# Patient Record
Sex: Male | Born: 1960 | Race: Asian | Hispanic: No | Marital: Single | Smoking: Former smoker
Health system: Southern US, Community
[De-identification: ages and names within clinical notes are randomized; demographics above are authoritative.]

## PROBLEM LIST (undated history)

## (undated) DIAGNOSIS — I77819 Aortic ectasia, unspecified site: Secondary | ICD-10-CM

## (undated) DIAGNOSIS — I428 Other cardiomyopathies: Secondary | ICD-10-CM

## (undated) DIAGNOSIS — M5126 Other intervertebral disc displacement, lumbar region: Secondary | ICD-10-CM

## (undated) DIAGNOSIS — D649 Anemia, unspecified: Secondary | ICD-10-CM

## (undated) DIAGNOSIS — M5416 Radiculopathy, lumbar region: Secondary | ICD-10-CM

## (undated) DIAGNOSIS — M545 Low back pain, unspecified: Secondary | ICD-10-CM

## (undated) DIAGNOSIS — Z9181 History of falling: Secondary | ICD-10-CM

## (undated) DIAGNOSIS — I5030 Unspecified diastolic (congestive) heart failure: Secondary | ICD-10-CM

## (undated) DIAGNOSIS — I10 Essential (primary) hypertension: Secondary | ICD-10-CM

## (undated) DIAGNOSIS — I4891 Unspecified atrial fibrillation: Secondary | ICD-10-CM

## (undated) DIAGNOSIS — R42 Dizziness and giddiness: Secondary | ICD-10-CM

## (undated) HISTORY — DX: Radiculopathy, lumbar region: M54.16

## (undated) HISTORY — DX: Other intervertebral disc displacement, lumbar region: M51.26

## (undated) HISTORY — DX: History of falling: Z91.81

## (undated) HISTORY — DX: Dizziness and giddiness: R42

## (undated) HISTORY — DX: Unspecified diastolic (congestive) heart failure: I50.30

## (undated) HISTORY — DX: Aortic ectasia, unspecified site: I77.819

## (undated) HISTORY — DX: Other cardiomyopathies: I42.8

---
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
## 2013-04-12 DIAGNOSIS — I872 Venous insufficiency (chronic) (peripheral): Secondary | ICD-10-CM | POA: Diagnosis present

## 2013-04-12 DIAGNOSIS — M545 Low back pain, unspecified: Secondary | ICD-10-CM | POA: Insufficient documentation

## 2013-04-12 DIAGNOSIS — M5442 Lumbago with sciatica, left side: Secondary | ICD-10-CM | POA: Insufficient documentation

## 2013-04-18 DIAGNOSIS — L209 Atopic dermatitis, unspecified: Secondary | ICD-10-CM | POA: Insufficient documentation

## 2013-07-12 DIAGNOSIS — Z9889 Other specified postprocedural states: Secondary | ICD-10-CM | POA: Insufficient documentation

## 2013-07-12 HISTORY — PX: COLONOSCOPY: SHX174

## 2014-02-11 DIAGNOSIS — M7710 Lateral epicondylitis, unspecified elbow: Secondary | ICD-10-CM | POA: Insufficient documentation

## 2015-06-27 DIAGNOSIS — D649 Anemia, unspecified: Secondary | ICD-10-CM | POA: Insufficient documentation

## 2017-11-23 DIAGNOSIS — Z8619 Personal history of other infectious and parasitic diseases: Secondary | ICD-10-CM | POA: Insufficient documentation

## 2018-03-20 DIAGNOSIS — N481 Balanitis: Secondary | ICD-10-CM | POA: Insufficient documentation

## 2018-03-31 DIAGNOSIS — G4726 Circadian rhythm sleep disorder, shift work type: Secondary | ICD-10-CM | POA: Insufficient documentation

## 2018-03-31 DIAGNOSIS — Z720 Tobacco use: Secondary | ICD-10-CM | POA: Insufficient documentation

## 2019-03-12 DIAGNOSIS — M62838 Other muscle spasm: Secondary | ICD-10-CM | POA: Insufficient documentation

## 2019-03-12 DIAGNOSIS — I4891 Unspecified atrial fibrillation: Secondary | ICD-10-CM | POA: Insufficient documentation

## 2019-03-12 DIAGNOSIS — R609 Edema, unspecified: Secondary | ICD-10-CM | POA: Insufficient documentation

## 2019-03-12 DIAGNOSIS — R600 Localized edema: Secondary | ICD-10-CM | POA: Insufficient documentation

## 2019-03-12 DIAGNOSIS — L853 Xerosis cutis: Secondary | ICD-10-CM | POA: Insufficient documentation

## 2019-03-12 DIAGNOSIS — Z7901 Long term (current) use of anticoagulants: Secondary | ICD-10-CM | POA: Insufficient documentation

## 2019-05-07 ENCOUNTER — Inpatient Hospital Stay (HOSPITAL_COMMUNITY): Payer: Self-pay

## 2019-05-07 ENCOUNTER — Other Ambulatory Visit: Payer: Self-pay

## 2019-05-07 ENCOUNTER — Inpatient Hospital Stay (HOSPITAL_COMMUNITY)
Admission: EM | Admit: 2019-05-07 | Discharge: 2019-05-10 | DRG: 308 | Disposition: A | Discharge status: Home / Self Care | Payer: Self-pay | Attending: Internal Medicine | Admitting: Internal Medicine

## 2019-05-07 ENCOUNTER — Encounter (HOSPITAL_COMMUNITY): Payer: Self-pay | Admitting: Emergency Medicine

## 2019-05-07 ENCOUNTER — Emergency Department (HOSPITAL_COMMUNITY): Payer: Self-pay

## 2019-05-07 DIAGNOSIS — R778 Other specified abnormalities of plasma proteins: Secondary | ICD-10-CM

## 2019-05-07 DIAGNOSIS — N179 Acute kidney failure, unspecified: Secondary | ICD-10-CM | POA: Diagnosis present

## 2019-05-07 DIAGNOSIS — Z7982 Long term (current) use of aspirin: Secondary | ICD-10-CM

## 2019-05-07 DIAGNOSIS — R7989 Other specified abnormal findings of blood chemistry: Secondary | ICD-10-CM

## 2019-05-07 DIAGNOSIS — E039 Hypothyroidism, unspecified: Secondary | ICD-10-CM | POA: Diagnosis present

## 2019-05-07 DIAGNOSIS — M542 Cervicalgia: Secondary | ICD-10-CM

## 2019-05-07 DIAGNOSIS — Z833 Family history of diabetes mellitus: Secondary | ICD-10-CM

## 2019-05-07 DIAGNOSIS — Z7901 Long term (current) use of anticoagulants: Secondary | ICD-10-CM

## 2019-05-07 DIAGNOSIS — G629 Polyneuropathy, unspecified: Secondary | ICD-10-CM | POA: Diagnosis present

## 2019-05-07 DIAGNOSIS — Z20822 Contact with and (suspected) exposure to covid-19: Secondary | ICD-10-CM | POA: Diagnosis present

## 2019-05-07 DIAGNOSIS — I872 Venous insufficiency (chronic) (peripheral): Secondary | ICD-10-CM | POA: Diagnosis present

## 2019-05-07 DIAGNOSIS — I248 Other forms of acute ischemic heart disease: Secondary | ICD-10-CM | POA: Diagnosis present

## 2019-05-07 DIAGNOSIS — I110 Hypertensive heart disease with heart failure: Secondary | ICD-10-CM | POA: Diagnosis present

## 2019-05-07 DIAGNOSIS — E785 Hyperlipidemia, unspecified: Secondary | ICD-10-CM | POA: Diagnosis present

## 2019-05-07 DIAGNOSIS — I10 Essential (primary) hypertension: Secondary | ICD-10-CM

## 2019-05-07 DIAGNOSIS — I4891 Unspecified atrial fibrillation: Secondary | ICD-10-CM | POA: Diagnosis present

## 2019-05-07 DIAGNOSIS — I5042 Chronic combined systolic (congestive) and diastolic (congestive) heart failure: Secondary | ICD-10-CM

## 2019-05-07 DIAGNOSIS — I480 Paroxysmal atrial fibrillation: Principal | ICD-10-CM | POA: Diagnosis present

## 2019-05-07 DIAGNOSIS — F17200 Nicotine dependence, unspecified, uncomplicated: Secondary | ICD-10-CM | POA: Diagnosis present

## 2019-05-07 DIAGNOSIS — J810 Acute pulmonary edema: Secondary | ICD-10-CM

## 2019-05-07 DIAGNOSIS — I340 Nonrheumatic mitral (valve) insufficiency: Secondary | ICD-10-CM

## 2019-05-07 DIAGNOSIS — Z9114 Patient's other noncompliance with medication regimen: Secondary | ICD-10-CM

## 2019-05-07 DIAGNOSIS — I4892 Unspecified atrial flutter: Secondary | ICD-10-CM | POA: Diagnosis present

## 2019-05-07 DIAGNOSIS — I361 Nonrheumatic tricuspid (valve) insufficiency: Secondary | ICD-10-CM

## 2019-05-07 DIAGNOSIS — I351 Nonrheumatic aortic (valve) insufficiency: Secondary | ICD-10-CM

## 2019-05-07 DIAGNOSIS — M25519 Pain in unspecified shoulder: Secondary | ICD-10-CM

## 2019-05-07 DIAGNOSIS — I5043 Acute on chronic combined systolic (congestive) and diastolic (congestive) heart failure: Secondary | ICD-10-CM | POA: Diagnosis present

## 2019-05-07 DIAGNOSIS — E876 Hypokalemia: Secondary | ICD-10-CM | POA: Diagnosis present

## 2019-05-07 HISTORY — DX: Essential (primary) hypertension: I10

## 2019-05-07 HISTORY — DX: Anemia, unspecified: D64.9

## 2019-05-07 HISTORY — DX: Unspecified atrial fibrillation: I48.91

## 2019-05-07 HISTORY — DX: Low back pain, unspecified: M54.50

## 2019-05-07 LAB — BASIC METABOLIC PANEL
Anion gap: 13 (ref 5–15)
BUN: 14 mg/dL (ref 6–20)
CO2: 18 mmol/L — ABNORMAL LOW (ref 22–32)
Calcium: 9.1 mg/dL (ref 8.9–10.3)
Chloride: 108 mmol/L (ref 98–111)
Creatinine, Ser: 1.1 mg/dL (ref 0.61–1.24)
GFR calc Af Amer: >60 mL/min (ref >60)
GFR calc non Af Amer: >60 mL/min (ref >60)
Glucose, Bld: 97 mg/dL (ref 70–99)
Potassium: 3.9 mmol/L (ref 3.5–5.1)
Sodium: 139 mmol/L (ref 135–145)

## 2019-05-07 LAB — TSH: TSH: 12.771 u[IU]/mL — ABNORMAL HIGH (ref 0.350–4.500)

## 2019-05-07 LAB — CBC
HCT: 41.1 % (ref 39.0–52.0)
Hemoglobin: 13.3 g/dL (ref 13.0–17.0)
MCH: 27.3 pg (ref 26.0–34.0)
MCHC: 32.4 g/dL (ref 30.0–36.0)
MCV: 84.2 fL (ref 80.0–100.0)
Platelets: 167 10*3/uL (ref 150–400)
RBC: 4.88 MIL/uL (ref 4.22–5.81)
RDW: 15.3 % (ref 11.5–15.5)
WBC: 9.2 10*3/uL (ref 4.0–10.5)
nRBC: 0 % (ref 0.0–0.2)

## 2019-05-07 LAB — BRAIN NATRIURETIC PEPTIDE: B Natriuretic Peptide: 733.8 pg/mL — ABNORMAL HIGH (ref 0.0–100.0)

## 2019-05-07 LAB — LIPID PANEL
Cholesterol: 163 mg/dL (ref 0–200)
HDL: 58 mg/dL (ref >40)
LDL Cholesterol: 88 mg/dL (ref 0–99)
Total CHOL/HDL Ratio: 2.8 RATIO
Triglycerides: 84 mg/dL (ref <150)
VLDL: 17 mg/dL (ref 0–40)

## 2019-05-07 LAB — HEPATIC FUNCTION PANEL
ALT: 36 U/L (ref 0–44)
AST: 38 U/L (ref 15–41)
Albumin: 4.1 g/dL (ref 3.5–5.0)
Alkaline Phosphatase: 90 U/L (ref 38–126)
Bilirubin, Direct: 0.3 mg/dL — ABNORMAL HIGH (ref 0.0–0.2)
Indirect Bilirubin: 0.9 mg/dL (ref 0.3–0.9)
Total Bilirubin: 1.2 mg/dL (ref 0.3–1.2)
Total Protein: 7.4 g/dL (ref 6.5–8.1)

## 2019-05-07 LAB — PROTIME-INR
INR: 1.1 (ref 0.8–1.2)
Prothrombin Time: 14.2 seconds (ref 11.4–15.2)

## 2019-05-07 LAB — TROPONIN I (HIGH SENSITIVITY)
Troponin I (High Sensitivity): 21 ng/L — ABNORMAL HIGH (ref <18)
Troponin I (High Sensitivity): 20 ng/L — ABNORMAL HIGH (ref <18)

## 2019-05-07 LAB — ECHOCARDIOGRAM COMPLETE
Height: 67 in
Weight: 2272 oz

## 2019-05-07 LAB — APTT: aPTT: 34 seconds (ref 24–36)

## 2019-05-07 LAB — RESPIRATORY PANEL BY RT PCR (FLU A&B, COVID)
Influenza A by PCR: NEGATIVE
Influenza B by PCR: NEGATIVE
SARS Coronavirus 2 by RT PCR: NEGATIVE

## 2019-05-07 LAB — HEPARIN LEVEL (UNFRACTIONATED): Heparin Unfractionated: >2.2 IU/mL — ABNORMAL HIGH (ref 0.30–0.70)

## 2019-05-07 LAB — T4, FREE: Free T4: 1.01 ng/dL (ref 0.61–1.12)

## 2019-05-07 LAB — LIPASE, BLOOD: Lipase: 50 U/L (ref 11–51)

## 2019-05-07 LAB — MAGNESIUM: Magnesium: 1.6 mg/dL — ABNORMAL LOW (ref 1.7–2.4)

## 2019-05-07 MED ORDER — ASPIRIN 81 MG PO CHEW
324.0000 mg | CHEWABLE_TABLET | Freq: Once | ORAL | Status: AC
  Administered 2019-05-07: 324 mg via ORAL
  Filled 2019-05-07: qty 4

## 2019-05-07 MED ORDER — DILTIAZEM HCL-DEXTROSE 125-5 MG/125ML-% IV SOLN (PREMIX)
5.0000 mg/h | INTRAVENOUS | Status: DC
  Administered 2019-05-07: 5 mg/h via INTRAVENOUS
  Filled 2019-05-07: qty 125

## 2019-05-07 MED ORDER — ASPIRIN EC 81 MG PO TBEC
81.0000 mg | DELAYED_RELEASE_TABLET | Freq: Every day | ORAL | Status: DC
  Administered 2019-05-07 – 2019-05-08 (×2): 81 mg via ORAL
  Filled 2019-05-07 (×2): qty 1

## 2019-05-07 MED ORDER — MAGNESIUM SULFATE 2 GM/50ML IV SOLN
2.0000 g | Freq: Once | INTRAVENOUS | Status: AC
  Administered 2019-05-07: 06:00:00 2 g via INTRAVENOUS
  Filled 2019-05-07: qty 50

## 2019-05-07 MED ORDER — FUROSEMIDE 20 MG PO TABS
20.0000 mg | ORAL_TABLET | Freq: Every day | ORAL | Status: DC
  Administered 2019-05-07: 20 mg via ORAL
  Filled 2019-05-07: qty 1

## 2019-05-07 MED ORDER — APIXABAN 5 MG PO TABS
5.0000 mg | ORAL_TABLET | Freq: Two times a day (BID) | ORAL | Status: DC
  Administered 2019-05-07 – 2019-05-10 (×7): 5 mg via ORAL
  Filled 2019-05-07 (×7): qty 1

## 2019-05-07 MED ORDER — SODIUM CHLORIDE 0.9% FLUSH: 3.0000 mL | Freq: Once | INTRAVENOUS | Status: DC

## 2019-05-07 MED ORDER — HEPARIN (PORCINE) 25000 UT/250ML-% IV SOLN
1000.0000 [IU]/h | INTRAVENOUS | Status: DC
  Administered 2019-05-07: 09:00:00 1000 [IU]/h via INTRAVENOUS
  Filled 2019-05-07: qty 250

## 2019-05-07 MED ORDER — FUROSEMIDE 10 MG/ML IJ SOLN
40.0000 mg | Freq: Once | INTRAMUSCULAR | Status: AC
  Administered 2019-05-07: 40 mg via INTRAVENOUS
  Filled 2019-05-07: qty 4

## 2019-05-07 MED ORDER — ACETAMINOPHEN 650 MG RE SUPP: 650.0000 mg | Freq: Four times a day (QID) | RECTAL | Status: DC | PRN

## 2019-05-07 MED ORDER — ACETAMINOPHEN 325 MG PO TABS
650.0000 mg | ORAL_TABLET | Freq: Four times a day (QID) | ORAL | Status: DC | PRN
  Administered 2019-05-07 – 2019-05-10 (×9): 650 mg via ORAL
  Filled 2019-05-07 (×9): qty 2

## 2019-05-07 MED ORDER — ATORVASTATIN CALCIUM 80 MG PO TABS
80.0000 mg | ORAL_TABLET | Freq: Every day | ORAL | Status: DC
  Administered 2019-05-07: 80 mg via ORAL
  Filled 2019-05-07: qty 1

## 2019-05-07 MED ORDER — KETOROLAC TROMETHAMINE 15 MG/ML IJ SOLN
15.0000 mg | Freq: Once | INTRAMUSCULAR | Status: AC
  Administered 2019-05-07: 15 mg via INTRAVENOUS
  Filled 2019-05-07: qty 1

## 2019-05-07 MED ORDER — DILTIAZEM LOAD VIA INFUSION
10.0000 mg | Freq: Once | INTRAVENOUS | Status: AC
  Administered 2019-05-07: 10 mg via INTRAVENOUS
  Filled 2019-05-07: qty 10

## 2019-05-07 MED ORDER — METOPROLOL SUCCINATE ER 100 MG PO TB24
100.0000 mg | ORAL_TABLET | Freq: Every day | ORAL | Status: DC
  Administered 2019-05-07: 100 mg via ORAL
  Filled 2019-05-07 (×2): qty 1

## 2019-05-07 MED ORDER — HYDROCHLOROTHIAZIDE 12.5 MG PO CAPS
12.5000 mg | ORAL_CAPSULE | Freq: Every day | ORAL | Status: DC
  Administered 2019-05-07: 12.5 mg via ORAL
  Filled 2019-05-07: qty 1

## 2019-05-07 NOTE — H&P (Addendum)
TRH H&P    Patient Demographics:    Seth Snyder, is a 59 y.o. male  MRN: [id number]  DOB - 01/24/1961  Admit Date - 05/07/2019  Referring MD/NP/PA: Harrie Jeans  Outpatient Primary MD for the patient is Patient, No Pcp Per  Patient coming from:  home  Chief complaint-  Chest pain   HPI:    Seth Snyder  is a 59 y.o. male,  w hypothyroidism, hypertension Pafib/Flutter , w hx of admission to [facility] 02/12/2019 for Afib/Flutter with RVR  Has been out of his bp medication since end of January and now presents with dyspnea x 1 week. Slight cough with white sputum. Slight palpitation and bloating, and also chest pain,  The chest pain is chronic and is burning and in the right upper chest/ shoulder.   Pt denies fever, chills, n/v, abd pain, diarrhea, brbpr, black stool, dysuria, hematuria.    In ED,  T 97.8, P 135, R 23, Bp 165/136  Pox 98% on RA Wt 64.4kg  CXR IMPRESSION: Mild cardiomegaly and pulmonary edema. Possible small pleural effusions.  TSH 12.771 Na 139, K 3.9,  Bun 14, Creatinine 1.10 Wbc 9.2, hgb 13.3, Plt 167 Trop  21 Magnesium 1.6  Ekg Afib at 130, nl axis, no st-t changes c/w ischemia  BNP 733.8  Reviewed cardiac echo 02/14/2019 EF 45-50%  LA moderate dilation 4.3cm Mild MR, mild AR, pulmonary artery pressure 40-84mmHg  Pt will be admitted for afib with RVR and also elevated troponin. Likely secondary to running out of his metoprolol.      Review of systems:    In addition to the HPI above,  No Fever-chills, No Headache, No changes with Vision or hearing, No problems swallowing food or Liquids,   No Abdominal pain, No Nausea or Vomiting, bowel movements are regular, No Blood in stool or Urine, No dysuria, No new skin rashes or bruises, No new joints pains-aches,  No new weakness, tingling, numbness in any extremity, No recent weight gain or loss, No  polyuria, polydypsia or polyphagia, No significant Mental Stressors.  All other systems reviewed and are negative.    Past History of the following :    Past Medical History:  Diagnosis Date  . Anemia   . Atrial fibrillation (HCC)   . Hypertension   . Low back pain       Past Surgical History:  Procedure Laterality Date  . COLONOSCOPY  07/12/2013      Social History:      Social History   Tobacco Use  . Smoking status: Current Every Day Smoker  . Smokeless tobacco: Never Used  Substance Use Topics  . Alcohol use: Not Currently       Family History :     Family History  Problem Relation Age of Onset  . Diabetes Mother        Home Medications:   Prior to Admission medications   Not on File     Allergies:    Not on File   Physical Exam:  Vitals  Blood pressure (!) 151/117, pulse 78, temperature 97.8 F (36.6 C), temperature source Oral, resp. rate (!) 21, height 5\' 7"  (1.702 m), weight 64.4 kg, SpO2 92 %.  1.  General: axoxo3  2. Psychiatric: euthymic  3. Neurologic: nonfocal  4. HEENMT:  Anicteric, pupils 1.27mm symmetric, direct, consensual, near intact Neck: borderline jvd  5. Respiratory : CTAB  6. Cardiovascular : Irr, irr, s1, s2, no m/g/r  7. Gastrointestinal:  Abd: soft, nt, nd, +bs  8. Skin:  Ext: no c/c/e, no rash  9.Musculoskeletal:  Good ROM     Data Review:    CBC Recent Labs  Lab 05/07/19 0430  WBC 9.2  HGB 13.3  HCT 41.1  PLT 167  MCV 84.2  MCH 27.3  MCHC 32.4  RDW 15.3   ------------------------------------------------------------------------------------------------------------------  Results for orders placed or performed during the hospital encounter of 05/07/19 (from the past 48 hour(s))  Basic metabolic panel     Status: Abnormal   Collection Time: 05/07/19  4:30 AM  Result Value Ref Range   Sodium 139 135 - 145 mmol/L   Potassium 3.9 3.5 - 5.1 mmol/L   Chloride 108 98 - 111 mmol/L    CO2 18 (L) 22 - 32 mmol/L   Glucose, Bld 97 70 - 99 mg/dL   BUN 14 6 - 20 mg/dL   Creatinine, Demetre 05/09/19 0.61 - 1.24 mg/dL   Calcium 9.1 8.9 - 9.23 mg/dL   GFR calc non Af Amer >60 >60 mL/min   GFR calc Af Amer >60 >60 mL/min   Anion gap 13 5 - 15    Comment: Performed at [facility], [address] [place], [place]  CBC     Status: None   Collection Time: 05/07/19  4:30 AM  Result Value Ref Range   WBC 9.2 4.0 - 10.5 K/uL   RBC 4.88 4.22 - 5.81 MIL/uL   Hemoglobin 13.3 13.0 - 17.0 g/dL   HCT 05/09/19 33.3 - 54.5 %   MCV 84.2 80.0 - 100.0 fL   MCH 27.3 26.0 - 34.0 pg   MCHC 32.4 30.0 - 36.0 g/dL   RDW 62.5 63.8 - 93.7 %   Platelets 167 150 - 400 K/uL   nRBC 0.0 0.0 - 0.2 %    Comment: Performed at [facility], [address], [place], [place]  Troponin I (High Sensitivity)     Status: Abnormal   Collection Time: 05/07/19  4:30 AM  Result Value Ref Range   Troponin I (High Sensitivity) 21 (H) <18 ng/L    Comment: (NOTE) Elevated high sensitivity troponin I (hsTnI) values and significant  changes across serial measurements may suggest ACS but many other  chronic and acute conditions are known to elevate hsTnI results.  Refer to the "Links" section for chest pain algorithms and additional  guidance. Performed at [facility], [address] [place], [place]   Brain natriuretic peptide     Status: Abnormal   Collection Time: 05/07/19  4:30 AM  Result Value Ref Range   B Natriuretic Peptide 733.8 (H) 0.0 - 100.0 pg/mL    Comment: Performed at [facility], [address] [place], [place]  TSH     Status: Abnormal   Collection Time: 05/07/19  4:30 AM  Result Value Ref Range   TSH 12.771 (H) 0.350 - 4.500 uIU/mL    Comment: Performed by a 3rd Generation assay  with a functional sensitivity of <=0.01 uIU/mL. Performed at [facility], [address], [place], [place] [address]   Magnesium     Status: Abnormal     Collection Time: 05/07/19  4:30 AM  Result Value Ref Range   Magnesium 1.6 (L) 1.7 - 2.4 mg/dL    Comment: Performed at [facility], [address], [place], [place] [address]    Chemistries  Recent Labs  Lab 05/07/19 0430  NA 139  K 3.9  CL 108  CO2 18*  GLUCOSE 97  BUN 14  CREATININE 1.10  CALCIUM 9.1  MG 1.6*   ------------------------------------------------------------------------------------------------------------------  ------------------------------------------------------------------------------------------------------------------ GFR: Estimated Creatinine Clearance: 67.5 mL/min (by C-G formula based on SCr of 1.1 mg/dL). Liver Function Tests: No results for input(s): AST, ALT, ALKPHOS, BILITOT, PROT, ALBUMIN in the last 168 hours. No results for input(s): LIPASE, AMYLASE in the last 168 hours. No results for input(s): AMMONIA in the last 168 hours. Coagulation Profile: No results for input(s): INR, PROTIME in the last 168 hours. Cardiac Enzymes: No results for input(s): CKTOTAL, CKMB, CKMBINDEX, TROPONINI in the last 168 hours. BNP (last 3 results) No results for input(s): PROBNP in the last 8760 hours. HbA1C: No results for input(s): HGBA1C in the last 72 hours. CBG: No results for input(s): GLUCAP in the last 168 hours. Lipid Profile: No results for input(s): CHOL, HDL, LDLCALC, TRIG, CHOLHDL, LDLDIRECT in the last 72 hours. Thyroid Function Tests: Recent Labs    05/07/19 0430  TSH 12.771*   Anemia Panel: No results for input(s): VITAMINB12, FOLATE, FERRITIN, TIBC, IRON, RETICCTPCT in the last 72 hours.  --------------------------------------------------------------------------------------------------------------- Urine analysis: No results found for: COLORURINE, APPEARANCEUR, LABSPEC, PHURINE, GLUCOSEU, HGBUR, BILIRUBINUR, KETONESUR, PROTEINUR, UROBILINOGEN, NITRITE, LEUKOCYTESUR    Imaging Results:    DG Chest Portable 1 View  Result  Date: 05/07/2019 CLINICAL DATA:  Chest pain. Right-sided pain radiating down right arm. Shortness of breath. EXAM: PORTABLE CHEST 1 VIEW COMPARISON:  None. FINDINGS: Mild cardiomegaly with normal mediastinal contours. Mild pulmonary edema with Kerley B-lines. Possible small pleural effusions. No confluent airspace disease. No pneumothorax. No acute osseous abnormalities are seen. IMPRESSION: Mild cardiomegaly and pulmonary edema. Possible small pleural effusions. Electronically Signed   By: Narda Rutherford M.D.   On: 05/07/2019 04:50       Assessment & Plan:    Principal Problem:   Atrial fibrillation with RVR (HCC) Active Problems:   Hypertension   Hypothyroidism   Hypomagnesemia   Elevated troponin  Afib/ Flutter w RVR Tele Trop I q2h x2 Check cardiac echo Cardizem GTT Heparin GTT Cardiology consult placed in computer to [facility] It sound like even though he was Chadsvasc2=1 previously pt was placed on Eliquis at [place] due to ? Anticipation of possible ablation in the future.   Old records of his admission given to his nurse to go up with patient.   Elevated trop I Cycle cardiac markers Check lipid Aspirin Start Lipitor 80mg  po qhs  ? Mild CHF (EF 45%) Lasix 40mg  iv x1 given in ED Monitor daily weight, strict I/O Start lasix 20mg  po qday Check cmp in am  Hypothyroidism Please start synthroid once hr improved  Hypomagnesemia repleted in ED Check magnesium in am    DVT Prophylaxis-   Heparin - SCDs   AM Labs Ordered, also please review Full Orders  Family Communication: Admission, patients condition and plan of care including tests being ordered have been discussed with the patient who indicate understanding and agree  with the plan and Code Status.  Code Status:  FULL CODE per patient  Admission status: inpatient: Based on patients clinical presentation and evaluation of above clinical data, I have made determination that patient meets Inpatient criteria at this  time.   Pt has afib/ flutter with RVR, w elevated troponin, and will require iv cardizem gtt and cardiology evaluation.  Pt will be placed on iv heparin as well for elveation troponin. Pt has high risk of clinical deterioration, pt will require >2 nites stay.   Time spent in minutes : 60 minutes critical care    Jani Gravel M.D on 05/07/2019 at 6:29 AM

## 2019-05-07 NOTE — Discharge Instructions (Signed)

## 2019-05-07 NOTE — ED Triage Notes (Signed)
Pt from home c/o right side chest pain radiation down right arm. Other S/S: SOB, pain worsens with movement and deep breathing.

## 2019-05-07 NOTE — Progress Notes (Signed)
ANTICOAGULATION CONSULT NOTE  Pharmacy Consult for Heparin >> Eliquis Indication: atrial fibrillation  No Known Allergies  Patient Measurements: Height: 5\' 7"  (170.2 cm) Weight: 142 lb (64.4 kg) IBW/kg (Calculated) : 66.1  Vital Signs: Temp: 97.9 F (36.6 C) (02/15 0958) Temp Source: Oral (02/15 0958) BP: 131/100 (02/15 0958) Pulse Rate: 78 (02/15 0958)  Labs: Recent Labs    05/07/19 0430 05/07/19 0822 05/07/19 0823  HGB 13.3  --   --   HCT 41.1  --   --   PLT 167  --   --   APTT  --   --  34  LABPROT  --   --  14.2  INR  --   --  1.1  HEPARINUNFRC  --   --  >2.20*  CREATININE 1.10  --   --   TROPONINIHS 21* 20*  --     Estimated Creatinine Clearance: 67.5 mL/min (by C-G formula based on SCr of 1.1 mg/dL).   Assessment: 59 y.o. male on Eliquis PTA for Afib and recent cardioversion (CHADsVASc = 1).  Patient was transitioned to IV heparin due to chest pain, and now to resume Eliquis with plan for cardioversion outpatient.  No bleeding reported.  Goal of Therapy:  Appropriate anticoagulation Monitor platelets by anticoagulation protocol: Yes   Plan:  Eliquis 5mg  PO BID - stop heparin when Eliquis is administered Pharmacy will sign off and follow peripherally.  Thank you for the consult!  Tayjon Halladay D. 60, PharmD, BCPS, BCCCP 05/07/2019, 2:40 PM

## 2019-05-07 NOTE — ED Provider Notes (Addendum)
TIME SEEN: 4:54 AM  CHIEF COMPLAINT: Right-sided chest pain and shortness of breath  HPI: Patient is a 59 year old male with history of hypertension and likely atrial fibrillation on metoprolol and Eliquis who presents to the emergency department with a month and a half of right-sided burning chest pain that radiates down the right arm and shortness of breath.  It appears patient was admitted to [facility] in [place] 02/12/19 to 02/17/19 for similar symptoms.  He has records that show electrocardioversion but he is unable to tell me for what.  He is a native him if he has had any history of arrhythmia.  He is unable to tell me if he had a cardiac catheterization or echocardiogram.  States he ran out of metoprolol about a month ago.  He states he has been compliant with his Eliquis.  He states symptoms worsened tonight.  He does not wear oxygen chronically.  Reports he has felt "feverish".  No cough.  No sick contacts or Covid exposures.  No calf swelling or calf tenderness.  Denies history of CHF, COPD, PE or DVT.  No aggravating or alleviating factors to his symptoms.  States he used to drink alcohol occasionally but stopped 3 months ago after he was admitted to the hospital.  [place] interpreter used.  ROS: See HPI, history limited secondary to needing interpreter Constitutional: no fever  Eyes: no drainage  ENT: no runny nose   Cardiovascular:   chest pain  Resp: SOB  GI: no vomiting GU: no dysuria Integumentary: no rash  Allergy: no hives  Musculoskeletal: no leg swelling  Neurological: no slurred speech ROS otherwise negative  PAST MEDICAL HISTORY/PAST SURGICAL HISTORY:  Past Medical History:  Diagnosis Date  . Hypertension     MEDICATIONS:  Prior to Admission medications   Not on File    ALLERGIES:  Not on File  SOCIAL HISTORY:  Social History   Tobacco Use  . Smoking status: Not on file  Substance Use Topics  . Alcohol use: Not on file    FAMILY  HISTORY: No family history on file.  EXAM: BP (!) 159/130   Pulse (!) 45   Temp 97.8 F (36.6 C) (Oral)   Resp 17   Ht 5\' 7"  (1.702 m)   Wt 64.4 kg   SpO2 98%   BMI 22.24 kg/m  CONSTITUTIONAL: Alert and oriented and responds appropriately to questions.  In no acute distress.  Afebrile here. HEAD: Normocephalic EYES: Conjunctivae clear, pupils appear equal, EOM appear intact, no conjunctival pallor ENT: normal nose; moist mucous membranes NECK: Supple, normal ROM CARD: Irregularly irregular and tachycardic; S1 and S2 appreciated; no murmurs, no clicks, no rubs, no gallops RESP: Normal chest excursion without splinting or tachypnea; breath sounds clear and equal bilaterally; no wheezes, no rhonchi, no rales, no hypoxia or respiratory distress, speaking full sentences ABD/GI: Normal bowel sounds; non-distended; soft, non-tender, no rebound, no guarding, no peritoneal signs, no hepatosplenomegaly BACK:  The back appears normal EXT: Normal ROM in all joints; no deformity noted, no edema; no cyanosis, no calf swelling or calf tenderness on exam SKIN: Normal color for age and race; warm; no rash on exposed skin NEURO: Moves all extremities equally PSYCH: The patient's mood and manner are appropriate.   MEDICAL DECISION MAKING: Patient here with A. fib with RVR.  Chest x-ray shows pulmonary edema likely the cause of his shortness of breath.  Will attempt to obtain records from [facility] in [place].  He does not have a  local PCP or cardiologist.  It appears he is on hydrochlorothiazide, metoprolol, Eliquis.  States he is out of metoprolol for the past month reports compliance with his Eliquis.  History however very limited secondary to having to use [place] interpreter.  He is hypertensive here as well.  I feel patient will need blood pressure control, rate control and diuresis.  I feel he will be better served in the hospital.  I do not feel he is a candidate for electrocardioversion  in the ED at this time given I do not feel comfortable confirming that he has not missed any Eliquis given the language barrier.  Patient and son comfortable with plan for admission.  Cardiac labs pending.  Will start diltiazem infusion.  ED PROGRESS: Troponin is 21.  BNP elevated at 733.  Potassium normal.  Magnesium low at 1.6.  Will give IV replacement.  Will give 40 mg of IV Lasix here.  Diltiazem bolus and infusion will be started.  Will discuss with medicine for admission.  Rapid Covid swab is pending in case patient needs urgent cardioversion today.  5:55 AM Discussed patient's case with hospitalist, Dr. Selena Batten.  I have recommended admission and patient (and family if present) agree with this plan. Admitting physician will place admission orders.   I reviewed all nursing notes, vitals, pertinent previous records and interpreted all EKGs, lab and urine results, imaging (as available).      EKG Interpretation  Date/Time:  Monday May 07 2019 04:29:27 EST Ventricular Rate:  127 PR Interval:    QRS Duration: 99 QT Interval:  314 QTC Calculation: 457 R Axis:   86 Text Interpretation: Atrial fibrillation RSR' in V1 or V2, right VCD or RVH Left ventricular hypertrophy No old tracing to compare Confirmed by Rovena Hearld, Baxter Hire [phone]) on 05/07/2019 4:47:28 AM        CRITICAL CARE Performed by: Baxter Hire [place]   Total critical care time: 55 minutes  Critical care time was exclusive of separately billable procedures and treating other patients.  Critical care was necessary to treat or prevent imminent or life-threatening deterioration.  Critical care was time spent personally by me on the following activities: development of treatment plan with patient and/or surrogate as well as nursing, discussions with consultants, evaluation of patient's response to treatment, examination of patient, obtaining history from patient or surrogate, ordering and performing treatments and interventions,  ordering and review of laboratory studies, ordering and review of radiographic studies, pulse oximetry and re-evaluation of patient's condition.   Aniel Marquis Down was evaluated in Emergency Department on 05/07/2019 for the symptoms described in the history of present illness. He was evaluated in the context of the global COVID-19 pandemic, which necessitated consideration that the patient might be at risk for infection with the SARS-CoV-2 virus that causes COVID-19. Institutional protocols and algorithms that pertain to the evaluation of patients at risk for COVID-19 are in a state of rapid change based on information released by regulatory bodies including the CDC and federal and state organizations. These policies and algorithms were followed during the patient's care in the ED.  Patient was seen wearing N95, face shield, gloves.    Gaylon Bentz, Layla Maw, DO 05/07/19 [phone]   Nurse reports patient complaining of right upper quadrant abdominal pain.  Exam benign on my evaluation.  Will add on LFTs, lipase.  No ascites on abdominal exam.  No distention.    Estelle Skibicki, Layla Maw, DO 05/07/19 [phone]

## 2019-05-07 NOTE — ED Notes (Signed)
Lab confirmed to result MG, TSH, and BNP from previously collected blood work

## 2019-05-07 NOTE — Consult Note (Signed)
Cardiology Consultation:   Patient ID: Kiegan Jaedyn Lard MRN: [id number]; DOB: [birth year]  Admit date: 05/07/2019 Date of Consult: 05/07/2019  Primary Care Provider: Patient, No Pcp Per Primary Cardiologist: Chilton Si, MD - New Primary Electrophysiologist:  None    Patient Profile:   Joshau Derrien Anschutz is a 59 y.o. male with a hx of paroxysmal atrial fibrillation/flutter diagnosed in December, hypothyroidism, lower extremity venous insufficiency, essential hypertension, anemia, tobacco abuse who is being seen today for the evaluation of atrial fibrillation at the request of Dr. Alvino Chapel.  History of Present Illness:   Mr. Danielski presented to the hospital with dyspnea over the last week.  He was reportedly out of his blood pressure medication since the end of January.  He was having complaints of chest pain that was chronic and burning in the right upper chest/shoulder. Review of notes from [facility] in care everywhere, it was noted that the patient was admitted to [facility] 02/12/2019-02/17/2019 for hypertensive emergency and found to have A. fib/flutter.  He was started on Eliquis 5 mg twice daily, aspirin 81 mg and metoprolol ER 100 mg.  He was seen in his PCP office for follow-up on 03/12/2019 at which time he was apparently stable and was noted to be planning to move to [facility].  I do not see any cardiology notes in care everywhere.  Today he tells me that he was seen by cardiology in [place] and under went TEE and DCCV. He is unaware of having any stress test or cath. He has run out of his metoprolol, HCTZ and aspirin as of late January. He tells me that he has been compliant with his Eliquis BID, not missing any doses but I am not sure that he actually understands. He present to the hospital at about 3 am due to worsening shortness of breath and also right chest/shoulder pain. He has also had difficulty breathing at night for about 1-2 weeks. He denies  edema and none is present today. He is most concerned about the pain which is burning in nature, in the right upper chest, right shoulder and into the back and goes down his right arm with burning and numbness of the right 4th finger. His pain is worse when I push on his shoulder joint and when I raise his right arm. It is also worse when he takes a deep breath. It has been constant for the last 2 months and makes it hard for him to get out of the bed. His breathing is better today but the right shoulder pain is still present.  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 He used to smoke but quit in October. He denies alcohol use. He is not aware on any thyroid issues.    In the ER EKG showed atrial fibrillation at 130 bpm with no ischemic changes.  BNP was 733.8.  Chest x-ray showed mild cardiomegaly and pulmonary edema, possible small pleural effusions.  TSH was elevated at 12.771.  High-sensitivity troponins are very mildly elevated and flat pattern: 21, 20  Per primary team note echocardiogram was done in 02/14/2019 with EF 45-50%, moderately dilated left atrium, mild MR, mild AR, pulmonary artery pressure 40-45 mmHg.  Heart Pathway Score:     Past Medical History:  Diagnosis Date  . Anemia   . Atrial fibrillation (HCC)   . Hypertension   . Low back pain     Past Surgical History:  Procedure Laterality Date  . COLONOSCOPY  07/12/2013  Home Medications:  Prior to Admission medications   Medication Sig Start Date End Date Taking? Authorizing Provider  acetaminophen (TYLENOL) 500 MG tablet Take 500 mg by mouth daily as needed for mild pain.   Yes [provider]  apixaban (ELIQUIS) 5 MG TABS tablet Take 5 mg by mouth 2 (two) times daily.   Yes [provider]  aspirin EC 81 MG tablet Take 81 mg by mouth daily.   Yes [provider]  hydrochlorothiazide (MICROZIDE) 12.5 MG capsule Take 12.5 mg by mouth daily.   Yes [provider]    methocarbamol (ROBAXIN) 500 MG tablet Take 500 mg by mouth at bedtime as needed for muscle spasms.   Yes [provider]  metoprolol succinate (TOPROL-XL) 100 MG 24 hr tablet Take 100 mg by mouth daily. Take with or immediately following a meal.   Yes [provider]    Inpatient Medications: Scheduled Meds: . aspirin EC  81 mg Oral Daily  . atorvastatin  80 mg Oral q1800  . sodium chloride flush  3 mL Intravenous Once   Continuous Infusions: . diltiazem (CARDIZEM) infusion 5 mg/hr (05/07/19 0545)  . heparin 1,000 Units/hr (05/07/19 0850)   PRN Meds: acetaminophen **OR** acetaminophen  Allergies:   No Known Allergies  Social History:   Social History   Socioeconomic History  . Marital status: Single    Spouse name: Not on file  . Number of children: Not on file  . Years of education: Not on file  . Highest education level: Not on file  Occupational History  . Not on file  Tobacco Use  . Smoking status: Current Every Day Smoker  . Smokeless tobacco: Never Used  Substance and Sexual Activity  . Alcohol use: Not Currently  . Drug use: Not on file  . Sexual activity: Not on file  Other Topics Concern  . Not on file  Social History Narrative  . Not on file   Social Determinants of Health   Financial Resource Strain:   . Difficulty of Paying Living Expenses: Not on file  Food Insecurity:   . Worried About Charity fundraiser in the Last Year: Not on file  . Ran Out of Food in the Last Year: Not on file  Transportation Needs:   . Lack of Transportation (Medical): Not on file  . Lack of Transportation (Non-Medical): Not on file  Physical Activity:   . Days of Exercise per Week: Not on file  . Minutes of Exercise per Session: Not on file  Stress:   . Feeling of Stress : Not on file  Social Connections:   . Frequency of Communication with Friends and Family: Not on file  . Frequency of Social Gatherings with Friends and Family: Not on file  .  Attends Religious Services: Not on file  . Active Member of Clubs or Organizations: Not on file  . Attends Archivist Meetings: Not on file  . Marital Status: Not on file  Intimate Partner Violence:   . Fear of Current or Ex-Partner: Not on file  . Emotionally Abused: Not on file  . Physically Abused: Not on file  . Sexually Abused: Not on file    Family History:    Family History  Problem Relation Age of Onset  . Diabetes Mother   . Diabetes Sister   . Mental illness Sister   . Mental illness Brother      ROS:  Please see the history of present illness.  All other ROS reviewed and negative.     Physical Exam/Data:   Vitals:   05/07/19 0615 05/07/19 0645 05/07/19 0700 05/07/19 0958  BP: (!) 151/117 (!) 148/117 (!) 154/109 (!) 131/100  Pulse: 78 82 60 78  Resp: (!) 21 18 20 20   Temp:    97.9 F (36.6 C)  TempSrc:    Oral  SpO2: 92% 93% 91% 98%  Weight:      Height:        Intake/Output Summary (Last 24 hours) at 05/07/2019 1324 Last data filed at 05/07/2019 0844 Gross per 24 hour  Intake 95 ml  Output 725 ml  Net -630 ml   Last 3 Weights 05/07/2019  Weight (lbs) 142 lb  Weight (kg) 64.411 kg     Body mass index is 22.24 kg/m.  General:  Well nourished, well developed, in no acute distress HEENT: normal Lymph: no adenopathy Neck: no JVD Vascular: No carotid bruits; FA pulses 2+ bilaterally  Cardiac:  normal S1, S2; Irregularly irregular rhythm; no murmur  Lungs:  clear to auscultation bilaterally, no wheezing, rhonchi or rales  Abd: soft, nontender, no hepatomegaly  Ext: no edema Musculoskeletal:  No deformities, BUE and BLE strength normal and equal, tenderness to right shoulder with decreased ROM due to pain Skin: warm and dry  Neuro:  CNs 2-12 intact, no focal abnormalities noted Psych:  Normal affect   EKG:  The EKG was personally reviewed and demonstrates: Atrial fibrillation/flutter, 127 bpm Telemetry:  Telemetry was personally reviewed  and demonstrates:  Atrial fibrillation in the 80's-90's  Relevant CV Studies:  Echo pending  Laboratory Data:  High Sensitivity Troponin:   Recent Labs  Lab 05/07/19 0430 05/07/19 0822  TROPONINIHS 21* 20*     Chemistry Recent Labs  Lab 05/07/19 0430  NA 139  K 3.9  CL 108  CO2 18*  GLUCOSE 97  BUN 14  CREATININE 1.10  CALCIUM 9.1  GFRNONAA >60  GFRAA >60  ANIONGAP 13    Recent Labs  Lab 05/07/19 0430  PROT 7.4  ALBUMIN 4.1  AST 38  ALT 36  ALKPHOS 90  BILITOT 1.2   Hematology Recent Labs  Lab 05/07/19 0430  WBC 9.2  RBC 4.88  HGB 13.3  HCT 41.1  MCV 84.2  MCH 27.3  MCHC 32.4  RDW 15.3  PLT 167   BNP Recent Labs  Lab 05/07/19 0430  BNP 733.8*    DDimer No results for input(s): DDIMER in the last 168 hours.   Radiology/Studies:  DG Chest Portable 1 View  Result Date: 05/07/2019 CLINICAL DATA:  Chest pain. Right-sided pain radiating down right arm. Shortness of breath. EXAM: PORTABLE CHEST 1 VIEW COMPARISON:  None. FINDINGS: Mild cardiomegaly with normal mediastinal contours. Mild pulmonary edema with Kerley B-lines. Possible small pleural effusions. No confluent airspace disease. No pneumothorax. No acute osseous abnormalities are seen. IMPRESSION: Mild cardiomegaly and pulmonary edema. Possible small pleural effusions. Electronically Signed   By: 05/09/2019 M.D.   On: 05/07/2019 04:50         Assessment and Plan:   Paroxysmal atrial fibrillation with RVR -Pt initially diagnoses with afib/flutter in November while living in [place]. Was put on apixaban and metoprolol and underwent TEE/DCCV. Has moved to [facility] and has been out of his meds. Now presenting with shortness of breath. -Patient has been started on diltiazem drip, now infusing at 5 mg/h. Maintaining SR in the 80's-90's. -Magnesium was low at 1.6, being replaced. -CHA2DS2/VAS Stroke Risk  Score is at least 1 (HTN). Pt was on ELiquis and states that he had not missed any doses.  Patient is currently on IV heparin for anticoagulation. -Would restart his home metoprolol for rate control. Switch him to Eliquis. Plan to follow up in our office and consider cardioversion if continues in afib.  -I called [facility] to try to obtain records and was told that there were no cardiology records and no occurrences for the stated time.   Acute on chronic combined systolic and diastolic heart failure -Pt with shortness of breath and orthopnea. No edema. He currently does not appear volume overloaded.  -Possibly related to tachyarrhythmia. -EF 45-50% by echo in 01/2019 in [place]. -BNP elevated at 733.8. -Chest x-ray with mild cardiomegaly and pulmonary edema.  Possible small pleural effusions. -Will check echocardiogram, already ordered by primary team.  -Patient has been given Lasix 40 mg IV early this morning and now is ordered Lasix 20 mg p.o. daily. Will switch him to his usual home HCTZ and consider starting losartan if EF down on today's echo.  -Breathing is better.  -Monitor strict intake and output and daily weights.  Chest pain -Pt's pain in his right upper chest/back, shoulder and down right arm described as burning and has been constant for the last 2 months. Pain is worse with movement, breathing and palpation. Likely musculoskeletal/nerve pain. No typical chest pain.    Hypertension -At home he was on metoprolol and HCTZ which has had run out of in late January.  -Currently only on IV diltiazem and Lasix. -Blood pressure is elevated 130s-150s/100s-110s. -As above, will resume his home HCTZ and metoprolol.   Hypothyroidism -TSH elevated at 12.771 with normal Free T4, 1.01.  -Management per primary team.  Plans to start Synthroid once heart rate improves.  Hyperlipidemia  -Has been started on atorvastatin 80 mg daily due to mildly elevated troponin.  -Lipid panel: TC 163, HDL 58, LDL 88, triglycerides 84.   -Unclear whether he needs statin  unless we obtain some records that indicate CAD.      For questions or updates, please contact [facility] Please consult [email or web address] for contact info under     Signed, Berton Bon, NP  05/07/2019 1:24 PM

## 2019-05-07 NOTE — Progress Notes (Signed)
ANTICOAGULATION CONSULT NOTE - Initial Consult  Pharmacy Consult for Heparin Indication: atrial fibrillation  No Known Allergies  Patient Measurements: Height: 5\' 7"  (170.2 cm) Weight: 142 lb (64.4 kg) IBW/kg (Calculated) : 66.1  Vital Signs: Temp: 97.8 F (36.6 C) (02/15 0432) Temp Source: Oral (02/15 0432) BP: 154/109 (02/15 0700) Pulse Rate: 60 (02/15 0700)  Labs: Recent Labs    05/07/19 0430  HGB 13.3  HCT 41.1  PLT 167  CREATININE 1.10  TROPONINIHS 21*    Estimated Creatinine Clearance: 67.5 mL/min (by C-G formula based on SCr of 1.1 mg/dL).   Medical History: Past Medical History:  Diagnosis Date  . Anemia   . Atrial fibrillation (HCC)   . Hypertension   . Low back pain     Medications:  No current facility-administered medications on file prior to encounter.   Current Outpatient Medications on File Prior to Encounter  Medication Sig Dispense Refill  . acetaminophen (TYLENOL) 500 MG tablet Take 500 mg by mouth daily as needed for mild pain.    05/09/19 apixaban (ELIQUIS) 5 MG TABS tablet Take 5 mg by mouth 2 (two) times daily.    Marland Kitchen aspirin EC 81 MG tablet Take 81 mg by mouth daily.    . hydrochlorothiazide (MICROZIDE) 12.5 MG capsule Take 12.5 mg by mouth daily.    . methocarbamol (ROBAXIN) 500 MG tablet Take 500 mg by mouth at bedtime as needed for muscle spasms.    . metoprolol succinate (TOPROL-XL) 100 MG 24 hr tablet Take 100 mg by mouth daily. Take with or immediately following a meal.       Assessment: 59 y.o. male admitted with chest pain, h/o AFib and Eliquis on hold, for heparin.  Last dose of Eliquis  7pm 2/15   Goal of Therapy:  APTT 66-102 sec Heparin level 0.3-0.7 units/ml Monitor platelets by anticoagulation protocol: Yes   Plan:  Start heparin 1000 units/hr APTT in 8 hours  Nikolaus Pienta, 3/15 05/07/2019,7:57 AM

## 2019-05-07 NOTE — Progress Notes (Signed)
  Echocardiogram 2D Echocardiogram has been performed.  Seth Snyder 05/07/2019, 2:38 PM

## 2019-05-07 NOTE — Progress Notes (Signed)
  PROGRESS NOTE  Patient admitted earlier this morning. See H&P. Seth Snyder is a 59 yo male with past medical history significant for paroxysmal atrial fibrillation, hypothyroidism, hypertension who was diagnosed with A. fib at [facility] in Nov 2020.  During that admission, patient underwent cardioversion and was started on baby aspirin, Eliquis, metoprolol. He ended up moving to [place] from [place] in late December and has not established with physicians in town yet. He recently ran out of his medications including metoprolol, hydrochlorothiazide and aspirin.  He reports that he has been taking Eliquis.   Patient seen and examined.  Remains in a regular rhythm with rate 90s.  On room air without respiratory distress.  A/P:   A. fib RVR -Ran out of his home medications including Toprol, aspirin.  Has been on Eliquis as an outpatient -Remains on Cardizem drip -IV heparin drip -Echocardiogram pending -Cardiology consulted  Hypomagnesemia -Replace, trend   Noralee Stain, DO [facility] 05/07/2019, 1:07 PM  Available via Epic secure chat 7am-7pm After these hours, please refer to coverage provider listed on [email or web address]

## 2019-05-08 LAB — CBC
HCT: 41.2 % (ref 39.0–52.0)
Hemoglobin: 14 g/dL (ref 13.0–17.0)
MCH: 27.4 pg (ref 26.0–34.0)
MCHC: 34 g/dL (ref 30.0–36.0)
MCV: 80.6 fL (ref 80.0–100.0)
Platelets: 195 10*3/uL (ref 150–400)
RBC: 5.11 MIL/uL (ref 4.22–5.81)
RDW: 14.9 % (ref 11.5–15.5)
WBC: 6.2 10*3/uL (ref 4.0–10.5)
nRBC: 0 % (ref 0.0–0.2)

## 2019-05-08 LAB — COMPREHENSIVE METABOLIC PANEL
ALT: 33 U/L (ref 0–44)
AST: 33 U/L (ref 15–41)
Albumin: 3.8 g/dL (ref 3.5–5.0)
Alkaline Phosphatase: 96 U/L (ref 38–126)
Anion gap: 11 (ref 5–15)
BUN: 19 mg/dL (ref 6–20)
CO2: 24 mmol/L (ref 22–32)
Calcium: 9.2 mg/dL (ref 8.9–10.3)
Chloride: 103 mmol/L (ref 98–111)
Creatinine, Ser: 1.43 mg/dL — ABNORMAL HIGH (ref 0.61–1.24)
GFR calc Af Amer: >60 mL/min (ref >60)
GFR calc non Af Amer: 54 mL/min — ABNORMAL LOW (ref >60)
Glucose, Bld: 104 mg/dL — ABNORMAL HIGH (ref 70–99)
Potassium: 3 mmol/L — ABNORMAL LOW (ref 3.5–5.1)
Sodium: 138 mmol/L (ref 135–145)
Total Bilirubin: 1.7 mg/dL — ABNORMAL HIGH (ref 0.3–1.2)
Total Protein: 7.2 g/dL (ref 6.5–8.1)

## 2019-05-08 LAB — MAGNESIUM: Magnesium: 1.9 mg/dL (ref 1.7–2.4)

## 2019-05-08 LAB — HIV ANTIBODY (ROUTINE TESTING W REFLEX): HIV Screen 4th Generation wRfx: NONREACTIVE

## 2019-05-08 MED ORDER — GABAPENTIN 100 MG PO CAPS
100.0000 mg | ORAL_CAPSULE | Freq: Two times a day (BID) | ORAL | Status: DC | PRN
  Administered 2019-05-08 – 2019-05-10 (×3): 100 mg via ORAL
  Filled 2019-05-08 (×3): qty 1

## 2019-05-08 MED ORDER — KETOROLAC TROMETHAMINE 30 MG/ML IJ SOLN
30.0000 mg | Freq: Once | INTRAMUSCULAR | Status: AC
  Administered 2019-05-08: 30 mg via INTRAVENOUS
  Filled 2019-05-08: qty 1

## 2019-05-08 MED ORDER — FUROSEMIDE 20 MG PO TABS
20.0000 mg | ORAL_TABLET | Freq: Every day | ORAL | Status: DC
  Administered 2019-05-08: 11:00:00 20 mg via ORAL
  Filled 2019-05-08: qty 1

## 2019-05-08 MED ORDER — METOPROLOL SUCCINATE ER 100 MG PO TB24
100.0000 mg | ORAL_TABLET | Freq: Every day | ORAL | Status: DC
  Administered 2019-05-08 – 2019-05-09 (×2): 100 mg via ORAL
  Filled 2019-05-08 (×2): qty 1

## 2019-05-08 MED ORDER — POTASSIUM CHLORIDE CRYS ER 20 MEQ PO TBCR
40.0000 meq | EXTENDED_RELEASE_TABLET | ORAL | Status: AC
  Administered 2019-05-08 (×2): 40 meq via ORAL
  Filled 2019-05-08 (×2): qty 2

## 2019-05-08 NOTE — Progress Notes (Addendum)
PROGRESS NOTE    Seth Snyder  WSF:[id number] DOB: 03-31-1960 DOA: 05/07/2019 PCP: Patient, No Pcp Per     Brief Narrative:  Seth Snyder is a 59 yo male with past medical history significant for paroxysmal atrial fibrillation, hypothyroidism, hypertension who was diagnosed with A. fib at [facility] in Nov 2020.  During that admission, patient underwent cardioversion and was started on baby aspirin, Eliquis, metoprolol. He ended up moving to [place] from [place] in late December and has not established with physicians in town yet. He recently ran out of his medications including metoprolol, hydrochlorothiazide and aspirin.  He reports that he has been taking Eliquis.  Cardiology consulted for A. fib RVR.  New events last 24 hours / Subjective: Continues to have right-sided shoulder pain, no chest pain.  Admits to some shortness of breath.  Assessment & Plan:   Principal Problem:   Atrial fibrillation with RVR (HCC) Active Problems:   Hypertension   Hypothyroidism   Hypomagnesemia   Elevated troponin   Acute pulmonary edema (HCC)   A. fib RVR -Ran out of his home medications including Toprol, aspirin as outpatient.  Has been on Eliquis as an outpatient -Free T4 normal -Echocardiogram revealed EF 45 to 50%, global hypokinesis left ventricle -Cardiology following -Continue Eliquis, metoprolol.  Stop aspirin -Plan for outpatient cardioversion in several weeks -Remains in A. fib, rate better controlled  Chronic systolic and diastolic heart failure -BNP 733.8 -Continue Lasix  AKI -Creatinine bumped up overnight 1.43 -Stop hydrochlorothiazide -Continue to monitor creatinine on Lasix  Right-sided shoulder pain with neuropathy -Cervical and right shoulder x-ray unremarkable -Trial gabapentin PRN  -PT for exercises   Hypokalemia -Replace, trend   DVT prophylaxis: Eliquis Code Status: Full code Family Communication: No family at  bedside Disposition Plan:  . Patient is from home prior to admission. . Currently in-hospital treatment needed due to A. fib, AKI.  Cardiology following . Suspect patient will discharge home in 1 to 2 days.   Consultants:   Cardiology  Procedures:   None  Antimicrobials:  Anti-infectives (From admission, onward)   None        Objective: Vitals:   05/07/19 2322 05/08/19 0450 05/08/19 0817 05/08/19 1021  BP: 99/84 107/70 103/83 113/68  Pulse: 75 80 72 70  Resp: 19 (!) 21 18   Temp: 97.7 F (36.5 C) 97.9 F (36.6 C) 98 F (36.7 C)   TempSrc: Oral Oral Oral   SpO2: 92% 93% 95%   Weight:      Height:        Intake/Output Summary (Last 24 hours) at 05/08/2019 1035 Last data filed at 05/08/2019 0174 Gross per 24 hour  Intake 350 ml  Output 500 ml  Net -150 ml   Filed Weights   05/07/19 0433  Weight: 64.4 kg    Examination:  General exam: Appears calm and comfortable  Respiratory system: Clear to auscultation. Respiratory effort normal. No respiratory distress. No conversational dyspnea.  Cardiovascular system: S1 & S2 heard, Irreg rhythm, rate 80s. No murmurs. No pedal edema. Gastrointestinal system: Abdomen is nondistended, soft and nontender. Normal bowel sounds heard. Central nervous system: Alert and oriented. No focal neurological deficits. Speech clear.  Extremities: Symmetric in appearance  Skin: No rashes, lesions or ulcers on exposed skin  Psychiatry: Judgement and insight appear normal. Mood & affect appropriate.   Data Reviewed: I have personally reviewed following labs and imaging studies  CBC: Recent Labs  Lab 05/07/19 0430 05/08/19 0557  WBC 9.2 6.2  HGB 13.3 14.0  HCT 41.1 41.2  MCV 84.2 80.6  PLT 167 195   Basic Metabolic Panel: Recent Labs  Lab 05/07/19 0430 05/08/19 0557  NA 139 138  K 3.9 3.0*  CL 108 103  CO2 18* 24  GLUCOSE 97 104*  BUN 14 19  CREATININE 1.10 1.43*  CALCIUM 9.1 9.2  MG 1.6* 1.9   GFR: Estimated  Creatinine Clearance: 51.9 mL/min (A) (by C-G formula based on SCr of 1.43 mg/dL (H)). Liver Function Tests: Recent Labs  Lab 05/07/19 0430 05/08/19 0557  AST 38 33  ALT 36 33  ALKPHOS 90 96  BILITOT 1.2 1.7*  PROT 7.4 7.2  ALBUMIN 4.1 3.8   Recent Labs  Lab 05/07/19 0430  LIPASE 50   No results for input(s): AMMONIA in the last 168 hours. Coagulation Profile: Recent Labs  Lab 05/07/19 0823  INR 1.1   Cardiac Enzymes: No results for input(s): CKTOTAL, CKMB, CKMBINDEX, TROPONINI in the last 168 hours. BNP (last 3 results) No results for input(s): PROBNP in the last 8760 hours. HbA1C: No results for input(s): HGBA1C in the last 72 hours. CBG: No results for input(s): GLUCAP in the last 168 hours. Lipid Profile: Recent Labs    05/07/19 0823  CHOL 163  HDL 58  LDLCALC 88  TRIG 84  CHOLHDL 2.8   Thyroid Function Tests: Recent Labs    05/07/19 0430 05/07/19 0825  TSH 12.771*  --   FREET4  --  1.01   Anemia Panel: No results for input(s): VITAMINB12, FOLATE, FERRITIN, TIBC, IRON, RETICCTPCT in the last 72 hours. Sepsis Labs: No results for input(s): PROCALCITON, LATICACIDVEN in the last 168 hours.  Recent Results (from the past 240 hour(s))  Respiratory Panel by RT PCR (Flu A&B, Covid) - Nasopharyngeal Swab     Status: None   Collection Time: 05/07/19  6:09 AM   Specimen: Nasopharyngeal Swab  Result Value Ref Range Status   SARS Coronavirus 2 by RT PCR NEGATIVE NEGATIVE Final    Comment: (NOTE) SARS-CoV-2 target nucleic acids are NOT DETECTED. The SARS-CoV-2 RNA is generally detectable in upper respiratoy specimens during the acute phase of infection. The lowest concentration of SARS-CoV-2 viral copies this assay can detect is 131 copies/mL. A negative result does not preclude SARS-Cov-2 infection and should not be used as the sole basis for treatment or other patient management decisions. A negative result may occur with  improper specimen  collection/handling, submission of specimen other than nasopharyngeal swab, presence of viral mutation(s) within the areas targeted by this assay, and inadequate number of viral copies (<131 copies/mL). A negative result must be combined with clinical observations, patient history, and epidemiological information. The expected result is Negative. Fact Sheet for Patients:  https://www.moore.com/ Fact Sheet for Healthcare Providers:  https://www.young.biz/ This test is not yet ap proved or cleared by the [place] FDA and  has been authorized for detection and/or diagnosis of SARS-CoV-2 by FDA under an Emergency Use Authorization (EUA). This EUA will remain  in effect (meaning this test can be used) for the duration of the COVID-19 declaration under Section 564(b)(1) of the Act, 21 U.S.C. section 360bbb-3(b)(1), unless the authorization is terminated or revoked sooner.    Influenza A by PCR NEGATIVE NEGATIVE Final   Influenza B by PCR NEGATIVE NEGATIVE Final    Comment: (NOTE) The Xpert Xpress SARS-CoV-2/FLU/RSV assay is intended as an aid in  the diagnosis of influenza from Nasopharyngeal swab specimens and  should not be used as a sole basis for treatment. Nasal washings and  aspirates are unacceptable for Xpert Xpress SARS-CoV-2/FLU/RSV  testing. Fact Sheet for Patients: https://www.moore.com/ Fact Sheet for Healthcare Providers: https://www.young.biz/ This test is not yet approved or cleared by the [place] FDA and  has been authorized for detection and/or diagnosis of SARS-CoV-2 by  FDA under an Emergency Use Authorization (EUA). This EUA will remain  in effect (meaning this test can be used) for the duration of the  Covid-19 declaration under Section 564(b)(1) of the Act, 21  U.S.C. section 360bbb-3(b)(1), unless the authorization is  terminated or revoked. Performed at [facility]  Lab, [address], [place], [place] [address]       Radiology Studies: DG Cervical Spine Complete  Result Date: 05/07/2019 CLINICAL DATA:  Neck pain for 1 day, no known injury, initial encounter EXAM: CERVICAL SPINE - COMPLETE 4+ VIEW COMPARISON:  None. FINDINGS: Seven cervical segments are well visualized. Mild osteophytic changes are noted most prominent at C4-5. Mild neural foraminal narrowing is seen bilaterally. The odontoid is within normal limits. No acute fracture or acute facet abnormality is seen. No soft tissue changes are noted. IMPRESSION: Mild degenerative change without acute abnormality. Electronically Signed   By: Alcide Clever M.D.   On: 05/07/2019 15:45   DG Shoulder Right  Result Date: 05/07/2019 CLINICAL DATA:  Right shoulder pain for 1 day, no known injury, initial encounter EXAM: RIGHT SHOULDER - 2+ VIEW COMPARISON:  None. FINDINGS: There is no evidence of fracture or dislocation. There is no evidence of arthropathy or other focal bone abnormality. Soft tissues are unremarkable. IMPRESSION: No acute abnormality noted. Electronically Signed   By: Alcide Clever M.D.   On: 05/07/2019 15:45   DG Chest Portable 1 View  Result Date: 05/07/2019 CLINICAL DATA:  Chest pain. Right-sided pain radiating down right arm. Shortness of breath. EXAM: PORTABLE CHEST 1 VIEW COMPARISON:  None. FINDINGS: Mild cardiomegaly with normal mediastinal contours. Mild pulmonary edema with Kerley B-lines. Possible small pleural effusions. No confluent airspace disease. No pneumothorax. No acute osseous abnormalities are seen. IMPRESSION: Mild cardiomegaly and pulmonary edema. Possible small pleural effusions. Electronically Signed   By: Narda Rutherford M.D.   On: 05/07/2019 04:50   ECHOCARDIOGRAM COMPLETE  Result Date: 05/07/2019    ECHOCARDIOGRAM REPORT   Patient Name:   Alwaleed KOICHI PLATTE Date of Exam: 05/07/2019 Medical Rec #:  [id number]         Height:       67.0 in Accession #:    [id number]         Weight:       142.0 lb Date of Birth:  1960-04-22         BSA:          1.75 m Patient Age:    59 years          BP:           154/109 mmHg Patient Gender: M                 HR:           74 bpm. Exam Location:  Inpatient Procedure: 2D Echo, Cardiac Doppler and Color Doppler Indications:    I48.91* Unspeicified atrial fibrillation  History:        Patient has no prior history of Echocardiogram examinations.                 Abnormal ECG, Arrythmias:Atrial Fibrillation;  Risk                 Factors:Hypertension. Elevated troponin.  Sonographer:    Sheralyn Boatman RDCS Referring Phys: [id number] JAMES KIM IMPRESSIONS  1. Left ventricular ejection fraction, by estimation, is 45 to 50%. The left ventricle has mildly decreased function. The left ventricle demonstrates global hypokinesis. Left ventricular diastolic function could not be evaluated.  2. Right ventricular systolic function is low normal. The right ventricular size is normal. There is mildly elevated pulmonary artery systolic pressure.  3. The mitral valve is grossly normal. Mild mitral valve regurgitation.  4. Tricuspid valve regurgitation is mild to moderate.  5. The aortic valve is tricuspid. Aortic valve regurgitation is mild. No aortic stenosis is present.  6. There is mild dilatation of the ascending aorta measuring 39 mm.  7. The inferior vena cava is dilated in size with >50% respiratory variability, suggesting right atrial pressure of 8 mmHg. Comparison(s): No prior Echocardiogram. FINDINGS  Left Ventricle: Left ventricular ejection fraction, by estimation, is 45 to 50%. The left ventricle has mildly decreased function. The left ventricle demonstrates global hypokinesis. The left ventricular internal cavity size was normal in size. There is  no left ventricular hypertrophy. The left ventricular diastology could not be evaluated due to atrial fibrillation. Left ventricular diastolic function could not be evaluated. Right Ventricle: The right ventricular size is  normal. No increase in right ventricular wall thickness. Right ventricular systolic function is low normal. There is mildly elevated pulmonary artery systolic pressure. The tricuspid regurgitant velocity is 2.48 m/s, and with an assumed right atrial pressure of 8 mmHg, the estimated right ventricular systolic pressure is 32.6 mmHg. Left Atrium: Left atrial size was normal in size. Right Atrium: Right atrial size was normal in size. Pericardium: Trivial pericardial effusion is present. Presence of pericardial fat pad. Mitral Valve: The mitral valve is grossly normal. Mild mitral valve regurgitation. Tricuspid Valve: The tricuspid valve is grossly normal. Tricuspid valve regurgitation is mild to moderate. Aortic Valve: The aortic valve is tricuspid. Aortic valve regurgitation is mild. Aortic regurgitation PHT measures 248 msec. No aortic stenosis is present. Pulmonic Valve: The pulmonic valve was grossly normal. Pulmonic valve regurgitation is not visualized. No evidence of pulmonic stenosis. Aorta: The aortic root is normal in size and structure. There is mild dilatation of the ascending aorta measuring 39 mm. Venous: The inferior vena cava is dilated in size with greater than 50% respiratory variability, suggesting right atrial pressure of 8 mmHg. IAS/Shunts: No atrial level shunt detected by color flow Doppler.  LEFT VENTRICLE PLAX 2D LVIDd:         4.90 cm LVIDs:         3.80 cm LV PW:         1.30 cm LV IVS:        1.00 cm LVOT diam:     2.50 cm LV SV:         81.98 ml LV SV Index:   29.12 LVOT Area:     4.91 cm  LV Volumes (MOD) LV vol d, MOD A2C: 116.0 ml LV vol d, MOD A4C: 114.0 ml LV vol s, MOD A2C: 72.5 ml LV vol s, MOD A4C: 58.5 ml LV SV MOD A2C:     43.5 ml LV SV MOD A4C:     114.0 ml LV SV MOD BP:      45.1 ml RIGHT VENTRICLE         IVC TAPSE (M-mode): 1.5 cm  IVC diam: 2.10 cm LEFT ATRIUM             Index       RIGHT ATRIUM           Index LA diam:        4.40 cm 2.52 cm/m  RA Area:     19.00 cm LA  Vol (A2C):   51.0 ml 29.17 ml/m RA Volume:   55.20 ml  31.57 ml/m LA Vol (A4C):   47.3 ml 27.05 ml/m LA Biplane Vol: 51.2 ml 29.29 ml/m  AORTIC VALVE LVOT Vmax:   79.80 cm/s LVOT Vmean:  55.800 cm/s LVOT VTI:    0.167 m AI PHT:      248 msec  AORTA Ao Root diam: 3.40 cm Ao Asc diam:  3.60 cm MITRAL VALVE                 TRICUSPID VALVE MV Area (PHT): 4.67 cm      TR Peak grad:   24.6 mmHg MV Decel Time: 162 msec      TR Vmax:        248.00 cm/s MR Peak grad:    81.7 mmHg MR Mean grad:    58.0 mmHg   SHUNTS MR Vmax:         452.00 cm/s Systemic VTI:  0.17 m MR Vmean:        359.5 cm/s  Systemic Diam: 2.50 cm MR PISA:         0.57 cm MR PISA Eff ROA: 4 mm MR PISA Radius:  0.30 cm MV E velocity: 103.00 cm/s Lennie Odor MD Electronically signed by Lennie Odor MD Signature Date/Time: 05/07/2019/5:34:05 PM    Final       Scheduled Meds: . apixaban  5 mg Oral BID  . atorvastatin  80 mg Oral q1800  . furosemide  20 mg Oral Daily  . ketorolac  30 mg Intravenous Once  . metoprolol succinate  100 mg Oral Daily  . potassium chloride  40 mEq Oral Q4H  . sodium chloride flush  3 mL Intravenous Once   Continuous Infusions:   LOS: 1 day      Time spent: 25 minutes   Noralee Stain, DO [facility] 05/08/2019, 10:35 AM   Available via Epic secure chat 7am-7pm After these hours, please refer to coverage provider listed on [email or web address]

## 2019-05-08 NOTE — Progress Notes (Addendum)
Progress Note  Patient Name: Seth Snyder Date of Encounter: 05/08/2019  Primary Cardiologist: Chilton Si, MD   Subjective   Shoulder feeling better but not completely resolved.  No palpitations.  Still feels SOB.   Inpatient Medications    Scheduled Meds: . apixaban  5 mg Oral BID  . atorvastatin  80 mg Oral q1800  . furosemide  20 mg Oral Daily  . ketorolac  30 mg Intravenous Once  . metoprolol succinate  100 mg Oral Daily  . potassium chloride  40 mEq Oral Q4H  . sodium chloride flush  3 mL Intravenous Once   Continuous Infusions:  PRN Meds: acetaminophen **OR** acetaminophen, gabapentin   Vital Signs    Vitals:   05/07/19 2322 05/08/19 0450 05/08/19 0817 05/08/19 1021  BP: 99/84 107/70 103/83 113/68  Pulse: 75 80 72 70  Resp: 19 (!) 21 18   Temp: 97.7 F (36.5 C) 97.9 F (36.6 C) 98 F (36.7 C)   TempSrc: Oral Oral Oral   SpO2: 92% 93% 95%   Weight:      Height:        Intake/Output Summary (Last 24 hours) at 05/08/2019 1032 Last data filed at 05/08/2019 5093 Gross per 24 hour  Intake 350 ml  Output 500 ml  Net -150 ml   Last 3 Weights 05/07/2019  Weight (lbs) 142 lb  Weight (kg) 64.411 kg      Telemetry    Atrial fibrillation.  Rates were <100, now starting to rise again.  - Personally Reviewed  ECG    n/a - Personally Reviewed  Physical Exam   VS:  BP 113/68 (BP Location: Right Arm)   Pulse 70   Temp 98 F (36.7 C) (Oral)   Resp 18   Ht 5\' 7"  (1.702 m)   Wt 64.4 kg   SpO2 95%   BMI 22.24 kg/m  , BMI Body mass index is 22.24 kg/m. GENERAL:  Well appearing HEENT: Pupils equal round and reactive, fundi not visualized, oral mucosa unremarkable NECK:  No jugular venous distention, waveform within normal limits, carotid upstroke brisk and symmetric, no bruits, no thyromegaly LYMPHATICS:  No cervical adenopathy LUNGS:  Clear to auscultation bilaterally HEART:  Irregularly irregular.  PMI not displaced or sustained,S1 and S2  within normal limits, no S3, no S4, no clicks, no rubs, no murmurs ABD:  Flat, positive bowel sounds normal in frequency in pitch, no bruits, no rebound, no guarding, no midline pulsatile mass, no hepatomegaly, no splenomegaly EXT:  2 plus pulses throughout, no edema, no cyanosis no clubbing SKIN:  No rashes no nodules NEURO:  Cranial nerves II through XII grossly intact, motor grossly intact throughout PSYCH:  Cognitively intact, oriented to person place and time   Labs    High Sensitivity Troponin:   Recent Labs  Lab 05/07/19 0430 05/07/19 0822  TROPONINIHS 21* 20*      Chemistry Recent Labs  Lab 05/07/19 0430 05/08/19 0557  NA 139 138  K 3.9 3.0*  CL 108 103  CO2 18* 24  GLUCOSE 97 104*  BUN 14 19  CREATININE 1.10 1.43*  CALCIUM 9.1 9.2  PROT 7.4 7.2  ALBUMIN 4.1 3.8  AST 38 33  ALT 36 33  ALKPHOS 90 96  BILITOT 1.2 1.7*  GFRNONAA >60 54*  GFRAA >60 >60  ANIONGAP 13 11     Hematology Recent Labs  Lab 05/07/19 0430 05/08/19 0557  WBC 9.2 6.2  RBC 4.88 5.11  HGB  13.3 14.0  HCT 41.1 41.2  MCV 84.2 80.6  MCH 27.3 27.4  MCHC 32.4 34.0  RDW 15.3 14.9  PLT 167 195    BNP Recent Labs  Lab 05/07/19 0430  BNP 733.8*     DDimer No results for input(s): DDIMER in the last 168 hours.   Radiology    DG Cervical Spine Complete  Result Date: 05/07/2019 CLINICAL DATA:  Neck pain for 1 day, no known injury, initial encounter EXAM: CERVICAL SPINE - COMPLETE 4+ VIEW COMPARISON:  None. FINDINGS: Seven cervical segments are well visualized. Mild osteophytic changes are noted most prominent at C4-5. Mild neural foraminal narrowing is seen bilaterally. The odontoid is within normal limits. No acute fracture or acute facet abnormality is seen. No soft tissue changes are noted. IMPRESSION: Mild degenerative change without acute abnormality. Electronically Signed   By: Inez Catalina M.D.   On: 05/07/2019 15:45   DG Shoulder Right  Result Date: 05/07/2019 CLINICAL  DATA:  Right shoulder pain for 1 day, no known injury, initial encounter EXAM: RIGHT SHOULDER - 2+ VIEW COMPARISON:  None. FINDINGS: There is no evidence of fracture or dislocation. There is no evidence of arthropathy or other focal bone abnormality. Soft tissues are unremarkable. IMPRESSION: No acute abnormality noted. Electronically Signed   By: Inez Catalina M.D.   On: 05/07/2019 15:45   DG Chest Portable 1 View  Result Date: 05/07/2019 CLINICAL DATA:  Chest pain. Right-sided pain radiating down right arm. Shortness of breath. EXAM: PORTABLE CHEST 1 VIEW COMPARISON:  None. FINDINGS: Mild cardiomegaly with normal mediastinal contours. Mild pulmonary edema with Kerley B-lines. Possible small pleural effusions. No confluent airspace disease. No pneumothorax. No acute osseous abnormalities are seen. IMPRESSION: Mild cardiomegaly and pulmonary edema. Possible small pleural effusions. Electronically Signed   By: Keith Rake M.D.   On: 05/07/2019 04:50   ECHOCARDIOGRAM COMPLETE  Result Date: 05/07/2019    ECHOCARDIOGRAM REPORT   Patient Name:   Seth Snyder Date of Exam: 05/07/2019 Medical Rec #:  [id number]         Height:       67.0 in Accession #:    [id number]        Weight:       142.0 lb Date of Birth:  October 21, 1960         BSA:          1.75 m Patient Age:    59 years          BP:           154/109 mmHg Patient Gender: M                 HR:           74 bpm. Exam Location:  Inpatient Procedure: 2D Echo, Cardiac Doppler and Color Doppler Indications:    I48.91* Unspeicified atrial fibrillation  History:        Patient has no prior history of Echocardiogram examinations.                 Abnormal ECG, Arrythmias:Atrial Fibrillation; Risk                 Factors:Hypertension. Elevated troponin.  Sonographer:    Roseanna Rainbow RDCS Referring Phys: [place]  1. Left ventricular ejection fraction, by estimation, is 45 to 50%. The left ventricle has mildly decreased function. The left ventricle  demonstrates global hypokinesis. Left ventricular diastolic function could not be evaluated.  2. Right ventricular systolic function is low normal. The right ventricular size is normal. There is mildly elevated pulmonary artery systolic pressure.  3. The mitral valve is grossly normal. Mild mitral valve regurgitation.  4. Tricuspid valve regurgitation is mild to moderate.  5. The aortic valve is tricuspid. Aortic valve regurgitation is mild. No aortic stenosis is present.  6. There is mild dilatation of the ascending aorta measuring 39 mm.  7. The inferior vena cava is dilated in size with >50% respiratory variability, suggesting right atrial pressure of 8 mmHg. Comparison(s): No prior Echocardiogram. FINDINGS  Left Ventricle: Left ventricular ejection fraction, by estimation, is 45 to 50%. The left ventricle has mildly decreased function. The left ventricle demonstrates global hypokinesis. The left ventricular internal cavity size was normal in size. There is  no left ventricular hypertrophy. The left ventricular diastology could not be evaluated due to atrial fibrillation. Left ventricular diastolic function could not be evaluated. Right Ventricle: The right ventricular size is normal. No increase in right ventricular wall thickness. Right ventricular systolic function is low normal. There is mildly elevated pulmonary artery systolic pressure. The tricuspid regurgitant velocity is 2.48 m/s, and with an assumed right atrial pressure of 8 mmHg, the estimated right ventricular systolic pressure is 32.6 mmHg. Left Atrium: Left atrial size was normal in size. Right Atrium: Right atrial size was normal in size. Pericardium: Trivial pericardial effusion is present. Presence of pericardial fat pad. Mitral Valve: The mitral valve is grossly normal. Mild mitral valve regurgitation. Tricuspid Valve: The tricuspid valve is grossly normal. Tricuspid valve regurgitation is mild to moderate. Aortic Valve: The aortic valve is  tricuspid. Aortic valve regurgitation is mild. Aortic regurgitation PHT measures 248 msec. No aortic stenosis is present. Pulmonic Valve: The pulmonic valve was grossly normal. Pulmonic valve regurgitation is not visualized. No evidence of pulmonic stenosis. Aorta: The aortic root is normal in size and structure. There is mild dilatation of the ascending aorta measuring 39 mm. Venous: The inferior vena cava is dilated in size with greater than 50% respiratory variability, suggesting right atrial pressure of 8 mmHg. IAS/Shunts: No atrial level shunt detected by color flow Doppler.  LEFT VENTRICLE PLAX 2D LVIDd:         4.90 cm LVIDs:         3.80 cm LV PW:         1.30 cm LV IVS:        1.00 cm LVOT diam:     2.50 cm LV SV:         81.98 ml LV SV Index:   29.12 LVOT Area:     4.91 cm  LV Volumes (MOD) LV vol d, MOD A2C: 116.0 ml LV vol d, MOD A4C: 114.0 ml LV vol s, MOD A2C: 72.5 ml LV vol s, MOD A4C: 58.5 ml LV SV MOD A2C:     43.5 ml LV SV MOD A4C:     114.0 ml LV SV MOD BP:      45.1 ml RIGHT VENTRICLE         IVC TAPSE (M-mode): 1.5 cm  IVC diam: 2.10 cm LEFT ATRIUM             Index       RIGHT ATRIUM           Index LA diam:        4.40 cm 2.52 cm/m  RA Area:     19.00 cm LA Vol (A2C):   51.0 ml  29.17 ml/m RA Volume:   55.20 ml  31.57 ml/m LA Vol (A4C):   47.3 ml 27.05 ml/m LA Biplane Vol: 51.2 ml 29.29 ml/m  AORTIC VALVE LVOT Vmax:   79.80 cm/s LVOT Vmean:  55.800 cm/s LVOT VTI:    0.167 m AI PHT:      248 msec  AORTA Ao Root diam: 3.40 cm Ao Asc diam:  3.60 cm MITRAL VALVE                 TRICUSPID VALVE MV Area (PHT): 4.67 cm      TR Peak grad:   24.6 mmHg MV Decel Time: 162 msec      TR Vmax:        248.00 cm/s MR Peak grad:    81.7 mmHg MR Mean grad:    58.0 mmHg   SHUNTS MR Vmax:         452.00 cm/s Systemic VTI:  0.17 m MR Vmean:        359.5 cm/s  Systemic Diam: 2.50 cm MR PISA:         0.57 cm MR PISA Eff ROA: 4 mm MR PISA Radius:  0.30 cm MV E velocity: 103.00 cm/s Lennie Odor MD  Electronically signed by Lennie Odor MD Signature Date/Time: 05/07/2019/5:34:05 PM    Final     Cardiac Studies   Echo 05/07/19 IMPRESSIONS   1. Left ventricular ejection fraction, by estimation, is 45 to 50%. The  left ventricle has mildly decreased function. The left ventricle  demonstrates global hypokinesis. Left ventricular diastolic function could  not be evaluated.  2. Right ventricular systolic function is low normal. The right  ventricular size is normal. There is mildly elevated pulmonary artery  systolic pressure.  3. The mitral valve is grossly normal. Mild mitral valve regurgitation.  4. Tricuspid valve regurgitation is mild to moderate.  5. The aortic valve is tricuspid. Aortic valve regurgitation is mild. No  aortic stenosis is present.  6. There is mild dilatation of the ascending aorta measuring 39 mm.  7. The inferior vena cava is dilated in size with >50% respiratory  variability, suggesting right atrial pressure of 8 mmHg.   Patient Profile     59 y.o. male with paroxysmal atrial fibrillation, chronic systolic and diastolic heart failure, tobacco abuse and hypertension here with shortness of breath and R neck/shoulder pain.    Assessment & Plan    # Atrial fibrillation with RVR: Rates were better controlled.  Now starting to rise.  AM metoprolol was held.  Recommend that this only be held for HR <60 or SBP <90.  Continue Eliquis.  He reports taking this as prescribed, though he was not taking all his other medications.  If he continues to have shortness of breath once his heart rate is adequately controlled he may need TEE/cardioversion now.  Otherwise, would plan for 3 weeks of consecutive anticoagulation followed by outpatient cardioversion.  Thyroid function was normal.  # Hypertension: Metoprolol as above.  # Chronic systolic and diastolic heart failure: LVEF 45 to 50% on echo.  Blood pressure is borderline on metoprolol.  We will stop his home  hydrochlorothiazide and start Lasix 20 mg p.o. daily.  He appears to be euvolemic but still has some mild shortness of breath.  Follow potassium and renal function closely.  # Demand ischemia: High-sensitivity troponin was mildly elevated and flat at 21.  No plan for ischemia evaluation at this time.  Stop aspirin as he is on  Eliquis.  We will stop atorvastatin as his LDL was 88 and he does not have any known ASCVD.  #Neck/shoulder pain: Mild arthritic changes noted in the neck.  Symptoms seem to have improved significantly with Toradol.  Will give 1 more dose.  For questions or updates, please contact [facility] Please consult [email or web address] for contact info under        Signed, Chilton Si, MD  05/08/2019, 10:32 AM

## 2019-05-09 LAB — BASIC METABOLIC PANEL
Anion gap: 15 (ref 5–15)
BUN: 25 mg/dL — ABNORMAL HIGH (ref 6–20)
CO2: 21 mmol/L — ABNORMAL LOW (ref 22–32)
Calcium: 9.2 mg/dL (ref 8.9–10.3)
Chloride: 99 mmol/L (ref 98–111)
Creatinine, Ser: 1.4 mg/dL — ABNORMAL HIGH (ref 0.61–1.24)
GFR calc Af Amer: >60 mL/min (ref >60)
GFR calc non Af Amer: 55 mL/min — ABNORMAL LOW (ref >60)
Glucose, Bld: 98 mg/dL (ref 70–99)
Potassium: 4 mmol/L (ref 3.5–5.1)
Sodium: 135 mmol/L (ref 135–145)

## 2019-05-09 LAB — MAGNESIUM: Magnesium: 1.8 mg/dL (ref 1.7–2.4)

## 2019-05-09 MED ORDER — METOPROLOL SUCCINATE ER 100 MG PO TB24
100.0000 mg | ORAL_TABLET | Freq: Once | ORAL | Status: AC
  Administered 2019-05-09: 10:00:00 100 mg via ORAL
  Filled 2019-05-09 (×2): qty 1

## 2019-05-09 MED ORDER — METOPROLOL SUCCINATE ER 100 MG PO TB24
200.0000 mg | ORAL_TABLET | Freq: Every day | ORAL | Status: DC
  Administered 2019-05-10: 200 mg via ORAL
  Filled 2019-05-09: qty 2

## 2019-05-09 MED ORDER — SODIUM CHLORIDE 0.9 % IV SOLN: INTRAVENOUS | Status: DC

## 2019-05-09 NOTE — Progress Notes (Signed)
Progress Note  Patient Name: Seth Snyder Date of Encounter: 05/09/2019  Primary Cardiologist: Chilton Si, MD   Subjective   Continues to have shoulder pain and intermittent shortness of breath.    Inpatient Medications    Scheduled Meds: . apixaban  5 mg Oral BID  . metoprolol succinate  100 mg Oral Once  . [START ON 05/10/2019] metoprolol succinate  200 mg Oral Daily  . sodium chloride flush  3 mL Intravenous Once   Continuous Infusions:  PRN Meds: acetaminophen **OR** acetaminophen, gabapentin   Vital Signs    Vitals:   05/08/19 1934 05/09/19 0654 05/09/19 0913 05/09/19 0918  BP: 108/84 111/81 (!) 148/115 (!) 133/107  Pulse: 88 89 94 81  Resp:    16  Temp: 98 F (36.7 C) 97.8 F (36.6 C)  97.9 F (36.6 C)  TempSrc: Oral Oral  Oral  SpO2: 96% 97% 98% 96%  Weight:  66.8 kg    Height:        Intake/Output Summary (Last 24 hours) at 05/09/2019 1000 Last data filed at 05/08/2019 1940 Gross per 24 hour  Intake 1059.56 ml  Output --  Net 1059.56 ml   Last 3 Weights 05/09/2019 05/07/2019  Weight (lbs) 147 lb 3.2 oz 142 lb  Weight (kg) 66.769 kg 64.411 kg      Telemetry    Atrial fibrillation.  Rates mostly <100 bpm.  Intermittently 100-120s.  - Personally Reviewed  ECG    n/a - Personally Reviewed  Physical Exam   VS:  BP (!) 133/107 (BP Location: Right Arm)   Pulse 81   Temp 97.9 F (36.6 C) (Oral)   Resp 16   Ht 5\' 7"  (1.702 m)   Wt 66.8 kg   SpO2 96%   BMI 23.05 kg/m  , BMI Body mass index is 23.05 kg/m. GENERAL:  Well appearing HEENT: Pupils equal round and reactive, fundi not visualized, oral mucosa unremarkable NECK:  No jugular venous distention, waveform within normal limits, carotid upstroke brisk and symmetric, no bruits, no thyromegaly LYMPHATICS:  No cervical adenopathy LUNGS:  Clear to auscultation bilaterally HEART:  Irregularly irregular.  PMI not displaced or sustained,S1 and S2 within normal limits, no S3, no S4, no  clicks, no rubs, no murmurs ABD:  Flat, positive bowel sounds normal in frequency in pitch, no bruits, no rebound, no guarding, no midline pulsatile mass, no hepatomegaly, no splenomegaly EXT:  2 plus pulses throughout, no edema, no cyanosis no clubbing SKIN:  No rashes no nodules NEURO:  Cranial nerves II through XII grossly intact, motor grossly intact throughout PSYCH:  Cognitively intact, oriented to person place and time   Labs    High Sensitivity Troponin:   Recent Labs  Lab 05/07/19 0430 05/07/19 0822  TROPONINIHS 21* 20*      Chemistry Recent Labs  Lab 05/07/19 0430 05/08/19 0557 05/09/19 0329  NA 139 138 135  K 3.9 3.0* 4.0  CL 108 103 99  CO2 18* 24 21*  GLUCOSE 97 104* 98  BUN 14 19 25*  CREATININE 1.10 1.43* 1.40*  CALCIUM 9.1 9.2 9.2  PROT 7.4 7.2  --   ALBUMIN 4.1 3.8  --   AST 38 33  --   ALT 36 33  --   ALKPHOS 90 96  --   BILITOT 1.2 1.7*  --   GFRNONAA >60 54* 55*  GFRAA >60 >60 >60  ANIONGAP 13 11 15      Hematology Recent Labs  Lab 05/07/19 0430 05/08/19 0557  WBC 9.2 6.2  RBC 4.88 5.11  HGB 13.3 14.0  HCT 41.1 41.2  MCV 84.2 80.6  MCH 27.3 27.4  MCHC 32.4 34.0  RDW 15.3 14.9  PLT 167 195    BNP Recent Labs  Lab 05/07/19 0430  BNP 733.8*     DDimer No results for input(s): DDIMER in the last 168 hours.   Radiology    DG Cervical Spine Complete  Result Date: 05/07/2019 CLINICAL DATA:  Neck pain for 1 day, no known injury, initial encounter EXAM: CERVICAL SPINE - COMPLETE 4+ VIEW COMPARISON:  None. FINDINGS: Seven cervical segments are well visualized. Mild osteophytic changes are noted most prominent at C4-5. Mild neural foraminal narrowing is seen bilaterally. The odontoid is within normal limits. No acute fracture or acute facet abnormality is seen. No soft tissue changes are noted. IMPRESSION: Mild degenerative change without acute abnormality. Electronically Signed   By: Alcide Clever M.D.   On: 05/07/2019 15:45   DG  Shoulder Right  Result Date: 05/07/2019 CLINICAL DATA:  Right shoulder pain for 1 day, no known injury, initial encounter EXAM: RIGHT SHOULDER - 2+ VIEW COMPARISON:  None. FINDINGS: There is no evidence of fracture or dislocation. There is no evidence of arthropathy or other focal bone abnormality. Soft tissues are unremarkable. IMPRESSION: No acute abnormality noted. Electronically Signed   By: Alcide Clever M.D.   On: 05/07/2019 15:45   ECHOCARDIOGRAM COMPLETE  Result Date: 05/07/2019    ECHOCARDIOGRAM REPORT   Patient Name:   Seth Snyder Date of Exam: 05/07/2019 Medical Rec #:  [id number]         Height:       67.0 in Accession #:    [id number]        Weight:       142.0 lb Date of Birth:  [birth year]         BSA:          1.75 m Patient Age:    59 years          BP:           154/109 mmHg Patient Gender: M                 HR:           74 bpm. Exam Location:  Inpatient Procedure: 2D Echo, Cardiac Doppler and Color Doppler Indications:    I48.91* Unspeicified atrial fibrillation  History:        Patient has no prior history of Echocardiogram examinations.                 Abnormal ECG, Arrythmias:Atrial Fibrillation; Risk                 Factors:Hypertension. Elevated troponin.  Sonographer:    Sheralyn Boatman RDCS Referring Phys: [id number] JAMES KIM IMPRESSIONS  1. Left ventricular ejection fraction, by estimation, is 45 to 50%. The left ventricle has mildly decreased function. The left ventricle demonstrates global hypokinesis. Left ventricular diastolic function could not be evaluated.  2. Right ventricular systolic function is low normal. The right ventricular size is normal. There is mildly elevated pulmonary artery systolic pressure.  3. The mitral valve is grossly normal. Mild mitral valve regurgitation.  4. Tricuspid valve regurgitation is mild to moderate.  5. The aortic valve is tricuspid. Aortic valve regurgitation is mild. No aortic stenosis is present.  6. There is mild dilatation of the ascending aorta  measuring  39 mm.  7. The inferior vena cava is dilated in size with >50% respiratory variability, suggesting right atrial pressure of 8 mmHg. Comparison(s): No prior Echocardiogram. FINDINGS  Left Ventricle: Left ventricular ejection fraction, by estimation, is 45 to 50%. The left ventricle has mildly decreased function. The left ventricle demonstrates global hypokinesis. The left ventricular internal cavity size was normal in size. There is  no left ventricular hypertrophy. The left ventricular diastology could not be evaluated due to atrial fibrillation. Left ventricular diastolic function could not be evaluated. Right Ventricle: The right ventricular size is normal. No increase in right ventricular wall thickness. Right ventricular systolic function is low normal. There is mildly elevated pulmonary artery systolic pressure. The tricuspid regurgitant velocity is 2.48 m/s, and with an assumed right atrial pressure of 8 mmHg, the estimated right ventricular systolic pressure is 32.6 mmHg. Left Atrium: Left atrial size was normal in size. Right Atrium: Right atrial size was normal in size. Pericardium: Trivial pericardial effusion is present. Presence of pericardial fat pad. Mitral Valve: The mitral valve is grossly normal. Mild mitral valve regurgitation. Tricuspid Valve: The tricuspid valve is grossly normal. Tricuspid valve regurgitation is mild to moderate. Aortic Valve: The aortic valve is tricuspid. Aortic valve regurgitation is mild. Aortic regurgitation PHT measures 248 msec. No aortic stenosis is present. Pulmonic Valve: The pulmonic valve was grossly normal. Pulmonic valve regurgitation is not visualized. No evidence of pulmonic stenosis. Aorta: The aortic root is normal in size and structure. There is mild dilatation of the ascending aorta measuring 39 mm. Venous: The inferior vena cava is dilated in size with greater than 50% respiratory variability, suggesting right atrial pressure of 8 mmHg. IAS/Shunts:  No atrial level shunt detected by color flow Doppler.  LEFT VENTRICLE PLAX 2D LVIDd:         4.90 cm LVIDs:         3.80 cm LV PW:         1.30 cm LV IVS:        1.00 cm LVOT diam:     2.50 cm LV SV:         81.98 ml LV SV Index:   29.12 LVOT Area:     4.91 cm  LV Volumes (MOD) LV vol d, MOD A2C: 116.0 ml LV vol d, MOD A4C: 114.0 ml LV vol s, MOD A2C: 72.5 ml LV vol s, MOD A4C: 58.5 ml LV SV MOD A2C:     43.5 ml LV SV MOD A4C:     114.0 ml LV SV MOD BP:      45.1 ml RIGHT VENTRICLE         IVC TAPSE (M-mode): 1.5 cm  IVC diam: 2.10 cm LEFT ATRIUM             Index       RIGHT ATRIUM           Index LA diam:        4.40 cm 2.52 cm/m  RA Area:     19.00 cm LA Vol (A2C):   51.0 ml 29.17 ml/m RA Volume:   55.20 ml  31.57 ml/m LA Vol (A4C):   47.3 ml 27.05 ml/m LA Biplane Vol: 51.2 ml 29.29 ml/m  AORTIC VALVE LVOT Vmax:   79.80 cm/s LVOT Vmean:  55.800 cm/s LVOT VTI:    0.167 m AI PHT:      248 msec  AORTA Ao Root diam: 3.40 cm Ao Asc diam:  3.60 cm MITRAL  VALVE                 TRICUSPID VALVE MV Area (PHT): 4.67 cm      TR Peak grad:   24.6 mmHg MV Decel Time: 162 msec      TR Vmax:        248.00 cm/s MR Peak grad:    81.7 mmHg MR Mean grad:    58.0 mmHg   SHUNTS MR Vmax:         452.00 cm/s Systemic VTI:  0.17 m MR Vmean:        359.5 cm/s  Systemic Diam: 2.50 cm MR PISA:         0.57 cm MR PISA Eff ROA: 4 mm MR PISA Radius:  0.30 cm MV E velocity: 103.00 cm/s Eleonore Chiquito MD Electronically signed by Eleonore Chiquito MD Signature Date/Time: 05/07/2019/5:34:05 PM    Final     Cardiac Studies   Echo 05/07/19 IMPRESSIONS   1. Left ventricular ejection fraction, by estimation, is 45 to 50%. The  left ventricle has mildly decreased function. The left ventricle  demonstrates global hypokinesis. Left ventricular diastolic function could  not be evaluated.  2. Right ventricular systolic function is low normal. The right  ventricular size is normal. There is mildly elevated pulmonary artery  systolic  pressure.  3. The mitral valve is grossly normal. Mild mitral valve regurgitation.  4. Tricuspid valve regurgitation is mild to moderate.  5. The aortic valve is tricuspid. Aortic valve regurgitation is mild. No  aortic stenosis is present.  6. There is mild dilatation of the ascending aorta measuring 39 mm.  7. The inferior vena cava is dilated in size with >50% respiratory  variability, suggesting right atrial pressure of 8 mmHg.   Patient Profile     59 y.o. male with paroxysmal atrial fibrillation, chronic systolic and diastolic heart failure, tobacco abuse and hypertension here with shortness of breath and R neck/shoulder pain.    Assessment & Plan    # Atrial fibrillation with RVR: Rates are mostly controlled.  However it continues to go into the 100s at rest at times.  He continues to have shortness of breath which is likely 2/2 afib.  We will increase metoprolol to 200 mg daily.  Continue Eliquis.  He reports taking this as prescribed, though he was not taking all his other medications.  We will plan for TEE/cardioversion.  Unfortunately there is no room on the schedule today, especially given that he is already eating.  We will try to make this happen tomorrow.  Given that this is his second episode in 2 months, he will likely need an antiarrhythmic.  He does not currently have insurance, which will make this challenging.  Ideally he would not be on amiodarone given his age.  He has subclinical hypothyroidism with elevated TSH and normal free T4.  We can consider flecainide, though he will need a stress test.  # Hypertension: Metoprolol as above.  # Chronic systolic and diastolic heart failure: LVEF 45 to 50% on echo.  Blood pressure is borderline on metoprolol.  Home hydrochlorothiazide has been discontinued.  Renal function is slightly worse.  Once it normalizes would use Lasix for volume control as needed.  Rate control and cardioversion as above.  # Demand  ischemia: High-sensitivity troponin was mildly elevated and flat at 21.  Stop aspirin as he is on Eliquis.  We will stop atorvastatin as his LDL was 88 and he does not have  any known ASCVD.  # Neck/shoulder pain: Mild arthritic changes noted in the neck.  Symptoms seem to have improved significantly with Toradol.  Will give 1 more dose.  For questions or updates, please contact [facility] Please consult [email or web address] for contact info under        Signed, Joedy Eickhoff Winslow, MD  05/09/2019, 10:00 AM    

## 2019-05-09 NOTE — Progress Notes (Signed)
PROGRESS NOTE    Seth Snyder  ONG:[id number] DOB: June 12, 1960 DOA: 05/07/2019 PCP: Patient, No Pcp Per    Brief Narrative:  Seth Snyder is a 59 yo male with past medical history significant for paroxysmal atrial fibrillation, hypothyroidism, hypertension who was diagnosed with A. fib at [facility] in Nov 2020.  During that admission, patient underwent cardioversion and was started on baby aspirin, Eliquis, metoprolol. He ended up moving to [place] from [place] in late December and has not established with physicians in town yet. He recently ran out of his medications including metoprolol, hydrochlorothiazide and aspirin.  He reports that he has been taking Eliquis.   -Presented to the ED with chest pain, noted to have rapid atrial flutter  -cardiology consulted for A. fib RVR.  New events last 24 hours / Subjective: -Heart rate fluctuating from 90s to 120s range, mild dyspnea -Continues to report right shoulder discomfort  Assessment & Plan:   A. fib RVR -Ran out of his home medications including Toprol, aspirin as outpatient.   -TSH up but free T4 is normal  -Echocardiogram revealed EF 45 to 50%, global hypokinesis left ventricle -Cardiology following -Continue Eliquis, metoprolol.  Stopped aspirin  -Heart rate remains suboptimally controlled plan for DC cardioversion later today or in a.m.  Chronic systolic and diastolic heart failure -BNP 733.8 -Clinically appears close to euvolemic, holding Lasix today  AKI -Creatinine bumped to 1.4 yesterday, now in the same range, HCTZ discontinued, he also got some Toradol for his right shoulder which could have contributed -Now Lasix on hold, monitor  Right-sided shoulder pain with suspected radiculopathy -Cervical and right shoulder x-ray unremarkable -Trial gabapentin PRN  -PT for exercises   Hypokalemia -Replace, trend   DVT prophylaxis: Eliquis Code Status: Full code Family Communication: No  family at bedside Disposition Plan: Home pending improvement in rapid A. fib  Consultants:   Cardiology  Procedures:   None  Antimicrobials:  Anti-infectives (From admission, onward)   None       Objective: Vitals:   05/09/19 0654 05/09/19 0913 05/09/19 0918 05/09/19 1154  BP: 111/81 (!) 148/115 (!) 133/107 (!) 120/93  Pulse: 89 94 81 83  Resp:   16 15  Temp: 97.8 F (36.6 C)  97.9 F (36.6 C) 98.6 F (37 C)  TempSrc: Oral  Oral Oral  SpO2: 97% 98% 96% 95%  Weight: 66.8 kg     Height:        Intake/Output Summary (Last 24 hours) at 05/09/2019 1209 Last data filed at 05/08/2019 1940 Gross per 24 hour  Intake 1049.56 ml  Output --  Net 1049.56 ml   Filed Weights   05/07/19 0433 05/09/19 0654  Weight: 64.4 kg 66.8 kg    Examination:  Gen: Awake, Alert, Oriented X 3, no distress HEENT: PERRLA, Neck supple, no JVD Lungs: Few rales at the right base, otherwise clear CVS: S1-S2 irregularly irregular rhythm Abd: soft, Non tender, non distended, BS present Extremities: No edema Skin: No rashes, lesions or ulcers on exposed skin  Psychiatry: Judgement and insight appear normal. Mood & affect appropriate.   Data Reviewed: I have personally reviewed following labs and imaging studies  CBC: Recent Labs  Lab 05/07/19 0430 05/08/19 0557  WBC 9.2 6.2  HGB 13.3 14.0  HCT 41.1 41.2  MCV 84.2 80.6  PLT 167 195   Basic Metabolic Panel: Recent Labs  Lab 05/07/19 0430 05/08/19 0557 05/09/19 0329  NA 139 138 135  K 3.9 3.0* 4.0  CL 108 103 99  CO2 18* 24 21*  GLUCOSE 97 104* 98  BUN 14 19 25*  CREATININE 1.10 1.43* 1.40*  CALCIUM 9.1 9.2 9.2  MG 1.6* 1.9 1.8   GFR: Estimated Creatinine Clearance: 54.4 mL/min (A) (by C-G formula based on SCr of 1.4 mg/dL (H)). Liver Function Tests: Recent Labs  Lab 05/07/19 0430 05/08/19 0557  AST 38 33  ALT 36 33  ALKPHOS 90 96  BILITOT 1.2 1.7*  PROT 7.4 7.2  ALBUMIN 4.1 3.8   Recent Labs  Lab 05/07/19 0430   LIPASE 50   No results for input(s): AMMONIA in the last 168 hours. Coagulation Profile: Recent Labs  Lab 05/07/19 0823  INR 1.1   Cardiac Enzymes: No results for input(s): CKTOTAL, CKMB, CKMBINDEX, TROPONINI in the last 168 hours. BNP (last 3 results) No results for input(s): PROBNP in the last 8760 hours. HbA1C: No results for input(s): HGBA1C in the last 72 hours. CBG: No results for input(s): GLUCAP in the last 168 hours. Lipid Profile: Recent Labs    05/07/19 0823  CHOL 163  HDL 58  LDLCALC 88  TRIG 84  CHOLHDL 2.8   Thyroid Function Tests: Recent Labs    05/07/19 0430 05/07/19 0825  TSH 12.771*  --   FREET4  --  1.01   Anemia Panel: No results for input(s): VITAMINB12, FOLATE, FERRITIN, TIBC, IRON, RETICCTPCT in the last 72 hours. Sepsis Labs: No results for input(s): PROCALCITON, LATICACIDVEN in the last 168 hours.  Recent Results (from the past 240 hour(s))  Respiratory Panel by RT PCR (Flu A&B, Covid) - Nasopharyngeal Swab     Status: None   Collection Time: 05/07/19  6:09 AM   Specimen: Nasopharyngeal Swab  Result Value Ref Range Status   SARS Coronavirus 2 by RT PCR NEGATIVE NEGATIVE Final    Comment: (NOTE) SARS-CoV-2 target nucleic acids are NOT DETECTED. The SARS-CoV-2 RNA is generally detectable in upper respiratoy specimens during the acute phase of infection. The lowest concentration of SARS-CoV-2 viral copies this assay can detect is 131 copies/mL. A negative result does not preclude SARS-Cov-2 infection and should not be used as the sole basis for treatment or other patient management decisions. A negative result may occur with  improper specimen collection/handling, submission of specimen other than nasopharyngeal swab, presence of viral mutation(s) within the areas targeted by this assay, and inadequate number of viral copies (<131 copies/mL). A negative result must be combined with clinical observations, patient history, and  epidemiological information. The expected result is Negative. Fact Sheet for Patients:  https://www.moore.com/ Fact Sheet for Healthcare Providers:  https://www.young.biz/ This test is not yet ap proved or cleared by the [place] FDA and  has been authorized for detection and/or diagnosis of SARS-CoV-2 by FDA under an Emergency Use Authorization (EUA). This EUA will remain  in effect (meaning this test can be used) for the duration of the COVID-19 declaration under Section 564(b)(1) of the Act, 21 U.S.C. section 360bbb-3(b)(1), unless the authorization is terminated or revoked sooner.    Influenza A by PCR NEGATIVE NEGATIVE Final   Influenza B by PCR NEGATIVE NEGATIVE Final    Comment: (NOTE) The Xpert Xpress SARS-CoV-2/FLU/RSV assay is intended as an aid in  the diagnosis of influenza from Nasopharyngeal swab specimens and  should not be used as a sole basis for treatment. Nasal washings and  aspirates are unacceptable for Xpert Xpress SARS-CoV-2/FLU/RSV  testing. Fact Sheet for Patients: https://www.moore.com/ Fact Sheet for Healthcare Providers: https://www.young.biz/  This test is not yet approved or cleared by the [place] and  has been authorized for detection and/or diagnosis of SARS-CoV-2 by  FDA under an Emergency Use Authorization (EUA). This EUA will remain  in effect (meaning this test can be used) for the duration of the  Covid-19 declaration under Section 564(b)(1) of the Act, 21  U.S.C. section 360bbb-3(b)(1), unless the authorization is  terminated or revoked. Performed at [facility], [address], [place], [place] [address]       Radiology Studies: DG Cervical Spine Complete  Result Date: 05/07/2019 CLINICAL DATA:  Neck pain for 1 day, no known injury, initial encounter EXAM: CERVICAL SPINE - COMPLETE 4+ VIEW COMPARISON:  None. FINDINGS: Seven cervical segments  are well visualized. Mild osteophytic changes are noted most prominent at C4-5. Mild neural foraminal narrowing is seen bilaterally. The odontoid is within normal limits. No acute fracture or acute facet abnormality is seen. No soft tissue changes are noted. IMPRESSION: Mild degenerative change without acute abnormality. Electronically Signed   By: Alcide Clever M.D.   On: 05/07/2019 15:45   DG Shoulder Right  Result Date: 05/07/2019 CLINICAL DATA:  Right shoulder pain for 1 day, no known injury, initial encounter EXAM: RIGHT SHOULDER - 2+ VIEW COMPARISON:  None. FINDINGS: There is no evidence of fracture or dislocation. There is no evidence of arthropathy or other focal bone abnormality. Soft tissues are unremarkable. IMPRESSION: No acute abnormality noted. Electronically Signed   By: Alcide Clever M.D.   On: 05/07/2019 15:45   ECHOCARDIOGRAM COMPLETE  Result Date: 05/07/2019    ECHOCARDIOGRAM REPORT   Patient Name:   Clayson YOSIAH HAIRR Date of Exam: 05/07/2019 Medical Rec #:  [id number]         Height:       67.0 in Accession #:    [id number]        Weight:       142.0 lb Date of Birth:  [birth year]         BSA:          1.75 m Patient Age:    59 years          BP:           154/109 mmHg Patient Gender: M                 HR:           74 bpm. Exam Location:  Inpatient Procedure: 2D Echo, Cardiac Doppler and Color Doppler Indications:    I48.91* Unspeicified atrial fibrillation  History:        Patient has no prior history of Echocardiogram examinations.                 Abnormal ECG, Arrythmias:Atrial Fibrillation; Risk                 Factors:Hypertension. Elevated troponin.  Sonographer:    Sheralyn Boatman RDCS Referring Phys: [id number] JAMES KIM IMPRESSIONS  1. Left ventricular ejection fraction, by estimation, is 45 to 50%. The left ventricle has mildly decreased function. The left ventricle demonstrates global hypokinesis. Left ventricular diastolic function could not be evaluated.  2. Right ventricular systolic  function is low normal. The right ventricular size is normal. There is mildly elevated pulmonary artery systolic pressure.  3. The mitral valve is grossly normal. Mild mitral valve regurgitation.  4. Tricuspid valve regurgitation is mild to moderate.  5. The aortic valve is tricuspid. Aortic valve  regurgitation is mild. No aortic stenosis is present.  6. There is mild dilatation of the ascending aorta measuring 39 mm.  7. The inferior vena cava is dilated in size with >50% respiratory variability, suggesting right atrial pressure of 8 mmHg. Comparison(s): No prior Echocardiogram. FINDINGS  Left Ventricle: Left ventricular ejection fraction, by estimation, is 45 to 50%. The left ventricle has mildly decreased function. The left ventricle demonstrates global hypokinesis. The left ventricular internal cavity size was normal in size. There is  no left ventricular hypertrophy. The left ventricular diastology could not be evaluated due to atrial fibrillation. Left ventricular diastolic function could not be evaluated. Right Ventricle: The right ventricular size is normal. No increase in right ventricular wall thickness. Right ventricular systolic function is low normal. There is mildly elevated pulmonary artery systolic pressure. The tricuspid regurgitant velocity is 2.48 m/s, and with an assumed right atrial pressure of 8 mmHg, the estimated right ventricular systolic pressure is 32.6 mmHg. Left Atrium: Left atrial size was normal in size. Right Atrium: Right atrial size was normal in size. Pericardium: Trivial pericardial effusion is present. Presence of pericardial fat pad. Mitral Valve: The mitral valve is grossly normal. Mild mitral valve regurgitation. Tricuspid Valve: The tricuspid valve is grossly normal. Tricuspid valve regurgitation is mild to moderate. Aortic Valve: The aortic valve is tricuspid. Aortic valve regurgitation is mild. Aortic regurgitation PHT measures 248 msec. No aortic stenosis is present.  Pulmonic Valve: The pulmonic valve was grossly normal. Pulmonic valve regurgitation is not visualized. No evidence of pulmonic stenosis. Aorta: The aortic root is normal in size and structure. There is mild dilatation of the ascending aorta measuring 39 mm. Venous: The inferior vena cava is dilated in size with greater than 50% respiratory variability, suggesting right atrial pressure of 8 mmHg. IAS/Shunts: No atrial level shunt detected by color flow Doppler.  LEFT VENTRICLE PLAX 2D LVIDd:         4.90 cm LVIDs:         3.80 cm LV PW:         1.30 cm LV IVS:        1.00 cm LVOT diam:     2.50 cm LV SV:         81.98 ml LV SV Index:   29.12 LVOT Area:     4.91 cm  LV Volumes (MOD) LV vol d, MOD A2C: 116.0 ml LV vol d, MOD A4C: 114.0 ml LV vol s, MOD A2C: 72.5 ml LV vol s, MOD A4C: 58.5 ml LV SV MOD A2C:     43.5 ml LV SV MOD A4C:     114.0 ml LV SV MOD BP:      45.1 ml RIGHT VENTRICLE         IVC TAPSE (M-mode): 1.5 cm  IVC diam: 2.10 cm LEFT ATRIUM             Index       RIGHT ATRIUM           Index LA diam:        4.40 cm 2.52 cm/m  RA Area:     19.00 cm LA Vol (A2C):   51.0 ml 29.17 ml/m RA Volume:   55.20 ml  31.57 ml/m LA Vol (A4C):   47.3 ml 27.05 ml/m LA Biplane Vol: 51.2 ml 29.29 ml/m  AORTIC VALVE LVOT Vmax:   79.80 cm/s LVOT Vmean:  55.800 cm/s LVOT VTI:    0.167 m AI PHT:  248 msec  AORTA Ao Root diam: 3.40 cm Ao Asc diam:  3.60 cm MITRAL VALVE                 TRICUSPID VALVE MV Area (PHT): 4.67 cm      TR Peak grad:   24.6 mmHg MV Decel Time: 162 msec      TR Vmax:        248.00 cm/s MR Peak grad:    81.7 mmHg MR Mean grad:    58.0 mmHg   SHUNTS MR Vmax:         452.00 cm/s Systemic VTI:  0.17 m MR Vmean:        359.5 cm/s  Systemic Diam: 2.50 cm MR PISA:         0.57 cm MR PISA Eff ROA: 4 mm MR PISA Radius:  0.30 cm MV E velocity: 103.00 cm/s Eleonore Chiquito MD Electronically signed by Eleonore Chiquito MD Signature Date/Time: 05/07/2019/5:34:05 PM    Final       Scheduled Meds: . apixaban   5 mg Oral BID  . [START ON 05/10/2019] metoprolol succinate  200 mg Oral Daily  . sodium chloride flush  3 mL Intravenous Once   Continuous Infusions:   LOS: 2 days    Time spent: 25 minutes   Domenic Polite, MD [facility] 05/09/2019, 12:09 PM

## 2019-05-09 NOTE — Progress Notes (Signed)
   05/09/19 1430  Mobility  Activity Ambulated in room;Transferred:  Bed to chair  Range of Motion Active;All extremities  Level of Assistance Independent  Assistive Device None  Mobility Response Tolerated well

## 2019-05-09 NOTE — Evaluation (Signed)
Physical Therapy Evaluation Patient Details Name: Seth Snyder MRN: [id number] DOB: Dec 12, 1960 Today's Date: 05/09/2019   History of Present Illness  Pt is 60 yo male with PMH significant for paroxysmal afib, hypothyroidism, and HTN.  Pt admitted with afib RVR.  Per hospitalist note pt with R shoulder pain with neuropathy and PT consulted.  Clinical Impression   Pt admitted with above diagnosis.  In regards to basic mobility, pt is supervision to min guard for all transfers and activity - would benefit from PT to advance activity tolerance and stair training.   In regards to R shoulder pain:  Pt presenting with normal R UE strength and ROM.  He does have limited neck ROM and increased tone/tension in R shoulder musculature.  Pt had centralization of pain with cervical retraction.  Pt would greatly benefit from outpt PT but has transportation limitations.  He was given HEP.  Will benefit from acute PT while hospitalized and outpt PT if possible - if not HHPT. Pt currently with functional limitations due to the deficits listed below (see PT Problem List). Pt will benefit from skilled PT to increase their independence and safety with mobility to allow discharge to the venue listed below.       Follow Up Recommendations Outpatient PT(needs outpt PT for subacute shoulder/neck pain but may need HHPT if can't get transportation)    Equipment Recommendations  None recommended by PT    Recommendations for Other Services       Precautions / Restrictions Precautions Precautions: None      Mobility  Bed Mobility Overal bed mobility: Independent                Transfers Overall transfer level: Needs assistance Equipment used: None Transfers: Sit to/from Stand;Stand Pivot Transfers Sit to Stand: Supervision Stand pivot transfers: Supervision          Ambulation/Gait Ambulation/Gait assistance: Min guard Gait Distance (Feet): 200 Feet Assistive device: None        General Gait Details: slow but steady and normal gait pattern; did report increased pain in neck/shoulder with walking  Stairs            Wheelchair Mobility    Modified Rankin (Stroke Patients Only)       Balance Overall balance assessment: No apparent balance deficits (not formally assessed)                                           Pertinent Vitals/Pain Pain Assessment: 0-10 Pain Score: 3  Pain Location: R upper trap to upper arm Pain Descriptors / Indicators: Burning Pain Intervention(s): Limited activity within patient's tolerance;Repositioned;Monitored during session;Relaxation;Utilized relaxation techniques;Other (comment)(HEP)    Home Living Family/patient expects to be discharged to:: Private residence Living Arrangements: Children(who work during day) Available Help at Discharge: Family;Available PRN/intermittently Type of Home: House Home Access: Stairs to enter Entrance Stairs-Rails: Doctor, general practice of Steps: 4 Home Layout: Two level Home Equipment: None      Prior Function Level of Independence: Needs assistance   Gait / Transfers Assistance Needed: Pt reports he could do short distance community ambulation but did not go out due to COVID.  ADL's / Homemaking Assistance Needed: Does not drive.  Reports independent with ADLs and family performed IADLS  Comments: In regards to shoulder pain:  Pt reporting started in ~ September of 2020.  He states that pain  is constantly there but varies From a 3/10 to 8/10 burning pain.  Does reports numbness/tingling at times.  At time pain is just in posterior R neck/upper trap but at times radiates to R upper arm and 4th digit, does not ever feel R lower arm.  Pt reports pain meds help some but states no other positions or ice/heat affect that he is aware of.  Cervical xray done this hospitalization: Seven cervical segments are well visualized. Mild osteophytic changes are noted  most prominent at C4-5. Mild neural foraminal narrowing is seen bilaterally. The odontoid is within normal limits. No acute fracture or acute facet abnormality is seen. No soft tissue changes are noted.     Hand Dominance        Extremity/Trunk Assessment   Upper Extremity Assessment Upper Extremity Assessment: RUE deficits/detail RUE Deficits / Details: ROM and MMT 5/5; did note pt tends to elevate R shoulder/tense upper trap; no pain with palpation LUE Deficits / Details: Normal         Cervical / Trunk Assessment Cervical / Trunk Assessment: Other exceptions Cervical / Trunk Exceptions: Pt with limited neck ROM and tends to have forward head.  Pain in R upper trap, posterior neck, and upper shoulder increased with neck ext, R rotation, and L lateral flexion.  Reports pain in neck increased with cervical retraction but pain in upper arm decreased.  Communication      Cognition Arousal/Alertness: Awake/alert Behavior During Therapy: WFL for tasks assessed/performed Overall Cognitive Status: Within Functional Limits for tasks assessed                                        General Comments General comments (skin integrity, edema, etc.): Pt was educated on PT assessment including limited neck ROM and increased tension in upper traps.  Educated on centralization vs peripherlization of pain.  Discussed recommend Outpt PT but pt reports transportation and insurance concerns.  Gave pt HEP and will cont to work in hospital.   HEP per below: Access Code: [facility]  URL: [email or web address]  Date: 05/09/2019  Prepared by: Royetta Asal  Exercises Seated Cervical Retraction - 10 reps - 1 sets - 3x daily - 7x weekly Seated Gentle Upper Trapezius Stretch - 3 reps - 1 sets - 30 seconds hold - 3x daily - 7x weekly Seated Scapular Retraction - 10 reps - 2 sets - 3x daily - 7x weekly   Exercises Other Exercises Other Exercises: Upper trap stretch for R  with 30 sec hold Other Exercises: Cervical retraction x 10 with cues Other Exercises: Scapular retraction x 10   Assessment/Plan    PT Assessment Patient needs continued PT services  PT Problem List Decreased mobility;Decreased range of motion;Pain;Decreased knowledge of use of DME;Decreased strength       PT Treatment Interventions Therapeutic activities;Modalities;Gait training;Therapeutic exercise;Patient/family education;Stair training;Manual techniques    PT Goals (Current goals can be found in the Care Plan section)  Acute Rehab PT Goals Patient Stated Goal: decrease pain PT Goal Formulation: With patient Time For Goal Achievement: 05/23/19 Potential to Achieve Goals: Good    Frequency Min 3X/week   Barriers to discharge   no transportation to outpt PT    Co-evaluation               AM-PAC PT "6 Clicks" Mobility  Outcome Measure Help needed turning from your back to your side while in  a flat bed without using bedrails?: None Help needed moving from lying on your back to sitting on the side of a flat bed without using bedrails?: None Help needed moving to and from a bed to a chair (including a wheelchair)?: None Help needed standing up from a chair using your arms (e.g., wheelchair or bedside chair)?: None Help needed to walk in hospital room?: None Help needed climbing 3-5 steps with a railing? : A Little 6 Click Score: 23    End of Session   Activity Tolerance: Patient tolerated treatment well Patient left: in chair;with call bell/phone within reach Nurse Communication: Mobility status PT Visit Diagnosis: Pain Pain - Right/Left: Right Pain - part of body: Shoulder;Arm(neck)    Time: 9485-4627 PT Time Calculation (min) (ACUTE ONLY): 33 min   Charges:   PT Evaluation $PT Eval Low Complexity: 1 Low PT Treatments $Therapeutic Exercise: 8-22 mins        Maggie Font, PT Acute Rehab Services Pager [phone] [facility] [phone] [facility] 05/09/2019, 3:39 PM

## 2019-05-09 NOTE — Progress Notes (Addendum)
TEE/DCCV able to be scheduled for tomorrow at 8am. Orders written per Dr. Leonides Sake request who confirmed this AM she discussed procedure consent, risks, benefits with patient today. Relayed plan to nurse and requested she utilize translation service when patient is signing consent so that he is aware that is what signature is for. He will be NPO after midnight. Darsha Zumstein PA-C

## 2019-05-09 NOTE — H&P (View-Only) (Signed)
Progress Note  Patient Name: Seth Snyder Date of Encounter: 05/09/2019  Primary Cardiologist: Chilton Si, MD   Subjective   Continues to have shoulder pain and intermittent shortness of breath.    Inpatient Medications    Scheduled Meds: . apixaban  5 mg Oral BID  . metoprolol succinate  100 mg Oral Once  . [START ON 05/10/2019] metoprolol succinate  200 mg Oral Daily  . sodium chloride flush  3 mL Intravenous Once   Continuous Infusions:  PRN Meds: acetaminophen **OR** acetaminophen, gabapentin   Vital Signs    Vitals:   05/08/19 1934 05/09/19 0654 05/09/19 0913 05/09/19 0918  BP: 108/84 111/81 (!) 148/115 (!) 133/107  Pulse: 88 89 94 81  Resp:    16  Temp: 98 F (36.7 C) 97.8 F (36.6 C)  97.9 F (36.6 C)  TempSrc: Oral Oral  Oral  SpO2: 96% 97% 98% 96%  Weight:  66.8 kg    Height:        Intake/Output Summary (Last 24 hours) at 05/09/2019 1000 Last data filed at 05/08/2019 1940 Gross per 24 hour  Intake 1059.56 ml  Output --  Net 1059.56 ml   Last 3 Weights 05/09/2019 05/07/2019  Weight (lbs) 147 lb 3.2 oz 142 lb  Weight (kg) 66.769 kg 64.411 kg      Telemetry    Atrial fibrillation.  Rates mostly <100 bpm.  Intermittently 100-120s.  - Personally Reviewed  ECG    n/a - Personally Reviewed  Physical Exam   VS:  BP (!) 133/107 (BP Location: Right Arm)   Pulse 81   Temp 97.9 F (36.6 C) (Oral)   Resp 16   Ht 5\' 7"  (1.702 m)   Wt 66.8 kg   SpO2 96%   BMI 23.05 kg/m  , BMI Body mass index is 23.05 kg/m. GENERAL:  Well appearing HEENT: Pupils equal round and reactive, fundi not visualized, oral mucosa unremarkable NECK:  No jugular venous distention, waveform within normal limits, carotid upstroke brisk and symmetric, no bruits, no thyromegaly LYMPHATICS:  No cervical adenopathy LUNGS:  Clear to auscultation bilaterally HEART:  Irregularly irregular.  PMI not displaced or sustained,S1 and S2 within normal limits, no S3, no S4, no  clicks, no rubs, no murmurs ABD:  Flat, positive bowel sounds normal in frequency in pitch, no bruits, no rebound, no guarding, no midline pulsatile mass, no hepatomegaly, no splenomegaly EXT:  2 plus pulses throughout, no edema, no cyanosis no clubbing SKIN:  No rashes no nodules NEURO:  Cranial nerves II through XII grossly intact, motor grossly intact throughout PSYCH:  Cognitively intact, oriented to person place and time   Labs    High Sensitivity Troponin:   Recent Labs  Lab 05/07/19 0430 05/07/19 0822  TROPONINIHS 21* 20*      Chemistry Recent Labs  Lab 05/07/19 0430 05/08/19 0557 05/09/19 0329  NA 139 138 135  K 3.9 3.0* 4.0  CL 108 103 99  CO2 18* 24 21*  GLUCOSE 97 104* 98  BUN 14 19 25*  CREATININE 1.10 1.43* 1.40*  CALCIUM 9.1 9.2 9.2  PROT 7.4 7.2  --   ALBUMIN 4.1 3.8  --   AST 38 33  --   ALT 36 33  --   ALKPHOS 90 96  --   BILITOT 1.2 1.7*  --   GFRNONAA >60 54* 55*  GFRAA >60 >60 >60  ANIONGAP 13 11 15      Hematology Recent Labs  Lab 05/07/19 0430 05/08/19 0557  WBC 9.2 6.2  RBC 4.88 5.11  HGB 13.3 14.0  HCT 41.1 41.2  MCV 84.2 80.6  MCH 27.3 27.4  MCHC 32.4 34.0  RDW 15.3 14.9  PLT 167 195    BNP Recent Labs  Lab 05/07/19 0430  BNP 733.8*     DDimer No results for input(s): DDIMER in the last 168 hours.   Radiology    DG Cervical Spine Complete  Result Date: 05/07/2019 CLINICAL DATA:  Neck pain for 1 day, no known injury, initial encounter EXAM: CERVICAL SPINE - COMPLETE 4+ VIEW COMPARISON:  None. FINDINGS: Seven cervical segments are well visualized. Mild osteophytic changes are noted most prominent at C4-5. Mild neural foraminal narrowing is seen bilaterally. The odontoid is within normal limits. No acute fracture or acute facet abnormality is seen. No soft tissue changes are noted. IMPRESSION: Mild degenerative change without acute abnormality. Electronically Signed   By: Alcide Clever M.D.   On: 05/07/2019 15:45   DG  Shoulder Right  Result Date: 05/07/2019 CLINICAL DATA:  Right shoulder pain for 1 day, no known injury, initial encounter EXAM: RIGHT SHOULDER - 2+ VIEW COMPARISON:  None. FINDINGS: There is no evidence of fracture or dislocation. There is no evidence of arthropathy or other focal bone abnormality. Soft tissues are unremarkable. IMPRESSION: No acute abnormality noted. Electronically Signed   By: Alcide Clever M.D.   On: 05/07/2019 15:45   ECHOCARDIOGRAM COMPLETE  Result Date: 05/07/2019    ECHOCARDIOGRAM REPORT   Patient Name:   Seth Snyder Date of Exam: 05/07/2019 Medical Rec #:  [id number]         Height:       67.0 in Accession #:    [id number]        Weight:       142.0 lb Date of Birth:  [birth year]         BSA:          1.75 m Patient Age:    59 years          BP:           154/109 mmHg Patient Gender: M                 HR:           74 bpm. Exam Location:  Inpatient Procedure: 2D Echo, Cardiac Doppler and Color Doppler Indications:    I48.91* Unspeicified atrial fibrillation  History:        Patient has no prior history of Echocardiogram examinations.                 Abnormal ECG, Arrythmias:Atrial Fibrillation; Risk                 Factors:Hypertension. Elevated troponin.  Sonographer:    Sheralyn Boatman RDCS Referring Phys: [id number] JAMES KIM IMPRESSIONS  1. Left ventricular ejection fraction, by estimation, is 45 to 50%. The left ventricle has mildly decreased function. The left ventricle demonstrates global hypokinesis. Left ventricular diastolic function could not be evaluated.  2. Right ventricular systolic function is low normal. The right ventricular size is normal. There is mildly elevated pulmonary artery systolic pressure.  3. The mitral valve is grossly normal. Mild mitral valve regurgitation.  4. Tricuspid valve regurgitation is mild to moderate.  5. The aortic valve is tricuspid. Aortic valve regurgitation is mild. No aortic stenosis is present.  6. There is mild dilatation of the ascending aorta  measuring  39 mm.  7. The inferior vena cava is dilated in size with >50% respiratory variability, suggesting right atrial pressure of 8 mmHg. Comparison(s): No prior Echocardiogram. FINDINGS  Left Ventricle: Left ventricular ejection fraction, by estimation, is 45 to 50%. The left ventricle has mildly decreased function. The left ventricle demonstrates global hypokinesis. The left ventricular internal cavity size was normal in size. There is  no left ventricular hypertrophy. The left ventricular diastology could not be evaluated due to atrial fibrillation. Left ventricular diastolic function could not be evaluated. Right Ventricle: The right ventricular size is normal. No increase in right ventricular wall thickness. Right ventricular systolic function is low normal. There is mildly elevated pulmonary artery systolic pressure. The tricuspid regurgitant velocity is 2.48 m/s, and with an assumed right atrial pressure of 8 mmHg, the estimated right ventricular systolic pressure is 32.6 mmHg. Left Atrium: Left atrial size was normal in size. Right Atrium: Right atrial size was normal in size. Pericardium: Trivial pericardial effusion is present. Presence of pericardial fat pad. Mitral Valve: The mitral valve is grossly normal. Mild mitral valve regurgitation. Tricuspid Valve: The tricuspid valve is grossly normal. Tricuspid valve regurgitation is mild to moderate. Aortic Valve: The aortic valve is tricuspid. Aortic valve regurgitation is mild. Aortic regurgitation PHT measures 248 msec. No aortic stenosis is present. Pulmonic Valve: The pulmonic valve was grossly normal. Pulmonic valve regurgitation is not visualized. No evidence of pulmonic stenosis. Aorta: The aortic root is normal in size and structure. There is mild dilatation of the ascending aorta measuring 39 mm. Venous: The inferior vena cava is dilated in size with greater than 50% respiratory variability, suggesting right atrial pressure of 8 mmHg. IAS/Shunts:  No atrial level shunt detected by color flow Doppler.  LEFT VENTRICLE PLAX 2D LVIDd:         4.90 cm LVIDs:         3.80 cm LV PW:         1.30 cm LV IVS:        1.00 cm LVOT diam:     2.50 cm LV SV:         81.98 ml LV SV Index:   29.12 LVOT Area:     4.91 cm  LV Volumes (MOD) LV vol d, MOD A2C: 116.0 ml LV vol d, MOD A4C: 114.0 ml LV vol s, MOD A2C: 72.5 ml LV vol s, MOD A4C: 58.5 ml LV SV MOD A2C:     43.5 ml LV SV MOD A4C:     114.0 ml LV SV MOD BP:      45.1 ml RIGHT VENTRICLE         IVC TAPSE (M-mode): 1.5 cm  IVC diam: 2.10 cm LEFT ATRIUM             Index       RIGHT ATRIUM           Index LA diam:        4.40 cm 2.52 cm/m  RA Area:     19.00 cm LA Vol (A2C):   51.0 ml 29.17 ml/m RA Volume:   55.20 ml  31.57 ml/m LA Vol (A4C):   47.3 ml 27.05 ml/m LA Biplane Vol: 51.2 ml 29.29 ml/m  AORTIC VALVE LVOT Vmax:   79.80 cm/s LVOT Vmean:  55.800 cm/s LVOT VTI:    0.167 m AI PHT:      248 msec  AORTA Ao Root diam: 3.40 cm Ao Asc diam:  3.60 cm MITRAL  VALVE                 TRICUSPID VALVE MV Area (PHT): 4.67 cm      TR Peak grad:   24.6 mmHg MV Decel Time: 162 msec      TR Vmax:        248.00 cm/s MR Peak grad:    81.7 mmHg MR Mean grad:    58.0 mmHg   SHUNTS MR Vmax:         452.00 cm/s Systemic VTI:  0.17 m MR Vmean:        359.5 cm/s  Systemic Diam: 2.50 cm MR PISA:         0.57 cm MR PISA Eff ROA: 4 mm MR PISA Radius:  0.30 cm MV E velocity: 103.00 cm/s Eleonore Chiquito MD Electronically signed by Eleonore Chiquito MD Signature Date/Time: 05/07/2019/5:34:05 PM    Final     Cardiac Studies   Echo 05/07/19 IMPRESSIONS   1. Left ventricular ejection fraction, by estimation, is 45 to 50%. The  left ventricle has mildly decreased function. The left ventricle  demonstrates global hypokinesis. Left ventricular diastolic function could  not be evaluated.  2. Right ventricular systolic function is low normal. The right  ventricular size is normal. There is mildly elevated pulmonary artery  systolic  pressure.  3. The mitral valve is grossly normal. Mild mitral valve regurgitation.  4. Tricuspid valve regurgitation is mild to moderate.  5. The aortic valve is tricuspid. Aortic valve regurgitation is mild. No  aortic stenosis is present.  6. There is mild dilatation of the ascending aorta measuring 39 mm.  7. The inferior vena cava is dilated in size with >50% respiratory  variability, suggesting right atrial pressure of 8 mmHg.   Patient Profile     59 y.o. male with paroxysmal atrial fibrillation, chronic systolic and diastolic heart failure, tobacco abuse and hypertension here with shortness of breath and R neck/shoulder pain.    Assessment & Plan    # Atrial fibrillation with RVR: Rates are mostly controlled.  However it continues to go into the 100s at rest at times.  He continues to have shortness of breath which is likely 2/2 afib.  We will increase metoprolol to 200 mg daily.  Continue Eliquis.  He reports taking this as prescribed, though he was not taking all his other medications.  We will plan for TEE/cardioversion.  Unfortunately there is no room on the schedule today, especially given that he is already eating.  We will try to make this happen tomorrow.  Given that this is his second episode in 2 months, he will likely need an antiarrhythmic.  He does not currently have insurance, which will make this challenging.  Ideally he would not be on amiodarone given his age.  He has subclinical hypothyroidism with elevated TSH and normal free T4.  We can consider flecainide, though he will need a stress test.  # Hypertension: Metoprolol as above.  # Chronic systolic and diastolic heart failure: LVEF 45 to 50% on echo.  Blood pressure is borderline on metoprolol.  Home hydrochlorothiazide has been discontinued.  Renal function is slightly worse.  Once it normalizes would use Lasix for volume control as needed.  Rate control and cardioversion as above.  # Demand  ischemia: High-sensitivity troponin was mildly elevated and flat at 21.  Stop aspirin as he is on Eliquis.  We will stop atorvastatin as his LDL was 88 and he does not have  any known ASCVD.  # Neck/shoulder pain: Mild arthritic changes noted in the neck.  Symptoms seem to have improved significantly with Toradol.  Will give 1 more dose.  For questions or updates, please contact [facility] Please consult [email or web address] for contact info under        Signed, Chilton Si, MD  05/09/2019, 10:00 AM

## 2019-05-10 ENCOUNTER — Inpatient Hospital Stay (HOSPITAL_COMMUNITY): Payer: Self-pay

## 2019-05-10 ENCOUNTER — Encounter (HOSPITAL_COMMUNITY): Payer: Self-pay | Admitting: Internal Medicine

## 2019-05-10 ENCOUNTER — Encounter (HOSPITAL_COMMUNITY)
Admission: EM | Disposition: A | Discharge status: Home / Self Care | Payer: Self-pay | Source: Home / Self Care | Attending: Internal Medicine

## 2019-05-10 ENCOUNTER — Inpatient Hospital Stay (HOSPITAL_COMMUNITY): Payer: Self-pay | Admitting: Certified Registered Nurse Anesthetist

## 2019-05-10 ENCOUNTER — Telehealth: Payer: Self-pay | Admitting: General Practice

## 2019-05-10 DIAGNOSIS — I5042 Chronic combined systolic (congestive) and diastolic (congestive) heart failure: Secondary | ICD-10-CM

## 2019-05-10 DIAGNOSIS — I361 Nonrheumatic tricuspid (valve) insufficiency: Secondary | ICD-10-CM

## 2019-05-10 DIAGNOSIS — I351 Nonrheumatic aortic (valve) insufficiency: Secondary | ICD-10-CM

## 2019-05-10 DIAGNOSIS — M25519 Pain in unspecified shoulder: Secondary | ICD-10-CM

## 2019-05-10 DIAGNOSIS — I4891 Unspecified atrial fibrillation: Secondary | ICD-10-CM

## 2019-05-10 DIAGNOSIS — M25511 Pain in right shoulder: Secondary | ICD-10-CM

## 2019-05-10 DIAGNOSIS — I340 Nonrheumatic mitral (valve) insufficiency: Secondary | ICD-10-CM

## 2019-05-10 HISTORY — PX: TEE WITHOUT CARDIOVERSION: SHX5443

## 2019-05-10 HISTORY — PX: CARDIOVERSION: SHX1299

## 2019-05-10 LAB — BASIC METABOLIC PANEL
Anion gap: 10 (ref 5–15)
BUN: 17 mg/dL (ref 6–20)
CO2: 20 mmol/L — ABNORMAL LOW (ref 22–32)
Calcium: 9.2 mg/dL (ref 8.9–10.3)
Chloride: 106 mmol/L (ref 98–111)
Creatinine, Ser: 1.17 mg/dL (ref 0.61–1.24)
GFR calc Af Amer: >60 mL/min (ref >60)
GFR calc non Af Amer: >60 mL/min (ref >60)
Glucose, Bld: 106 mg/dL — ABNORMAL HIGH (ref 70–99)
Potassium: 4.4 mmol/L (ref 3.5–5.1)
Sodium: 136 mmol/L (ref 135–145)

## 2019-05-10 LAB — CBC
HCT: 43.1 % (ref 39.0–52.0)
Hemoglobin: 14.1 g/dL (ref 13.0–17.0)
MCH: 27.2 pg (ref 26.0–34.0)
MCHC: 32.7 g/dL (ref 30.0–36.0)
MCV: 83.2 fL (ref 80.0–100.0)
Platelets: 234 10*3/uL (ref 150–400)
RBC: 5.18 MIL/uL (ref 4.22–5.81)
RDW: 15.4 % (ref 11.5–15.5)
WBC: 7 10*3/uL (ref 4.0–10.5)
nRBC: 0 % (ref 0.0–0.2)

## 2019-05-10 SURGERY — ECHOCARDIOGRAM, TRANSESOPHAGEAL
Anesthesia: General

## 2019-05-10 MED ORDER — PROPOFOL 10 MG/ML IV BOLUS
INTRAVENOUS | Status: DC | PRN
  Administered 2019-05-10 (×2): 10 mg via INTRAVENOUS

## 2019-05-10 MED ORDER — SODIUM CHLORIDE 0.9 % IV SOLN: INTRAVENOUS | Status: DC | PRN

## 2019-05-10 MED ORDER — CYCLOBENZAPRINE HCL 10 MG PO TABS: 5.0000 mg | ORAL_TABLET | Freq: Two times a day (BID) | ORAL | Status: DC | PRN

## 2019-05-10 MED ORDER — ATORVASTATIN CALCIUM 40 MG PO TABS
40.0000 mg | ORAL_TABLET | Freq: Every day | ORAL | Status: DC
  Administered 2019-05-10: 17:00:00 40 mg via ORAL
  Filled 2019-05-10: qty 1

## 2019-05-10 MED ORDER — PROPOFOL 500 MG/50ML IV EMUL
INTRAVENOUS | Status: DC | PRN
  Administered 2019-05-10: 100 ug/kg/min via INTRAVENOUS

## 2019-05-10 MED ORDER — METOPROLOL SUCCINATE ER 100 MG PO TB24: 200.0000 mg | ORAL_TABLET | Freq: Every day | ORAL | 0 refills | Status: DC

## 2019-05-10 MED ORDER — CYCLOBENZAPRINE HCL 5 MG PO TABS: 5.0000 mg | ORAL_TABLET | Freq: Two times a day (BID) | ORAL | 0 refills | Status: DC | PRN

## 2019-05-10 MED ORDER — ATORVASTATIN CALCIUM 40 MG PO TABS: 40.0000 mg | ORAL_TABLET | Freq: Every day | ORAL | 0 refills | Status: DC

## 2019-05-10 MED FILL — CYCLOBENZAPRINE HCL 5 MG TA: 5 | 10 days supply | Qty: 20 | Fill #0

## 2019-05-10 MED FILL — METOPROLOL SUCCINATE ER 100: 100 | 30 days supply | Qty: 60 | Fill #0

## 2019-05-10 MED FILL — ATORVASTATIN CALCIUM 40 MG: 40 | 30 days supply | Qty: 30 | Fill #0

## 2019-05-10 NOTE — Transfer of Care (Signed)
Immediate Anesthesia Transfer of Care Note  Patient: Seth Snyder  Procedure(s) Performed: TRANSESOPHAGEAL ECHOCARDIOGRAM (TEE) (N/A ) CARDIOVERSION (N/A )  Patient Location: Endoscopy Unit  Anesthesia Type:General  Level of Consciousness: awake and alert   Airway & Oxygen Therapy: Patient Spontanous Breathing  Post-op Assessment: Report given to RN, Post -op Vital signs reviewed and stable and Patient moving all extremities X 4  Post vital signs: Reviewed and stable  Last Vitals:  Vitals Value Taken Time  BP 127/96 05/10/19 0837  Temp    Pulse 61 05/10/19 0837  Resp 13 05/10/19 0837  SpO2 94 % 05/10/19 0837  Vitals shown include unvalidated device data.  Last Pain:  Vitals:   05/10/19 0731  TempSrc: Temporal  PainSc: 0-No pain      Patients Stated Pain Goal: 0 (05/09/19 1328)  Complications: No apparent anesthesia complications

## 2019-05-10 NOTE — Anesthesia Procedure Notes (Signed)
Procedure Name: MAC Date/Time: 05/10/2019 8:00 AM Performed by: Harden Mo, CRNA Pre-anesthesia Checklist: Patient identified, Emergency Drugs available, Suction available and Patient being monitored Patient Re-evaluated:Patient Re-evaluated prior to induction Oxygen Delivery Method: Nasal cannula Preoxygenation: Pre-oxygenation with 100% oxygen Induction Type: IV induction Placement Confirmation: positive ETCO2 and breath sounds checked- equal and bilateral Dental Injury: Teeth and Oropharynx as per pre-operative assessment

## 2019-05-10 NOTE — TOC Transition Note (Signed)
Transition of Care [facility]) - CM/SW Discharge Note Donn Pierini RN, BSN Transitions of Care Unit 4E- RN Case Manager [phone]   Patient Details  Name: Seth Snyder MRN: [id number] Date of Birth: [birth year]  Transition of Care [facility]) CM/SW Contact:  Darrold Span, RN Phone Number: 05/10/2019, 3:32 PM   Clinical Narrative:    Pt admitted with afib, has been on Eliquis PTA and has some stored in [facility] that will need to be given back to pt on discharge. Pt reports he moved he from [place] To be near daughter. Pt states he has contacted DSS here to start process with Medicaid but seems overwhelmed with the online or phone process due to COVID and not being able to do in person. Will have CSW provide pt with resources and applications that he can mail in. Pt states he had "state" insurance when he lived in [place]. Prior to moving here but of coarse he lost that when he moved to [facility]. Pt is also asking for assistance with paying for his rent- which hospital is unable to provide nor is there any assistance programs for that- he was provided with info to give daughter if she is having difficulty with rent. Pt also was provided with Eliquis pt assistance application as he will need to apply for assistance before he runs out of his current supply that he received when he was in [place] Pt also confirms that he is getting food stamps. F/u appointment set [facility]- info provided to pt and placed on AVS. Pt will also be assisted with medications through [facility]- will need to have scripts sent to [facility]- so that meds can be delivered to bedside prior to discharge [facility] pharmacy closes at 5pm- so scripts need to be sent in time for them to process and deliver for discharge)-  Pt reports that his daughter can provide transport home.    Final next level of care: Home/Self Care Barriers to Discharge: No Barriers Identified   Patient Goals and CMS Choice Patient states their  goals for this hospitalization and ongoing recovery are:: return home with daughter   Choice offered to / list presented to : NA  Discharge Placement               Home        Discharge Plan and Services In-house Referral: Clinical Social Work Discharge Planning Services: CM Consult Post Acute Care Choice: NA          DME Arranged: N/A DME Agency: NA       HH Arranged: NA HH Agency: NA        Social Determinants of Health (SDOH) Interventions     Readmission Risk Interventions Readmission Risk Prevention Plan 05/10/2019  Post Dischage Appt Complete  Medication Screening Complete  Transportation Screening Complete

## 2019-05-10 NOTE — Progress Notes (Signed)
  Echocardiogram 2D Echocardiogram has been performed.  Celene Skeen 05/10/2019, 8:39 AM

## 2019-05-10 NOTE — Progress Notes (Signed)
PROGRESS NOTE    Seth Snyder  NOB:[id number] DOB: 1961/03/17 DOA: 05/07/2019 PCP: Patient, No Pcp Per    Brief Narrative:  Seth Snyder is a 59 yo male with past medical history significant for paroxysmal atrial fibrillation, hypothyroidism, hypertension who was diagnosed with A. fib at [facility] in Nov 2020.  During that admission, patient underwent cardioversion and was started on baby aspirin, Eliquis, metoprolol. He ended up moving to [place] from [place] in late December and has not established with physicians in town yet. He recently ran out of his medications including metoprolol, hydrochlorothiazide and aspirin.  He reports that he has been taking Eliquis.   -Presented to the ED with chest pain, noted to have rapid atrial flutter  -cardiology consulted for A. fib RVR.  New events last 24 hours / Subjective: -Heart rate fluctuating from 90s to 120s range, mild dyspnea -Continues to report right shoulder discomfort  Assessment & Plan:   A. fib RVR -Ran out of his home medications including Toprol, aspirin as outpatient.   -TSH up but free T4 is normal  -Echocardiogram revealed EF 45 to 50%, global hypokinesis left ventricle -Cardiology following -Continue Eliquis, metoprolol.  Stopped aspirin  -just underwent cardioversion, now in NSR -CM consult for med assistance, financial aid  Chronic systolic and diastolic heart failure -BNP 733.8 -Clinically appears close to euvolemic -hold diuretics  AKI -Creatinine bumped to 1.4 yesterday, now improved, HCTZ discontinued, he also got some Toradol for his right shoulder which could have contributed -hold off on diuretics at DC  Right-sided shoulder pain with suspected radiculopathy -Cervical and right shoulder x-ray unremarkable -Patient has tried gabapentin for this in the past without much success, will try low-dose Flexeril, some symptoms concerning for radiculopathy however he is not interested  in Lyrica trial today -PT for exercises   Hypokalemia -Replace, trend   DVT prophylaxis: Eliquis Code Status: Full code Family Communication: No family at bedside Disposition Plan: Home later today or in am  Consultants:   Cardiology  Procedures:   None  Antimicrobials:  Anti-infectives (From admission, onward)   None       Objective: Vitals:   05/10/19 0900 05/10/19 0913 05/10/19 0926 05/10/19 1246  BP: (!) 120/94  (!) 119/93 101/82  Pulse: 61  70 68  Resp: 13 13 14 15   Temp:    98 F (36.7 C)  TempSrc:    Oral  SpO2: 99% 98% 98% 94%  Weight:      Height:        Intake/Output Summary (Last 24 hours) at 05/10/2019 1448 Last data filed at 05/10/2019 0823 Gross per 24 hour  Intake 440 ml  Output --  Net 440 ml   Filed Weights   05/09/19 0654 05/10/19 0259 05/10/19 0731  Weight: 66.8 kg 66.2 kg 66.2 kg    Examination:  Gen: Awake, Alert, Oriented X 3, no distress HEENT: PERRLA, Neck supple, no JVD Lungs: Good air movement bilaterally, CTAB CVS: S1S2/RRR Abd: soft, Non tender, non distended, BS present Extremities: No edema Psychiatry: Judgement and insight appear normal.   Data Reviewed: I have personally reviewed following labs and imaging studies  CBC: Recent Labs  Lab 05/07/19 0430 05/08/19 0557 05/10/19 0313  WBC 9.2 6.2 7.0  HGB 13.3 14.0 14.1  HCT 41.1 41.2 43.1  MCV 84.2 80.6 83.2  PLT 167 195 234   Basic Metabolic Panel: Recent Labs  Lab 05/07/19 0430 05/08/19 0557 05/09/19 0329 05/10/19 0313  NA 139 138  135 136  K 3.9 3.0* 4.0 4.4  CL 108 103 99 106  CO2 18* 24 21* 20*  GLUCOSE 97 104* 98 106*  BUN 14 19 25* 17  CREATININE 1.10 1.43* 1.40* 1.17  CALCIUM 9.1 9.2 9.2 9.2  MG 1.6* 1.9 1.8  --    GFR: Estimated Creatinine Clearance: 65.1 mL/min (by C-G formula based on SCr of 1.17 mg/dL). Liver Function Tests: Recent Labs  Lab 05/07/19 0430 05/08/19 0557  AST 38 33  ALT 36 33  ALKPHOS 90 96  BILITOT 1.2 1.7*  PROT  7.4 7.2  ALBUMIN 4.1 3.8   Recent Labs  Lab 05/07/19 0430  LIPASE 50   No results for input(s): AMMONIA in the last 168 hours. Coagulation Profile: Recent Labs  Lab 05/07/19 0823  INR 1.1   Cardiac Enzymes: No results for input(s): CKTOTAL, CKMB, CKMBINDEX, TROPONINI in the last 168 hours. BNP (last 3 results) No results for input(s): PROBNP in the last 8760 hours. HbA1C: No results for input(s): HGBA1C in the last 72 hours. CBG: No results for input(s): GLUCAP in the last 168 hours. Lipid Profile: No results for input(s): CHOL, HDL, LDLCALC, TRIG, CHOLHDL, LDLDIRECT in the last 72 hours. Thyroid Function Tests: No results for input(s): TSH, T4TOTAL, FREET4, T3FREE, THYROIDAB in the last 72 hours. Anemia Panel: No results for input(s): VITAMINB12, FOLATE, FERRITIN, TIBC, IRON, RETICCTPCT in the last 72 hours. Sepsis Labs: No results for input(s): PROCALCITON, LATICACIDVEN in the last 168 hours.  Recent Results (from the past 240 hour(s))  Respiratory Panel by RT PCR (Flu A&B, Covid) - Nasopharyngeal Swab     Status: None   Collection Time: 05/07/19  6:09 AM   Specimen: Nasopharyngeal Swab  Result Value Ref Range Status   SARS Coronavirus 2 by RT PCR NEGATIVE NEGATIVE Final    Comment: (NOTE) SARS-CoV-2 target nucleic acids are NOT DETECTED. The SARS-CoV-2 RNA is generally detectable in upper respiratoy specimens during the acute phase of infection. The lowest concentration of SARS-CoV-2 viral copies this assay can detect is 131 copies/mL. A negative result does not preclude SARS-Cov-2 infection and should not be used as the sole basis for treatment or other patient management decisions. A negative result may occur with  improper specimen collection/handling, submission of specimen other than nasopharyngeal swab, presence of viral mutation(s) within the areas targeted by this assay, and inadequate number of viral copies (<131 copies/mL). A negative result must be  combined with clinical observations, patient history, and epidemiological information. The expected result is Negative. Fact Sheet for Patients:  PinkCheek.be Fact Sheet for Healthcare Providers:  GravelBags.it This test is not yet ap proved or cleared by the [place] FDA and  has been authorized for detection and/or diagnosis of SARS-CoV-2 by FDA under an Emergency Use Authorization (EUA). This EUA will remain  in effect (meaning this test can be used) for the duration of the COVID-19 declaration under Section 564(b)(1) of the Act, 21 U.S.C. section 360bbb-3(b)(1), unless the authorization is terminated or revoked sooner.    Influenza A by PCR NEGATIVE NEGATIVE Final   Influenza B by PCR NEGATIVE NEGATIVE Final    Comment: (NOTE) The Xpert Xpress SARS-CoV-2/FLU/RSV assay is intended as an aid in  the diagnosis of influenza from Nasopharyngeal swab specimens and  should not be used as a sole basis for treatment. Nasal washings and  aspirates are unacceptable for Xpert Xpress SARS-CoV-2/FLU/RSV  testing. Fact Sheet for Patients: PinkCheek.be Fact Sheet for Healthcare Providers: GravelBags.it This test  is not yet approved or cleared by the [place] and  has been authorized for detection and/or diagnosis of SARS-CoV-2 by  FDA under an Emergency Use Authorization (EUA). This EUA will remain  in effect (meaning this test can be used) for the duration of the  Covid-19 declaration under Section 564(b)(1) of the Act, 21  U.S.C. section 360bbb-3(b)(1), unless the authorization is  terminated or revoked. Performed at [facility], [address], [place], [place] [address]       Radiology Studies: ECHO TEE  Result Date: 05/10/2019    TRANSESOPHOGEAL ECHO REPORT   Patient Name:   LUDWIN FLAHIVE Date of Exam: 05/10/2019 Medical Rec #:  [id number]          Height:       67.0 in Accession #:    [id number]        Weight:       146.0 lb Date of Birth:  [birth year]         BSA:          1.77 m Patient Age:    59 years          BP:           143/103 mmHg Patient Gender: M                 HR:           125 bpm. Exam Location:  Inpatient Procedure: Transesophageal Echo, Cardiac Doppler and Color Doppler                                 MODIFIED REPORT:   This report was modified by Olga Millers MD on 05/10/2019 due to Additional                                   information.  Indications:     atrial fibrillation 427.31  History:         Patient has prior history of Echocardiogram examinations, most                  recent 05/07/2019. Arrythmias:Atrial Fibrillation; Risk                  Factors:Hypertension and Former Smoker.  Sonographer:     Celene Skeen RDCS (AE) Referring Phys:  [id number] Tacey Ruiz DUNN Diagnosing Phys: Olga Millers MD PROCEDURE: The transesophogeal probe was passed without difficulty through the esophogus of the patient. Imaged were obtained with the patient in a left lateral decubitus position. Sedation performed by different physician. Patients was under conscious sedation during this procedure. Image quality was good. The patient developed no complications during the procedure. Pt sedated by anesthesia with diprovan 178 mg IV total. IMPRESSIONS  1. Moderate to severe global reduction in LV systolic function; mild AI and MR; mild LAE; spontaneous contrast in LAA but no thrombus; mild RAE and RVE; moderate TR.  2. Left ventricular ejection fraction, by estimation, is 30 to 35%. The left ventricle has moderately decreased function. The left ventricle demonstrates global hypokinesis. Left ventricular diastolic function could not be evaluated.  3. Right ventricular systolic function is normal. The right ventricular size is mildly enlarged.  4. Left atrial size was mildly dilated. No left atrial/left atrial appendage thrombus was detected.  5. Right atrial size  was mildly  dilated.  6. The mitral valve is normal in structure and function. Mild mitral valve regurgitation.  7. Tricuspid valve regurgitation is moderate.  8. The aortic valve is tricuspid. Aortic valve regurgitation is mild.  9. There is mild (Grade II) plaque involving the descending aorta. Conclusion(s)/Recomendation(s): No LA/LAA thrombus identified. Successful cardioversion performed with restoration of normal sinus rhythm. FINDINGS  Left Ventricle: Left ventricular ejection fraction, by estimation, is 30 to 35%. The left ventricle has moderately decreased function. The left ventricle demonstrates global hypokinesis. The left ventricular internal cavity size was normal in size. There is no left ventricular hypertrophy. Left ventricular diastolic function could not be evaluated. Right Ventricle: The right ventricular size is mildly enlarged. Right ventricular systolic function is normal. Left Atrium: Left atrial size was mildly dilated. Spontaneous echo contrast was present in the left atrial appendage. No left atrial/left atrial appendage thrombus was detected. Right Atrium: Right atrial size was mildly dilated. Pericardium: There is no evidence of pericardial effusion. Mitral Valve: The mitral valve is normal in structure and function. Mild mitral valve regurgitation. Tricuspid Valve: The tricuspid valve is normal in structure. Tricuspid valve regurgitation is moderate. Aortic Valve: The aortic valve is tricuspid. Aortic valve regurgitation is mild. Pulmonic Valve: The pulmonic valve was normal in structure. Pulmonic valve regurgitation is trivial. Aorta: The aortic root is normal in size and structure. There is mild (Grade II) plaque involving the descending aorta. IAS/Shunts: No atrial level shunt detected by color flow Doppler. Additional Comments: Moderate to severe global reduction in LV systolic function; mild AI and MR; mild LAE; spontaneous contrast in LAA but no thrombus; mild RAE and RVE; moderate  TR. Olga Millers MD Electronically signed by Olga Millers MD Signature Date/Time: 05/10/2019/9:17:29 AM    Final (Updated)       Scheduled Meds: . apixaban  5 mg Oral BID  . atorvastatin  40 mg Oral q1800  . metoprolol succinate  200 mg Oral Daily  . sodium chloride flush  3 mL Intravenous Once   Continuous Infusions:   LOS: 3 days    Time spent: 25 minutes   Zannie Cove, MD [facility] 05/10/2019, 2:48 PM

## 2019-05-10 NOTE — Progress Notes (Signed)
    Transesophageal Echocardiogram Note  Seth Snyder [id number] March 17, 1961  Procedure: Transesophageal Echocardiogram Indications: Atrial fibrillation   Procedure Details Consent: Obtained Time Out: Verified patient identification, verified procedure, site/side was marked, verified correct patient position, special equipment/implants available, Radiology Safety Procedures followed,  medications/allergies/relevent history reviewed, required imaging and test results available.  Performed  Medications:  Pt sedated by anesthesia with diprovan 178 mg IV total.  Moderate to severe global reduction in LV systolic function; biatrial enlargement; LAA with spontaneous contrast but no thrombus; mild AI, MR; moderate TR.  Pt subsequently had DCCV with 120 J to sinus rhythm; atrial fibrillation recurred and repeat DCCV with 150J resulted in sinus rhythm; no immediate complications; continue apixaban.   Complications: No apparent complications Patient did tolerate procedure well.  Olga Millers, MD

## 2019-05-10 NOTE — Discharge Summary (Signed)
Physician Discharge Summary  Seth Snyder Tabone ZOX:[id number] DOB: [birth year] DOA: 05/07/2019  PCP: Patient, No Pcp Per  Admit date: 05/07/2019 Discharge date: 05/10/2019  Time spent: 35 minutes  Recommendations for Outpatient Follow-up:  1. Outpatient Cardiology FU, monitor volume status, need for resumption of diuretics 2. PCP at [facility] on 2/24, consider orthopedic referral for right shoulder pain   Discharge Diagnoses:  Principal Problem:   Atrial fibrillation with RVR (HCC) Active Problems:   Hypertension   Hypothyroidism   Hypomagnesemia   Elevated troponin   Acute pulmonary edema (HCC)   Shoulder pain   Chronic combined systolic and diastolic heart failure [facility])   Discharge Condition: Improved  Diet recommendation: Low-sodium, heart healthy  Filed Weights   05/09/19 0654 05/10/19 0259 05/10/19 0731  Weight: 66.8 kg 66.2 kg 66.2 kg    History of present illness: Seth Snyder a 59 yo male with past medicalhistory significant for paroxysmal atrial fibrillation, hypothyroidism, hypertension who was diagnosed with A. fib at [facility] Nov 2020.During that admission, patient underwent cardioversion and was started on baby aspirin, Eliquis, metoprolol. He ended up moving to [place] from [place] in late December and has not established with physicians in town yet. He recently ran out of his medications including metoprolol, hydrochlorothiazide and aspirin. Hereports that he has been taking Eliquis.   -Presented to the ED with chest pain, noted to have   Hospital Course:   Atrial fibrillation with rapid ventricular -Ran out of his home medications including Toprol, aspirin as outpatient.  -TSH up but free T4 is normal  -Echocardiogram revealed EF 45 to 50%, global hypokinesis left ventricle -Cardiology consulted -Continued Eliquis, metoprolol.  Stopped aspirin  -just underwent cardioversion earlier today, now in  [facility] -Case management consulted, social work assisted him in applying for Medicaid and case management was able to set up his prescriptions through our [facility] pharmacy as well as arrange follow-up on 2/  Chronic systolic and diastolic heart failure -Clinically appears close to euvolemic -He did get a dose of IV Lasix this admission, subsequently had mild bump in creatinine, since then he has remained euvolemic and we did not think he needed diuretics at discharge, reassess volume status at follow-up  AKI -Baseline creatinine around 1.0, creatinine bumped to 1.4 yesterday, now improved, HCTZ discontinued, he also got some Toradol for his right shoulder which could have contributed -hold off on diuretics at discharge  Right-sided shoulder pain with suspected radiculopathy -Ongoing problem for 5 to 6 months, cervical and right shoulder x-ray unremarkable -Patient has tried gabapentin for this in the past without much success, trial of low-dose Flexeril, some symptoms concerning for radiculopathy however he is not interested in Lyrica trial today -Recommended orthopedic follow-up for this  Hypokalemia -Replaced   Procedures: TEE, cardioversion on 2/8  Discharge Exam: Vitals:   05/10/19 0926 05/10/19 1246  BP: (!) 119/93 101/82  Pulse: 70 68  Resp: 14 15  Temp:  98 F (36.7 C)  SpO2: 98% 94%    General: AAOx3 Cardiovascular:S1S2/RRR Respiratory: CTAB  Discharge Instructions   Discharge Instructions    Diet - low sodium heart healthy   Complete by: As directed    Increase activity slowly   Complete by: As directed      Allergies as of 05/10/2019   No Known Allergies     Medication List    STOP taking these medications   aspirin EC 81 MG tablet   hydrochlorothiazide 12.5 MG capsule Commonly  known as: MICROZIDE   methocarbamol 500 MG tablet Commonly known as: ROBAXIN     TAKE these medications   acetaminophen 500 MG tablet Commonly known as: TYLENOL Take  500 mg by mouth daily as needed for mild pain.   atorvastatin 40 MG tablet Commonly known as: LIPITOR Take 1 tablet (40 mg total) by mouth daily at 6 PM.   cyclobenzaprine 5 MG tablet Commonly known as: FLEXERIL Take 1 tablet (5 mg total) by mouth 2 (two) times daily as needed for muscle spasms (pain).   Eliquis 5 MG Tabs tablet Generic drug: apixaban Take 5 mg by mouth 2 (two) times daily.   metoprolol succinate 100 MG 24 hr tablet Commonly known as: TOPROL-XL Take 2 tablets (200 mg total) by mouth daily. Take with or immediately following a meal. What changed: how much to take      No Known Allergies Follow-up Information    [facility]. Go on 05/16/2019.   Specialty: Internal Medicine Why: hospital f/u appointment and to establish as new patient- appointment time- 10:20am with Thad Ranger- please bring ID and medication list- (if you need to change appointment please call clinic to re-schedule) Contact information: [address] [phone] [place] [address] [phone]       Ronney Asters, NP Follow up on 05/16/2019.   Specialty: Cardiology Why: Please arrive 15 minutes early for your 2:30pm post-hospital cardiology follow-up appointment Contact information: [address] [place] [address] [phone]            The results of significant diagnostics from this hospitalization (including imaging, microbiology, ancillary and laboratory) are listed below for reference.    Significant Diagnostic Studies: DG Cervical Spine Complete  Result Date: 05/07/2019 CLINICAL DATA:  Neck pain for 1 day, no known injury, initial encounter EXAM: CERVICAL SPINE - COMPLETE 4+ VIEW COMPARISON:  None. FINDINGS: Seven cervical segments are well visualized. Mild osteophytic changes are noted most prominent at C4-5. Mild neural foraminal narrowing is seen bilaterally. The odontoid is within normal limits. No acute fracture  or acute facet abnormality is seen. No soft tissue changes are noted. IMPRESSION: Mild degenerative change without acute abnormality. Electronically Signed   By: Alcide Clever M.D.   On: 05/07/2019 15:45   DG Shoulder Right  Result Date: 05/07/2019 CLINICAL DATA:  Right shoulder pain for 1 day, no known injury, initial encounter EXAM: RIGHT SHOULDER - 2+ VIEW COMPARISON:  None. FINDINGS: There is no evidence of fracture or dislocation. There is no evidence of arthropathy or other focal bone abnormality. Soft tissues are unremarkable. IMPRESSION: No acute abnormality noted. Electronically Signed   By: Alcide Clever M.D.   On: 05/07/2019 15:45   DG Chest Portable 1 View  Result Date: 05/07/2019 CLINICAL DATA:  Chest pain. Right-sided pain radiating down right arm. Shortness of breath. EXAM: PORTABLE CHEST 1 VIEW COMPARISON:  None. FINDINGS: Mild cardiomegaly with normal mediastinal contours. Mild pulmonary edema with Kerley B-lines. Possible small pleural effusions. No confluent airspace disease. No pneumothorax. No acute osseous abnormalities are seen. IMPRESSION: Mild cardiomegaly and pulmonary edema. Possible small pleural effusions. Electronically Signed   By: Narda Rutherford M.D.   On: 05/07/2019 04:50   ECHOCARDIOGRAM COMPLETE  Result Date: 05/07/2019    ECHOCARDIOGRAM REPORT   Patient Name:   Seth Snyder Date of Exam: 05/07/2019 Medical Rec #:  [id number]         Height:  67.0 in Accession #:    [id number]        Weight:       142.0 lb Date of Birth:  1960-04-28         BSA:          1.75 m Patient Age:    59 years          BP:           154/109 mmHg Patient Gender: M                 HR:           74 bpm. Exam Location:  Inpatient Procedure: 2D Echo, Cardiac Doppler and Color Doppler Indications:    I48.91* Unspeicified atrial fibrillation  History:        Patient has no prior history of Echocardiogram examinations.                 Abnormal ECG, Arrythmias:Atrial Fibrillation; Risk                  Factors:Hypertension. Elevated troponin.  Sonographer:    Sheralyn Boatman RDCS Referring Phys: [id number] JAMES KIM IMPRESSIONS  1. Left ventricular ejection fraction, by estimation, is 45 to 50%. The left ventricle has mildly decreased function. The left ventricle demonstrates global hypokinesis. Left ventricular diastolic function could not be evaluated.  2. Right ventricular systolic function is low normal. The right ventricular size is normal. There is mildly elevated pulmonary artery systolic pressure.  3. The mitral valve is grossly normal. Mild mitral valve regurgitation.  4. Tricuspid valve regurgitation is mild to moderate.  5. The aortic valve is tricuspid. Aortic valve regurgitation is mild. No aortic stenosis is present.  6. There is mild dilatation of the ascending aorta measuring 39 mm.  7. The inferior vena cava is dilated in size with >50% respiratory variability, suggesting right atrial pressure of 8 mmHg. Comparison(s): No prior Echocardiogram. FINDINGS  Left Ventricle: Left ventricular ejection fraction, by estimation, is 45 to 50%. The left ventricle has mildly decreased function. The left ventricle demonstrates global hypokinesis. The left ventricular internal cavity size was normal in size. There is  no left ventricular hypertrophy. The left ventricular diastology could not be evaluated due to atrial fibrillation. Left ventricular diastolic function could not be evaluated. Right Ventricle: The right ventricular size is normal. No increase in right ventricular wall thickness. Right ventricular systolic function is low normal. There is mildly elevated pulmonary artery systolic pressure. The tricuspid regurgitant velocity is 2.48 m/s, and with an assumed right atrial pressure of 8 mmHg, the estimated right ventricular systolic pressure is 32.6 mmHg. Left Atrium: Left atrial size was normal in size. Right Atrium: Right atrial size was normal in size. Pericardium: Trivial pericardial effusion is present.  Presence of pericardial fat pad. Mitral Valve: The mitral valve is grossly normal. Mild mitral valve regurgitation. Tricuspid Valve: The tricuspid valve is grossly normal. Tricuspid valve regurgitation is mild to moderate. Aortic Valve: The aortic valve is tricuspid. Aortic valve regurgitation is mild. Aortic regurgitation PHT measures 248 msec. No aortic stenosis is present. Pulmonic Valve: The pulmonic valve was grossly normal. Pulmonic valve regurgitation is not visualized. No evidence of pulmonic stenosis. Aorta: The aortic root is normal in size and structure. There is mild dilatation of the ascending aorta measuring 39 mm. Venous: The inferior vena cava is dilated in size with greater than 50% respiratory variability, suggesting right atrial pressure of 8 mmHg. IAS/Shunts: No atrial level  shunt detected by color flow Doppler.  LEFT VENTRICLE PLAX 2D LVIDd:         4.90 cm LVIDs:         3.80 cm LV PW:         1.30 cm LV IVS:        1.00 cm LVOT diam:     2.50 cm LV SV:         81.98 ml LV SV Index:   29.12 LVOT Area:     4.91 cm  LV Volumes (MOD) LV vol d, MOD A2C: 116.0 ml LV vol d, MOD A4C: 114.0 ml LV vol s, MOD A2C: 72.5 ml LV vol s, MOD A4C: 58.5 ml LV SV MOD A2C:     43.5 ml LV SV MOD A4C:     114.0 ml LV SV MOD BP:      45.1 ml RIGHT VENTRICLE         IVC TAPSE (M-mode): 1.5 cm  IVC diam: 2.10 cm LEFT ATRIUM             Index       RIGHT ATRIUM           Index LA diam:        4.40 cm 2.52 cm/m  RA Area:     19.00 cm LA Vol (A2C):   51.0 ml 29.17 ml/m RA Volume:   55.20 ml  31.57 ml/m LA Vol (A4C):   47.3 ml 27.05 ml/m LA Biplane Vol: 51.2 ml 29.29 ml/m  AORTIC VALVE LVOT Vmax:   79.80 cm/s LVOT Vmean:  55.800 cm/s LVOT VTI:    0.167 m AI PHT:      248 msec  AORTA Ao Root diam: 3.40 cm Ao Asc diam:  3.60 cm MITRAL VALVE                 TRICUSPID VALVE MV Area (PHT): 4.67 cm      TR Peak grad:   24.6 mmHg MV Decel Time: 162 msec      TR Vmax:        248.00 cm/s MR Peak grad:    81.7 mmHg MR Mean  grad:    58.0 mmHg   SHUNTS MR Vmax:         452.00 cm/s Systemic VTI:  0.17 m MR Vmean:        359.5 cm/s  Systemic Diam: 2.50 cm MR PISA:         0.57 cm MR PISA Eff ROA: 4 mm MR PISA Radius:  0.30 cm MV E velocity: 103.00 cm/s Lennie Odor MD Electronically signed by Lennie Odor MD Signature Date/Time: 05/07/2019/5:34:05 PM    Final    ECHO TEE  Result Date: 05/10/2019    TRANSESOPHOGEAL ECHO REPORT   Patient Name:   MATVEY LLANAS [facility] Date of Exam: 05/10/2019 Medical Rec #:  [id number]         Height:       67.0 in Accession #:    [id number]        Weight:       146.0 lb Date of Birth:  1961-01-21         BSA:          1.77 m Patient Age:    59 years          BP:           143/103 mmHg Patient Gender: M  HR:           125 bpm. Exam Location:  Inpatient Procedure: Transesophageal Echo, Cardiac Doppler and Color Doppler                                 MODIFIED REPORT:   This report was modified by Olga Millers MD on 05/10/2019 due to Additional                                   information.  Indications:     atrial fibrillation 427.31  History:         Patient has prior history of Echocardiogram examinations, most                  recent 05/07/2019. Arrythmias:Atrial Fibrillation; Risk                  Factors:Hypertension and Former Smoker.  Sonographer:     Celene Skeen RDCS (AE) Referring Phys:  [id number] Tacey Ruiz DUNN Diagnosing Phys: Olga Millers MD PROCEDURE: The transesophogeal probe was passed without difficulty through the esophogus of the patient. Imaged were obtained with the patient in a left lateral decubitus position. Sedation performed by different physician. Patients was under conscious sedation during this procedure. Image quality was good. The patient developed no complications during the procedure. Pt sedated by anesthesia with diprovan 178 mg IV total. IMPRESSIONS  1. Moderate to severe global reduction in LV systolic function; mild AI and MR; mild LAE; spontaneous contrast in  LAA but no thrombus; mild RAE and RVE; moderate TR.  2. Left ventricular ejection fraction, by estimation, is 30 to 35%. The left ventricle has moderately decreased function. The left ventricle demonstrates global hypokinesis. Left ventricular diastolic function could not be evaluated.  3. Right ventricular systolic function is normal. The right ventricular size is mildly enlarged.  4. Left atrial size was mildly dilated. No left atrial/left atrial appendage thrombus was detected.  5. Right atrial size was mildly dilated.  6. The mitral valve is normal in structure and function. Mild mitral valve regurgitation.  7. Tricuspid valve regurgitation is moderate.  8. The aortic valve is tricuspid. Aortic valve regurgitation is mild.  9. There is mild (Grade II) plaque involving the descending aorta. Conclusion(s)/Recomendation(s): No LA/LAA thrombus identified. Successful cardioversion performed with restoration of normal sinus rhythm. FINDINGS  Left Ventricle: Left ventricular ejection fraction, by estimation, is 30 to 35%. The left ventricle has moderately decreased function. The left ventricle demonstrates global hypokinesis. The left ventricular internal cavity size was normal in size. There is no left ventricular hypertrophy. Left ventricular diastolic function could not be evaluated. Right Ventricle: The right ventricular size is mildly enlarged. Right ventricular systolic function is normal. Left Atrium: Left atrial size was mildly dilated. Spontaneous echo contrast was present in the left atrial appendage. No left atrial/left atrial appendage thrombus was detected. Right Atrium: Right atrial size was mildly dilated. Pericardium: There is no evidence of pericardial effusion. Mitral Valve: The mitral valve is normal in structure and function. Mild mitral valve regurgitation. Tricuspid Valve: The tricuspid valve is normal in structure. Tricuspid valve regurgitation is moderate. Aortic Valve: The aortic valve is  tricuspid. Aortic valve regurgitation is mild. Pulmonic Valve: The pulmonic valve was normal in structure. Pulmonic valve regurgitation is trivial. Aorta: The aortic root is normal in size and structure.  There is mild (Grade II) plaque involving the descending aorta. IAS/Shunts: No atrial level shunt detected by color flow Doppler. Additional Comments: Moderate to severe global reduction in LV systolic function; mild AI and MR; mild LAE; spontaneous contrast in LAA but no thrombus; mild RAE and RVE; moderate TR. Kirk Ruths MD Electronically signed by Kirk Ruths MD Signature Date/Time: 05/10/2019/9:17:29 AM    Final (Updated)     Microbiology: Recent Results (from the past 240 hour(s))  Respiratory Panel by RT PCR (Flu A&B, Covid) - Nasopharyngeal Swab     Status: None   Collection Time: 05/07/19  6:09 AM   Specimen: Nasopharyngeal Swab  Result Value Ref Range Status   SARS Coronavirus 2 by RT PCR NEGATIVE NEGATIVE Final    Comment: (NOTE) SARS-CoV-2 target nucleic acids are NOT DETECTED. The SARS-CoV-2 RNA is generally detectable in upper respiratoy specimens during the acute phase of infection. The lowest concentration of SARS-CoV-2 viral copies this assay can detect is 131 copies/mL. A negative result does not preclude SARS-Cov-2 infection and should not be used as the sole basis for treatment or other patient management decisions. A negative result may occur with  improper specimen collection/handling, submission of specimen other than nasopharyngeal swab, presence of viral mutation(s) within the areas targeted by this assay, and inadequate number of viral copies (<131 copies/mL). A negative result must be combined with clinical observations, patient history, and epidemiological information. The expected result is Negative. Fact Sheet for Patients:  PinkCheek.be Fact Sheet for Healthcare Providers:  GravelBags.it This test  is not yet ap proved or cleared by the [place] FDA and  has been authorized for detection and/or diagnosis of SARS-CoV-2 by FDA under an Emergency Use Authorization (EUA). This EUA will remain  in effect (meaning this test can be used) for the duration of the COVID-19 declaration under Section 564(b)(1) of the Act, 21 U.S.C. section 360bbb-3(b)(1), unless the authorization is terminated or revoked sooner.    Influenza A by PCR NEGATIVE NEGATIVE Final   Influenza B by PCR NEGATIVE NEGATIVE Final    Comment: (NOTE) The Xpert Xpress SARS-CoV-2/FLU/RSV assay is intended as an aid in  the diagnosis of influenza from Nasopharyngeal swab specimens and  should not be used as a sole basis for treatment. Nasal washings and  aspirates are unacceptable for Xpert Xpress SARS-CoV-2/FLU/RSV  testing. Fact Sheet for Patients: PinkCheek.be Fact Sheet for Healthcare Providers: GravelBags.it This test is not yet approved or cleared by the [place] FDA and  has been authorized for detection and/or diagnosis of SARS-CoV-2 by  FDA under an Emergency Use Authorization (EUA). This EUA will remain  in effect (meaning this test can be used) for the duration of the  Covid-19 declaration under Section 564(b)(1) of the Act, 21  U.S.C. section 360bbb-3(b)(1), unless the authorization is  terminated or revoked. Performed at [facility], [place] [address] [place], [place] [address]      Labs: Basic Metabolic Panel: Recent Labs  Lab 05/07/19 0430 05/08/19 0557 05/09/19 0329 05/10/19 0313  NA 139 138 135 136  K 3.9 3.0* 4.0 4.4  CL 108 103 99 106  CO2 18* 24 21* 20*  GLUCOSE 97 104* 98 106*  BUN 14 19 25* 17  CREATININE 1.10 1.43* 1.40* 1.17  CALCIUM 9.1 9.2 9.2 9.2  MG 1.6* 1.9 1.8  --    Liver Function Tests: Recent Labs  Lab 05/07/19 0430 05/08/19 0557  AST 38 33  ALT 36 33  ALKPHOS  90 96  BILITOT 1.2 1.7*  PROT 7.4  7.2  ALBUMIN 4.1 3.8   Recent Labs  Lab 05/07/19 0430  LIPASE 50   No results for input(s): AMMONIA in the last 168 hours. CBC: Recent Labs  Lab 05/07/19 0430 05/08/19 0557 05/10/19 0313  WBC 9.2 6.2 7.0  HGB 13.3 14.0 14.1  HCT 41.1 41.2 43.1  MCV 84.2 80.6 83.2  PLT 167 195 234   Cardiac Enzymes: No results for input(s): CKTOTAL, CKMB, CKMBINDEX, TROPONINI in the last 168 hours. BNP: BNP (last 3 results) Recent Labs    05/07/19 0430  BNP 733.8*    ProBNP (last 3 results) No results for input(s): PROBNP in the last 8760 hours.  CBG: No results for input(s): GLUCAP in the last 168 hours.     Signed:  Zannie Cove MD.  [facility] 05/10/2019, 3:51 PM

## 2019-05-10 NOTE — Anesthesia Postprocedure Evaluation (Signed)
Anesthesia Post Note  Patient: Seth Snyder Conservation officer, historic buildings  Procedure(s) Performed: TRANSESOPHAGEAL ECHOCARDIOGRAM (TEE) (N/A ) CARDIOVERSION (N/A )     Patient location during evaluation: Endoscopy Anesthesia Type: General Level of consciousness: awake and patient cooperative Pain management: pain level controlled Vital Signs Assessment: post-procedure vital signs reviewed and stable Respiratory status: spontaneous breathing, nonlabored ventilation, respiratory function stable and patient connected to nasal cannula oxygen Cardiovascular status: stable Postop Assessment: no apparent nausea or vomiting Anesthetic complications: no    Last Vitals:  Vitals:   05/10/19 0900 05/10/19 0913  BP: (!) 120/94   Pulse: 61   Resp: 13 13  Temp:    SpO2: 99% 98%    Last Pain:  Vitals:   05/10/19 0913  TempSrc:   PainSc: 0-No pain                 Seth Snyder

## 2019-05-10 NOTE — Anesthesia Preprocedure Evaluation (Addendum)
Anesthesia Evaluation  Patient identified by MRN, date of birth, ID band Patient awake    Reviewed: Allergy & Precautions, NPO status , Patient's Chart, lab work & pertinent test results, reviewed documented beta blocker date and time   History of Anesthesia Complications Negative for: history of anesthetic complications  Airway Mallampati: II  TM Distance: >3 FB Neck ROM: Full    Dental  (+) Dental Advisory Given, Poor Dentition, Partial Upper, Loose   Pulmonary neg recent URI, Current Smoker, former smoker,    breath sounds clear to auscultation       Cardiovascular hypertension, Pt. on medications and Pt. on home beta blockers (-) angina(-) Past MI + dysrhythmias Atrial Fibrillation  Rhythm:Irregular  05/07/19:  . Left ventricular ejection fraction, by estimation, is 45 to 50%. The  left ventricle has mildly decreased function. The left ventricle  demonstrates global hypokinesis. Left ventricular diastolic function could  not be evaluated.  2. Right ventricular systolic function is low normal. The right  ventricular size is normal. There is mildly elevated pulmonary artery  systolic pressure.  3. The mitral valve is grossly normal. Mild mitral valve regurgitation.  4. Tricuspid valve regurgitation is mild to moderate.  5. The aortic valve is tricuspid. Aortic valve regurgitation is mild. No  aortic stenosis is present.  6. There is mild dilatation of the ascending aorta measuring 39 mm.  7. The inferior vena cava is dilated in size with >50% respiratory  variability, suggesting right atrial pressure of 8 mmHg.    Neuro/Psych negative neurological ROS  negative psych ROS   GI/Hepatic negative GI ROS, Neg liver ROS,   Endo/Other  Hypothyroidism   Renal/GU negative Renal ROS     Musculoskeletal negative musculoskeletal ROS (+)   Abdominal   Peds  Hematology   Anesthesia Other Findings    Reproductive/Obstetrics                           Anesthesia Physical Anesthesia Plan  ASA: II  Anesthesia Plan: MAC and General   Post-op Pain Management:    Induction: Intravenous  PONV Risk Score and Plan: 1 and Treatment may vary due to age or medical condition and Propofol infusion  Airway Management Planned: Nasal Cannula  Additional Equipment: None  Intra-op Plan:   Post-operative Plan:   Informed Consent: I have reviewed the patients History and Physical, chart, labs and discussed the procedure including the risks, benefits and alternatives for the proposed anesthesia with the patient or authorized representative who has indicated his/her understanding and acceptance.     Dental advisory given  Plan Discussed with: CRNA and Surgeon  Anesthesia Plan Comments:         Anesthesia Quick Evaluation

## 2019-05-10 NOTE — Interval H&P Note (Signed)
History and Physical Interval Note:  05/10/2019 7:40 AM  Seth Snyder  has presented today for surgery, with the diagnosis of atrial fibrillation.  The various methods of treatment have been discussed with the patient and family. After consideration of risks, benefits and other options for treatment, the patient has consented to  Procedure(s): TRANSESOPHAGEAL ECHOCARDIOGRAM (TEE) (N/A) CARDIOVERSION (N/A) as a surgical intervention.  The patient's history has been reviewed, patient examined, no change in status, stable for surgery.  I have reviewed the patient's chart and labs.  Questions were answered to the patient's satisfaction.     Olga Millers

## 2019-05-10 NOTE — TOC Progression Note (Signed)
Transition of Care [facility]) - Progression Note    Patient Details  Name: Seth Snyder MRN: [id number] Date of Birth: Sep 12, 1960  [facility] of [facility]) CM/SW Contact  Eduard Roux, [place] Phone Number: 05/10/2019, 12:37 PM  Clinical Narrative:        CSW  provided the patient with medicaid application as requested.  Antony Blackbird, MSW, LCSWA Clinical Social Worker     Expected Discharge Plan and Services                                                 Social Determinants of Health (SDOH) Interventions    Readmission Risk Interventions No flowsheet data found.

## 2019-05-10 NOTE — Progress Notes (Signed)
Discharge AVS meds take and those due reviewed with pt. Follow up appointments and when to call MD reviewed. All questions and concerns addressed using ipad interpreter.  No further questions at this time. D/c IV and TELE, CCMD notified. D/C home per orders. Pt brought down via wheelchair with all belongings.  Theophilus Kinds, RN

## 2019-05-10 NOTE — Progress Notes (Signed)
Progress Note  Patient Name: Seth Snyder Date of Encounter: 05/10/2019  Primary Cardiologist: Chilton Si, MD   Subjective   Continues to have shoulder pain.  No chest pain or shortness of breath.  Inpatient Medications    Scheduled Meds: . apixaban  5 mg Oral BID  . metoprolol succinate  200 mg Oral Daily  . sodium chloride flush  3 mL Intravenous Once   Continuous Infusions:  PRN Meds: acetaminophen **OR** acetaminophen, gabapentin   Vital Signs    Vitals:   05/10/19 0845 05/10/19 0900 05/10/19 0913 05/10/19 0926  BP: (!) 137/106 (!) 120/94  (!) 119/93  Pulse: 63 61  70  Resp: 13 13 13 14   Temp:      TempSrc:      SpO2: 97% 99% 98% 98%  Weight:      Height:        Intake/Output Summary (Last 24 hours) at 05/10/2019 1050 Last data filed at 05/10/2019 0823 Gross per 24 hour  Intake 440 ml  Output --  Net 440 ml   Last 3 Weights 05/10/2019 05/10/2019 05/09/2019  Weight (lbs) 146 lb 146 lb 147 lb 3.2 oz  Weight (kg) 66.225 kg 66.225 kg 66.769 kg      Telemetry    Atrial fibrillation rate controlled now sinus rhythm.  - Personally Reviewed  ECG    n/a - Personally Reviewed  Physical Exam   VS:  BP (!) 119/93 (BP Location: Right Arm)   Pulse 70   Temp 97.8 F (36.6 C) (Temporal)   Resp 14   Ht 5\' 7"  (1.702 m)   Wt 66.2 kg   SpO2 98%   BMI 22.87 kg/m  , BMI Body mass index is 22.87 kg/m. GENERAL:  Well appearing HEENT: Pupils equal round and reactive, fundi not visualized, oral mucosa unremarkable NECK:  No jugular venous distention, waveform within normal limits, carotid upstroke brisk and symmetric, no bruits LUNGS:  Clear to auscultation bilaterally HEART:  RRR.  PMI not displaced or sustained,S1 and S2 within normal limits, no S3, no S4, no clicks, no rubs, no murmurs ABD:  Flat, positive bowel sounds normal in frequency in pitch, no bruits, no rebound, no guarding, no midline pulsatile mass, no hepatomegaly, no splenomegaly EXT:  2  plus pulses throughout, no edema, no cyanosis no clubbing SKIN:  No rashes no nodules NEURO:  Cranial nerves II through XII grossly intact, motor grossly intact throughout [facility]:  Cognitively intact, oriented to person place and time   Labs    High Sensitivity Troponin:   Recent Labs  Lab 05/07/19 0430 05/07/19 0822  TROPONINIHS 21* 20*      Chemistry Recent Labs  Lab 05/07/19 0430 05/07/19 0430 05/08/19 0557 05/09/19 0329 05/10/19 0313  NA 139   < > 138 135 136  K 3.9   < > 3.0* 4.0 4.4  CL 108   < > 103 99 106  CO2 18*   < > 24 21* 20*  GLUCOSE 97   < > 104* 98 106*  BUN 14   < > 19 25* 17  CREATININE 1.10   < > 1.43* 1.40* 1.17  CALCIUM 9.1   < > 9.2 9.2 9.2  PROT 7.4  --  7.2  --   --   ALBUMIN 4.1  --  3.8  --   --   AST 38  --  33  --   --   ALT 36  --  33  --   --  ALKPHOS 90  --  96  --   --   BILITOT 1.2  --  1.7*  --   --   GFRNONAA >60   < > 54* 55* >60  GFRAA >60   < > >60 >60 >60  ANIONGAP 13   < > 11 15 10    < > = values in this interval not displayed.     Hematology Recent Labs  Lab 05/07/19 0430 05/08/19 0557 05/10/19 0313  WBC 9.2 6.2 7.0  RBC 4.88 5.11 5.18  HGB 13.3 14.0 14.1  HCT 41.1 41.2 43.1  MCV 84.2 80.6 83.2  MCH 27.3 27.4 27.2  MCHC 32.4 34.0 32.7  RDW 15.3 14.9 15.4  PLT 167 195 234    BNP Recent Labs  Lab 05/07/19 0430  BNP 733.8*     DDimer No results for input(s): DDIMER in the last 168 hours.   Radiology    ECHO TEE  Result Date: 05/10/2019    TRANSESOPHOGEAL ECHO REPORT   Patient Name:   Seth Snyder Date of Exam: 05/10/2019 Medical Rec #:  05/12/2019         Height:       67.0 in Accession #:    [id number]        Weight:       146.0 lb Date of Birth:  [birth year]         BSA:          1.77 m Patient Age:    59 years          BP:           143/103 mmHg Patient Gender: M                 HR:           125 bpm. Exam Location:  Inpatient Procedure: Transesophageal Echo, Cardiac Doppler and Color Doppler                                  MODIFIED REPORT:   This report was modified by 05/07/1960 MD on 05/10/2019 due to Additional                                   information.  Indications:     atrial fibrillation 427.31  History:         Patient has prior history of Echocardiogram examinations, most                  recent 05/07/2019. Arrythmias:Atrial Fibrillation; Risk                  Factors:Hypertension and Former Smoker.  Sonographer:     05/09/2019 RDCS (AE) Referring Phys:  Celene Skeen [id number] DUNN Diagnosing Phys: Tacey Ruiz MD PROCEDURE: The transesophogeal probe was passed without difficulty through the esophogus of the patient. Imaged were obtained with the patient in a left lateral decubitus position. Sedation performed by different physician. Patients was under conscious sedation during this procedure. Image quality was good. The patient developed no complications during the procedure. Pt sedated by anesthesia with diprovan 178 mg IV total. IMPRESSIONS  1. Moderate to severe global reduction in LV systolic function; mild AI and MR; mild LAE; spontaneous contrast in LAA but no thrombus; mild RAE and RVE; moderate TR.  2. Left ventricular  ejection fraction, by estimation, is 30 to 35%. The left ventricle has moderately decreased function. The left ventricle demonstrates global hypokinesis. Left ventricular diastolic function could not be evaluated.  3. Right ventricular systolic function is normal. The right ventricular size is mildly enlarged.  4. Left atrial size was mildly dilated. No left atrial/left atrial appendage thrombus was detected.  5. Right atrial size was mildly dilated.  6. The mitral valve is normal in structure and function. Mild mitral valve regurgitation.  7. Tricuspid valve regurgitation is moderate.  8. The aortic valve is tricuspid. Aortic valve regurgitation is mild.  9. There is mild (Grade II) plaque involving the descending aorta. Conclusion(s)/Recomendation(s): No LA/LAA thrombus  identified. Successful cardioversion performed with restoration of normal sinus rhythm. FINDINGS  Left Ventricle: Left ventricular ejection fraction, by estimation, is 30 to 35%. The left ventricle has moderately decreased function. The left ventricle demonstrates global hypokinesis. The left ventricular internal cavity size was normal in size. There is no left ventricular hypertrophy. Left ventricular diastolic function could not be evaluated. Right Ventricle: The right ventricular size is mildly enlarged. Right ventricular systolic function is normal. Left Atrium: Left atrial size was mildly dilated. Spontaneous echo contrast was present in the left atrial appendage. No left atrial/left atrial appendage thrombus was detected. Right Atrium: Right atrial size was mildly dilated. Pericardium: There is no evidence of pericardial effusion. Mitral Valve: The mitral valve is normal in structure and function. Mild mitral valve regurgitation. Tricuspid Valve: The tricuspid valve is normal in structure. Tricuspid valve regurgitation is moderate. Aortic Valve: The aortic valve is tricuspid. Aortic valve regurgitation is mild. Pulmonic Valve: The pulmonic valve was normal in structure. Pulmonic valve regurgitation is trivial. Aorta: The aortic root is normal in size and structure. There is mild (Grade II) plaque involving the descending aorta. IAS/Shunts: No atrial level shunt detected by color flow Doppler. Additional Comments: Moderate to severe global reduction in LV systolic function; mild AI and MR; mild LAE; spontaneous contrast in LAA but no thrombus; mild RAE and RVE; moderate TR. Kirk Ruths MD Electronically signed by Kirk Ruths MD Signature Date/Time: 05/10/2019/9:17:29 AM    Final (Updated)     Cardiac Studies   Echo 05/07/19 IMPRESSIONS   1. Left ventricular ejection fraction, by estimation, is 45 to 50%. The  left ventricle has mildly decreased function. The left ventricle  demonstrates global  hypokinesis. Left ventricular diastolic function could  not be evaluated.  2. Right ventricular systolic function is low normal. The right  ventricular size is normal. There is mildly elevated pulmonary artery  systolic pressure.  3. The mitral valve is grossly normal. Mild mitral valve regurgitation.  4. Tricuspid valve regurgitation is mild to moderate.  5. The aortic valve is tricuspid. Aortic valve regurgitation is mild. No  aortic stenosis is present.  6. There is mild dilatation of the ascending aorta measuring 39 mm.  7. The inferior vena cava is dilated in size with >50% respiratory  variability, suggesting right atrial pressure of 8 mmHg.   TEE 05/10/19:  IMPRESSIONS   1. Moderate to severe global reduction in LV systolic function; mild AI  and MR; mild LAE; spontaneous contrast in LAA but no thrombus; mild RAE  and RVE; moderate TR.  2. Left ventricular ejection fraction, by estimation, is 30 to 35%. The  left ventricle has moderately decreased function. The left ventricle  demonstrates global hypokinesis. Left ventricular diastolic function could  not be evaluated.  3. Right ventricular systolic function is  normal. The right ventricular  size is mildly enlarged.  4. Left atrial size was mildly dilated. No left atrial/left atrial  appendage thrombus was detected.  5. Right atrial size was mildly dilated.  6. The mitral valve is normal in structure and function. Mild mitral  valve regurgitation.  7. Tricuspid valve regurgitation is moderate.  8. The aortic valve is tricuspid. Aortic valve regurgitation is mild.  9. There is mild (Grade II) plaque involving the descending aorta.   Patient Profile     59 y.o. male with paroxysmal atrial fibrillation, chronic systolic and diastolic heart failure, tobacco abuse and hypertension here with shortness of breath and R neck/shoulder pain.    Assessment & Plan    # Atrial fibrillation with RVR: Heart rate was  controlled after increasing metoprolol to 150mg .  He underwent successful TEE/DCCV on 2/18.  There is concern that he won't hold sinus rhythm.  However, given his reduced LVEF, young age, and lack of insurance, his options for antiarrhythmics are limited.  Also thyroid function was mildly abnormal making amiodarone less desirable.  Continue with rate control for now.  # Hypertension: Metoprolol as above.  # Chronic systolic and diastolic heart failure: LVEF 45 to 50% on echo.  EF was 30-35% on TEE.  Likely rate related.  Blood pressure is stable on metoprolol.  Home hydrochlorothiazide has been discontinued.  Plan for outpatient ischemia evaluation.  # Demand ischemia: High-sensitivity troponin was mildly elevated and flat at 21.  Aspirin was stopped as he is on Eliquis.    # Atherosclerosos of the aorta: Resume atorvastatin.  LDL goal <70.  Continue Eliquis as above.   # Neck/shoulder pain: Mild arthritic changes noted in the neck.  Symptoms seem to have improved significantly with Toradol.  He seems to be describing radicular pain.  Agree with adding gabapentin.  [facility] will sign off.   Medication Recommendations:  Adding atorvastatin Other recommendations (labs, testing, etc):  Lipids/cmp in 3 months.  Echo in 3 months.  Add ARB as able.  Consider antiarrhythmics and stress as outpatient.  Follow up as an outpatient:  arranged   For questions or updates, please contact [facility] Please consult [email or web address] for contact info under        Signed, 3/18, MD  05/10/2019, 10:50 AM

## 2019-05-10 NOTE — Telephone Encounter (Signed)
New Message   Patient has a TOC appt on 2/24 with Jesse Cleaver  

## 2019-05-15 NOTE — Progress Notes (Signed)
Cardiology Clinic Note   Patient Name: Seth Snyder Date of Encounter: 05/16/2019  Primary Care Provider:  Patient, No Pcp Per Primary Cardiologist:  Chilton Si, MD  Patient Profile    Seth Snyder 59 year old male presents today for follow-up of his atrial fibrillation, HTN, and combined systolic and diastolic heart failure.  Past Medical History    Past Medical History:  Diagnosis Date  . Anemia   . Atrial fibrillation (HCC)   . Hypertension   . Low back pain    Past Surgical History:  Procedure Laterality Date  . CARDIOVERSION N/A 05/10/2019   Procedure: CARDIOVERSION;  Surgeon: Lewayne Bunting, MD;  Location: [facility] ENDOSCOPY;  Service: Cardiovascular;  Laterality: N/A;  . COLONOSCOPY  07/12/2013  . TEE WITHOUT CARDIOVERSION N/A 05/10/2019   Procedure: TRANSESOPHAGEAL ECHOCARDIOGRAM (TEE);  Surgeon: Lewayne Bunting, MD;  Location: [facility] ENDOSCOPY;  Service: Cardiovascular;  Laterality: N/A;    Allergies  No Known Allergies  History of Present Illness  Seth Snyder has a PMH of paroxysmal atrial fibrillation/flutter that was diagnosed in November/December 2020 in [place].  He presented to [facility] and was admitted 02/12/2019-02/17/2019 for hypertensive urgency and A. fib/flutter.  He was started on Eliquis 5 mg twice daily, aspirin 81 mg and metoprolol ER 100 mg.  He followed up with his PCP 03/12/2019 and was stable.  At that time he indicated that he was moving from [place] to [facility].  His PMH also includes hypothyroidism, lower extremity venous insufficiency, HTN, anemia, and tobacco abuse.  He was admitted to the hospital 05/07/2019-05/10/2019 with A. fib RVR.  His echocardiogram 05/07/2019 showed an LVEF of 45-50%, diastolic dysfunction could not be evaluated, mildly elevated pulmonary arterial pressures, mild mitral regurgitation, tricuspid regurgitation mild-moderate, and mild aortic regurgitation.  He underwent TEE and DCCV 05/10/2019 which was  successful.  (120 J to normal sinus rhythm, atrial fibrillation recurred and repeat DCCV with 150 J resulted in sinus rhythm ). He was started on metoprolol 150 mg and due to his LV function, age, and lack of insurance options for antiarrhythmics were limited.  His thyroid function was also noted to be mildly abnormal making amiodarone an undesirable option.  Apixaban 5mg  twice daily was continued.  He presents to the clinic today for follow-up and states he is having right shoulder pain.  This has been ongoing for several months.  He does not recall any fall, work-related injury or trauma.  His pain is reproducible in the office today with light and deep palpation.  When asked about his breathing and his heart rate he states he feels well.  He has not noticed any irregular heartbeats or increased heart rate since his cardioversion.  He has been taking his Eliquis.  Today he denies  shortness of breath, lower extremity edema, fatigue, palpitations, melena, hematuria, hemoptysis, diaphoresis, presyncope, syncope, orthopnea, and PND.   Home Medications    Prior to Admission medications   Medication Sig Start Date End Date Taking? Authorizing Provider  acetaminophen (TYLENOL) 500 MG tablet Take 500 mg by mouth daily as needed for mild pain.    [provider]  apixaban (ELIQUIS) 5 MG TABS tablet Take 5 mg by mouth 2 (two) times daily.    [provider]  atorvastatin (LIPITOR) 40 MG tablet Take 1 tablet (40 mg total) by mouth daily at 6 PM. 05/10/19   05/12/19, MD  cyclobenzaprine (FLEXERIL) 5 MG tablet Take 1 tablet (5 mg total) by mouth 2 (two) times  daily as needed for muscle spasms (pain). 05/10/19   Zannie Cove, MD  metoprolol succinate (TOPROL-XL) 100 MG 24 hr tablet Take 2 tablets (200 mg total) by mouth daily. Take with or immediately following a meal. 05/10/19   Zannie Cove, MD    Family History    Family History  Problem Relation Age of Onset  . Diabetes  Mother   . Diabetes Sister   . Mental illness Sister   . Mental illness Brother    He indicated that his mother is deceased. He indicated that his father is alive. He indicated that all of his four sisters are alive. He indicated that his brother is alive.  Social History    Social History   Socioeconomic History  . Marital status: Single    Spouse name: Not on file  . Number of children: Not on file  . Years of education: Not on file  . Highest education level: Not on file  Occupational History  . Not on file  Tobacco Use  . Smoking status: Former Smoker    Quit date: 12/21/2018    Years since quitting: 0.4  . Smokeless tobacco: Never Used  Substance and Sexual Activity  . Alcohol use: Not Currently  . Drug use: Not on file  . Sexual activity: Not on file  Other Topics Concern  . Not on file  Social History Narrative  . Not on file   Social Determinants of Health   Financial Resource Strain:   . Difficulty of Paying Living Expenses: Not on file  Food Insecurity:   . Worried About Programme researcher, broadcasting/film/video in the Last Year: Not on file  . Ran Out of Food in the Last Year: Not on file  Transportation Needs:   . Lack of Transportation (Medical): Not on file  . Lack of Transportation (Non-Medical): Not on file  Physical Activity:   . Days of Exercise per Week: Not on file  . Minutes of Exercise per Session: Not on file  Stress:   . Feeling of Stress : Not on file  Social Connections:   . Frequency of Communication with Friends and Family: Not on file  . Frequency of Social Gatherings with Friends and Family: Not on file  . Attends Religious Services: Not on file  . Active Member of Clubs or Organizations: Not on file  . Attends Banker Meetings: Not on file  . Marital Status: Not on file  Intimate Partner Violence:   . Fear of Current or Ex-Partner: Not on file  . Emotionally Abused: Not on file  . Physically Abused: Not on file  . Sexually Abused: Not on  file     Review of Systems    General:  No chills, fever, night sweats or weight changes.  Cardiovascular:  No chest pain, dyspnea on exertion, edema, orthopnea, palpitations, paroxysmal nocturnal dyspnea. Dermatological: No rash, lesions/masses Respiratory: No cough, dyspnea Urologic: No hematuria, dysuria Abdominal:   No nausea, vomiting, diarrhea, bright red blood per rectum, melena, or hematemesis Neurologic:  No visual changes, wkns, changes in mental status. All other systems reviewed and are otherwise negative except as noted above.  Physical Exam    VS:  BP 104/82   Pulse 92   Temp 98.5 F (36.9 C)   Ht 5\' 9"  (1.753 m)   Wt 154 lb (69.9 kg)   SpO2 97%   BMI 22.74 kg/m  , BMI Body mass index is 22.74 kg/m. GEN: Well nourished, well developed,  in no acute distress. HEENT: normal. Neck: Supple, no JVD, carotid bruits, or masses. Cardiac: RRR, no murmurs, rubs, or gallops. No clubbing, cyanosis, edema.  Radials/DP/PT 2+ and equal bilaterally.  Respiratory:  Respirations regular and unlabored, clear to auscultation bilaterally. GI: Soft, nontender, nondistended, BS + x 4. MS: no deformity or atrophy.  Right shoulder pain when limited flexion extension. Skin: warm and dry, no rash. Neuro:  Strength and sensation are intact. Psych: Normal affect.  Accessory Clinical Findings    ECG personally reviewed by me today-atrial fibrillation moderate voltage criteria for LVH 90 bpm  EKG 05/10/2019 Normal sinus rhythm 60 bpm  Echocardiogram 05/07/2019 IMPRESSIONS    1. Left ventricular ejection fraction, by estimation, is 45 to 50%. The  left ventricle has mildly decreased function. The left ventricle  demonstrates global hypokinesis. Left ventricular diastolic function could  not be evaluated.  2. Right ventricular systolic function is low normal. The right  ventricular size is normal. There is mildly elevated pulmonary artery  systolic pressure.  3. The mitral valve  is grossly normal. Mild mitral valve regurgitation.  4. Tricuspid valve regurgitation is mild to moderate.  5. The aortic valve is tricuspid. Aortic valve regurgitation is mild. No  aortic stenosis is present.  6. There is mild dilatation of the ascending aorta measuring 39 mm.  7. The inferior vena cava is dilated in size with >50% respiratory  variability, suggesting right atrial pressure of 8 mmHg.   TEE 05/10/2019 IMPRESSIONS    1. Moderate to severe global reduction in LV systolic function; mild AI  and MR; mild LAE; spontaneous contrast in LAA but no thrombus; mild RAE  and RVE; moderate TR.  2. Left ventricular ejection fraction, by estimation, is 30 to 35%. The  left ventricle has moderately decreased function. The left ventricle  demonstrates global hypokinesis. Left ventricular diastolic function could  not be evaluated.  3. Right ventricular systolic function is normal. The right ventricular  size is mildly enlarged.  4. Left atrial size was mildly dilated. No left atrial/left atrial  appendage thrombus was detected.  5. Right atrial size was mildly dilated.  6. The mitral valve is normal in structure and function. Mild mitral  valve regurgitation.  7. Tricuspid valve regurgitation is moderate.  8. The aortic valve is tricuspid. Aortic valve regurgitation is mild.  9. There is mild (Grade II) plaque involving the descending aorta.   Conclusion(s)/Recomendation(s): No LA/LAA thrombus identified. Successful  cardioversion performed with restoration of normal sinus rhythm.    Assessment & Plan   1.  Atrial fibrillation with RVR-EKG today shows atrial fibrillation 90 bpm.  TEE with DCCV x2 on 05/10/2019.  I previously been seen in [place] and underwent TEE and cardioversion on 01/2019.  Then relocated to [facility] and ran out of his medications.  He is cardiac unaware and feels his breathing is back to his baseline. Continue metoprolol succinate 200 mg  daily Continue apixaban 5 mg tablet twice daily Avoid triggers caffeine, chocolate, EtOH etc. Order nuclear stress test to evaluate for ischemia.  Essential hypertension-BP today 104/82 Continue metoprolol succinate 200 mg daily Heart healthy low-sodium diet Increase physical activity as tolerated  Chronic systolic and diastolic CHF-appears euvolemic today.  His LVEF was 45-50% on echocardiogram.  His EF was 30-35% on his TEE.  This was felt to be related to his HR.  Anticipated outpatient ischemic evaluation. Continue metoprolol 200 mg daily Continue heart healthy low-sodium diet Daily weights  Aortic atherosclerosis-goal LDL less  than 70. 05/07/2019: Cholesterol 163; HDL 58; LDL Cholesterol 88; Triglycerides 84; VLDL 17 Continue atorvastatin 40 mg daily Heart healthy low-sodium high-fiber diet Increase physical activity as tolerated  Right shoulder pain-painful flexion, extension, internal and external rotation.  No trauma, falls, work-related injuries.  Pain has been present for 3 more months.  Primary referred to orthopedics.   Disposition: Follow-up with Dr. Duke Salvia after stress test.   [name]. Lovinia Snare NP-C     [facility] [address] Office [phone] Fax [phone]

## 2019-05-15 NOTE — Telephone Encounter (Signed)
Patient contacted regarding discharge from [facility] on 05/10/19.  Patient understands to follow up with providerCleaver  on 05/16/19 at 2:30 pm at [facility]. Patient understands discharge instructions? yes  Patient understands medications and regiment? yes  Patient understands to bring all medications to this visit? yes

## 2019-05-16 ENCOUNTER — Other Ambulatory Visit: Payer: Self-pay

## 2019-05-16 ENCOUNTER — Encounter: Payer: Self-pay | Admitting: General Practice

## 2019-05-16 ENCOUNTER — Ambulatory Visit (INDEPENDENT_AMBULATORY_CARE_PROVIDER_SITE_OTHER): Payer: Self-pay | Admitting: General Practice

## 2019-05-16 ENCOUNTER — Ambulatory Visit (INDEPENDENT_AMBULATORY_CARE_PROVIDER_SITE_OTHER): Payer: Self-pay | Admitting: Nurse Practitioner

## 2019-05-16 ENCOUNTER — Telehealth: Payer: Self-pay | Admitting: Nurse Practitioner

## 2019-05-16 ENCOUNTER — Encounter: Payer: Self-pay | Admitting: Nurse Practitioner

## 2019-05-16 VITALS — BP 104/82 | HR 92 | Temp 98.5°F | Ht 69.0 in | Wt 154.0 lb

## 2019-05-16 VITALS — BP 127/96 | HR 88 | Temp 98.8°F | Resp 14 | Ht 68.0 in | Wt 153.0 lb

## 2019-05-16 DIAGNOSIS — M25511 Pain in right shoulder: Secondary | ICD-10-CM

## 2019-05-16 DIAGNOSIS — R4589 Other symptoms and signs involving emotional state: Secondary | ICD-10-CM

## 2019-05-16 DIAGNOSIS — I10 Essential (primary) hypertension: Secondary | ICD-10-CM

## 2019-05-16 DIAGNOSIS — I358 Other nonrheumatic aortic valve disorders: Secondary | ICD-10-CM

## 2019-05-16 DIAGNOSIS — I5042 Chronic combined systolic (congestive) and diastolic (congestive) heart failure: Secondary | ICD-10-CM

## 2019-05-16 DIAGNOSIS — I4891 Unspecified atrial fibrillation: Secondary | ICD-10-CM

## 2019-05-16 MED ORDER — CYCLOBENZAPRINE HCL 5 MG PO TABS: 5.0000 mg | ORAL_TABLET | Freq: Three times a day (TID) | ORAL | 0 refills | Status: DC | PRN

## 2019-05-16 NOTE — Progress Notes (Signed)
New Patient Office Visit  Subjective:  Patient ID: Seth Snyder, male    DOB: 1960/10/25  Age: 59 y.o. MRN: [id number]  CC:  Chief Complaint  Patient presents with  . Establish Care    hospital follow up   . Shoulder Pain    right shoulder pain and right arm pain   . Depression    due to pain per interpreter     HPI Seth Snyder presents to establish care. He  has a past medical history of Anemia, Atrial fibrillation (HCC), Hypertension, and Low back pain.  He was recently admitted into the hospital for atrial fibrillation.  He did undergo a transesophageal echocardiogram and cardioversion.  He is a previous resident of [place].  He moved here last year but has not established primary care provider.  He continues to have chest pain.  His main concern is his right shoulder and arm pain.  He admits that on Saturday he suffered a fall at home.  He said the room was spinning he developed sweats and then he fell.  He did lie in the floor until he started feeling better.  He does live with his daughter but no one heard him.  He admits that he does have a history of several falls in the past.  Approximately late last year is when the right shoulder pain began.  He describes it as sharp and achy.  The pain radiates down his arm into his hand affecting the fourth finger.  He does have some numbness and weakness.  He rates it a 9 out of 10.  He admits that the pain is so severe that it makes him want to commit harm to his arm.  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  He has been taking the Tylenol which is not effective.  He has also been using the cyclobenzaprine. 05/07/2019 cervical spine x-ray mildly abnormal, right shoulder x-ray negative. Denies headache, dizziness, visual changes, shortness of breath, dyspnea on exertion, nausea, vomiting or any edema.   When asked the patient about the comment of self-harm.   He feels like once he gets the pain under control this would not be an issue.    Past Medical History:  Diagnosis Date  . Anemia   . Atrial fibrillation (HCC)   . Hypertension   . Low back pain     Past Surgical History:  Procedure Laterality Date  . CARDIOVERSION N/A 05/10/2019   Procedure: CARDIOVERSION;  Surgeon: Lewayne Bunting, MD;  Location: [facility] ENDOSCOPY;  Service: Cardiovascular;  Laterality: N/A;  . COLONOSCOPY  07/12/2013  . TEE WITHOUT CARDIOVERSION N/A 05/10/2019   Procedure: TRANSESOPHAGEAL ECHOCARDIOGRAM (TEE);  Surgeon: Lewayne Bunting, MD;  Location: [facility] ENDOSCOPY;  Service: Cardiovascular;  Laterality: N/A;    Family History  Problem Relation Age of Onset  . Diabetes Mother   . Diabetes Sister   . Mental illness Sister   . Mental illness Brother     Social History   Socioeconomic History  . Marital status: Single    Spouse name: Not on file  . Number of children: Not on file  . Years of education: Not on file  . Highest education level: Not on file  Occupational History  . Not on file  Tobacco Use  . Smoking status: Former Smoker    Quit date: 12/21/2018    Years since quitting: 0.4  . Smokeless tobacco: Never  Used  Substance and Sexual Activity  . Alcohol use: Not Currently  . Drug use: Not on file  . Sexual activity: Not on file  Other Topics Concern  . Not on file  Social History Narrative  . Not on file   Social Determinants of Health   Financial Resource Strain:   . Difficulty of Paying Living Expenses: Not on file  Food Insecurity:   . Worried About Programme researcher, broadcasting/film/video in the Last Year: Not on file  . Ran Out of Food in the Last Year: Not on file  Transportation Needs:   . Lack of Transportation (Medical): Not on file  . Lack of Transportation (Non-Medical): Not on file  Physical Activity:   . Days of Exercise per Week: Not on file  . Minutes of Exercise per Session: Not on file  Stress:   . Feeling of Stress : Not on file    Social Connections:   . Frequency of Communication with Friends and Family: Not on file  . Frequency of Social Gatherings with Friends and Family: Not on file  . Attends Religious Services: Not on file  . Active Member of Clubs or Organizations: Not on file  . Attends Banker Meetings: Not on file  . Marital Status: Not on file  Intimate Partner Violence:   . Fear of Current or Ex-Partner: Not on file  . Emotionally Abused: Not on file  . Physically Abused: Not on file  . Sexually Abused: Not on file    ROS Review of Systems  Constitutional: Negative.   HENT: Negative.   Eyes: Negative.   Respiratory: Negative.   Cardiovascular: Positive for chest pain.  Gastrointestinal: Negative.   Endocrine: Negative.   Genitourinary: Negative.   Musculoskeletal: Positive for arthralgias and joint swelling.  Allergic/Immunologic: Negative.   Neurological: Positive for dizziness, light-headedness and numbness.  Hematological: Negative.   Psychiatric/Behavioral: Positive for suicidal ideas.       Due the pain    Objective:   Today's Vitals: BP (!) 127/96 (BP Location: Left Arm, Patient Position: Sitting, Cuff Size: Normal)   Pulse 88   Temp 98.8 F (37.1 C) (Oral)   Resp 14   Ht 5\' 8"  (1.727 m)   Wt 153 lb (69.4 kg)   SpO2 100%   BMI 23.26 kg/m   Physical Exam Constitutional:      Appearance: Normal appearance.     Comments: In Mild distress related to shoulder pain  HENT:     Head: Normocephalic.     Nose: Nose normal.     Mouth/Throat:     Mouth: Mucous membranes are moist.     Pharynx: Oropharynx is clear.  Cardiovascular:     Rate and Rhythm: Normal rate and regular rhythm.     Pulses: Normal pulses.  Pulmonary:     Effort: Pulmonary effort is normal.     Comments: Diminished breath sounds in bilateral bases Musculoskeletal:     Right shoulder: Tenderness present.       Arms:     Cervical back: Normal range of motion.  Neurological:     Mental  Status: He is alert and oriented to person, place, and time.     Assessment & Plan:   Problem List Items Addressed This Visit      Unprioritized   Shoulder pain - Primary   Relevant Orders   AMB referral to orthopedics    Other Visit Diagnoses    Depressed mood  Patient declined any treatment Would like to get the shoulder pain under control      Outpatient Encounter Medications as of 05/16/2019  Medication Sig  . acetaminophen (TYLENOL) 500 MG tablet Take 500 mg by mouth daily as needed for mild pain.  Marland Kitchen apixaban (ELIQUIS) 5 MG TABS tablet Take 5 mg by mouth 2 (two) times daily.  Marland Kitchen atorvastatin (LIPITOR) 40 MG tablet Take 1 tablet (40 mg total) by mouth daily at 6 PM.  . metoprolol succinate (TOPROL-XL) 100 MG 24 hr tablet Take 2 tablets (200 mg total) by mouth daily. Take with or immediately following a meal.  . [DISCONTINUED] cyclobenzaprine (FLEXERIL) 5 MG tablet Take 1 tablet (5 mg total) by mouth 2 (two) times daily as needed for muscle spasms (pain).  . [DISCONTINUED] cyclobenzaprine (FLEXERIL) 5 MG tablet Take 1 tablet (5 mg total) by mouth 3 (three) times daily as needed for muscle spasms (pain).   No facility-administered encounter medications on file as of 05/16/2019.    Follow-up: Return in about 4 weeks (around 06/13/2019).   Vevelyn Francois, NP

## 2019-05-16 NOTE — Telephone Encounter (Signed)
Pharmacy has been updated in chart. Thanks!

## 2019-05-16 NOTE — Patient Instructions (Signed)
Shoulder Pain Many things can cause shoulder pain, including:  An injury.  Moving the shoulder in the same way again and again (overuse).  Joint pain (arthritis). Pain can come from:  Swelling and irritation (inflammation) of any part of the shoulder.  An injury to the shoulder joint.  An injury to: ? Tissues that connect muscle to bone (tendons). ? Tissues that connect bones to each other (ligaments). ? Bones. Follow these instructions at home: Watch for changes in your symptoms. Let your doctor know about them. Follow these instructions to help with your pain. If you have a sling:  Wear the sling as told by your doctor. Remove it only as told by your doctor.  Loosen the sling if your fingers: ? Tingle. ? Become numb. ? Turn cold and blue.  Keep the sling clean.  If the sling is not waterproof: ? Do not let it get wet. ? Take the sling off when you shower or bathe. Managing pain, stiffness, and swelling   If told, put ice on the painful area: ? Put ice in a plastic bag. ? Place a towel between your skin and the bag. ? Leave the ice on for 20 minutes, 2-3 times a day. Stop putting ice on if it does not help with the pain.  Squeeze a soft ball or a foam pad as much as possible. This prevents swelling in the shoulder. It also helps to strengthen the arm. General instructions  Take over-the-counter and prescription medicines only as told by your doctor.  Keep all follow-up visits as told by your doctor. This is important. Contact a doctor if:  Your pain gets worse.  Medicine does not help your pain.  You have new pain in your arm, hand, or fingers. Get help right away if:  Your arm, hand, or fingers: ? Tingle. ? Are numb. ? Are swollen. ? Are painful. ? Turn white or blue. Summary  Shoulder pain can be caused by many things. These include injury, moving the shoulder in the same away again and again, and joint pain.  Watch for changes in your symptoms.  Let your doctor know about them.  This condition may be treated with a sling, ice, and pain medicine.  Contact your doctor if the pain gets worse or you have new pain. Get help right away if your arm, hand, or fingers tingle or get numb, swollen, or painful.  Keep all follow-up visits as told by your doctor. This is important. This information is not intended to replace advice given to you by your health care provider. Make sure you discuss any questions you have with your health care provider. Document Revised: 09/20/2017 Document Reviewed: 09/20/2017 Elsevier Patient Education  2020 Elsevier Inc.  

## 2019-05-16 NOTE — Patient Instructions (Signed)
Medication Instructions:  The current medical regimen is effective;  continue present plan and medications as directed. Please refer to the Current Medication list given to you today. If you need a refill on your cardiac medications before your next appointment, please call your pharmacy.  Testing: Your physician has requested that you have a lexiscan myoview. A cardiac stress test is a cardiological test that measures the heart's ability to respond to external stress in a controlled clinical environment. The stress response is induced by intravenous pharmacological stimulation.  Special Instructions: Please read attached AFIB   Follow-Up: 3 months In Person Chilton Si, MD.    At [facility], you and your health needs are our priority.  As part of our continuing mission to provide you with exceptional heart care, we have created designated Provider Care Teams.  These Care Teams include your primary Cardiologist (physician) and Advanced Practice Providers (APPs -  Physician Assistants and Nurse Practitioners) who all work together to provide you with the care you need, when you need it.  Reduce your risk of getting COVID-19 With your heart disease it is especially important for people at increased risk of severe illness from COVID-19, and those who live with them, to protect themselves from getting COVID-19. The best way to protect yourself and to help reduce the spread of the virus that causes COVID-19 is to: Marland Kitchen Limit your interactions with other people as much as possible. . Take COVID-19 when you do interact with others. If you start feeling sick and think you may have COVID-19, get in touch with your healthcare provider within 24 hours.  Thank you for choosing [facility] at [facility]!!    Atrial Fibrillation  Atrial fibrillation is a type of heartbeat that is irregular or fast. If you have this condition, your heart beats without any order. This makes it hard for your heart  to pump blood in a normal way. Atrial fibrillation may come and go, or it may become a long-lasting problem. If this condition is not treated, it can put you at higher risk for stroke, heart failure, and other heart problems. What are the causes? This condition may be caused by diseases that damage the heart. They include:  High blood pressure.  Heart failure.  Heart valve disease.  Heart surgery. Other causes include:  Diabetes.  Thyroid disease.  Being overweight.  Kidney disease. Sometimes the cause is not known. What increases the risk? You are more likely to develop this condition if:  You are older.  You smoke.  You exercise often and very hard.  You have a family history of this condition.  You are a man.  You use drugs.  You drink a lot of alcohol.  You have lung conditions, such as emphysema, pneumonia, or COPD.  You have sleep apnea. What are the signs or symptoms? Common symptoms of this condition include:  A feeling that your heart is beating very fast.  Chest pain or discomfort.  Feeling short of breath.  Suddenly feeling light-headed or weak.  Getting tired easily during activity.  Fainting.  Sweating. In some cases, there are no symptoms. How is this treated? Treatment for this condition depends on underlying conditions and how you feel when you have atrial fibrillation. They include:  Medicines to: ? Prevent blood clots. ? Treat heart rate or heart rhythm problems.  Using devices, such as a pacemaker, to correct heart rhythm problems.  Doing surgery to remove the part of the heart that sends bad  signals.  Closing an area where clots can form in the heart (left atrial appendage). In some cases, your doctor will treat other underlying conditions. Follow these instructions at home: Medicines  Take over-the-counter and prescription medicines only as told by your doctor.  Do not take any new medicines without first talking to  your doctor.  If you are taking blood thinners: ? Talk with your doctor before you take any medicines that have aspirin or NSAIDs, such as ibuprofen, in them. ? Take your medicine exactly as told by your doctor. Take it at the same time each day. ? Avoid activities that could hurt or bruise you. Follow instructions about how to prevent falls. ? Wear a bracelet that says you are taking blood thinners. Or, carry a card that lists what medicines you take. Lifestyle      Do not use any products that have nicotine or tobacco in them. These include cigarettes, e-cigarettes, and chewing tobacco. If you need help quitting, ask your doctor.  Eat heart-healthy foods. Talk with your doctor about the right eating plan for you.  Exercise regularly as told by your doctor.  Do not drink alcohol.  Lose weight if you are overweight.  Do not use drugs, including cannabis. General instructions  If you have a condition that causes breathing to stop for a short period of time (apnea), treat it as told by your doctor.  Keep a healthy weight. Do not use diet pills unless your doctor says they are safe for you. Diet pills may make heart problems worse.  Keep all follow-up visits as told by your doctor. This is important. Contact a doctor if:  You notice a change in the speed, rhythm, or strength of your heartbeat.  You are taking a blood-thinning medicine and you get more bruising.  You get tired more easily when you move or exercise.  You have a sudden change in weight. Get help right away if:   You have pain in your chest or your belly (abdomen).  You have trouble breathing.  You have side effects of blood thinners, such as blood in your vomit, poop (stool), or pee (urine), or bleeding that cannot stop.  You have any signs of a stroke. "BE FAST" is an easy way to remember the main warning signs: ? B - Balance. Signs are dizziness, sudden trouble walking, or loss of balance. ? E - Eyes.  Signs are trouble seeing or a change in how you see. ? F - Face. Signs are sudden weakness or loss of feeling in the face, or the face or eyelid drooping on one side. ? A - Arms. Signs are weakness or loss of feeling in an arm. This happens suddenly and usually on one side of the body. ? S - Speech. Signs are sudden trouble speaking, slurred speech, or trouble understanding what people say. ? T - Time. Time to call emergency services. Write down what time symptoms started.  You have other signs of a stroke, such as: ? A sudden, very bad headache with no known cause. ? Feeling like you may vomit (nausea). ? Vomiting. ? A seizure. These symptoms may be an emergency. Do not wait to see if the symptoms will go away. Get medical help right away. Call your local emergency services (911 in the [place] Do not drive yourself to the hospital. Summary  Atrial fibrillation is a type of heartbeat that is irregular or fast.  You are at higher risk of this condition if  you smoke, are older, have diabetes, or are overweight.  Follow your doctor's instructions about medicines, diet, exercise, and follow-up visits.  Get help right away if you have signs or symptoms of a stroke.  Get help right away if you cannot catch your breath, or you have chest pain or discomfort. This information is not intended to replace advice given to you by your health care provider. Make sure you discuss any questions you have with your health care provider.

## 2019-05-18 ENCOUNTER — Telehealth (HOSPITAL_COMMUNITY): Payer: Self-pay

## 2019-05-18 NOTE — Telephone Encounter (Signed)
Encounter complete. 

## 2019-05-21 ENCOUNTER — Other Ambulatory Visit: Payer: Self-pay

## 2019-05-21 ENCOUNTER — Ambulatory Visit (INDEPENDENT_AMBULATORY_CARE_PROVIDER_SITE_OTHER): Payer: Medicaid Other | Admitting: Orthopaedic Surgery

## 2019-05-21 ENCOUNTER — Encounter: Payer: Self-pay | Admitting: Orthopaedic Surgery

## 2019-05-21 DIAGNOSIS — M4807 Spinal stenosis, lumbosacral region: Secondary | ICD-10-CM

## 2019-05-21 DIAGNOSIS — M542 Cervicalgia: Secondary | ICD-10-CM | POA: Diagnosis not present

## 2019-05-21 DIAGNOSIS — M792 Neuralgia and neuritis, unspecified: Secondary | ICD-10-CM | POA: Diagnosis not present

## 2019-05-21 MED ORDER — GABAPENTIN 300 MG PO CAPS: 300.0000 mg | ORAL_CAPSULE | Freq: Every day | ORAL | 1 refills | Status: DC

## 2019-05-21 MED ORDER — METHYLPREDNISOLONE 4 MG PO TABS: ORAL_TABLET | ORAL | 0 refills | Status: DC

## 2019-05-21 NOTE — Progress Notes (Signed)
Office Visit Note   Patient: Seth Snyder           Date of Birth: 1960-07-07           MRN: [id number] Visit Date: 05/21/2019              Requested by: Barbette Merino, NP [address] [place],  [place] [address] PCP: Patient, No Pcp Per   Assessment & Plan: Visit Diagnoses:  1. Cervicalgia   2. Radicular pain in right arm     Plan: Given his significant radicular symptoms of the right upper extremity, a MRI is warranted to rule out a herniated disc or nerve compression.  We will try a 6-day steroid taper.  I recommended he continue his Flexeril.  Also will start Neurontin 300 mg just at bedtime.  Hopefully we will get him back in 2 weeks and he will have had the MRI of his right shoulder.  He does speak broken [place] but states that he understands these instructions.  Follow-Up Instructions: Return in about 2 weeks (around 06/04/2019).   Orders:  No orders of the defined types were placed in this encounter.  Meds ordered this encounter  Medications  . methylPREDNISolone (MEDROL) 4 MG tablet    Sig: Medrol dose pack. Take as instructed    Dispense:  21 tablet    Refill:  0  . gabapentin (NEURONTIN) 300 MG capsule    Sig: Take 1 capsule (300 mg total) by mouth at bedtime.    Dispense:  30 capsule    Refill:  1      Procedures: No procedures performed   Clinical Data: No additional findings.   Subjective: Chief Complaint  Patient presents with  . Right Shoulder - Pain  The patient is a 59 year old who was referred from cardiology to evaluate right shoulder and neck pain.  He actually just had an intervention recently which from the notes sounds like was a cardiac ablation to treat atrial fibrillation.  I see from the medications that he is on that he does take Eliquis 2 as well as metoprolol.  They have him on Flexeril as well.  He reports pain from the cervical spine that radiates into his right shoulder but all the way down to his right hand with numbness  and tingling in the hand.  This has been going on for about 2 months now.  He denies any deep right shoulder pain.  He reports his pain to be in the trapezius area and then around the axillary area of the shoulder but again radiating down the arm.  He is not a diabetic.  He does have a history of heart failure as well.  He was recently hospitalized with A. fib with RVR.  Again he had his procedure just 2 weeks ago.  HPI  Review of Systems Today he denies any chest pain or shortness of breath.  He denies any fever, chills, nausea, vomiting  Objective: Vital Signs: There were no vitals taken for this visit.  Physical Exam He is alert and orient x3 and in no acute distress Ortho Exam Examination of his right shoulder shows that it moves fluidly and smoothly both with active and passive motion.  His pain seems to be in the parascapular area in the trapezius area.  He also has weak triceps function on this right side which is his dominant side.  He has a weak grip and pinch strength as well.  He has numbness  all the way into his ring finger. Specialty Comments:  No specialty comments available.  Imaging: No results found. I independently reviewed x-rays of his cervical spine and his right shoulder.  The right shoulder films are normal.  The cervical spine films do show degenerative changes at multiple levels.  PMFS History: Patient Active Problem List   Diagnosis Date Noted  . Shoulder pain   . Chronic combined systolic and diastolic heart failure ([place])   . Atrial fibrillation with RVR ([place]) 05/07/2019  . Hypertension 05/07/2019  . Hypothyroidism 05/07/2019  . Hypomagnesemia 05/07/2019  . Elevated troponin 05/07/2019  . Acute pulmonary edema (H[facility])    Past Medical History:  Diagnosis Date  . Anemia   . Atrial fibrillation ([place])   . Hypertension   . Low back pain     Family History  Problem Relation Age of Onset  . Diabetes Mother   . Diabetes Sister   . Mental illness Sister    . Mental illness Brother     Past Surgical History:  Procedure Laterality Date  . CARDIOVERSION N/A 05/10/2019   Procedure: CARDIOVERSION;  Surgeon: Lelon Perla, MD;  Location: [facility] ENDOSCOPY;  Service: Cardiovascular;  Laterality: N/A;  . COLONOSCOPY  07/12/2013  . TEE WITHOUT CARDIOVERSION N/A 05/10/2019   Procedure: TRANSESOPHAGEAL ECHOCARDIOGRAM (TEE);  Surgeon: Lelon Perla, MD;  Location: [facility] ENDOSCOPY;  Service: Cardiovascular;  Laterality: N/A;   Social History   Occupational History  . Not on file  Tobacco Use  . Smoking status: Former Smoker    Quit date: 12/21/2018    Years since quitting: 0.4  . Smokeless tobacco: Never Used  Substance and Sexual Activity  . Alcohol use: Not Currently  . Drug use: Not on file  . Sexual activity: Not on file

## 2019-05-22 ENCOUNTER — Telehealth (HOSPITAL_COMMUNITY): Payer: Self-pay

## 2019-05-22 NOTE — Telephone Encounter (Signed)
Unable to use interpreter with patient. Pt has limited English. Pt was able to verbalize appointment and even tell me the address of office without me telling him. Pt was able to tell me his appointment time. Pt was given instructions and was able to RBV instructions.  Encounter complete.

## 2019-05-23 ENCOUNTER — Other Ambulatory Visit: Payer: Self-pay

## 2019-05-23 ENCOUNTER — Ambulatory Visit (HOSPITAL_COMMUNITY)
Admission: RE | Admit: 2019-05-23 | Discharge: 2019-05-23 | Disposition: A | Discharge status: Home / Self Care | Payer: Medicaid Other | Source: Ambulatory Visit | Attending: Internal Medicine | Admitting: Internal Medicine

## 2019-05-23 DIAGNOSIS — I4891 Unspecified atrial fibrillation: Secondary | ICD-10-CM | POA: Diagnosis present

## 2019-05-23 DIAGNOSIS — I5042 Chronic combined systolic (congestive) and diastolic (congestive) heart failure: Secondary | ICD-10-CM | POA: Diagnosis present

## 2019-05-23 LAB — MYOCARDIAL PERFUSION IMAGING
Peak HR: 118 {beats}/min
Rest HR: 85 {beats}/min
SDS: 2
SRS: 0
SSS: 2
TID: 1.21

## 2019-05-23 MED ORDER — TECHNETIUM TC 99M TETROFOSMIN IV KIT
10.9000 | PACK | Freq: Once | INTRAVENOUS | Status: AC | PRN
  Administered 2019-05-23: 10.9 via INTRAVENOUS
  Filled 2019-05-23: qty 11

## 2019-05-23 MED ORDER — REGADENOSON 0.4 MG/5ML IV SOLN
0.4000 mg | Freq: Once | INTRAVENOUS | Status: AC
  Administered 2019-05-23: 0.4 mg via INTRAVENOUS

## 2019-05-23 MED ORDER — TECHNETIUM TC 99M TETROFOSMIN IV KIT
28.4000 | PACK | Freq: Once | INTRAVENOUS | Status: AC | PRN
  Administered 2019-05-23: 28.4 via INTRAVENOUS
  Filled 2019-05-23: qty 29

## 2019-05-25 ENCOUNTER — Telehealth: Payer: Self-pay

## 2019-05-25 ENCOUNTER — Telehealth: Payer: Self-pay | Admitting: General Practice

## 2019-05-25 NOTE — Telephone Encounter (Signed)
Patient called to review his stress test results.  None was available to take the call.  Left voicemail to call back.

## 2019-05-25 NOTE — Telephone Encounter (Signed)
Spoke with pt he states that he understands but needs interpreter. I tried to call back with interpreter and he did not answer. I will have to call back on Monday to have CATH procedure Friday 3-12 with either Dr Forest Gleason or Dr END. And have COVID Monday or Tuesday.

## 2019-05-28 NOTE — Telephone Encounter (Signed)
Tried to call pt and discuss CATH procedure-left detailed message(via interpreter) to call back to discuss further procedure.

## 2019-05-28 NOTE — Telephone Encounter (Signed)
Follow Up:   Please call, concerning his Stress Test resultsc

## 2019-05-29 NOTE — Telephone Encounter (Signed)
Left detailed message via [employer] to CB to schedule CATH procedure

## 2019-05-31 ENCOUNTER — Other Ambulatory Visit: Payer: Self-pay | Admitting: Orthopaedic Surgery

## 2019-05-31 DIAGNOSIS — Z77018 Contact with and (suspected) exposure to other hazardous metals: Secondary | ICD-10-CM

## 2019-05-31 NOTE — Telephone Encounter (Signed)
-----   Message from Alyson Ingles, LPN sent at 0/26/3785 4:35 PM EST -----  Just wanted to let you know that we have tried multiple time to call this pt (4times)via interpreter and he has not called back to schedule. He has no DPR on fil to call a family member.

## 2019-05-31 NOTE — Telephone Encounter (Signed)
OK thank you 

## 2019-06-04 ENCOUNTER — Ambulatory Visit: Payer: Self-pay | Admitting: Orthopaedic Surgery

## 2019-06-04 ENCOUNTER — Other Ambulatory Visit: Payer: Self-pay

## 2019-06-08 ENCOUNTER — Other Ambulatory Visit: Payer: Self-pay | Admitting: General Practice

## 2019-06-08 NOTE — Telephone Encounter (Signed)
*  STAT* If patient is at the pharmacy, call can be transferred to refill team.   1. Which medications need to be refilled? (please list name of each medication and dose if known)  Metoprolol and Atorvastatin  2. Which pharmacy/location (including street and city if local pharmacy) is medication to be sent to? [facility]  [address], [place]  3. Do they need a 30 day or 90 day supply? 90 days and refills

## 2019-06-10 MED ORDER — ATORVASTATIN CALCIUM 40 MG PO TABS: 40.0000 mg | ORAL_TABLET | Freq: Every day | ORAL | 3 refills | Status: DC

## 2019-06-10 MED ORDER — METOPROLOL SUCCINATE ER 100 MG PO TB24: 200.0000 mg | ORAL_TABLET | Freq: Every day | ORAL | 7 refills | Status: DC

## 2019-06-11 ENCOUNTER — Other Ambulatory Visit: Payer: Self-pay | Admitting: Nurse Practitioner

## 2019-06-13 ENCOUNTER — Ambulatory Visit (INDEPENDENT_AMBULATORY_CARE_PROVIDER_SITE_OTHER): Payer: Medicaid Other | Admitting: Nurse Practitioner

## 2019-06-13 ENCOUNTER — Other Ambulatory Visit: Payer: Self-pay

## 2019-06-13 ENCOUNTER — Encounter: Payer: Self-pay | Admitting: Nurse Practitioner

## 2019-06-13 VITALS — BP 117/76 | HR 78 | Temp 98.1°F | Resp 14 | Ht 69.0 in | Wt 157.0 lb

## 2019-06-13 DIAGNOSIS — J029 Acute pharyngitis, unspecified: Secondary | ICD-10-CM | POA: Diagnosis not present

## 2019-06-13 DIAGNOSIS — R499 Unspecified voice and resonance disorder: Secondary | ICD-10-CM

## 2019-06-13 DIAGNOSIS — I4891 Unspecified atrial fibrillation: Secondary | ICD-10-CM | POA: Diagnosis not present

## 2019-06-13 DIAGNOSIS — M25511 Pain in right shoulder: Secondary | ICD-10-CM | POA: Diagnosis not present

## 2019-06-13 DIAGNOSIS — R682 Dry mouth, unspecified: Secondary | ICD-10-CM | POA: Diagnosis not present

## 2019-06-13 MED ORDER — MAGIC MOUTHWASH W/LIDOCAINE: 5.0000 mL | Freq: Four times a day (QID) | ORAL | Status: DC

## 2019-06-13 MED ORDER — APIXABAN 5 MG PO TABS: 5.0000 mg | ORAL_TABLET | Freq: Two times a day (BID) | ORAL | 2 refills | Status: DC

## 2019-06-13 MED ORDER — GABAPENTIN 300 MG PO CAPS: 300.0000 mg | ORAL_CAPSULE | Freq: Three times a day (TID) | ORAL | 2 refills | Status: DC

## 2019-06-13 MED FILL — GABAPENTIN 300 MG CAPSULE: 300 | 30 days supply | Qty: 90 | Fill #0

## 2019-06-13 MED FILL — !ELIQUIS 5MG TABLET: 5 | 30 days supply | Qty: 60 | Fill #0

## 2019-06-13 NOTE — Progress Notes (Signed)
Established Patient Office Visit  Subjective:  Patient ID: Seth Snyder, male    DOB: September 04, 1960  Age: 59 y.o. MRN: [id number]  CC:  Chief Complaint  Patient presents with  . Shoulder Pain    right   . Arm Pain    right arm to hand   . Medication Refill    eliquis     HPI Seth Snyder presents for follow up. He  has a past medical history of Anemia, Atrial fibrillation (HCC), Hypertension, and Low back pain.   Shoulder Pain Patient complains of right shoulder pain. The symptoms began several months ago. Aggravating factors: Post cardioversion. Pain is located between the neck and shoulder. Discomfort is described as sharp/stabbing. Symptoms are exacerbated by lying on the shoulder.  The pain goes down his arm.  He denies numbness or tingling.  He does have some weakness.  Evaluation to date: plain films: normal cervical x-ray mild degenerative changes no acute abnormality. Therapy to date includes: rest, ice, avoidance of offending activity, OTC analgesics which are not very effective and home exercises which are not very effective. He was unable to get the MRI secondary to no insurance.  He does have some pending paperwork.. He admits that the gabapentin 300 mg is not effective. He is only taking a qhs.  He admits that he has more pain at night.  He also has upper quadrant chest discomfort that sometimes goes into the neck and shoulder.  He admits that this pain started after his cardioversion.  He does have occasional shortness of breath. He denies headache, dizziness, visual changes, dyspnea on exertion, nausea, vomiting or any edema.  He is concerned about paralysis of his right arm.  He admits that he does have bilateral knee pain.  Hoarseness Patient presents with hoarseness.  The patient describes moderate episodes of hoarseness which are gradually worsening over time.  The episodes began at as an adult.  Associated symptoms were positive for sore throat and dry mouth.  Symptoms of gastroesophageal reflux is not noted.   Past Medical History:  Diagnosis Date  . Anemia   . Atrial fibrillation (HCC)   . Hypertension   . Low back pain     Past Surgical History:  Procedure Laterality Date  . CARDIOVERSION N/A 05/10/2019   Procedure: CARDIOVERSION;  Surgeon: Lewayne Bunting, MD;  Location: [facility] ENDOSCOPY;  Service: Cardiovascular;  Laterality: N/A;  . COLONOSCOPY  07/12/2013  . TEE WITHOUT CARDIOVERSION N/A 05/10/2019   Procedure: TRANSESOPHAGEAL ECHOCARDIOGRAM (TEE);  Surgeon: Lewayne Bunting, MD;  Location: [facility] ENDOSCOPY;  Service: Cardiovascular;  Laterality: N/A;    Family History  Problem Relation Age of Onset  . Diabetes Mother   . Diabetes Sister   . Mental illness Sister   . Mental illness Brother     Social History   Socioeconomic History  . Marital status: Single    Spouse name: Not on file  . Number of children: Not on file  . Years of education: Not on file  . Highest education level: Not on file  Occupational History  . Not on file  Tobacco Use  . Smoking status: Former Smoker    Quit date: 12/21/2018    Years since quitting: 0.4  . Smokeless tobacco: Never Used  Substance and Sexual Activity  . Alcohol use: Not Currently  . Drug use: Not on file  . Sexual activity: Not on file  Other Topics Concern  . Not on file  Social History  Narrative  . Not on file   Social Determinants of Health   Financial Resource Strain:   . Difficulty of Paying Living Expenses:   Food Insecurity:   . Worried About Programme researcher, broadcasting/film/video in the Last Year:   . Barista in the Last Year:   Transportation Needs:   . Freight forwarder (Medical):   Marland Kitchen Lack of Transportation (Non-Medical):   Physical Activity:   . Days of Exercise per Week:   . Minutes of Exercise per Session:   Stress:   . Feeling of Stress :   Social Connections:   . Frequency of Communication with Friends and Family:   . Frequency of Social Gatherings with  Friends and Family:   . Attends Religious Services:   . Active Member of Clubs or Organizations:   . Attends Banker Meetings:   Marland Kitchen Marital Status:   Intimate Partner Violence:   . Fear of Current or Ex-Partner:   . Emotionally Abused:   Marland Kitchen Physically Abused:   . Sexually Abused:     Outpatient Medications Prior to Visit  Medication Sig Dispense Refill  . atorvastatin (LIPITOR) 40 MG tablet Take 1 tablet (40 mg total) by mouth daily at 6 PM. 90 tablet 3  . cyclobenzaprine (FLEXERIL) 5 MG tablet TAKE 1 TABLET (5 MG TOTAL) BY MOUTH 3 (THREE) TIMES DAILY AS NEEDED FOR MUSCLE SPASMS (PAIN). 60 tablet 0  . metoprolol succinate (TOPROL-XL) 100 MG 24 hr tablet Take 2 tablets (200 mg total) by mouth daily. Take with or immediately following a meal. 90 tablet 7  . apixaban (ELIQUIS) 5 MG TABS tablet Take 5 mg by mouth 2 (two) times daily.    Marland Kitchen gabapentin (NEURONTIN) 300 MG capsule Take 1 capsule (300 mg total) by mouth at bedtime. 30 capsule 1  . methylPREDNISolone (MEDROL) 4 MG tablet Medrol dose pack. Take as instructed 21 tablet 0  . acetaminophen (TYLENOL) 500 MG tablet Take 500 mg by mouth daily as needed for mild pain.     No facility-administered medications prior to visit.    No Known Allergies  ROS Review of Systems  HENT: Positive for sore throat.   Cardiovascular:       Chest discomfort upper right   Hematological: Negative.       Objective:    Physical Exam  Constitutional: He is oriented to person, place, and time. He appears well-developed.  HENT:  Head: Normocephalic and atraumatic.  Nose: Nose normal.  Mouth/Throat: Oropharynx is clear and moist.  Neck: No tracheal deviation present. No thyromegaly present.  Cardiovascular: Normal rate, regular rhythm, normal heart sounds and intact distal pulses.  Pulmonary/Chest: Effort normal and breath sounds normal.  Abdominal: Soft. Bowel sounds are normal.  Musculoskeletal:     Right shoulder: Tenderness and  pain present.     Cervical back: Normal range of motion and neck supple.     Thoracic back: Deformity present.       Back:  Lymphadenopathy:    He has no cervical adenopathy.  Neurological: He is alert and oriented to person, place, and time.  Skin: Skin is warm and dry.  Psychiatric: He has a normal mood and affect. His behavior is normal. Judgment and thought content normal.    BP 117/76 (BP Location: Left Arm, Patient Position: Sitting, Cuff Size: Normal)   Pulse 78   Temp 98.1 F (36.7 C) (Oral)   Resp 14   Ht 5\' 9"  (1.753 m)  Wt 157 lb (71.2 kg)   SpO2 99%   BMI 23.18 kg/m  Wt Readings from Last 3 Encounters:  06/13/19 157 lb (71.2 kg)  05/23/19 154 lb (69.9 kg)  05/16/19 154 lb (69.9 kg)     Health Maintenance Due  Topic Date Due  . Hepatitis C Screening  Never done  . COLONOSCOPY  Never done    There are no preventive care reminders to display for this patient.  Lab Results  Component Value Date   TSH 12.771 (H) 05/07/2019   Lab Results  Component Value Date   WBC 7.0 05/10/2019   HGB 14.1 05/10/2019   HCT 43.1 05/10/2019   MCV 83.2 05/10/2019   PLT 234 05/10/2019   Lab Results  Component Value Date   NA 136 05/10/2019   K 4.4 05/10/2019   CO2 20 (L) 05/10/2019   GLUCOSE 106 (H) 05/10/2019   BUN 17 05/10/2019   CREATININE 1.17 05/10/2019   BILITOT 1.7 (H) 05/08/2019   ALKPHOS 96 05/08/2019   AST 33 05/08/2019   ALT 33 05/08/2019   PROT 7.2 05/08/2019   ALBUMIN 3.8 05/08/2019   CALCIUM 9.2 05/10/2019   ANIONGAP 10 05/10/2019   Lab Results  Component Value Date   CHOL 163 05/07/2019   Lab Results  Component Value Date   HDL 58 05/07/2019   Lab Results  Component Value Date   LDLCALC 88 05/07/2019   Lab Results  Component Value Date   TRIG 84 05/07/2019   Lab Results  Component Value Date   CHOLHDL 2.8 05/07/2019   No results found for: HGBA1C    Assessment & Plan:   Problem List Items Addressed This Visit       Unprioritized   Atrial fibrillation with RVR (HCC)   Relevant Medications   apixaban (ELIQUIS) 5 MG TABS tablet   Shoulder pain - Primary    Other Visit Diagnoses    Dry mouth, unspecified       Relevant Medications   magic mouthwash w/lidocaine   Sore throat       Hoarseness or changing voice          Meds ordered this encounter  Medications  . apixaban (ELIQUIS) 5 MG TABS tablet    Sig: Take 1 tablet (5 mg total) by mouth 2 (two) times daily.    Dispense:  60 tablet    Refill:  2    Order Specific Question:   Supervising Provider    Answer:   Tresa Garter [id number]  . gabapentin (NEURONTIN) 300 MG capsule    Sig: Take 1 capsule (300 mg total) by mouth 3 (three) times daily.    Dispense:  90 capsule    Refill:  2    Order Specific Question:   Supervising Provider    Answer:   Tresa Garter [id number]  . magic mouthwash w/lidocaine    Follow-up: Return in about 3 months (around 09/13/2019) for medical record release.    Vevelyn Francois, NP

## 2019-06-13 NOTE — Patient Instructions (Signed)
Cervical Radiculopathy  Cervical radiculopathy means that a nerve in the neck (a cervical nerve) is pinched or bruised. This can happen because of an injury to the cervical spine (vertebrae) in the neck, or as a normal part of getting older. This can cause pain or loss of feeling (numbness) that runs from your neck all the way down to your arm and fingers. Often, this condition gets better with rest. Treatment may be needed if the condition does not get better. What are the causes?  A neck injury.  A bulging disk in your spine.  Muscle movements that you cannot control (muscle spasms).  Tight muscles in your neck due to overuse.  Arthritis.  Breakdown in the bones and joints of the spine (spondylosis) due to getting older.  Bone spurs that form near the nerves in the neck. What are the signs or symptoms?  Pain. The pain may: ? Run from the neck to the arm and hand. ? Be very bad or irritating. ? Be worse when you move your neck.  Loss of feeling or tingling in your arm or hand.  Weakness in your arm or hand, in very bad cases. How is this treated? In many cases, treatment is not needed for this condition. With rest, the condition often gets better over time. If treatment is needed, options may include:  Wearing a soft neck collar (cervical collar) for short periods of time, as told by your doctor.  Doing exercises (physical therapy) to strengthen your neck muscles.  Taking medicines.  Having shots (injections) in your spine, in very bad cases.  Having surgery. This may be needed if other treatments do not help. The type of surgery that is used depends on the cause of your condition. Follow these instructions at home: If you have a soft neck collar:  Wear it as told by your doctor. Remove it only as told by your doctor.  Ask your doctor if you can remove the collar for cleaning and bathing. If you are allowed to remove the collar for cleaning or bathing: ? Follow  instructions from your doctor about how to remove the collar safely. ? Clean the collar by wiping it with mild soap and water and drying it completely. ? Take out any removable pads in the collar every 1-2 days. Wash them by hand with soap and water. Let them air-dry completely before you put them back in the collar. ? Check your skin under the collar for redness or sores. If you see any, tell your doctor. Managing pain      Take over-the-counter and prescription medicines only as told by your doctor.  If told, put ice on the painful area. ? If you have a soft neck collar, remove it as told by your doctor. ? Put ice in a plastic bag. ? Place a towel between your skin and the bag. ? Leave the ice on for 20 minutes, 2-3 times a day.  If using ice does not help, you can try using heat. Use the heat source that your doctor recommends, such as a moist heat pack or a heating pad. ? Place a towel between your skin and the heat source. ? Leave the heat on for 20-30 minutes. ? Remove the heat if your skin turns bright red. This is very important if you are unable to feel pain, heat, or cold. You may have a greater risk of getting burned.  You may try a gentle neck and shoulder rub (massage). Activity  Rest as needed.  Return to your normal activities as told by your doctor. Ask your doctor what activities are safe for you.  Do exercises as told by your doctor or physical therapist.  Do not lift anything that is heavier than 10 lb (4.5 kg) until your doctor tells you that it is safe. General instructions  Use a flat pillow when you sleep.  Do not drive while wearing a soft neck collar. If you do not have a soft neck collar, ask your doctor if it is safe to drive while your neck heals.  Ask your doctor if the medicine prescribed to you requires you to avoid driving or using heavy machinery.  Do not use any products that contain nicotine or tobacco, such as cigarettes, e-cigarettes, and  chewing tobacco. These can delay healing. If you need help quitting, ask your doctor.  Keep all follow-up visits as told by your doctor. This is important. Contact a doctor if:  Your condition does not get better with treatment. Get help right away if:  Your pain gets worse and is not helped with medicine.  You lose feeling or feel weak in your hand, arm, face, or leg.  You have a high fever.  You have a stiff neck.  You cannot control when you poop or pee (have incontinence).  You have trouble with walking, balance, or talking. Summary  Cervical radiculopathy means that a nerve in the neck is pinched or bruised.  A nerve can get pinched from a bulging disk, arthritis, an injury to the neck, or other causes.  Symptoms include pain, tingling, or loss of feeling that goes from the neck into the arm or hand.  Weakness in your arm or hand can happen in very bad cases.  Treatment may include resting, wearing a soft neck collar, and doing exercises. You might need to take medicines for pain. In very bad cases, shots or surgery may be needed. This information is not intended to replace advice given to you by your health care provider. Make sure you discuss any questions you have with your health care provider. Document Revised: 01/27/2018 Document Reviewed: 01/27/2018 Elsevier Patient Education  2020 [employer] Hoarseness  Hoarseness, also called dysphonia, is any abnormal change in your voice that can make it difficult to speak. Your voice may sound raspy, breathy, or strained. Hoarseness is caused by a problem with your vocal cords (vocal folds). These are two bands of tissue inside your voice box (larynx). When you speak, your vocal cords move back and forth to create sound. The surfaces of your vocal cords need to be smooth for your voice to sound clear. Swelling or lumps on your vocal cords can cause hoarseness. Common causes of vocal cord problems include:  Infection in the  nose, throat, and upper air passages (upper respiratory infection).  A long-term cough.  Straining or overusing your voice.  Smoking, or exposure to secondhand smoke.  Allergies.  Medication side effects.  Vocal cord growths.  Vocal cord injuries.  Stomach acids that move up in your throat and irritate your vocal cords (gastroesophageal reflux).  Diseases that affect the nervous system, such as a stroke or Parkinson's disease. Follow these instructions at home: Watch your condition for any changes. To ease discomfort and protect your vocal cords:  Rest your voice.  Do not whisper. Whispering can cause muscle strain.  Do not speak in a loud or harsh voice.  Avoid coughing or clearing your throat.  Do not use  any products that contain nicotine or tobacco, such as cigarettes and e-cigarettes. If you need help quitting, ask your health care provider.  Avoid secondhand smoke.  Do not eat foods that give you heartburn, such as spicy or acidic foods like hot peppers and orange juice. Heartburn can make gastroesophageal reflux worse.  Do not drink beverages that contain caffeine (coffee, tea, or soft drinks) or alcohol (beer, wine, or liquor).  Drink enough fluid to keep your urine pale yellow.  Use a humidifier if the air in your home is dry. If recommended by your health care provider, schedule an appointment with a speech-language specialist. This specialist may give you methods to try that can help you avoid misusing your voice. Contact a health care provider if:  You have hoarseness that lasts longer than 3 weeks.  You almost lose or completely lose your voice for more than 3 days.  You have pain when you swallow or try to talk.  You feel a lump in your neck. Get help right away if:  You have trouble swallowing.  You feel like you are choking when you swallow.  You cough up blood or vomit blood.  You have trouble breathing.  You choke, cannot swallow, or  cannot breathe if you lie flat.  You notice swelling or a rash on your body, face, or tongue. Summary  Hoarseness, also called dysphonia, is any abnormal change in your voice that can make it difficult to speak. Your voice may sound raspy, breathy, or strained.  Hoarseness is caused by a problem with your vocal cords (vocal folds).  Do not speak in a loud or harsh voice, use nicotine or tobacco products, or eat foods that give you heartburn.  If recommended by your health care provider, meet with a speech-language specialist. This information is not intended to replace advice given to you by your health care provider. Make sure you discuss any questions you have with your health care provider. Document Revised: 02/18/2017 Document Reviewed: 12/03/2016 Elsevier Patient Education  [place].

## 2019-06-18 ENCOUNTER — Ambulatory Visit: Payer: Self-pay | Attending: Internal Medicine

## 2019-06-18 DIAGNOSIS — Z23 Encounter for immunization: Secondary | ICD-10-CM

## 2019-06-18 NOTE — Progress Notes (Signed)
   Covid-19 Vaccination Clinic  Name:  Kasper Mudrick    MRN: [id number] DOB: March 15, 1961  06/18/2019  Mr. Ettinger was observed post Covid-19 immunization for 15 minutes without incident. He was provided with Vaccine Information Sheet and instruction to access the V-Safe system.   Mr. Kuechle was instructed to call 911 with any severe reactions post vaccine: Marland Kitchen Difficulty breathing  . Swelling of face and throat  . A fast heartbeat  . A bad rash all over body  . Dizziness and weakness   Immunizations Administered    Name Date Dose VIS Date Route   Pfizer COVID-19 Vaccine 06/18/2019  9:21 AM 0.3 mL 03/02/2019 Intramuscular   Manufacturer: [employer], [employer]   Lot: [id number]   NDC: [address]-2

## 2019-06-29 ENCOUNTER — Other Ambulatory Visit: Payer: Self-pay

## 2019-07-04 ENCOUNTER — Ambulatory Visit: Payer: Self-pay

## 2019-07-10 ENCOUNTER — Ambulatory Visit: Payer: Self-pay | Attending: Internal Medicine

## 2019-07-10 ENCOUNTER — Ambulatory Visit: Payer: Self-pay

## 2019-07-10 DIAGNOSIS — Z23 Encounter for immunization: Secondary | ICD-10-CM

## 2019-07-10 NOTE — Progress Notes (Signed)
   Covid-19 Vaccination Clinic  Name:  Seth Snyder    MRN: [id number] DOB: 1960/09/04  07/10/2019  Mr. Seth Snyder was observed post Covid-19 immunization for 15 minutes without incident. He was provided with Vaccine Information Sheet and instruction to access the V-Safe system.   Mr. Seth Snyder was instructed to call 911 with any severe reactions post vaccine: Marland Kitchen Difficulty breathing  . Swelling of face and throat  . A fast heartbeat  . A bad rash all over body  . Dizziness and weakness   Immunizations Administered    Name Date Dose VIS Date Route   Pfizer COVID-19 Vaccine 07/10/2019  1:40 PM 0.3 mL 05/16/2018 Intramuscular   Manufacturer: [employer], [employer]   Lot: [id number]   NDC: [address]-5

## 2019-07-11 ENCOUNTER — Ambulatory Visit: Payer: Self-pay

## 2019-07-18 ENCOUNTER — Other Ambulatory Visit: Payer: Self-pay

## 2019-07-18 ENCOUNTER — Ambulatory Visit: Payer: Self-pay | Attending: Family Medicine

## 2019-07-19 ENCOUNTER — Ambulatory Visit
Admission: RE | Admit: 2019-07-19 | Discharge: 2019-07-19 | Disposition: A | Discharge status: Home / Self Care | Payer: Self-pay | Source: Ambulatory Visit | Attending: Orthopaedic Surgery | Admitting: Orthopaedic Surgery

## 2019-07-19 DIAGNOSIS — Z77018 Contact with and (suspected) exposure to other hazardous metals: Secondary | ICD-10-CM

## 2019-07-19 DIAGNOSIS — M4807 Spinal stenosis, lumbosacral region: Secondary | ICD-10-CM

## 2019-07-30 ENCOUNTER — Other Ambulatory Visit: Payer: Self-pay | Admitting: Orthopaedic Surgery

## 2019-07-30 NOTE — Telephone Encounter (Signed)
Please advise 

## 2019-07-31 ENCOUNTER — Ambulatory Visit (INDEPENDENT_AMBULATORY_CARE_PROVIDER_SITE_OTHER): Payer: Medicaid Other | Admitting: Orthopaedic Surgery

## 2019-07-31 ENCOUNTER — Other Ambulatory Visit: Payer: Self-pay

## 2019-07-31 ENCOUNTER — Encounter: Payer: Self-pay | Admitting: Orthopaedic Surgery

## 2019-07-31 DIAGNOSIS — M542 Cervicalgia: Secondary | ICD-10-CM | POA: Diagnosis not present

## 2019-07-31 DIAGNOSIS — M792 Neuralgia and neuritis, unspecified: Secondary | ICD-10-CM | POA: Diagnosis not present

## 2019-07-31 MED ORDER — CYCLOBENZAPRINE HCL 10 MG PO TABS: 10.0000 mg | ORAL_TABLET | Freq: Two times a day (BID) | ORAL | 0 refills | Status: DC | PRN

## 2019-07-31 NOTE — Progress Notes (Signed)
The patient is seen today in follow-up with an interpreter.  He is non-English-speaking.  This is to review a MRI of the cervical spine.  When he was sent to me he has significant radicular symptoms all around the parascapular area of the right side and radiating down into his right hand with numbness and tingling in the fourth and fifth digits.  He also has continued week triceps function.  This has not changed.  On exam there is still parascapular pain with subjective numbness in the C6 and C7 distribution.  This is on the right side.  He has weak triceps function as well.  He is right-hand dominant.  I did go over the MRI with him and showed him the images through the interpreter.  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  This does seem to correlate with his clinical exam findings as well.  At this point I would like to send him to Dr. Ophelia Charter or Dr. Otelia Sergeant to further evaluate the cervical protrusion to determine whether or not the patient is a surgical candidate.  We will work on setting up that appointment.  I will refill his hydrocodone.  All questions and concerns were answered and addressed.

## 2019-08-08 ENCOUNTER — Encounter: Payer: Self-pay | Admitting: Orthopaedic Surgery

## 2019-08-08 ENCOUNTER — Ambulatory Visit (INDEPENDENT_AMBULATORY_CARE_PROVIDER_SITE_OTHER): Payer: Medicaid Other | Admitting: Orthopaedic Surgery

## 2019-08-08 ENCOUNTER — Other Ambulatory Visit: Payer: Self-pay

## 2019-08-08 DIAGNOSIS — M4802 Spinal stenosis, cervical region: Secondary | ICD-10-CM | POA: Diagnosis not present

## 2019-08-08 DIAGNOSIS — M5020 Other cervical disc displacement, unspecified cervical region: Secondary | ICD-10-CM

## 2019-08-08 MED ORDER — CYCLOBENZAPRINE HCL 10 MG PO TABS: 10.0000 mg | ORAL_TABLET | Freq: Two times a day (BID) | ORAL | 0 refills | Status: DC | PRN

## 2019-08-08 NOTE — Progress Notes (Signed)
Office Visit Note   Patient: Seth Snyder           Date of Birth: [birth year]           MRN: [id number] Visit Date: 08/08/2019              Requested by: Barbette Merino, NP [address] [place],  [place] [address] PCP: Barbette Merino, NP   Assessment & Plan: Visit Diagnoses:  1. Spinal stenosis of cervical region   2. Protrusion of cervical intervertebral disc     Plan: Patient has compression at C6-7 broad-based on the right side consistent with a radiculopathy.  She does have moderate narrowing at C5-6 but also has trace retrolisthesis at C4-5.Marland Kitchen  She will need cardiac clearance with her past history.  Patient has been on Eliquis and a decision will have to be made about bridging versus no bridge if she elects for proceeding with surgical intervention.  Cervical plan would be two-level cervical fusion at C5-6, C6-7 with allograft and plate.  Overnight stay in the hospital.  Soft collar postoperatively.  With her history of atrial fibrillation on placed on the monitor floor overnight.  MRI scan was reviewed with patient as well as radiology report.  Questions were elicited and answered.  We discussed post operative collar possible pseudoarthrosis possible reoperation.  Dural tear.  Potential for recurrent cardiac problems with surgery.  Questions were elicited and answered.  She will need cardiac clearance.  Follow-Up Instructions: No follow-ups on file.   Orders:  No orders of the defined types were placed in this encounter.  Meds ordered this encounter  Medications  . cyclobenzaprine (FLEXERIL) 10 MG tablet    Sig: Take 1 tablet (10 mg total) by mouth 2 (two) times daily as needed for muscle spasms.    Dispense:  30 tablet    Refill:  0      Procedures: No procedures performed   Clinical Data: No additional findings.   Subjective: Chief Complaint  Patient presents with  . Neck - Pain    HPI 59 year old male seen with request of Dr. Magnus Ivan for cervical  spondylosis with right arm pain and right triceps weakness.  Patient been taking Flexeril and gabapentin for relief.  MRI scan done on 07/20/2019 showed C6-7 disc bulge superimposed broad-based right central and right foraminal disc protrusion encroaching on the right C7 nerve root.  C5-6 had moderate multifactorial stenosis with broad-based disc bulge contacting and flattening the ventral cord with bilateral neuroforaminal narrowing moderate right and mild left.  Patient has been taking Neurontin doing stretching exercises also used gabapentin with slight relief of  current neck and arm pain.  Past history of pulmonary edema atrial fib with rapid ventricular rate.  Positive for hypertension hypothyroidism.  Patient taken Medrol Dosepak with slight improvement.  Review of Systems negative for chills or fever.  No current chest pain or dyspnea.  Positive for atrial fib rapid ventricular rate, heart failure.  Previous cardioversion 05/10/2019 Dr. Jens Som.   Objective: Vital Signs: BP (!) 134/93   Pulse (!) 57   Ht 5\' 9"  (1.753 m)   Wt 150 lb (68 kg)   BMI 22.15 kg/m   Physical Exam Constitutional:      Appearance: He is well-developed.  HENT:     Head: Normocephalic and atraumatic.  Eyes:     Pupils: Pupils are equal, round, and reactive to light.  Neck:     Thyroid: No thyromegaly.  Trachea: No tracheal deviation.  Cardiovascular:     Rate and Rhythm: Normal rate.  Pulmonary:     Effort: Pulmonary effort is normal.     Breath sounds: No wheezing.  Abdominal:     General: Bowel sounds are normal.     Palpations: Abdomen is soft.  Skin:    General: Skin is warm and dry.     Capillary Refill: Capillary refill takes less than 2 seconds.  Neurological:     Mental Status: He is alert and oriented to person, place, and time.  Psychiatric:        Behavior: Behavior normal.        Thought Content: Thought content normal.        Judgment: Judgment normal.     Ortho Exam patient has  brachial plexus tenderness on the right negative on the left.  Positive Spurling on the right.  Triceps is weak one half grade with resistive testing trace wrist flexion weakness no finger extension weakness.  Ulnar nerve at the cubital tunnel is normal.  Biceps wrist extension is strong.  Flexors and interossei are strong right and left.  There is trace triceps reflex on the right 2+ on the left.  Biceps and brachioradialis are 2+ and symmetrical.  No lower extremity clonus.  No lower extremity hyperreflexia.  Specialty Comments:  No specialty comments available.  Imaging: No results found.   PMFS History: Patient Active Problem List   Diagnosis Date Noted  . Spinal stenosis of cervical region 08/13/2019  . Protrusion of cervical intervertebral disc 08/13/2019  . Shoulder pain   . Chronic combined systolic and diastolic heart failure ([place])   . Atrial fibrillation with RVR ([place]) 05/07/2019  . Hypertension 05/07/2019  . Hypothyroidism 05/07/2019  . Hypomagnesemia 05/07/2019  . Elevated troponin 05/07/2019  . Acute pulmonary edema ([facility])    Past Medical History:  Diagnosis Date  . Anemia   . Atrial fibrillation ([place])   . Hypertension   . Low back pain     Family History  Problem Relation Age of Onset  . Diabetes Mother   . Diabetes Sister   . Mental illness Sister   . Mental illness Brother     Past Surgical History:  Procedure Laterality Date  . CARDIOVERSION N/A 05/10/2019   Procedure: CARDIOVERSION;  Surgeon: Lelon Perla, MD;  Location: [facility] ENDOSCOPY;  Service: Cardiovascular;  Laterality: N/A;  . COLONOSCOPY  07/12/2013  . TEE WITHOUT CARDIOVERSION N/A 05/10/2019   Procedure: TRANSESOPHAGEAL ECHOCARDIOGRAM (TEE);  Surgeon: Lelon Perla, MD;  Location: [facility] ENDOSCOPY;  Service: Cardiovascular;  Laterality: N/A;   Social History   Occupational History  . Not on file  Tobacco Use  . Smoking status: Former Smoker    Quit date: 12/21/2018    Years since quitting:  0.6  . Smokeless tobacco: Never Used  Substance and Sexual Activity  . Alcohol use: Not Currently  . Drug use: Not on file  . Sexual activity: Not on file

## 2019-08-09 MED FILL — CYCLOBENZAPRINE 10 MG TAB: 10 | 15 days supply | Qty: 30 | Fill #0

## 2019-08-13 ENCOUNTER — Telehealth: Payer: Self-pay

## 2019-08-13 DIAGNOSIS — M4802 Spinal stenosis, cervical region: Secondary | ICD-10-CM | POA: Insufficient documentation

## 2019-08-13 DIAGNOSIS — M5020 Other cervical disc displacement, unspecified cervical region: Secondary | ICD-10-CM | POA: Insufficient documentation

## 2019-08-13 NOTE — Telephone Encounter (Signed)
   [facility] Pre-operative Risk Assessment    HEARTCARE STAFF: - Please ensure there is not already an duplicate clearance open for this procedure. - Under Visit Info/Reason for Call, type in Other and utilize the format Clearance MM/DD/YY or Clearance TBD. Do not use dashes or single digits. - If request is for dental extraction, please clarify the # of teeth to be extracted.  Request for surgical clearance:  1. What type of surgery is being performed? CERVICAL FUSION    2. When is this surgery scheduled? TBD   3. What type of clearance is required (medical clearance vs. Pharmacy clearance to hold med vs. Both)? BOTH  4. Are there any medications that need to be held prior to surgery and how long? NOT LISTED PT TAKES ELIQUIS   5. Practice name and name of physician performing surgery? [facility] MARK C YATES,MD    6. What is the office phone number? [phone]   7.   What is the office fax number? [phone]  8.   Anesthesia type (None, local, MAC, general) ? GENERAL

## 2019-08-14 NOTE — Telephone Encounter (Signed)
Left detailed VM for Debbie Dr Ophelia [facility] surgery scheduler that pt needs cardiac cath before we can give clearance.  Left detailed message via [place] intrepter to call back ASAP to schedule cardiac procedure.

## 2019-08-14 NOTE — Telephone Encounter (Signed)
Can you please comment on how long Eliquis can be held for cervical fusion?  Thank you!

## 2019-08-14 NOTE — Telephone Encounter (Signed)
Patient with diagnosis of Afib with RVR on Eliquis 5 mg twice daily (appropriate dose) for anticoagulation.    Procedure: cervical fusion Date of procedure: TBD  CHADS2-VASc score of  3 (CHF, HTN, CAD)  CrCl 70 mL/min (IBW) Platelet count 234 (04/2019)  Per office protocol, patient can hold Eliquis for 3 days prior to procedure.

## 2019-08-15 ENCOUNTER — Ambulatory Visit (INDEPENDENT_AMBULATORY_CARE_PROVIDER_SITE_OTHER): Payer: Medicaid Other | Admitting: Nurse Practitioner

## 2019-08-15 ENCOUNTER — Other Ambulatory Visit: Payer: Self-pay

## 2019-08-15 VITALS — BP 124/88 | HR 83 | Temp 97.8°F | Resp 20 | Wt 161.2 lb

## 2019-08-15 DIAGNOSIS — E038 Other specified hypothyroidism: Secondary | ICD-10-CM | POA: Diagnosis not present

## 2019-08-15 DIAGNOSIS — M4802 Spinal stenosis, cervical region: Secondary | ICD-10-CM

## 2019-08-15 DIAGNOSIS — I4891 Unspecified atrial fibrillation: Secondary | ICD-10-CM

## 2019-08-15 DIAGNOSIS — Z008 Encounter for other general examination: Secondary | ICD-10-CM | POA: Diagnosis not present

## 2019-08-15 DIAGNOSIS — I10 Essential (primary) hypertension: Secondary | ICD-10-CM

## 2019-08-15 NOTE — Telephone Encounter (Signed)
Left detailed message to call back ASAP to schedule Cath via Theodoro Doing at [facility] interpreters.

## 2019-08-15 NOTE — Progress Notes (Signed)
[facility] [place], [place]  [address] Phone:  [phone]   Fax:  [phone]  Established Patient Office Visit  Subjective:  Patient ID: Seth Snyder, male    DOB: [birth year]  Age: 59 y.o. MRN: [id number]  CC:  Chief Complaint  Patient presents with  . Follow-up    Patient is wanting medical clearance for shoulder surgery    HPI Seth Snyder [facility] presents for surgical clearance. He  has a past medical history of Anemia, Atrial fibrillation ([place]), Hypertension, and Low back pain.   He is in today for surgical clearance.  He has recently been diagnosed with cervical spondylosis, C5-6 moderate spinal stenosis C6-7 disc bulge on MRI.  He has a history of atrial fibrillation and is status post cardioversion. He has a history of sharp severe right shoulder and arm pain with numbness and weakness.  The pain brought on depression. He has failed shoulder treatments both interventional and analgesia.  Past Medical History:  Diagnosis Date  . Anemia   . Atrial fibrillation ([place])   . Hypertension   . Low back pain     Past Surgical History:  Procedure Laterality Date  . CARDIOVERSION N/A 05/10/2019   Procedure: CARDIOVERSION;  Surgeon: Lelon Perla, MD;  Location: [facility] ENDOSCOPY;  Service: Cardiovascular;  Laterality: N/A;  . COLONOSCOPY  07/12/2013  . TEE WITHOUT CARDIOVERSION N/A 05/10/2019   Procedure: TRANSESOPHAGEAL ECHOCARDIOGRAM (TEE);  Surgeon: Lelon Perla, MD;  Location: [facility] ENDOSCOPY;  Service: Cardiovascular;  Laterality: N/A;    Family History  Problem Relation Age of Onset  . Diabetes Mother   . Diabetes Sister   . Mental illness Sister   . Mental illness Brother     Social History   Socioeconomic History  . Marital status: Single    Spouse name: Not on file  . Number of children: Not on file  . Years of education: Not on file  . Highest education level: Not on file  Occupational History  . Not on file    Tobacco Use  . Smoking status: Former Smoker    Quit date: 12/21/2018    Years since quitting: 0.6  . Smokeless tobacco: Never Used  Substance and Sexual Activity  . Alcohol use: Not Currently  . Drug use: Not on file  . Sexual activity: Not on file  Other Topics Concern  . Not on file  Social History Narrative  . Not on file   Social Determinants of Health   Financial Resource Strain:   . Difficulty of Paying Living Expenses:   Food Insecurity:   . Worried About Charity fundraiser in the Last Year:   . Arboriculturist in the Last Year:   Transportation Needs:   . Film/video editor (Medical):   Marland Kitchen Lack of Transportation (Non-Medical):   Physical Activity:   . Days of Exercise per Week:   . Minutes of Exercise per Session:   Stress:   . Feeling of Stress :   Social Connections:   . Frequency of Communication with Friends and Family:   . Frequency of Social Gatherings with Friends and Family:   . Attends Religious Services:   . Active Member of Clubs or Organizations:   . Attends Archivist Meetings:   Marland Kitchen Marital Status:   Intimate Partner Violence:   . Fear of Current or Ex-Partner:   . Emotionally Abused:   Marland Kitchen Physically Abused:   .  Sexually Abused:     Outpatient Medications Prior to Visit  Medication Sig Dispense Refill  . acetaminophen (TYLENOL) 500 MG tablet Take 500 mg by mouth daily as needed for mild pain.    Marland Kitchen apixaban (ELIQUIS) 5 MG TABS tablet Take 1 tablet (5 mg total) by mouth 2 (two) times daily. 60 tablet 2  . atorvastatin (LIPITOR) 40 MG tablet Take 1 tablet (40 mg total) by mouth daily at 6 PM. 90 tablet 3  . cyclobenzaprine (FLEXERIL) 10 MG tablet Take 1 tablet (10 mg total) by mouth 2 (two) times daily as needed for muscle spasms. 30 tablet 0  . gabapentin (NEURONTIN) 300 MG capsule TAKE 1 CAPSULE BY MOUTH EVERYDAY AT BEDTIME 30 capsule 1  . metoprolol succinate (TOPROL-XL) 100 MG 24 hr tablet Take 2 tablets (200 mg total) by mouth  daily. Take with or immediately following a meal. 90 tablet 7   Facility-Administered Medications Prior to Visit  Medication Dose Route Frequency Provider Last Rate Last Admin  . magic mouthwash w/lidocaine  5 mL Oral QID Barbette Merino, NP        No Known Allergies  ROS Review of Systems    Objective:    Physical Exam  Constitutional: He is oriented to person, place, and time. He appears well-developed. He appears distressed.  HENT:  Head: Normocephalic.  Cardiovascular: Normal rate.  Irregular  Pulmonary/Chest: Effort normal and breath sounds normal.  Abdominal: Soft.  Hypoactive  Neurological: He is alert and oriented to person, place, and time.  Skin: Skin is warm.  Psychiatric: He has a normal mood and affect. His behavior is normal. Judgment and thought content normal.    BP 124/88 (BP Location: Left Arm, Patient Position: Sitting)   Pulse 83   Temp 97.8 F (36.6 C) (Oral)   Resp 20   Wt 161 lb 3.2 oz (73.1 kg)   SpO2 98%   BMI 23.81 kg/m  Wt Readings from Last 3 Encounters:  08/15/19 161 lb 3.2 oz (73.1 kg)  08/08/19 150 lb (68 kg)  06/13/19 157 lb (71.2 kg)     Health Maintenance Due  Topic Date Due  . Hepatitis C Screening  Never done  . COLONOSCOPY  Never done    There are no preventive care reminders to display for this patient.  Lab Results  Component Value Date   TSH 12.771 (H) 05/07/2019   Lab Results  Component Value Date   WBC 7.0 05/10/2019   HGB 14.1 05/10/2019   HCT 43.1 05/10/2019   MCV 83.2 05/10/2019   PLT 234 05/10/2019   Lab Results  Component Value Date   NA 136 05/10/2019   K 4.4 05/10/2019   CO2 20 (L) 05/10/2019   GLUCOSE 106 (H) 05/10/2019   BUN 17 05/10/2019   CREATININE 1.17 05/10/2019   BILITOT 1.7 (H) 05/08/2019   ALKPHOS 96 05/08/2019   AST 33 05/08/2019   ALT 33 05/08/2019   PROT 7.2 05/08/2019   ALBUMIN 3.8 05/08/2019   CALCIUM 9.2 05/10/2019   ANIONGAP 10 05/10/2019   Lab Results  Component Value  Date   CHOL 163 05/07/2019   Lab Results  Component Value Date   HDL 58 05/07/2019   Lab Results  Component Value Date   LDLCALC 88 05/07/2019   Lab Results  Component Value Date   TRIG 84 05/07/2019   Lab Results  Component Value Date   CHOLHDL 2.8 05/07/2019   No results found for: HGBA1C  Assessment & Plan:   Problem List Items Addressed This Visit      Unprioritized   Atrial fibrillation with RVR ([facility])   Hypertension   Hypothyroidism   Relevant Orders   TSH   T4, free   Spinal stenosis of cervical region    Other Visit Diagnoses    Encounter for medical clearance for patient hold    -  Primary   Relevant Orders   CBC with Differential/Platelet   Comp. Metabolic Panel (12)   Lipid panel      No orders of the defined types were placed in this encounter.   Follow-up: Return in about 3 months (around 11/15/2019) for may cancel June apt.    Barbette Merino, NP

## 2019-08-16 LAB — CBC WITH DIFFERENTIAL/PLATELET
Basophils Absolute: 0 10*3/uL (ref 0.0–0.2)
Basos: 1 %
EOS (ABSOLUTE): 0.3 10*3/uL (ref 0.0–0.4)
Eos: 4 %
Hematocrit: 39.8 % (ref 37.5–51.0)
Hemoglobin: 13.2 g/dL (ref 13.0–17.7)
Immature Grans (Abs): 0 10*3/uL (ref 0.0–0.1)
Immature Granulocytes: 0 %
Lymphocytes Absolute: 2.4 10*3/uL (ref 0.7–3.1)
Lymphs: 35 %
MCH: 28.4 pg (ref 26.6–33.0)
MCHC: 33.2 g/dL (ref 31.5–35.7)
MCV: 86 fL (ref 79–97)
Monocytes Absolute: 0.8 10*3/uL (ref 0.1–0.9)
Monocytes: 11 %
Neutrophils Absolute: 3.5 10*3/uL (ref 1.4–7.0)
Neutrophils: 49 %
Platelets: 204 10*3/uL (ref 150–450)
RBC: 4.65 x10E6/uL (ref 4.14–5.80)
RDW: 13.1 % (ref 11.6–15.4)
WBC: 7 10*3/uL (ref 3.4–10.8)

## 2019-08-16 LAB — LIPID PANEL
Chol/HDL Ratio: 2.3 ratio (ref 0.0–5.0)
Cholesterol, Total: 94 mg/dL — ABNORMAL LOW (ref 100–199)
HDL: 41 mg/dL (ref >39)
LDL Chol Calc (NIH): 35 mg/dL (ref 0–99)
Triglycerides: 93 mg/dL (ref 0–149)
VLDL Cholesterol Cal: 18 mg/dL (ref 5–40)

## 2019-08-16 LAB — COMP. METABOLIC PANEL (12)
AST: 28 IU/L (ref 0–40)
Albumin/Globulin Ratio: 1.8 (ref 1.2–2.2)
Albumin: 4.4 g/dL (ref 3.8–4.9)
Alkaline Phosphatase: 96 IU/L (ref 48–121)
BUN/Creatinine Ratio: 16 (ref 9–20)
BUN: 16 mg/dL (ref 6–24)
Bilirubin Total: 0.8 mg/dL (ref 0.0–1.2)
Calcium: 9.3 mg/dL (ref 8.7–10.2)
Chloride: 107 mmol/L — ABNORMAL HIGH (ref 96–106)
Creatinine, Ser: 1.03 mg/dL (ref 0.76–1.27)
GFR calc Af Amer: 93 mL/min/{1.73_m2} (ref >59)
GFR calc non Af Amer: 80 mL/min/{1.73_m2} (ref >59)
Globulin, Total: 2.5 g/dL (ref 1.5–4.5)
Glucose: 109 mg/dL — ABNORMAL HIGH (ref 65–99)
Potassium: 4.1 mmol/L (ref 3.5–5.2)
Sodium: 139 mmol/L (ref 134–144)
Total Protein: 6.9 g/dL (ref 6.0–8.5)

## 2019-08-16 NOTE — Telephone Encounter (Signed)
Left detailed message.   

## 2019-08-16 NOTE — Progress Notes (Signed)
Labs stable, surgical clearance form completed and ready to be faxed.

## 2019-08-17 LAB — TSH: TSH: 3.96 u[IU]/mL (ref 0.450–4.500)

## 2019-08-17 LAB — SPECIMEN STATUS REPORT

## 2019-08-22 NOTE — Telephone Encounter (Signed)
Left message, stating that pt needs cath before we can give clearance.

## 2019-08-24 ENCOUNTER — Ambulatory Visit: Payer: Self-pay | Admitting: Cardiovascular Disease

## 2019-08-26 NOTE — H&P (View-Only) (Signed)
Cardiology Clinic Note   Patient Name: Seth Snyder Date of Encounter: 08/27/2019  Primary Care Provider:  Barbette Merino, NP Primary Cardiologist:  Chilton Si, MD  Patient Profile    Seth Snyder 59 year old male presents today for follow-up of his atrial fibrillation, HTN, CAD, and combined systolic and diastolic heart failure.  Past Medical History    Past Medical History:  Diagnosis Date  . Anemia   . Atrial fibrillation (HCC)   . Hypertension   . Low back pain    Past Surgical History:  Procedure Laterality Date  . CARDIOVERSION N/A 05/10/2019   Procedure: CARDIOVERSION;  Surgeon: Lewayne Bunting, MD;  Location: [facility] ENDOSCOPY;  Service: Cardiovascular;  Laterality: N/A;  . COLONOSCOPY  07/12/2013  . TEE WITHOUT CARDIOVERSION N/A 05/10/2019   Procedure: TRANSESOPHAGEAL ECHOCARDIOGRAM (TEE);  Surgeon: Lewayne Bunting, MD;  Location: [facility] ENDOSCOPY;  Service: Cardiovascular;  Laterality: N/A;    Allergies  No Known Allergies  History of Present Illness    Seth Snyder has a PMH of paroxysmal atrial fibrillation/flutter that was diagnosed in November/December 2020 in [place].  He presented to [facility] and was admitted 02/12/2019-02/17/2019 for hypertensive urgency and A. fib/flutter.  He was started on Eliquis 5 mg twice daily, aspirin 81 mg and metoprolol ER 100 mg.  He followed up with his PCP 03/12/2019 and was stable.  At that time he indicated that he was moving from [place] to [facility]  His PMH also includes hypothyroidism, lower extremity venous insufficiency, HTN, anemia, and tobacco abuse.  He was admitted to the hospital 05/07/2019-05/10/2019 with A. fib RVR.  His echocardiogram 05/07/2019 showed an LVEF of 45-50%, diastolic dysfunction could not be evaluated, mildly elevated pulmonary arterial pressures, mild mitral regurgitation, tricuspid regurgitation mild-moderate, and mild aortic regurgitation.  He underwent TEE and DCCV 05/10/2019 which  was successful.  (120 J to normal sinus rhythm, atrial fibrillation recurred and repeat DCCV with 150 J resulted in sinus rhythm ). He was started on metoprolol 150 mg and due to his LV function, age, and lack of insurance options for antiarrhythmics were limited.  His thyroid function was also noted to be mildly abnormal making amiodarone an undesirable option.  Apixaban 5mg  twice daily was continued.  He presented to the clinic 05/16/19 for follow-up and stated he was having right shoulder pain.  This had been ongoing for several months.  He did not recall any fall, work-related injury or trauma.  His pain was reproducible in the office  with light and deep palpation.  When asked about his breathing and his heart rate he stated he felt well.  He had not noticed any irregular heartbeats or increased heart rate since his cardioversion.  He had been compliant with Eliquis.  He underwent a nuclear stress test on 05/23/2019.  There was no ST deviation during his stress test.  2 defects were noted.  There was a fixed defect at the apex that suggested infarct and a reversible inferior defect that suggested ischemia.  Cardiac catheterization was recommended.  Several attempts were made to contact the patient and detailed messages were left about scheduling a cardiac catheterization.  A translated letter was also sent.  He presents the clinic today for follow-up evaluation and states he does not have any chest pain.  He is no longer working due to his right shoulder and neck pain.  His stress test results were reviewed and he is agreeable to cardiac catheterization.  I explained to him that  in order to give him preoperative cardiac clearance the procedure needs to be done.  He states he has had occasional periods of palpitations but overall feels well today except for his neck and shoulder pain.  Today he denies  shortness of breath, lower extremity edema, fatigue, palpitations, melena, hematuria, hemoptysis,  diaphoresis, presyncope, syncope, orthopnea, and PND.  Home Medications    Prior to Admission medications   Medication Sig Start Date End Date Taking? Authorizing Provider  acetaminophen (TYLENOL) 500 MG tablet Take 500 mg by mouth daily as needed for mild pain.    [provider]  apixaban (ELIQUIS) 5 MG TABS tablet Take 1 tablet (5 mg total) by mouth 2 (two) times daily. 06/13/19   Vevelyn Francois, NP  atorvastatin (LIPITOR) 40 MG tablet Take 1 tablet (40 mg total) by mouth daily at 6 PM. 06/10/19   Cecil Bixby, Jossie Ng, NP  cyclobenzaprine (FLEXERIL) 10 MG tablet Take 1 tablet (10 mg total) by mouth 2 (two) times daily as needed for muscle spasms. 08/08/19   Marybelle Killings, MD  gabapentin (NEURONTIN) 300 MG capsule TAKE 1 CAPSULE BY MOUTH EVERYDAY AT BEDTIME 07/30/19   Mcarthur Rossetti, MD  metoprolol succinate (TOPROL-XL) 100 MG 24 hr tablet Take 2 tablets (200 mg total) by mouth daily. Take with or immediately following a meal. 06/10/19   Dewane Timson, Jossie Ng, NP    Family History    Family History  Problem Relation Age of Onset  . Diabetes Mother   . Diabetes Sister   . Mental illness Sister   . Mental illness Brother    He indicated that his mother is deceased. He indicated that his father is alive. He indicated that all of his four sisters are alive. He indicated that his brother is alive.  Social History    Social History   Socioeconomic History  . Marital status: Single    Spouse name: Not on file  . Number of children: Not on file  . Years of education: Not on file  . Highest education level: Not on file  Occupational History  . Not on file  Tobacco Use  . Smoking status: Former Smoker    Quit date: 12/21/2018    Years since quitting: 0.6  . Smokeless tobacco: Never Used  Substance and Sexual Activity  . Alcohol use: Not Currently  . Drug use: Not on file  . Sexual activity: Not on file  Other Topics Concern  . Not on file  Social History Narrative  . Not  on file   Social Determinants of Health   Financial Resource Strain:   . Difficulty of Paying Living Expenses:   Food Insecurity:   . Worried About Charity fundraiser in the Last Year:   . Arboriculturist in the Last Year:   Transportation Needs:   . Film/video editor (Medical):   Marland Kitchen Lack of Transportation (Non-Medical):   Physical Activity:   . Days of Exercise per Week:   . Minutes of Exercise per Session:   Stress:   . Feeling of Stress :   Social Connections:   . Frequency of Communication with Friends and Family:   . Frequency of Social Gatherings with Friends and Family:   . Attends Religious Services:   . Active Member of Clubs or Organizations:   . Attends Archivist Meetings:   Marland Kitchen Marital Status:   Intimate Partner Violence:   . Fear of Current or Ex-Partner:   .  Emotionally Abused:   Marland Kitchen Physically Abused:   . Sexually Abused:      Review of Systems    General:  No chills, fever, night sweats or weight changes.  Cardiovascular:  No chest pain, dyspnea on exertion, edema, orthopnea, palpitations, paroxysmal nocturnal dyspnea. Dermatological: No rash, lesions/masses Respiratory: No cough, dyspnea Urologic: No hematuria, dysuria Abdominal:   No nausea, vomiting, diarrhea, bright red blood per rectum, melena, or hematemesis Neurologic:  No visual changes, wkns, changes in mental status. All other systems reviewed and are otherwise negative except as noted above.  Physical Exam    VS:  BP 110/80 (BP Location: Left Arm, Patient Position: Sitting, Cuff Size: Normal)   Pulse (!) 117   Ht 5\' 7"  (1.702 m)   Wt 163 lb 3.2 oz (74 kg)   BMI 25.56 kg/m  , BMI Body mass index is 25.56 kg/m. GEN: Well nourished, well developed, in no acute distress. HEENT: normal. Neck: Supple, no JVD, carotid bruits, or masses. Cardiac: RRR, no murmurs, rubs, or gallops. No clubbing, cyanosis, edema.  Radials/DP/PT 2+ and equal bilaterally.  Respiratory:  Respirations  regular and unlabored, clear to auscultation bilaterally. GI: Soft, nontender, nondistended, BS + x 4. MS: no deformity or atrophy. Skin: warm and dry, no rash. Neuro:  Strength and sensation are intact. Psych: Normal affect.  Accessory Clinical Findings    ECG personally reviewed by me today-atrial fibrillation with RVR 117 bpm  EKG 05/16/2019 atrial fibrillation moderate voltage criteria for LVH 90 bpm  EKG 05/10/2019 Normal sinus rhythm 60 bpm  Echocardiogram 05/07/2019 IMPRESSIONS    1. Left ventricular ejection fraction, by estimation, is 45 to 50%. The  left ventricle has mildly decreased function. The left ventricle  demonstrates global hypokinesis. Left ventricular diastolic function could  not be evaluated.  2. Right ventricular systolic function is low normal. The right  ventricular size is normal. There is mildly elevated pulmonary artery  systolic pressure.  3. The mitral valve is grossly normal. Mild mitral valve regurgitation.  4. Tricuspid valve regurgitation is mild to moderate.  5. The aortic valve is tricuspid. Aortic valve regurgitation is mild. No  aortic stenosis is present.  6. There is mild dilatation of the ascending aorta measuring 39 mm.  7. The inferior vena cava is dilated in size with >50% respiratory  variability, suggesting right atrial pressure of 8 mmHg.   TEE 05/10/2019 IMPRESSIONS    1. Moderate to severe global reduction in LV systolic function; mild AI  and MR; mild LAE; spontaneous contrast in LAA but no thrombus; mild RAE  and RVE; moderate TR.  2. Left ventricular ejection fraction, by estimation, is 30 to 35%. The  left ventricle has moderately decreased function. The left ventricle  demonstrates global hypokinesis. Left ventricular diastolic function could  not be evaluated.  3. Right ventricular systolic function is normal. The right ventricular  size is mildly enlarged.  4. Left atrial size was mildly dilated. No  left atrial/left atrial  appendage thrombus was detected.  5. Right atrial size was mildly dilated.  6. The mitral valve is normal in structure and function. Mild mitral  valve regurgitation.  7. Tricuspid valve regurgitation is moderate.  8. The aortic valve is tricuspid. Aortic valve regurgitation is mild.  9. There is mild (Grade II) plaque involving the descending aorta.   Conclusion(s)/Recomendation(s): No LA/LAA thrombus identified. Successful  cardioversion performed with restoration of normal sinus rhythm.  Nuclear stress test 05/23/2019  There was no ST segment  deviation noted during stress.  Study was not gated due to atrial fibrillation  Defect 1: There is a small fixed defect of mild severity present in the apex location.  Defect 2: There is a small reversible defect of mild severity present in the basal inferior, mid inferior and apical inferior location.  Findings consistent with ischemia and prior myocardial infarction.  This is a low risk study given small area of ischemia.   Fixed defect at apex, suggests infarct.  Reversible inferior defect, suggests ischemia.  Assessment & Plan   1.  Coronary artery disease-no chest pain today.  Previous nuclear stress test 05/23/2019 showed 2 defects.  One being reversible and inferior in nature.  Clinic attempted to contact patient several times to schedule cardiac catheterization.  Patient would also like preoperative clearance for upcoming surgery.  To better assess his cardiac risk cardiac catheterization is recommended.  Discuss holding Eliquis and instructions provided. Order cardiac catheterization  The patient understands that risks include but are not limited to stroke (1 in 1000), death (1 in 1000), kidney failure [usually temporary] (1 in 500), bleeding (1 in 200), allergic reaction [possibly serious] (1 in 200), and agrees to proceed.    Atrial fibrillation with RVR-heart rate today 117 bpm.  EKG 3/21 showed atrial  fibrillation 90 bpm.  TEE with DCCV x2 on 05/10/2019.  I previously been seen in [place] and underwent TEE and cardioversion on 01/2019.  Then relocated to [facility] and ran out of his medications.  He  continues to be cardiac unaware and feels his breathing is at his baseline. Continue metoprolol succinate 200 mg daily Continue apixaban 5 mg tablet twice daily Avoid triggers caffeine, chocolate, EtOH etc. Order nuclear stress test to evaluate for ischemia.  Essential hypertension-BP today 110/80. Continue metoprolol succinate 200 mg daily Heart healthy low-sodium diet Increase physical activity as tolerated  Chronic systolic and diastolic CHF-appears euvolemic today.  His LVEF was 45-50% on echocardiogram.  His EF was 30-35% on his TEE.  This was felt to be related to his HR.  Anticipated outpatient ischemic evaluation. Continue metoprolol 200 mg daily Continue heart healthy low-sodium diet Daily weights  Aortic atherosclerosis-goal LDL less than 70. 05/07/2019: Cholesterol 163; HDL 58; LDL Cholesterol 88; Triglycerides 84; VLDL 17 Continue atorvastatin 40 mg daily Heart healthy low-sodium high-fiber diet Increase physical activity as tolerated  Right shoulder pain/neck pain-painful flexion, extension, internal and external rotation.  No trauma, falls, work-related injuries.  Pain has been present for 3 more months.  Primary referred to orthopedics.  Preoperative cardiac evaluation-unable to clear from a cardiac standpoint at this time.  Due to previous stress testing. Order cardiac catheterization  Disposition: Follow-up with Dr. Duke Salvia after cardiac catheterization.  [name]. Kaegan Stigler NP-C    08/27/2019, 9:16 AM [facility] [address] Office [phone] Fax [phone]

## 2019-08-26 NOTE — Progress Notes (Signed)
 Cardiology Clinic Note   Patient Name: Seth Snyder Date of Encounter: 08/27/2019  Primary Care Provider:  King, [name] M, NP Primary Cardiologist:  Tiffany [place], MD  Patient Profile    Seth Snyder 59-year-old male presents today for follow-up of his atrial fibrillation, HTN, CAD, and combined systolic and diastolic heart failure.  Past Medical History    Past Medical History:  Diagnosis Date  . Anemia   . Atrial fibrillation (HCC)   . Hypertension   . Low back pain    Past Surgical History:  Procedure Laterality Date  . CARDIOVERSION N/A 05/10/2019   Procedure: CARDIOVERSION;  Surgeon: Crenshaw, Brian S, MD;  Location: [facility];  Service: Cardiovascular;  Laterality: N/A;  . COLONOSCOPY  07/12/2013  . TEE WITHOUT CARDIOVERSION N/A 05/10/2019   Procedure: TRANSESOPHAGEAL ECHOCARDIOGRAM (TEE);  Surgeon: Crenshaw, Brian S, MD;  Location: [facility];  Service: Cardiovascular;  Laterality: N/A;    Allergies  No Known Allergies  History of Present Illness    Seth Snyder has a PMH of paroxysmal atrial fibrillation/flutter that was diagnosed in November/December 2020 in [place].  He presented to [facility] and was admitted 02/12/2019-02/17/2019 for hypertensive urgency and A. fib/flutter.  He was started on Eliquis 5 mg twice daily, aspirin 81 mg and metoprolol ER 100 mg.  He followed up with his PCP 03/12/2019 and was stable.  At that time he indicated that he was moving from [place] to [place].  His PMH also includes hypothyroidism, lower extremity venous insufficiency, HTN, anemia, and tobacco abuse.  He was admitted to the hospital 05/07/2019-05/10/2019 with A. fib RVR.  His echocardiogram 05/07/2019 showed an LVEF of 45-50%, diastolic dysfunction could not be evaluated, mildly elevated pulmonary arterial pressures, mild mitral regurgitation, tricuspid regurgitation mild-moderate, and mild aortic regurgitation.  He underwent TEE and DCCV 05/10/2019 which  was successful.  (120 J to normal sinus rhythm, atrial fibrillation recurred and repeat DCCV with 150 J resulted in sinus rhythm ). He was started on metoprolol 150 mg and due to his LV function, age, and lack of insurance options for antiarrhythmics were limited.  His thyroid function was also noted to be mildly abnormal making amiodarone an undesirable option.  Apixaban 5mg twice daily was continued.  He presented to the clinic 05/16/19 for follow-up and stated he was having right shoulder pain.  This had been ongoing for several months.  He did not recall any fall, work-related injury or trauma.  His pain was reproducible in the office  with light and deep palpation.  When asked about his breathing and his heart rate he stated he felt well.  He had not noticed any irregular heartbeats or increased heart rate since his cardioversion.  He had been compliant with Eliquis.  He underwent a nuclear stress test on 05/23/2019.  There was no ST deviation during his stress test.  2 defects were noted.  There was a fixed defect at the apex that suggested infarct and a reversible inferior defect that suggested ischemia.  Cardiac catheterization was recommended.  Several attempts were made to contact the patient and detailed messages were left about scheduling a cardiac catheterization.  A translated letter was also sent.  He presents the clinic today for follow-up evaluation and states he does not have any chest pain.  He is no longer working due to his right shoulder and neck pain.  His stress test results were reviewed and he is agreeable to cardiac catheterization.  I explained to him that   in order to give him preoperative cardiac clearance the procedure needs to be done.  He states he has had occasional periods of palpitations but overall feels well today except for his neck and shoulder pain.  Today he denies  shortness of breath, lower extremity edema, fatigue, palpitations, melena, hematuria, hemoptysis,  diaphoresis, presyncope, syncope, orthopnea, and PND.  Home Medications    Prior to Admission medications   Medication Sig Start Date End Date Taking? Authorizing Provider  acetaminophen (TYLENOL) 500 MG tablet Take 500 mg by mouth daily as needed for mild pain.    [provider]  apixaban (ELIQUIS) 5 MG TABS tablet Take 1 tablet (5 mg total) by mouth 2 (two) times daily. 06/13/19   Vevelyn Francois, NP  atorvastatin (LIPITOR) 40 MG tablet Take 1 tablet (40 mg total) by mouth daily at 6 PM. 06/10/19   Albaro Deviney, Jossie Ng, NP  cyclobenzaprine (FLEXERIL) 10 MG tablet Take 1 tablet (10 mg total) by mouth 2 (two) times daily as needed for muscle spasms. 08/08/19   Marybelle Killings, MD  gabapentin (NEURONTIN) 300 MG capsule TAKE 1 CAPSULE BY MOUTH EVERYDAY AT BEDTIME 07/30/19   Mcarthur Rossetti, MD  metoprolol succinate (TOPROL-XL) 100 MG 24 hr tablet Take 2 tablets (200 mg total) by mouth daily. Take with or immediately following a meal. 06/10/19   Zarria Towell, Jossie Ng, NP    Family History    Family History  Problem Relation Age of Onset  . Diabetes Mother   . Diabetes Sister   . Mental illness Sister   . Mental illness Brother    He indicated that his mother is deceased. He indicated that his father is alive. He indicated that all of his four sisters are alive. He indicated that his brother is alive.  Social History    Social History   Socioeconomic History  . Marital status: Single    Spouse name: Not on file  . Number of children: Not on file  . Years of education: Not on file  . Highest education level: Not on file  Occupational History  . Not on file  Tobacco Use  . Smoking status: Former Smoker    Quit date: 12/21/2018    Years since quitting: 0.6  . Smokeless tobacco: Never Used  Substance and Sexual Activity  . Alcohol use: Not Currently  . Drug use: Not on file  . Sexual activity: Not on file  Other Topics Concern  . Not on file  Social History Narrative  . Not  on file   Social Determinants of Health   Financial Resource Strain:   . Difficulty of Paying Living Expenses:   Food Insecurity:   . Worried About Charity fundraiser in the Last Year:   . Arboriculturist in the Last Year:   Transportation Needs:   . Film/video editor (Medical):   Marland Kitchen Lack of Transportation (Non-Medical):   Physical Activity:   . Days of Exercise per Week:   . Minutes of Exercise per Session:   Stress:   . Feeling of Stress :   Social Connections:   . Frequency of Communication with Friends and Family:   . Frequency of Social Gatherings with Friends and Family:   . Attends Religious Services:   . Active Member of Clubs or Organizations:   . Attends Archivist Meetings:   Marland Kitchen Marital Status:   Intimate Partner Violence:   . Fear of Current or Ex-Partner:   .  Emotionally Abused:   Marland Kitchen Physically Abused:   . Sexually Abused:      Review of Systems    General:  No chills, fever, night sweats or weight changes.  Cardiovascular:  No chest pain, dyspnea on exertion, edema, orthopnea, palpitations, paroxysmal nocturnal dyspnea. Dermatological: No rash, lesions/masses Respiratory: No cough, dyspnea Urologic: No hematuria, dysuria Abdominal:   No nausea, vomiting, diarrhea, bright red blood per rectum, melena, or hematemesis Neurologic:  No visual changes, wkns, changes in mental status. All other systems reviewed and are otherwise negative except as noted above.  Physical Exam    VS:  BP 110/80 (BP Location: Left Arm, Patient Position: Sitting, Cuff Size: Normal)   Pulse (!) 117   Ht 5\' 7"  (1.702 m)   Wt 163 lb 3.2 oz (74 kg)   BMI 25.56 kg/m  , BMI Body mass index is 25.56 kg/m. GEN: Well nourished, well developed, in no acute distress. HEENT: normal. Neck: Supple, no JVD, carotid bruits, or masses. Cardiac: RRR, no murmurs, rubs, or gallops. No clubbing, cyanosis, edema.  Radials/DP/PT 2+ and equal bilaterally.  Respiratory:  Respirations  regular and unlabored, clear to auscultation bilaterally. GI: Soft, nontender, nondistended, BS + x 4. MS: no deformity or atrophy. Skin: warm and dry, no rash. Neuro:  Strength and sensation are intact. Psych: Normal affect.  Accessory Clinical Findings    ECG personally reviewed by me today-atrial fibrillation with RVR 117 bpm  EKG 05/16/2019 atrial fibrillation moderate voltage criteria for LVH 90 bpm  EKG 05/10/2019 Normal sinus rhythm 60 bpm  Echocardiogram 05/07/2019 IMPRESSIONS    1. Left ventricular ejection fraction, by estimation, is 45 to 50%. The  left ventricle has mildly decreased function. The left ventricle  demonstrates global hypokinesis. Left ventricular diastolic function could  not be evaluated.  2. Right ventricular systolic function is low normal. The right  ventricular size is normal. There is mildly elevated pulmonary artery  systolic pressure.  3. The mitral valve is grossly normal. Mild mitral valve regurgitation.  4. Tricuspid valve regurgitation is mild to moderate.  5. The aortic valve is tricuspid. Aortic valve regurgitation is mild. No  aortic stenosis is present.  6. There is mild dilatation of the ascending aorta measuring 39 mm.  7. The inferior vena cava is dilated in size with >50% respiratory  variability, suggesting right atrial pressure of 8 mmHg.   TEE 05/10/2019 IMPRESSIONS    1. Moderate to severe global reduction in LV systolic function; mild AI  and MR; mild LAE; spontaneous contrast in LAA but no thrombus; mild RAE  and RVE; moderate TR.  2. Left ventricular ejection fraction, by estimation, is 30 to 35%. The  left ventricle has moderately decreased function. The left ventricle  demonstrates global hypokinesis. Left ventricular diastolic function could  not be evaluated.  3. Right ventricular systolic function is normal. The right ventricular  size is mildly enlarged.  4. Left atrial size was mildly dilated. No  left atrial/left atrial  appendage thrombus was detected.  5. Right atrial size was mildly dilated.  6. The mitral valve is normal in structure and function. Mild mitral  valve regurgitation.  7. Tricuspid valve regurgitation is moderate.  8. The aortic valve is tricuspid. Aortic valve regurgitation is mild.  9. There is mild (Grade II) plaque involving the descending aorta.   Conclusion(s)/Recomendation(s): No LA/LAA thrombus identified. Successful  cardioversion performed with restoration of normal sinus rhythm.  Nuclear stress test 05/23/2019  There was no ST segment  deviation noted during stress.  Study was not gated due to atrial fibrillation  Defect 1: There is a small fixed defect of mild severity present in the apex location.  Defect 2: There is a small reversible defect of mild severity present in the basal inferior, mid inferior and apical inferior location.  Findings consistent with ischemia and prior myocardial infarction.  This is a low risk study given small area of ischemia.   Fixed defect at apex, suggests infarct.  Reversible inferior defect, suggests ischemia.  Assessment & Plan   1.  Coronary artery disease-no chest pain today.  Previous nuclear stress test 05/23/2019 showed 2 defects.  One being reversible and inferior in nature.  Clinic attempted to contact patient several times to schedule cardiac catheterization.  Patient would also like preoperative clearance for upcoming surgery.  To better assess his cardiac risk cardiac catheterization is recommended.  Discuss holding Eliquis and instructions provided. Order cardiac catheterization  The patient understands that risks include but are not limited to stroke (1 in 1000), death (1 in 1000), kidney failure [usually temporary] (1 in 500), bleeding (1 in 200), allergic reaction [possibly serious] (1 in 200), and agrees to proceed.    Atrial fibrillation with RVR-heart rate today 117 bpm.  EKG 3/21 showed atrial  fibrillation 90 bpm.  TEE with DCCV x2 on 05/10/2019.  I previously been seen in [place] and underwent TEE and cardioversion on 01/2019.  Then relocated to [facility] and ran out of his medications.  He  continues to be cardiac unaware and feels his breathing is at his baseline. Continue metoprolol succinate 200 mg daily Continue apixaban 5 mg tablet twice daily Avoid triggers caffeine, chocolate, EtOH etc. Order nuclear stress test to evaluate for ischemia.  Essential hypertension-BP today 110/80. Continue metoprolol succinate 200 mg daily Heart healthy low-sodium diet Increase physical activity as tolerated  Chronic systolic and diastolic CHF-appears euvolemic today.  His LVEF was 45-50% on echocardiogram.  His EF was 30-35% on his TEE.  This was felt to be related to his HR.  Anticipated outpatient ischemic evaluation. Continue metoprolol 200 mg daily Continue heart healthy low-sodium diet Daily weights  Aortic atherosclerosis-goal LDL less than 70. 05/07/2019: Cholesterol 163; HDL 58; LDL Cholesterol 88; Triglycerides 84; VLDL 17 Continue atorvastatin 40 mg daily Heart healthy low-sodium high-fiber diet Increase physical activity as tolerated  Right shoulder pain/neck pain-painful flexion, extension, internal and external rotation.  No trauma, falls, work-related injuries.  Pain has been present for 3 more months.  Primary referred to orthopedics.  Preoperative cardiac evaluation-unable to clear from a cardiac standpoint at this time.  Due to previous stress testing. Order cardiac catheterization  Disposition: Follow-up with Dr. Duke Salvia after cardiac catheterization.  [name]. Halley Kincer NP-C    08/27/2019, 9:16 AM [facility] [address] Office [phone] Fax [phone]

## 2019-08-27 ENCOUNTER — Other Ambulatory Visit: Payer: Self-pay

## 2019-08-27 ENCOUNTER — Encounter: Payer: Self-pay | Admitting: General Practice

## 2019-08-27 ENCOUNTER — Ambulatory Visit (INDEPENDENT_AMBULATORY_CARE_PROVIDER_SITE_OTHER): Payer: Medicaid Other | Admitting: General Practice

## 2019-08-27 VITALS — BP 110/80 | HR 117 | Ht 67.0 in | Wt 163.2 lb

## 2019-08-27 DIAGNOSIS — I5042 Chronic combined systolic (congestive) and diastolic (congestive) heart failure: Secondary | ICD-10-CM

## 2019-08-27 DIAGNOSIS — I358 Other nonrheumatic aortic valve disorders: Secondary | ICD-10-CM | POA: Diagnosis not present

## 2019-08-27 DIAGNOSIS — I10 Essential (primary) hypertension: Secondary | ICD-10-CM

## 2019-08-27 DIAGNOSIS — I4891 Unspecified atrial fibrillation: Secondary | ICD-10-CM

## 2019-08-27 DIAGNOSIS — M25511 Pain in right shoulder: Secondary | ICD-10-CM

## 2019-08-27 DIAGNOSIS — M542 Cervicalgia: Secondary | ICD-10-CM

## 2019-08-27 LAB — CBC
Hematocrit: 36.7 % — ABNORMAL LOW (ref 37.5–51.0)
Hemoglobin: 12.1 g/dL — ABNORMAL LOW (ref 13.0–17.7)
MCH: 27.6 pg (ref 26.6–33.0)
MCHC: 33 g/dL (ref 31.5–35.7)
MCV: 84 fL (ref 79–97)
Platelets: 227 10*3/uL (ref 150–450)
RBC: 4.38 x10E6/uL (ref 4.14–5.80)
RDW: 13 % (ref 11.6–15.4)
WBC: 7.3 10*3/uL (ref 3.4–10.8)

## 2019-08-27 MED ORDER — SODIUM CHLORIDE 0.9% FLUSH: 3.0000 mL | Freq: Two times a day (BID) | INTRAVENOUS | Status: DC

## 2019-08-27 MED ORDER — ATORVASTATIN CALCIUM 40 MG PO TABS: 40.0000 mg | ORAL_TABLET | Freq: Every day | ORAL | 3 refills | Status: DC

## 2019-08-27 NOTE — Patient Instructions (Addendum)
Medication Instructions:  Your physician recommends that you continue on your current medications as directed. Please refer to the Current Medication list given to you today.  *If you need a refill on your cardiac medications before your next appointment, please call your pharmacy*   Lab Work: TODAY:  BMET & CBC  If you have labs (blood work) drawn today and your tests are completely normal, you will receive your results only by: Marland Kitchen MyChart Message (if you have MyChart) OR . A paper copy in the mail If you have any lab test that is abnormal or we need to change your treatment, we will call you to review the results.   Testing/Procedures: Your physician has requested that you have a cardiac catheterization. Cardiac catheterization is used to diagnose and/or treat various heart conditions. Doctors may recommend this procedure for a number of different reasons. The most common reason is to evaluate chest pain. Chest pain can be a symptom of coronary artery disease (CAD), and cardiac catheterization can show whether plaque is narrowing or blocking your heart's arteries. This procedure is also used to evaluate the valves, as well as measure the blood flow and oxygen levels in different parts of your heart. For further information please visit [email or web address] Please follow instruction sheet BELOW:     [facility] CARDIOVASCULAR DIVISION [facility] [address] [place] [address] [place] [address] Dept: [phone] Loc: [phone]  Abbie Seanpaul Preece  08/27/2019  You are scheduled for a Cardiac Catheterization on Thursday, June 10 with Dr. Lance Muss.  1. Please arrive at the [facility] (Main Entrance A) at [facility]: [address] [place], [place] [address] at 5:30 AM (This time is two hours before your procedure to ensure your preparation). Free valet parking service is available.   Special note: Every effort is made to have  your procedure done on time. Please understand that emergencies sometimes delay scheduled procedures.  2. Diet: Do not eat solid foods after midnight.  The patient may have clear liquids until 5am upon the day of the procedure.  3. Labs: WILL BE DONE TODAY AT THE OFFICE.  4. Medication instructions in preparation for your procedure:   Contrast Allergy: No  YOU WILL NEED TO GO TO [facility], [address], [place], [place] [address] TO GET COVID TESTING 08/28/2019 AT 1:45.  ONCE YOU HAVE BEEN TESTED, THEY WILL REQUIRE YOU TO GO HOME AND QUARANTINE UNTIL YOUR PROCEDURE IS COMPLETE ON Thursday 6/10.  Stop taking Eliquis (Apixiban) on Tuesday, June 7.   On the morning of your procedure, take your Aspirin and any morning medicines NOT listed above.  You may use sips of water.  5. Plan for one night stay--bring personal belongings. 6. Bring a current list of your medications and current insurance cards. 7. You MUST have a responsible person to drive you home. 8. Someone MUST be with you the first 24 hours after you arrive home or your discharge will be delayed. 9. Please wear clothes that are easy to get on and off and wear slip-on shoes.  Thank you for allowing [place] to care for you!   -- [facility]      Follow-Up: At [facility], you and your health needs are our priority.  As part of our continuing mission to provide you with exceptional heart care, we have created designated Provider Care Teams.  These Care Teams include your primary Cardiologist (physician) and Advanced  Practice Providers (APPs -  Physician Assistants and Nurse Practitioners) who all work together to provide you with the care you need, when you need it.  We recommend signing up for the patient portal called "MyChart".  Sign up information is provided on this After Visit Summary.  MyChart is used to connect with patients for Virtual Visits (Telemedicine).  Patients are  able to view lab/test results, encounter notes, upcoming appointments, etc.  Non-urgent messages can be sent to your provider as well.   To learn more about what you can do with MyChart, go to [email or web address].    Your next appointment:   2 week(s)   09/07/19 ARRIVE AT 11:00   The format for your next appointment:   In Person  Provider:   Chilton Si, MD or Edd Fabian, FNP   Other Instructions   Coronary Angiogram With Stent Coronary angiogram with stent placement is a procedure to widen or open a narrow blood vessel of the heart (coronary artery). Arteries may become blocked by cholesterol buildup (plaques) in the lining of the artery wall. When a coronary artery becomes partially blocked, blood flow to that area decreases. This may lead to chest pain or a heart attack (myocardial infarction). A stent is a small piece of metal that looks like mesh or spring. Stent placement may be done as treatment after a heart attack, or to prevent a heart attack if a blocked artery is found by a coronary angiogram. Let your health care provider know about:  Any allergies you have, including allergies to medicines or contrast dye.  All medicines you are taking, including vitamins, herbs, eye drops, creams, and over-the-counter medicines.  Any problems you or family members have had with anesthetic medicines.  Any blood disorders you have.  Any surgeries you have had.  Any medical conditions you have, including kidney problems or kidney failure.  Whether you are pregnant or may be pregnant.  Whether you are breastfeeding. What are the risks? Generally, this is a safe procedure. However, serious problems may occur, including:  Damage to nearby structures or organs, such as the heart, blood vessels, or kidneys.  A return of blockage.  Bleeding, infection, or bruising at the insertion site.  A collection of blood under the skin (hematoma) at the insertion site.  A blood  clot in another part of the body.  Allergic reaction to medicines or dyes.  Bleeding into the abdomen (retroperitoneal bleeding).  Stroke (rare).  Heart attack (rare). What happens before the procedure? Staying hydrated Follow instructions from your health care provider about hydration, which may include:  Up to 2 hours before the procedure - you may continue to drink clear liquids, such as water, clear fruit juice, black coffee, and plain tea.  Eating and drinking restrictions Follow instructions from your health care provider about eating and drinking, which may include:  8 hours before the procedure - stop eating heavy meals or foods, such as meat, fried foods, or fatty foods.  6 hours before the procedure - stop eating light meals or foods, such as toast or cereal.  2 hours before the procedure - stop drinking clear liquids. Medicines Ask your health care provider about:  Changing or stopping your regular medicines. This is especially important if you are taking diabetes medicines or blood thinners.  Taking medicines such as aspirin and ibuprofen. These medicines can thin your blood. Do not take these medicines unless your health care provider tells you to take them. ? Generally, aspirin is  recommended before a thin tube, called a catheter, is passed through a blood vessel and inserted into the heart (cardiac catheterization).  Taking over-the-counter medicines, vitamins, herbs, and supplements. General instructions  Do not use any products that contain nicotine or tobacco for at least 4 weeks before the procedure. These products include cigarettes, e-cigarettes, and chewing tobacco. If you need help quitting, ask your health care provider.  Plan to have someone take you home from the hospital or clinic.  If you will be going home right after the procedure, plan to have someone with you for 24 hours.  You may have tests and imaging procedures.  Ask your health care  provider: ? How your insertion site will be marked. Ask which artery will be used for the procedure. ? What steps will be taken to help prevent infection. These may include:  Removing hair at the insertion site.  Washing skin with a germ-killing soap.  Taking antibiotic medicine. What happens during the procedure?   An IV will be inserted into one of your veins.  Electrodes may be placed on your chest to monitor your heart rate during the procedure.  You will be given one or more of the following: ? A medicine to help you relax (sedative). ? A medicine to numb the area (local anesthetic) for catheter insertion.  A small incision will be made for catheter insertion.  The catheter will be inserted into an artery using a guide wire. The location may be in your groin, your wrist, or the fold of your arm (near your elbow).  An X-ray procedure (fluoroscopy) will be used to help guide the catheter to the opening of the heart arteries.  A dye will be injected into the catheter. X-rays will be taken. The dye helps to show where any narrowing or blockages are located in the arteries.  Tell your health care provider if you have chest pain or trouble breathing.  A tiny wire will be guided to the blocked spot, and a balloon will be inflated to make the artery wider.  The stent will be expanded to crush the plaques into the wall of the vessel. The stent will hold the area open and improve the blood flow. Most stents have a drug coating to reduce the risk of the stent narrowing over time.  The artery may be made wider using a drill, laser, or other tools that remove plaques.  The catheter will be removed when the blood flow improves. The stent will stay where it was placed, and the lining of the artery will grow over it.  A bandage (dressing) will be placed on the insertion site. Pressure will be applied to stop bleeding.  The IV will be removed. This procedure may vary among health care  providers and hospitals. What happens after the procedure?  Your blood pressure, heart rate, breathing rate, and blood oxygen level will be monitored until you leave the hospital or clinic.  If the procedure is done through the leg, you will lie flat in bed for a few hours or for as long as told by your health care provider. You will be instructed not to bend or cross your legs.  The insertion site and the pulse in your foot or wrist will be checked often.  You may have more blood tests, X-rays, and a test that records the electrical activity of your heart (electrocardiogram, or ECG).  Do not drive for 24 hours if you were given a sedative during your procedure.  Summary  Coronary angiogram with stent placement is a procedure to widen or open a narrowed coronary artery. This is done to treat heart problems.  Before the procedure, let your health care provider know about all the medical conditions and surgeries you have or have had.  This is a safe procedure. However, some problems may occur, including damage to nearby structures or organs, bleeding, blood clots, or allergies.  Follow your health care provider's instructions about eating, drinking, medicines, and other lifestyle changes, such as quitting tobacco use before the procedure. This information is not intended to replace advice given to you by your health care provider. Make sure you discuss any questions you have with your health care provider. Document Revised: 09/27/2018 Document Reviewed: 09/27/2018 Elsevier Patient Education  2020 [employer].

## 2019-08-28 ENCOUNTER — Other Ambulatory Visit (HOSPITAL_COMMUNITY)
Admission: RE | Admit: 2019-08-28 | Discharge: 2019-08-28 | Disposition: A | Discharge status: Home / Self Care | Payer: Medicaid Other | Source: Ambulatory Visit | Attending: Interventional Cardiology | Admitting: Interventional Cardiology

## 2019-08-28 DIAGNOSIS — Z20822 Contact with and (suspected) exposure to covid-19: Secondary | ICD-10-CM | POA: Insufficient documentation

## 2019-08-28 DIAGNOSIS — Z01812 Encounter for preprocedural laboratory examination: Secondary | ICD-10-CM | POA: Insufficient documentation

## 2019-08-28 LAB — SARS CORONAVIRUS 2 (TAT 6-24 HRS): SARS Coronavirus 2: NEGATIVE

## 2019-08-28 NOTE — Telephone Encounter (Signed)
Patient was seen by Edd Fabian, NP yesterday and cardiac catheretization has been scheduled for 08/30/19 with Dr. Eldridge Dace. The patient is scheduled for post cath f/u with Azalee Course, PA and clearance will be determined at that time. Will remove from pre-op pool.

## 2019-08-29 ENCOUNTER — Telehealth: Payer: Self-pay | Admitting: *Deleted

## 2019-08-29 NOTE — Addendum Note (Signed)
Addended by: Alyson Ingles on: 08/29/2019 10:16 AM   Modules accepted: Orders

## 2019-08-29 NOTE — Telephone Encounter (Signed)
BMP not done 08/27/19, will order STAT BMP on arrival Short Stay AM of procedure.

## 2019-08-29 NOTE — Telephone Encounter (Signed)
Pt contacted pre-catheterization scheduled at [facility] for: Thursday August 30, 2019 7:30 AM Verified arrival time and place: [facility] Main Entrance A [facility]) at: 5:30 AM   No solid food after midnight prior to cath, clear liquids until 5 AM day of procedure.  Hold: Eliquis-stop 08/27/19  until post procedure-per instructions  Except hold medications AM meds can be  taken pre-cath with sip of water including: ASA 81 mg   Confirmed patient has responsible adult to drive home post procedure and observe 24 hours after arriving home:   You are allowed ONE visitor in the waiting room during your procedure. Both you and your visitor must wear masks.      COVID-19 Pre-Screening Questions:   In the past 7 to 10 days have you had a cough,  shortness of breath, headache, congestion, fever (100 or greater) body aches, chills, sore throat, or sudden loss of taste or sense of smell?  Have you been around anyone with known Covid 19 in the past 7 to 10 days?  Have you been around anyone who is awaiting Covid 19 test results in the past 7 to 10 days?  Have you been around anyone who has mentioned symptoms of Covid 19 within the past 7 to 10 days?    LMTCB for patient to review procedure instructions through [facility] [address], [place] [id number].

## 2019-08-30 ENCOUNTER — Encounter (HOSPITAL_COMMUNITY): Payer: Self-pay | Admitting: Interventional Cardiology

## 2019-08-30 ENCOUNTER — Encounter (HOSPITAL_COMMUNITY)
Admission: RE | Disposition: A | Discharge status: Home / Self Care | Payer: Self-pay | Source: Home / Self Care | Attending: Interventional Cardiology

## 2019-08-30 ENCOUNTER — Ambulatory Visit (HOSPITAL_COMMUNITY)
Admission: RE | Admit: 2019-08-30 | Discharge: 2019-08-30 | Disposition: A | Discharge status: Home / Self Care | Payer: Medicaid Other | Attending: Interventional Cardiology | Admitting: Interventional Cardiology

## 2019-08-30 ENCOUNTER — Other Ambulatory Visit: Payer: Self-pay

## 2019-08-30 DIAGNOSIS — I110 Hypertensive heart disease with heart failure: Secondary | ICD-10-CM | POA: Insufficient documentation

## 2019-08-30 DIAGNOSIS — I428 Other cardiomyopathies: Secondary | ICD-10-CM | POA: Diagnosis not present

## 2019-08-30 DIAGNOSIS — I2510 Atherosclerotic heart disease of native coronary artery without angina pectoris: Secondary | ICD-10-CM | POA: Insufficient documentation

## 2019-08-30 DIAGNOSIS — Z87891 Personal history of nicotine dependence: Secondary | ICD-10-CM | POA: Diagnosis not present

## 2019-08-30 DIAGNOSIS — I5042 Chronic combined systolic (congestive) and diastolic (congestive) heart failure: Secondary | ICD-10-CM | POA: Diagnosis not present

## 2019-08-30 DIAGNOSIS — M25511 Pain in right shoulder: Secondary | ICD-10-CM | POA: Insufficient documentation

## 2019-08-30 DIAGNOSIS — I4891 Unspecified atrial fibrillation: Secondary | ICD-10-CM | POA: Diagnosis not present

## 2019-08-30 DIAGNOSIS — Z7901 Long term (current) use of anticoagulants: Secondary | ICD-10-CM | POA: Diagnosis not present

## 2019-08-30 DIAGNOSIS — Z79899 Other long term (current) drug therapy: Secondary | ICD-10-CM | POA: Diagnosis not present

## 2019-08-30 DIAGNOSIS — I700 Atherosclerosis of aorta: Secondary | ICD-10-CM | POA: Diagnosis not present

## 2019-08-30 DIAGNOSIS — R9439 Abnormal result of other cardiovascular function study: Secondary | ICD-10-CM

## 2019-08-30 HISTORY — PX: LEFT HEART CATH AND CORONARY ANGIOGRAPHY: CATH118249

## 2019-08-30 LAB — BASIC METABOLIC PANEL
Anion gap: 10 (ref 5–15)
BUN: 8 mg/dL (ref 6–20)
CO2: 24 mmol/L (ref 22–32)
Calcium: 9.2 mg/dL (ref 8.9–10.3)
Chloride: 106 mmol/L (ref 98–111)
Creatinine, Ser: 1.08 mg/dL (ref 0.61–1.24)
GFR calc Af Amer: >60 mL/min (ref >60)
GFR calc non Af Amer: >60 mL/min (ref >60)
Glucose, Bld: 104 mg/dL — ABNORMAL HIGH (ref 70–99)
Potassium: 4 mmol/L (ref 3.5–5.1)
Sodium: 140 mmol/L (ref 135–145)

## 2019-08-30 SURGERY — LEFT HEART CATH AND CORONARY ANGIOGRAPHY
Anesthesia: LOCAL

## 2019-08-30 MED ORDER — SODIUM CHLORIDE 0.9% FLUSH: 3.0000 mL | INTRAVENOUS | Status: DC | PRN

## 2019-08-30 MED ORDER — ONDANSETRON HCL 4 MG/2ML IJ SOLN: 4.0000 mg | Freq: Four times a day (QID) | INTRAMUSCULAR | Status: DC | PRN

## 2019-08-30 MED ORDER — HEPARIN SODIUM (PORCINE) 1000 UNIT/ML IJ SOLN
INTRAMUSCULAR | Status: AC
  Filled 2019-08-30: qty 1

## 2019-08-30 MED ORDER — ASPIRIN 81 MG PO CHEW: 81.0000 mg | CHEWABLE_TABLET | ORAL | Status: DC

## 2019-08-30 MED ORDER — HEPARIN (PORCINE) IN NACL 1000-0.9 UT/500ML-% IV SOLN
INTRAVENOUS | Status: AC
  Filled 2019-08-30: qty 1000

## 2019-08-30 MED ORDER — LABETALOL HCL 5 MG/ML IV SOLN: 10.0000 mg | INTRAVENOUS | Status: DC | PRN

## 2019-08-30 MED ORDER — VERAPAMIL HCL 2.5 MG/ML IV SOLN
INTRAVENOUS | Status: AC
  Filled 2019-08-30: qty 2

## 2019-08-30 MED ORDER — MIDAZOLAM HCL 2 MG/2ML IJ SOLN
INTRAMUSCULAR | Status: AC
  Filled 2019-08-30: qty 2

## 2019-08-30 MED ORDER — SODIUM CHLORIDE 0.9 % IV SOLN: 250.0000 mL | INTRAVENOUS | Status: DC | PRN

## 2019-08-30 MED ORDER — SODIUM CHLORIDE 0.9% FLUSH: 3.0000 mL | Freq: Two times a day (BID) | INTRAVENOUS | Status: DC

## 2019-08-30 MED ORDER — LIDOCAINE HCL (PF) 1 % IJ SOLN
INTRAMUSCULAR | Status: AC
  Filled 2019-08-30: qty 30

## 2019-08-30 MED ORDER — FENTANYL CITRATE (PF) 100 MCG/2ML IJ SOLN
INTRAMUSCULAR | Status: DC | PRN
  Administered 2019-08-30 (×2): 25 ug via INTRAVENOUS

## 2019-08-30 MED ORDER — SODIUM CHLORIDE 0.9 % WEIGHT BASED INFUSION
3.0000 mL/kg/h | INTRAVENOUS | Status: AC
  Administered 2019-08-30: 3 mL/kg/h via INTRAVENOUS

## 2019-08-30 MED ORDER — FENTANYL CITRATE (PF) 100 MCG/2ML IJ SOLN
INTRAMUSCULAR | Status: AC
  Filled 2019-08-30: qty 2

## 2019-08-30 MED ORDER — ACETAMINOPHEN 325 MG PO TABS: 650.0000 mg | ORAL_TABLET | ORAL | Status: DC | PRN

## 2019-08-30 MED ORDER — HYDRALAZINE HCL 20 MG/ML IJ SOLN: 10.0000 mg | INTRAMUSCULAR | Status: DC | PRN

## 2019-08-30 MED ORDER — IOHEXOL 350 MG/ML SOLN
INTRAVENOUS | Status: DC | PRN
  Administered 2019-08-30: 60 mL

## 2019-08-30 MED ORDER — APIXABAN 5 MG PO TABS: 5.0000 mg | ORAL_TABLET | Freq: Two times a day (BID) | ORAL | 2 refills | Status: DC

## 2019-08-30 MED ORDER — VERAPAMIL HCL 2.5 MG/ML IV SOLN
INTRAVENOUS | Status: DC | PRN
  Administered 2019-08-30: 10 mL via INTRA_ARTERIAL

## 2019-08-30 MED ORDER — HEPARIN (PORCINE) IN NACL 1000-0.9 UT/500ML-% IV SOLN
INTRAVENOUS | Status: DC | PRN
  Administered 2019-08-30 (×2): 500 mL

## 2019-08-30 MED ORDER — MIDAZOLAM HCL 2 MG/2ML IJ SOLN
INTRAMUSCULAR | Status: DC | PRN
  Administered 2019-08-30 (×2): 1 mg via INTRAVENOUS

## 2019-08-30 MED ORDER — SODIUM CHLORIDE 0.9 % WEIGHT BASED INFUSION: 1.0000 mL/kg/h | INTRAVENOUS | Status: DC

## 2019-08-30 MED ORDER — HEPARIN SODIUM (PORCINE) 1000 UNIT/ML IJ SOLN
INTRAMUSCULAR | Status: DC | PRN
  Administered 2019-08-30: 3500 [IU] via INTRAVENOUS

## 2019-08-30 MED ORDER — LIDOCAINE HCL (PF) 1 % IJ SOLN
INTRAMUSCULAR | Status: DC | PRN
  Administered 2019-08-30: 2 mL

## 2019-08-30 SURGICAL SUPPLY — 12 items
CATH 5FR JL3.5 JR4 ANG PIG MP (CATHETERS) ×2 IMPLANT
CATH INFINITI 4FR JL3.5 (CATHETERS) ×2 IMPLANT
DEVICE RAD COMP TR BAND LRG (VASCULAR PRODUCTS) ×2 IMPLANT
GLIDESHEATH SLEND SS 6F .021 (SHEATH) ×2 IMPLANT
GUIDEWIRE INQWIRE 1.5J.035X260 (WIRE) ×1 IMPLANT
INQWIRE 1.5J .035X260CM (WIRE) ×2
KIT HEART LEFT (KITS) ×2 IMPLANT
PACK CARDIAC CATHETERIZATION (CUSTOM PROCEDURE TRAY) ×2 IMPLANT
SHEATH PROBE COVER 6X72 (BAG) ×2 IMPLANT
TRANSDUCER W/STOPCOCK (MISCELLANEOUS) ×2 IMPLANT
TUBING CIL FLEX 10 FLL-RA (TUBING) ×2 IMPLANT
WIRE HI TORQ VERSACORE-J 145CM (WIRE) ×2 IMPLANT

## 2019-08-30 NOTE — Discharge Instructions (Signed)
Radial Site Care  This sheet gives you information about how to care for yourself after your procedure. Your health care provider may also give you more specific instructions. If you have problems or questions, contact your health care provider. What can I expect after the procedure? After the procedure, it is common to have:  Bruising and tenderness at the catheter insertion area. Follow these instructions at home: Medicines  Take over-the-counter and prescription medicines only as told by your health care provider. Insertion site care  Follow instructions from your health care provider about how to take care of your insertion site. Make sure you: ? Wash your hands with soap and water before you change your bandage (dressing). If soap and water are not available, use hand sanitizer. ? Change your dressing as told by your health care provider. ? Leave stitches (sutures), skin glue, or adhesive strips in place. These skin closures may need to stay in place for 2 weeks or longer. If adhesive strip edges start to loosen and curl up, you may trim the loose edges. Do not remove adhesive strips completely unless your health care provider tells you to do that.  Check your insertion site every day for signs of infection. Check for: ? Redness, swelling, or pain. ? Fluid or blood. ? Pus or a bad smell. ? Warmth.  Do not take baths, swim, or use a hot tub until your health care provider approves.  You may shower 24-48 hours after the procedure, or as directed by your health care provider. ? Remove the dressing and gently wash the site with plain soap and water. ? Pat the area dry with a clean towel. ? Do not rub the site. That could cause bleeding.  Do not apply powder or lotion to the site. Activity   For 24 hours after the procedure, or as directed by your health care provider: ? Do not flex or bend the affected arm. ? Do not push or pull heavy objects with the affected arm. ? Do not  drive yourself home from the hospital or clinic. You may drive 24 hours after the procedure unless your health care provider tells you not to. ? Do not operate machinery or power tools.  Do not lift anything that is heavier than 10 lb (4.5 kg), or the limit that you are told, until your health care provider says that it is safe.  Ask your health care provider when it is okay to: ? Return to work or school. ? Resume usual physical activities or sports. ? Resume sexual activity. General instructions  If the catheter site starts to bleed, raise your arm and put firm pressure on the site. If the bleeding does not stop, get help right away. This is a medical emergency.  If you went home on the same day as your procedure, a responsible adult should be with you for the first 24 hours after you arrive home.  Keep all follow-up visits as told by your health care provider. This is important. Contact a health care provider if:  You have a fever.  You have redness, swelling, or yellow drainage around your insertion site. Get help right away if:  You have unusual pain at the radial site.  The catheter insertion area swells very fast.  The insertion area is bleeding, and the bleeding does not stop when you hold steady pressure on the area.  Your arm or hand becomes pale, cool, tingly, or numb. These symptoms may represent a serious problem   that is an emergency. Do not wait to see if the symptoms will go away. Get medical help right away. Call your local emergency services (911 in the [place] Do not drive yourself to the hospital. Summary  After the procedure, it is common to have bruising and tenderness at the site.  Follow instructions from your health care provider about how to take care of your radial site wound. Check the wound every day for signs of infection.  Do not lift anything that is heavier than 10 lb (4.5 kg), or the limit that you are told, until your health care provider says  that it is safe. This information is not intended to replace advice given to you by your health care provider. Make sure you discuss any questions you have with your health care provider. Document Revised: 04/13/2017 Document Reviewed: 04/13/2017 Elsevier Patient Education  2020 [employer] Inc.  

## 2019-08-30 NOTE — Interval H&P Note (Signed)
Cath Lab Visit (complete for each Cath Lab visit)  Clinical Evaluation Leading to the Procedure:   ACS: No.  Non-ACS:    Anginal Classification: CCS II  Anti-ischemic medical therapy: Minimal Therapy (1 class of medications)  Non-Invasive Test Results: High-risk stress test findings: cardiac mortality >3%/year  Prior CABG: No previous CABG   Low EF by echo   History and Physical Interval Note:  08/30/2019 8:30 AM  Seth Snyder  has presented today for surgery, with the diagnosis of abnormal stress test.  The various methods of treatment have been discussed with the patient and family. After consideration of risks, benefits and other options for treatment, the patient has consented to  Procedure(s): LEFT HEART CATH AND CORONARY ANGIOGRAPHY (N/A) as a surgical intervention.  The patient's history has been reviewed, patient examined, no change in status, stable for surgery.  I have reviewed the patient's chart and labs.  Questions were answered to the patient's satisfaction.     Lance Muss

## 2019-08-30 NOTE — Telephone Encounter (Signed)
Pt at hospital for procedure. °

## 2019-09-07 ENCOUNTER — Other Ambulatory Visit: Payer: Self-pay

## 2019-09-07 ENCOUNTER — Ambulatory Visit (INDEPENDENT_AMBULATORY_CARE_PROVIDER_SITE_OTHER): Payer: Medicaid Other | Admitting: Physician Assistant

## 2019-09-07 ENCOUNTER — Encounter: Payer: Self-pay | Admitting: Physician Assistant

## 2019-09-07 VITALS — BP 116/90 | HR 67 | Ht 67.5 in | Wt 157.8 lb

## 2019-09-07 DIAGNOSIS — I4819 Other persistent atrial fibrillation: Secondary | ICD-10-CM

## 2019-09-07 DIAGNOSIS — I428 Other cardiomyopathies: Secondary | ICD-10-CM | POA: Diagnosis not present

## 2019-09-07 DIAGNOSIS — I5042 Chronic combined systolic (congestive) and diastolic (congestive) heart failure: Secondary | ICD-10-CM | POA: Diagnosis not present

## 2019-09-07 DIAGNOSIS — I10 Essential (primary) hypertension: Secondary | ICD-10-CM

## 2019-09-07 NOTE — Progress Notes (Signed)
Cardiology Office Note:    Date:  09/09/2019   ID:  Seth Snyder, Seth Snyder [birth year], MRN [id number]  PCP:  Barbette Merino, NP  [facility] Cardiologist:  Chilton Si, MD  [facility] Electrophysiologist:  None   Referring MD: Barbette Merino, NP   Chief Complaint  Patient presents with  . Hospitalization Follow-up    seen for Dr. Duke Salvia    History of Present Illness:    Seth Snyder is a 59 y.o. male with a hx of atrial fibrillation, hypertension, and combined systolic and diastolic heart failure.  He was first diagnosed with atrial fibrillation/atrial flutter in [facility] in November 2020.  He was admitted in November 2020 to [facility] for hypertensive urgency and A. Fib.  He was started on Eliquis, aspirin and metoprolol ER 100 mg daily.  After he moved to [place], he was admitted to the hospital in February 2021 with A. fib with RVR.  Echocardiogram at the time showed EF 45 to 50%, indeterminate diastolic function, mild MR, mild AI, mild to moderate TR.  He underwent TEE-DCCVx2 on 05/10/2019.  EF was 30 to 35% on TEE.  Metoprolol was increased to 150 mg daily. Thyroid function was mildly abnormal, therefore amiodarone was avoided.  Myoview obtained on 05/23/2019 showed fixed defect at the apex however reversible inferior defect suggest ischemia.  Cardiac catheterization was recommended.  By the time patient was seen by Edd Fabian on 08/27/2019, EKG shows he has went back into atrial fibrillation. Cardiac catheterization performed on 08/30/2019 showed EF less than 25% by visual estimate, LVEDP 12 mmHg, no significant CAD or aortic valve stenosis.  Procedure was complicated by severe right radial artery vasospasm, it was recommended not to approach via the right radial artery in the future if cardiac catheterization is considered.  Patient presents today for cardiology follow-up after cardiac catheterization.  He has several complaints including  pain shooting down from the right shoulder down to the right arm.  On exam, his right radial cath site was stable.  The shooting pain is likely related to his neck issue.  He says he was seen by orthopedic doctor who recommended surgery, I asked him to let his doctor know to send [place] a formal cardiac clearance request.  He also complained of open ulcer on his body, they look like bursted tiny abscess.  This can be addressed by primary care provider.  Previous HIV screening test in February was negative.  He complained of right hip pain, recent cardiac catheterization was done via radial artery only, right hip pain worse was movement of the hip and the leg, this is likely musculoskeletal in nature.  I recommended him to discuss this with his orthopedic doctor.  His final complaint was dyspnea, I suspect his dyspnea is related to recent LV dysfunction and also possibly A. fib as well.  Given his young age and the significant drop in the ejection fraction, I think we need to to consider additional cardioversion in the future to get him back to normal rhythm.  However since he had a recurrence of A. fib shortly after the last cardioversion, I recommended refer him to the A. fib clinic to consider antiarrhythmic therapy.  Given his dilated cardiomyopathy, he would not be a good candidate for class Ic drug.  His TSH was 12.77 in February, however this improved to 3.9 in May.  Therefore, amiodarone was avoided.  Only antiarrhythmic option may be Tikosyn in this case.  I think  once he is able to restore sinus rhythm, he has a better chance of improving ejection fraction.  I did not add any ACE inhibitor or ARB today as his blood pressure is borderline on the 200 mg daily of Toprol-XL.  Primary goal in this case is to restore sinus rhythm which likely has the biggest impact on improving his ejection fraction.  Past Medical History:  Diagnosis Date  . Anemia   . Atrial fibrillation (HCC)   . Hypertension   . Low back  pain     Past Surgical History:  Procedure Laterality Date  . CARDIOVERSION N/A 05/10/2019   Procedure: CARDIOVERSION;  Surgeon: Lewayne Bunting, MD;  Location: [facility] ENDOSCOPY;  Service: Cardiovascular;  Laterality: N/A;  . COLONOSCOPY  07/12/2013  . LEFT HEART CATH AND CORONARY ANGIOGRAPHY N/A 08/30/2019   Procedure: LEFT HEART CATH AND CORONARY ANGIOGRAPHY;  Surgeon: Corky Crafts, MD;  Location: [facility] INVASIVE CV LAB;  Service: Cardiovascular;  Laterality: N/A;  . TEE WITHOUT CARDIOVERSION N/A 05/10/2019   Procedure: TRANSESOPHAGEAL ECHOCARDIOGRAM (TEE);  Surgeon: Lewayne Bunting, MD;  Location: [facility] ENDOSCOPY;  Service: Cardiovascular;  Laterality: N/A;    Current Medications: Current Meds  Medication Sig  . acetaminophen (TYLENOL) 500 MG tablet Take 500 mg by mouth daily as needed for mild pain.  Marland Kitchen apixaban (ELIQUIS) 5 MG TABS tablet Take 1 tablet (5 mg total) by mouth 2 (two) times daily.  Marland Kitchen atorvastatin (LIPITOR) 40 MG tablet Take 1 tablet (40 mg total) by mouth daily at 6 PM. (Patient taking differently: Take 40 mg by mouth in the morning and at bedtime. )  . gabapentin (NEURONTIN) 300 MG capsule TAKE 1 CAPSULE BY MOUTH EVERYDAY AT BEDTIME (Patient taking differently: Take 300 mg by mouth 2 (two) times daily. )  . metoprolol succinate (TOPROL-XL) 100 MG 24 hr tablet Take 2 tablets (200 mg total) by mouth daily. Take with or immediately following a meal.     Allergies:   Patient has no known allergies.   Social History   Socioeconomic History  . Marital status: Single    Spouse name: Not on file  . Number of children: Not on file  . Years of education: Not on file  . Highest education level: Not on file  Occupational History  . Not on file  Tobacco Use  . Smoking status: Former Smoker    Quit date: 12/21/2018    Years since quitting: 0.7  . Smokeless tobacco: Never Used  Substance and Sexual Activity  . Alcohol use: Not Currently  . Drug use: Not on file  . Sexual  activity: Not on file  Other Topics Concern  . Not on file  Social History Narrative  . Not on file   Social Determinants of Health   Financial Resource Strain:   . Difficulty of Paying Living Expenses:   Food Insecurity:   . Worried About Programme researcher, broadcasting/film/video in the Last Year:   . Barista in the Last Year:   Transportation Needs:   . Freight forwarder (Medical):   Marland Kitchen Lack of Transportation (Non-Medical):   Physical Activity:   . Days of Exercise per Week:   . Minutes of Exercise per Session:   Stress:   . Feeling of Stress :   Social Connections:   . Frequency of Communication with Friends and Family:   . Frequency of Social Gatherings with Friends and Family:   . Attends Religious Services:   . Active  Member of Clubs or Organizations:   . Attends Archivist Meetings:   Marland Kitchen Marital Status:      Family History: The patient's family history includes Diabetes in his mother and sister; Mental illness in his brother and sister.  ROS:   Please see the history of present illness.     All other systems reviewed and are negative.  EKGs/Labs/Other Studies Reviewed:    The following studies were reviewed today:  TEE 05/10/2019 1. Moderate to severe global reduction in LV systolic function; mild AI  and MR; mild LAE; spontaneous contrast in LAA but no thrombus; mild RAE  and RVE; moderate TR.  2. Left ventricular ejection fraction, by estimation, is 30 to 35%. The  left ventricle has moderately decreased function. The left ventricle  demonstrates global hypokinesis. Left ventricular diastolic function could  not be evaluated.  3. Right ventricular systolic function is normal. The right ventricular  size is mildly enlarged.  4. Left atrial size was mildly dilated. No left atrial/left atrial  appendage thrombus was detected.  5. Right atrial size was mildly dilated.  6. The mitral valve is normal in structure and function. Mild mitral  valve  regurgitation.  7. Tricuspid valve regurgitation is moderate.  8. The aortic valve is tricuspid. Aortic valve regurgitation is mild.  9. There is mild (Grade II) plaque involving the descending aorta.   Cath 08/30/2019  There is severe left ventricular systolic dysfunction.  The left ventricular ejection fraction is less than 25% by visual estimate.  LV end diastolic pressure is normal. LVEDP 12 mm Hg.  There is no aortic valve stenosis.  No significant CAD.  Severe vasospasm of right radial artery, even with 4 Fr catheter. Would not use the right radial artery if cath was needed in the future.   Medical therapy for nonischemic cardiomyopathy.   Resume Eliquis tomorrow morning.    EKG:  EKG is not ordered today.    Recent Labs: 05/07/2019: B Natriuretic Peptide 733.8 05/08/2019: ALT 33 05/09/2019: Magnesium 1.8 08/15/2019: TSH 3.960 08/27/2019: Hemoglobin 12.1; Platelets 227 08/30/2019: BUN 8; Creatinine, [place] 1.08; Potassium 4.0; Sodium 140  Recent Lipid Panel    Component Value Date/Time   CHOL 94 (L) 08/15/2019 0925   TRIG 93 08/15/2019 0925   HDL 41 08/15/2019 0925   CHOLHDL 2.3 08/15/2019 0925   CHOLHDL 2.8 05/07/2019 0823   VLDL 17 05/07/2019 0823   LDLCALC 35 08/15/2019 0925    Physical Exam:    VS:  BP 116/90   Pulse 67   Ht 5' 7.5" (1.715 m)   Wt 157 lb 12.8 oz (71.6 kg)   SpO2 95%   BMI 24.35 kg/m     Wt Readings from Last 3 Encounters:  09/07/19 157 lb 12.8 oz (71.6 kg)  08/30/19 161 lb (73 kg)  08/27/19 163 lb 3.2 oz (74 kg)     GEN:  Well nourished, well developed in no acute distress HEENT: Normal NECK: No JVD; No carotid bruits LYMPHATICS: No lymphadenopathy CARDIAC: Irregularly irregular, no murmurs, rubs, gallops RESPIRATORY:  Clear to auscultation without rales, wheezing or rhonchi  ABDOMEN: Soft, non-tender, non-distended MUSCULOSKELETAL:  No edema; No deformity  SKIN: Warm and dry NEUROLOGIC:  Alert and oriented x 3 PSYCHIATRIC:   Normal affect   ASSESSMENT:    1. Chronic combined systolic and diastolic heart failure ([place])   2. NICM (nonischemic cardiomyopathy) ([place])   3. Persistent atrial fibrillation ([place])   4. Essential hypertension  PLAN:    In order of problems listed above:  1. Chronic combined systolic and diastolic heart failure: Appears to be euvolemic on current therapy, he is not on any Lasix.  2. Nonischemic cardiomyopathy: Previous TEE in February showed ejection fraction 30 to 35%.  Recent cardiac catheterization showed no significant CAD, therefore I suspect his low ejection fraction likely is related to recurrent atrial fibrillation.  Primary goal in this case is to keep him in sinus rhythm.  He one of his major complaint today is shortness of breath which I suspect is his main symptom associated with A. Fib  3. Recurrent atrial fibrillation: He was noted to be back in atrial fibrillation during recent visit by Edd Fabian NP.  On today's exam, his heart rate is still very irregular.  I recommended referral to A. fib clinic to consider the best way to restore sinus rhythm.  He is currently on a 200 mg daily of metoprolol succinate.  Further increase metoprolol likely will not see much benefit.  Given his dilated cardiomyopathy, class Ic drug is not ideal either.  He previously had elevated TSH, therefore amiodarone was avoided.  Only option would be either sotalol versus Tikosyn.  Continue Eliquis.  4. Hypertension: Blood pressure stable   Medication Adjustments/Labs and Tests Ordered: Current medicines are reviewed at length with the patient today.  Concerns regarding medicines are outlined above.  No orders of the defined types were placed in this encounter.  No orders of the defined types were placed in this encounter.   Patient Instructions  Medication Instructions:  No Changes *If you need a refill on your cardiac medications before your next appointment, please call your  pharmacy*   Lab Work: None If you have labs (blood work) drawn today and your tests are completely normal, you will receive your results only by: Marland Kitchen MyChart Message (if you have MyChart) OR . A paper copy in the mail If you have any lab test that is abnormal or we need to change your treatment, we will call you to review the results.   Testing/Procedures: None   Follow-Up: At [facility], you and your health needs are our priority.  As part of our continuing mission to provide you with exceptional heart care, we have created designated Provider Care Teams.  These Care Teams include your primary Cardiologist (physician) and Advanced Practice Providers (APPs -  Physician Assistants and Nurse Practitioners) who all work together to provide you with the care you need, when you need it.  We recommend signing up for the patient portal called "MyChart".  Sign up information is provided on this After Visit Summary.  MyChart is used to connect with patients for Virtual Visits (Telemedicine).  Patients are able to view lab/test results, encounter notes, upcoming appointments, etc.  Non-urgent messages can be sent to your provider as well.   To learn more about what you can do with MyChart, go to [email or web address].    Your next appointment:   3 month(s)  The format for your next appointment:   In Person  Provider:   Chilton Si, MD   Other Instructions Referral to A-Fib clinic in 1-2 weeks.  Refer to PCP for wound. Orthopedist to fax Cardiac clearance to [facility] [phone]      Signed, Azalee Course, [place]  09/09/2019 11:38 PM    [facility]

## 2019-09-07 NOTE — Patient Instructions (Signed)
Medication Instructions:  No Changes *If you need a refill on your cardiac medications before your next appointment, please call your pharmacy*   Lab Work: None If you have labs (blood work) drawn today and your tests are completely normal, you will receive your results only by: Marland Kitchen MyChart Message (if you have MyChart) OR . A paper copy in the mail If you have any lab test that is abnormal or we need to change your treatment, we will call you to review the results.   Testing/Procedures: None   Follow-Up: At [facility], you and your health needs are our priority.  As part of our continuing mission to provide you with exceptional heart care, we have created designated Provider Care Teams.  These Care Teams include your primary Cardiologist (physician) and Advanced Practice Providers (APPs -  Physician Assistants and Nurse Practitioners) who all work together to provide you with the care you need, when you need it.  We recommend signing up for the patient portal called "MyChart".  Sign up information is provided on this After Visit Summary.  MyChart is used to connect with patients for Virtual Visits (Telemedicine).  Patients are able to view lab/test results, encounter notes, upcoming appointments, etc.  Non-urgent messages can be sent to your provider as well.   To learn more about what you can do with MyChart, go to [email or web address].    Your next appointment:   3 month(s)  The format for your next appointment:   In Person  Provider:   Chilton Si, MD   Other Instructions Referral to A-Fib clinic in 1-2 weeks.  Refer to PCP for wound. Orthopedist to fax Cardiac clearance to [facility] [phone]

## 2019-09-09 ENCOUNTER — Encounter: Payer: Self-pay | Admitting: Physician Assistant

## 2019-09-10 ENCOUNTER — Ambulatory Visit (INDEPENDENT_AMBULATORY_CARE_PROVIDER_SITE_OTHER): Payer: Medicaid Other | Admitting: Nurse Practitioner

## 2019-09-10 ENCOUNTER — Other Ambulatory Visit: Payer: Self-pay

## 2019-09-10 ENCOUNTER — Encounter: Payer: Self-pay | Admitting: Nurse Practitioner

## 2019-09-10 VITALS — BP 146/94 | HR 93 | Temp 98.1°F | Ht 67.5 in | Wt 155.6 lb

## 2019-09-10 DIAGNOSIS — L97208 Non-pressure chronic ulcer of unspecified calf with other specified severity: Secondary | ICD-10-CM

## 2019-09-10 DIAGNOSIS — L299 Pruritus, unspecified: Secondary | ICD-10-CM

## 2019-09-10 DIAGNOSIS — I4891 Unspecified atrial fibrillation: Secondary | ICD-10-CM

## 2019-09-10 DIAGNOSIS — I5042 Chronic combined systolic (congestive) and diastolic (congestive) heart failure: Secondary | ICD-10-CM

## 2019-09-10 DIAGNOSIS — Z789 Other specified health status: Secondary | ICD-10-CM

## 2019-09-10 DIAGNOSIS — M4802 Spinal stenosis, cervical region: Secondary | ICD-10-CM | POA: Diagnosis not present

## 2019-09-10 DIAGNOSIS — I83002 Varicose veins of unspecified lower extremity with ulcer of calf: Secondary | ICD-10-CM

## 2019-09-10 DIAGNOSIS — I10 Essential (primary) hypertension: Secondary | ICD-10-CM | POA: Diagnosis not present

## 2019-09-10 MED ORDER — HYDROCORTISONE 1 % EX LOTN
1.0000 | TOPICAL_LOTION | Freq: Two times a day (BID) | CUTANEOUS | 0 refills | Status: DC
Start: 2019-09-10 — End: 2019-10-22

## 2019-09-10 MED FILL — ELIQUIS 5 MG TABLET: 5 | 30 days supply | Qty: 60 | Fill #2

## 2019-09-10 NOTE — Patient Instructions (Addendum)
Atrial Fibrillation  Atrial fibrillation is a type of heartbeat that is irregular or fast. If you have this condition, your heart beats without any order. This makes it hard for your heart to pump blood in a normal way. Atrial fibrillation may come and go, or it may become a long-lasting problem. If this condition is not treated, it can put you at higher risk for stroke, heart failure, and other heart problems. What are the causes? This condition may be caused by diseases that damage the heart. They include:  High blood pressure.  Heart failure.  Heart valve disease.  Heart surgery. Other causes include:  Diabetes.  Thyroid disease.  Being overweight.  Kidney disease. Sometimes the cause is not known. What increases the risk? You are more likely to develop this condition if:  You are older.  You smoke.  You exercise often and very hard.  You have a family history of this condition.  You are a man.  You use drugs.  You drink a lot of alcohol.  You have lung conditions, such as emphysema, pneumonia, or COPD.  You have sleep apnea. What are the signs or symptoms? Common symptoms of this condition include:  A feeling that your heart is beating very fast.  Chest pain or discomfort.  Feeling short of breath.  Suddenly feeling light-headed or weak.  Getting tired easily during activity.  Fainting.  Sweating. In some cases, there are no symptoms. How is this treated? Treatment for this condition depends on underlying conditions and how you feel when you have atrial fibrillation. They include:  Medicines to: ? Prevent blood clots. ? Treat heart rate or heart rhythm problems.  Using devices, such as a pacemaker, to correct heart rhythm problems.  Doing surgery to remove the part of the heart that sends bad signals.  Closing an area where clots can form in the heart (left atrial appendage). In some cases, your doctor will treat other underlying  conditions. Follow these instructions at home: Medicines  Take over-the-counter and prescription medicines only as told by your doctor.  Do not take any new medicines without first talking to your doctor.  If you are taking blood thinners: ? Talk with your doctor before you take any medicines that have aspirin or NSAIDs, such as ibuprofen, in them. ? Take your medicine exactly as told by your doctor. Take it at the same time each day. ? Avoid activities that could hurt or bruise you. Follow instructions about how to prevent falls. ? Wear a bracelet that says you are taking blood thinners. Or, carry a card that lists what medicines you take. Lifestyle      Do not use any products that have nicotine or tobacco in them. These include cigarettes, e-cigarettes, and chewing tobacco. If you need help quitting, ask your doctor.  Eat heart-healthy foods. Talk with your doctor about the right eating plan for you.  Exercise regularly as told by your doctor.  Do not drink alcohol.  Lose weight if you are overweight.  Do not use drugs, including cannabis. General instructions  If you have a condition that causes breathing to stop for a short period of time (apnea), treat it as told by your doctor.  Keep a healthy weight. Do not use diet pills unless your doctor says they are safe for you. Diet pills may make heart problems worse.  Keep all follow-up visits as told by your doctor. This is important. Contact a doctor if:  You notice a change   in the speed, rhythm, or strength of your heartbeat.  You are taking a blood-thinning medicine and you get more bruising.  You get tired more easily when you move or exercise.  You have a sudden change in weight. Get help right away if:   You have pain in your chest or your belly (abdomen).  You have trouble breathing.  You have side effects of blood thinners, such as blood in your vomit, poop (stool), or pee (urine), or bleeding that cannot  stop.  You have any signs of a stroke. "BE FAST" is an easy way to remember the main warning signs: ? B - Balance. Signs are dizziness, sudden trouble walking, or loss of balance. ? E - Eyes. Signs are trouble seeing or a change in how you see. ? F - Face. Signs are sudden weakness or loss of feeling in the face, or the face or eyelid drooping on one side. ? A - Arms. Signs are weakness or loss of feeling in an arm. This happens suddenly and usually on one side of the body. ? S - Speech. Signs are sudden trouble speaking, slurred speech, or trouble understanding what people say. ? T - Time. Time to call emergency services. Write down what time symptoms started.  You have other signs of a stroke, such as: ? A sudden, very bad headache with no known cause. ? Feeling like you may vomit (nausea). ? Vomiting. ? A seizure. These symptoms may be an emergency. Do not wait to see if the symptoms will go away. Get medical help right away. Call your local emergency services (911 in the [place] Do not drive yourself to the hospital. Summary  Atrial fibrillation is a type of heartbeat that is irregular or fast.  You are at higher risk of this condition if you smoke, are older, have diabetes, or are overweight.  Follow your doctor's instructions about medicines, diet, exercise, and follow-up visits.  Get help right away if you have signs or symptoms of a stroke.  Get help right away if you cannot catch your breath, or you have chest pain or discomfort. This information is not intended to replace advice given to you by your health care provider. Make sure you discuss any questions you have with your health care provider. Document Revised: 08/30/2018 Document Reviewed: 08/30/2018 Elsevier Patient Education  2020 Elsevier Inc.   Heart Failure, Diagnosis  Heart failure means that your heart is not able to pump blood in the right way. This makes it hard for your body to work well. Heart failure is  usually a long-term (chronic) condition. You must take good care of yourself and follow your treatment plan from your doctor. What are the causes? This condition may be caused by:  High blood pressure.  Build up of cholesterol and fat in the arteries.  Heart attack. This injures the heart muscle.  Heart valves that do not open and close properly.  Damage of the heart muscle. This is also called cardiomyopathy.  Lung disease.  Abnormal heart rhythms. What increases the risk? The risk of heart failure goes up as a person ages. This condition is also more likely to develop in people who:  Are overweight.  Are male.  Smoke or chew tobacco.  Abuse alcohol or illegal drugs.  Have taken medicines that can damage the heart.  Have diabetes.  Have abnormal heart rhythms.  Have thyroid problems.  Have low blood counts (anemia). What are the signs or symptoms? Symptoms of   this condition include:  Shortness of breath.  Coughing.  Swelling of the feet, ankles, legs, or belly.  Losing weight for no reason.  Trouble breathing.  Waking from sleep because of the need to sit up and get more air.  Rapid heartbeat.  Being very tired.  Feeling dizzy, or feeling like you may pass out (faint).  Having no desire to eat.  Feeling like you may vomit (nauseous).  Peeing (urinating) more at night.  Feeling confused. How is this treated?     This condition may be treated with:  Medicines. These can be given to treat blood pressure and to make the heart muscles stronger.  Changes in your daily life. These may include eating a healthy diet, staying at a healthy body weight, quitting tobacco and illegal drug use, or doing exercises.  Surgery. Surgery can be done to open blocked valves, or to put devices in the heart, such as pacemakers.  A donor heart (heart transplant). You will receive a healthy heart from a donor. Follow these instructions at home:  Treat other  conditions as told by your doctor. These may include high blood pressure, diabetes, thyroid disease, or abnormal heart rhythms.  Learn as much as you can about heart failure.  Get support as you need it.  Keep all follow-up visits as told by your doctor. This is important. Summary  Heart failure means that your heart is not able to pump blood in the right way.  This condition is caused by high blood pressure, heart attack, or damage of the heart muscle.  Symptoms of this condition include shortness of breath and swelling of the feet, ankles, legs, or belly. You may also feel very tired or feel like you may vomit.  You may be treated with medicines, surgery, or changes in your daily life.  Treat other health conditions as told by your doctor. This information is not intended to replace advice given to you by your health care provider. Make sure you discuss any questions you have with your health care provider. Document Revised: 05/26/2018 Document Reviewed: 05/26/2018 Elsevier Patient Education  2020 Elsevier Inc.  

## 2019-09-10 NOTE — Progress Notes (Addendum)
[facility] Patient [facility] [address] [place], [place]  [address] Phone:  [phone]   Fax:  [phone]   Established Patient Office Visit  Subjective:  Patient ID: Seth [facility], male    DOB: 1961-02-02  Age: 59 y.o. MRN: [id number]  CC:  Chief Complaint  Patient presents with  . Follow-up     3 month follow up , back pain  , pain in goin going to leg, ,itching pain     HPI Seth Snyder presents for follow up. He  has a past medical history of Anemia, Atrial fibrillation (HCC), Hypertension, and Low back pain.   Hypertension Patient is here for follow-up of elevated blood pressure. He is not exercising and is adherent to a low-salt diet. Blood pressure is not monitor at home. Cardiac symptoms: chest pain, dyspnea, irregular heart beat and lower extremity edema. Patient denies fatigue and syncope. Cardiovascular risk factors: male gender and sedentary lifestyle. Use of agents associated with hypertension: none. History of target organ damage: heart failure and left ventricular hypertrophy. He has recently followed up with cardiology and is SP cardiac catheter. His est LV EF is 25-35%.  Skin Ulcer Patient complains of a skin ulcer. The ulcer is located on the chest. Ulcer has been present for 3 weeks. Pain is rated as 0/10. Denies fever, chills, active drainage, redness or swelling. Interventions to date: none. Patient denies tobacco use. Patient denies having a history of diabetes.  He describes it as started out as an ulcer.        Past Medical History:  Diagnosis Date  . Anemia   . Atrial fibrillation (HCC)   . Hypertension   . Low back pain     Past Surgical History:  Procedure Laterality Date  . CARDIOVERSION N/A 05/10/2019   Procedure: CARDIOVERSION;  Surgeon: Lewayne Bunting, MD;  Location: [facility] ENDOSCOPY;  Service: Cardiovascular;  Laterality: N/A;  . COLONOSCOPY  07/12/2013  . LEFT HEART CATH AND CORONARY ANGIOGRAPHY N/A 08/30/2019   Procedure:  LEFT HEART CATH AND CORONARY ANGIOGRAPHY;  Surgeon: Corky Crafts, MD;  Location: [facility] INVASIVE CV LAB;  Service: Cardiovascular;  Laterality: N/A;  . TEE WITHOUT CARDIOVERSION N/A 05/10/2019   Procedure: TRANSESOPHAGEAL ECHOCARDIOGRAM (TEE);  Surgeon: Lewayne Bunting, MD;  Location: [facility] ENDOSCOPY;  Service: Cardiovascular;  Laterality: N/A;    Family History  Problem Relation Age of Onset  . Diabetes Mother   . Diabetes Sister   . Mental illness Sister   . Mental illness Brother     Social History   Socioeconomic History  . Marital status: Single    Spouse name: Not on file  . Number of children: Not on file  . Years of education: Not on file  . Highest education level: Not on file  Occupational History  . Not on file  Tobacco Use  . Smoking status: Former Smoker    Quit date: 12/21/2018    Years since quitting: 0.7  . Smokeless tobacco: Never Used  Substance and Sexual Activity  . Alcohol use: Not Currently  . Drug use: Not on file  . Sexual activity: Not on file  Other Topics Concern  . Not on file  Social History Narrative  . Not on file   Social Determinants of Health   Financial Resource Strain:   . Difficulty of Paying Living Expenses:   Food Insecurity:   . Worried About Programme researcher, broadcasting/film/video in the Last Year:   .  Ran Out of Food in the Last Year:   Transportation Needs:   . Film/video editor (Medical):   Marland Kitchen Lack of Transportation (Non-Medical):   Physical Activity:   . Days of Exercise per Week:   . Minutes of Exercise per Session:   Stress:   . Feeling of Stress :   Social Connections:   . Frequency of Communication with Friends and Family:   . Frequency of Social Gatherings with Friends and Family:   . Attends Religious Services:   . Active Member of Clubs or Organizations:   . Attends Archivist Meetings:   Marland Kitchen Marital Status:   Intimate Partner Violence:   . Fear of Current or Ex-Partner:   . Emotionally Abused:   Marland Kitchen Physically  Abused:   . Sexually Abused:     Outpatient Medications Prior to Visit  Medication Sig Dispense Refill  . acetaminophen (TYLENOL) 500 MG tablet Take 500 mg by mouth daily as needed for mild pain.    Marland Kitchen apixaban (ELIQUIS) 5 MG TABS tablet Take 1 tablet (5 mg total) by mouth 2 (two) times daily. 60 tablet 2  . atorvastatin (LIPITOR) 40 MG tablet Take 1 tablet (40 mg total) by mouth daily at 6 PM. (Patient taking differently: Take 40 mg by mouth in the morning and at bedtime. ) 90 tablet 3  . gabapentin (NEURONTIN) 300 MG capsule TAKE 1 CAPSULE BY MOUTH EVERYDAY AT BEDTIME (Patient taking differently: Take 300 mg by mouth 2 (two) times daily. ) 30 capsule 1  . metoprolol succinate (TOPROL-XL) 100 MG 24 hr tablet Take 2 tablets (200 mg total) by mouth daily. Take with or immediately following a meal. 90 tablet 7   No facility-administered medications prior to visit.    No Known Allergies  ROS Review of Systems  Musculoskeletal: Positive for back pain.       History of back pain with interventional therapy      Objective:    Physical Exam Constitutional:      General: He is not in acute distress.    Appearance: He is normal weight. He is not ill-appearing or toxic-appearing.  HENT:     Head: Normocephalic.     Mouth/Throat:     Mouth: Mucous membranes are moist.  Cardiovascular:     Rate and Rhythm: Normal rate. Rhythm irregular.     Pulses: Normal pulses.     Heart sounds: Normal heart sounds.  Pulmonary:     Effort: Pulmonary effort is normal.     Breath sounds: Normal breath sounds.  Abdominal:     Palpations: Abdomen is soft.  Musculoskeletal:        General: Tenderness present.     Cervical back: Normal range of motion.     Comments: Right neck Stiffness  Skin:    General: Skin is warm and dry.     Capillary Refill: Capillary refill takes 2 to 3 seconds.          Comments: Varicose veins greater on left lower extremity Slight discoloration to bilateral lower  extremities resembles mottling  Neurological:     General: No focal deficit present.     Mental Status: He is alert and oriented to person, place, and time.  Psychiatric:        Mood and Affect: Mood normal.        Behavior: Behavior normal.        Thought Content: Thought content normal.        Judgment:  Judgment normal.     BP (!) 146/94 (BP Location: Right Arm, Patient Position: Sitting)   Pulse 93   Temp 98.1 F (36.7 C) (Temporal)   Ht 5' 7.5" (1.715 m)   Wt 155 lb 9.6 oz (70.6 kg)   SpO2 98%   BMI 24.01 kg/m  Wt Readings from Last 3 Encounters:  09/10/19 155 lb 9.6 oz (70.6 kg)  09/07/19 157 lb 12.8 oz (71.6 kg)  08/30/19 161 lb (73 kg)     Health Maintenance Due  Topic Date Due  . Hepatitis C Screening  Never done  . COLONOSCOPY  Never done    There are no preventive care reminders to display for this patient.  Lab Results  Component Value Date   TSH 3.960 08/15/2019   Lab Results  Component Value Date   WBC 7.3 08/27/2019   HGB 12.1 (L) 08/27/2019   HCT 36.7 (L) 08/27/2019   MCV 84 08/27/2019   PLT 227 08/27/2019   Lab Results  Component Value Date   NA 140 08/30/2019   K 4.0 08/30/2019   CO2 24 08/30/2019   GLUCOSE 104 (H) 08/30/2019   BUN 8 08/30/2019   CREATININE 1.08 08/30/2019   BILITOT 0.8 08/15/2019   ALKPHOS 96 08/15/2019   AST 28 08/15/2019   ALT 33 05/08/2019   PROT 6.9 08/15/2019   ALBUMIN 4.4 08/15/2019   CALCIUM 9.2 08/30/2019   ANIONGAP 10 08/30/2019   Lab Results  Component Value Date   CHOL 94 (L) 08/15/2019   Lab Results  Component Value Date   HDL 41 08/15/2019   Lab Results  Component Value Date   LDLCALC 35 08/15/2019   Lab Results  Component Value Date   TRIG 93 08/15/2019   Lab Results  Component Value Date   CHOLHDL 2.3 08/15/2019   No results found for: HGBA1C    Assessment & Plan:   Problem List Items Addressed This Visit      Cardiovascular and Mediastinum   Chronic combined systolic and  diastolic heart failure (HCC) Follow-up with cardiology   Atrial fibrillation with RVR (HCC) Follow-up with A. 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 hydrocortisone 1 % lotion    Sig: Apply 1 application topically 2 (two) times daily.    Dispense:  118 mL    Refill:  0    Order Specific Question:   Supervising Provider    Answer:   Quentin Angst [[id number]]    Follow-up: Return in about 3 months (around 12/11/2019) for please change the 8/21 apt.    Barbette Merino, NP

## 2019-09-13 ENCOUNTER — Ambulatory Visit: Payer: Self-pay | Admitting: General Practice

## 2019-09-13 ENCOUNTER — Ambulatory Visit: Payer: Self-pay | Admitting: Nurse Practitioner

## 2019-09-13 ENCOUNTER — Telehealth: Payer: Self-pay | Admitting: Orthopaedic Surgery

## 2019-09-13 NOTE — Telephone Encounter (Signed)
[facility] called in regards to the patient's clearance.  They are requesting that the patient's surgery be postponed until his AFIB is under control.  Thank you.

## 2019-09-13 NOTE — Telephone Encounter (Signed)
Seth [facility],  I would wait until his atrial fibrillation is under better control.  Otherwise there is a good chance he will show for surgery and anesthesia will cancel him.

## 2019-09-13 NOTE — Telephone Encounter (Signed)
Spoke with [facility] and advised them if pt surgery can be put off until his A-fib is under control, nurse stated she will send to doctor and surgical team.

## 2019-09-13 NOTE — Telephone Encounter (Signed)
Dr. Duke Salvia, patient was seen by me and was stable during recent visit. Cardiac cath did not show significant disease. I suspected his drop in EF was related to recurrent afib and referred him to the afib clinic. Would you recommend wait until afib clinic evaluation prior to final clearance? EF was 45% on echo in Feb, however dropped to < 25% by LV gram on cath 08/30/2019.

## 2019-09-13 NOTE — Telephone Encounter (Signed)
Please inform the requesting provider to delay the procedure until his atrial fibrillation can be better controlled. Recent drop in the ejection fraction likely related to recurrent afib

## 2019-09-20 ENCOUNTER — Encounter (HOSPITAL_COMMUNITY): Payer: Self-pay | Admitting: Physician Assistant

## 2019-09-20 ENCOUNTER — Other Ambulatory Visit: Payer: Self-pay

## 2019-09-20 ENCOUNTER — Telehealth: Payer: Self-pay | Admitting: Clinical

## 2019-09-20 ENCOUNTER — Ambulatory Visit (HOSPITAL_COMMUNITY)
Admission: RE | Admit: 2019-09-20 | Discharge: 2019-09-20 | Disposition: A | Discharge status: Home / Self Care | Payer: Medicaid Other | Source: Ambulatory Visit | Attending: Physician Assistant | Admitting: Physician Assistant

## 2019-09-20 VITALS — BP 110/82 | HR 132 | Ht 67.5 in | Wt 151.6 lb

## 2019-09-20 DIAGNOSIS — I110 Hypertensive heart disease with heart failure: Secondary | ICD-10-CM | POA: Diagnosis not present

## 2019-09-20 DIAGNOSIS — Z79899 Other long term (current) drug therapy: Secondary | ICD-10-CM | POA: Insufficient documentation

## 2019-09-20 DIAGNOSIS — I4819 Other persistent atrial fibrillation: Secondary | ICD-10-CM | POA: Insufficient documentation

## 2019-09-20 DIAGNOSIS — D6869 Other thrombophilia: Secondary | ICD-10-CM | POA: Diagnosis not present

## 2019-09-20 DIAGNOSIS — Z7901 Long term (current) use of anticoagulants: Secondary | ICD-10-CM | POA: Diagnosis not present

## 2019-09-20 DIAGNOSIS — I5042 Chronic combined systolic (congestive) and diastolic (congestive) heart failure: Secondary | ICD-10-CM | POA: Insufficient documentation

## 2019-09-20 DIAGNOSIS — Z87891 Personal history of nicotine dependence: Secondary | ICD-10-CM | POA: Diagnosis not present

## 2019-09-20 DIAGNOSIS — I428 Other cardiomyopathies: Secondary | ICD-10-CM | POA: Diagnosis not present

## 2019-09-20 LAB — COMPREHENSIVE METABOLIC PANEL
ALT: 33 U/L (ref 0–44)
AST: 41 U/L (ref 15–41)
Albumin: 4.2 g/dL (ref 3.5–5.0)
Alkaline Phosphatase: 100 U/L (ref 38–126)
Anion gap: 11 (ref 5–15)
BUN: 15 mg/dL (ref 6–20)
CO2: 20 mmol/L — ABNORMAL LOW (ref 22–32)
Calcium: 9.6 mg/dL (ref 8.9–10.3)
Chloride: 102 mmol/L (ref 98–111)
Creatinine, Ser: 1.17 mg/dL (ref 0.61–1.24)
GFR calc Af Amer: >60 mL/min (ref >60)
GFR calc non Af Amer: >60 mL/min (ref >60)
Glucose, Bld: 96 mg/dL (ref 70–99)
Potassium: 4.6 mmol/L (ref 3.5–5.1)
Sodium: 133 mmol/L — ABNORMAL LOW (ref 135–145)
Total Bilirubin: 3 mg/dL — ABNORMAL HIGH (ref 0.3–1.2)
Total Protein: 7.9 g/dL (ref 6.5–8.1)

## 2019-09-20 LAB — CBC
HCT: 44.4 % (ref 39.0–52.0)
Hemoglobin: 14.1 g/dL (ref 13.0–17.0)
MCH: 27.3 pg (ref 26.0–34.0)
MCHC: 31.8 g/dL (ref 30.0–36.0)
MCV: 85.9 fL (ref 80.0–100.0)
Platelets: 225 10*3/uL (ref 150–400)
RBC: 5.17 MIL/uL (ref 4.22–5.81)
RDW: 13.2 % (ref 11.5–15.5)
WBC: 7.6 10*3/uL (ref 4.0–10.5)
nRBC: 0 % (ref 0.0–0.2)

## 2019-09-20 LAB — TSH: TSH: 1.496 u[IU]/mL (ref 0.350–4.500)

## 2019-09-20 MED ORDER — AMIODARONE HCL 200 MG PO TABS
ORAL_TABLET | ORAL | 0 refills | Status: DC
Start: 2019-09-20 — End: 2019-10-04

## 2019-09-20 MED FILL — AMIODARONE HCL 200 MG TAB: 200 | 30 days supply | Qty: 60 | Fill #0

## 2019-09-20 NOTE — Progress Notes (Signed)
Primary Care Physician: Barbette Merino, NP Primary Cardiologist: Dr Duke Salvia Primary Electrophysiologist: none Referring Physician: Azalee Course PA   Seth [facility] is a 59 y.o. male with a history of atrial fibrillation, hypertension, and combined systolic and diastolic heart failure who presents for consultation in the [facility]. He was first diagnosed with atrial fibrillation/atrial flutter in [facility] in November 2020.  He was admitted in November 2020 to [facility] for hypertensive urgency and A. Fib.  He was started on Eliquis, aspirin and metoprolol.  After he moved to [place], he was admitted to the hospital in February 2021 with A. fib with RVR.  Echocardiogram at the time showed EF 45 to 50%, indeterminate diastolic function, mild MR, mild AI, mild to moderate TR.  He underwent TEE-DCCVx2 on 05/10/2019.  EF was 30 to 35% on TEE.  Metoprolol was increased to 150 mg daily. Thyroid function was mildly abnormal, therefore amiodarone was avoided.  Myoview obtained on 05/23/2019 showed fixed defect at the apex however reversible inferior defect suggest ischemia.  Cardiac catheterization was recommended.  By the time patient was seen by Edd Fabian on 08/27/2019, EKG shows he has went back into atrial fibrillation. Cardiac catheterization performed on 08/30/2019 showed EF less than 25% by visual estimate, LVEDP 12 mmHg, no significant CAD or aortic valve stenosis. 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 Patient is on Eliquis for a CHADS2VASC score of 2.   Today, patient reports he is unaware of his arrhythmia despite RVR. His primary concerns today are related to his hip pain. He is seeing his orthopedist on 09/25/19. Denies any missed doses of anticoagulation since his LHC. No h/o significant snoring or alcohol use.   Today, he denies symptoms of palpitations, chest pain, shortness of breath, orthopnea, PND, lower  extremity edema, dizziness, presyncope, syncope, snoring, daytime somnolence, bleeding, or neurologic sequela. The patient is tolerating medications without difficulties and is otherwise without complaint today.    Atrial Fibrillation Risk Factors:  he does not have symptoms or diagnosis of sleep apnea. he does not have a history of rheumatic fever. he does not have a history of alcohol use. The patient does not have a history of early familial atrial fibrillation or other arrhythmias.  he has a BMI of Body mass index is 23.39 kg/m.Marland Kitchen Filed Weights   09/20/19 1200  Weight: 68.8 kg    Family History  Problem Relation Age of Onset   Diabetes Mother    Diabetes Sister    Mental illness Sister    Mental illness Brother      Atrial Fibrillation Management history:  Previous antiarrhythmic drugs: none Previous cardioversions: 05/10/19 Previous ablations: none CHADS2VASC score: 2 Anticoagulation history: Eliquis   Past Medical History:  Diagnosis Date   Anemia    Atrial fibrillation (HCC)    Hypertension    Low back pain    Past Surgical History:  Procedure Laterality Date   CARDIOVERSION N/A 05/10/2019   Procedure: CARDIOVERSION;  Surgeon: Lewayne Bunting, MD;  Location: [facility] ENDOSCOPY;  Service: Cardiovascular;  Laterality: N/A;   COLONOSCOPY  07/12/2013   LEFT HEART CATH AND CORONARY ANGIOGRAPHY N/A 08/30/2019   Procedure: LEFT HEART CATH AND CORONARY ANGIOGRAPHY;  Surgeon: Corky Crafts, MD;  Location: [facility] INVASIVE CV LAB;  Service: Cardiovascular;  Laterality: N/A;   TEE WITHOUT CARDIOVERSION N/A 05/10/2019   Procedure: TRANSESOPHAGEAL ECHOCARDIOGRAM (TEE);  Surgeon: Lewayne Bunting, MD;  Location: [facility] ENDOSCOPY;  Service: Cardiovascular;  Laterality: N/A;    Current Outpatient Medications  Medication Sig Dispense Refill   acetaminophen (TYLENOL) 500 MG tablet Take 500 mg by mouth daily as needed for mild pain.     apixaban (ELIQUIS) 5 MG TABS  tablet Take 1 tablet (5 mg total) by mouth 2 (two) times daily. 60 tablet 2   atorvastatin (LIPITOR) 40 MG tablet Take 1 tablet (40 mg total) by mouth daily at 6 PM. (Patient taking differently: Take 40 mg by mouth in the morning and at bedtime. ) 90 tablet 3   gabapentin (NEURONTIN) 300 MG capsule TAKE 1 CAPSULE BY MOUTH EVERYDAY AT BEDTIME (Patient taking differently: Take 300 mg by mouth 2 (two) times daily. ) 30 capsule 1   hydrocortisone 1 % lotion Apply 1 application topically 2 (two) times daily. 118 mL 0   metoprolol succinate (TOPROL-XL) 100 MG 24 hr tablet Take 2 tablets (200 mg total) by mouth daily. Take with or immediately following a meal. 90 tablet 7   amiodarone (PACERONE) 200 MG tablet Take 1 tablet by mouth twice a day for 1 month 60 tablet 0   No current facility-administered medications for this encounter.    No Known Allergies  Social History   Socioeconomic History   Marital status: Single    Spouse name: Not on file   Number of children: Not on file   Years of education: Not on file   Highest education level: Not on file  Occupational History   Not on file  Tobacco Use   Smoking status: Former Smoker    Quit date: 12/21/2018    Years since quitting: 0.7   Smokeless tobacco: Never Used  Substance and Sexual Activity   Alcohol use: Not Currently   Drug use: Never   Sexual activity: Not on file  Other Topics Concern   Not on file  Social History Narrative   Not on file   Social Determinants of Health   Financial Resource Strain:    Difficulty of Paying Living Expenses:   Food Insecurity:    Worried About Programme researcher, broadcasting/film/video in the Last Year:    Barista in the Last Year:   Transportation Needs:    Freight forwarder (Medical):    Lack of Transportation (Non-Medical):   Physical Activity:    Days of Exercise per Week:    Minutes of Exercise per Session:   Stress:    Feeling of Stress :   Social Connections:     Frequency of Communication with Friends and Family:    Frequency of Social Gatherings with Friends and Family:    Attends Religious Services:    Active Member of Clubs or Organizations:    Attends Engineer, structural:    Marital Status:   Intimate Partner Violence:    Fear of Current or Ex-Partner:    Emotionally Abused:    Physically Abused:    Sexually Abused:      ROS- All systems are reviewed and negative except as per the HPI above.  Physical Exam: Vitals:   09/20/19 1200  BP: 110/82  Pulse: (!) 132  Weight: 68.8 kg  Height: 5' 7.5" (1.715 m)    GEN- The patient is well appearing, alert and oriented x 3 today.   Head- normocephalic, atraumatic Eyes-  Sclera clear, conjunctiva pink Ears- hearing intact Oropharynx- clear Neck- supple  Lungs- Clear to ausculation bilaterally, normal work of breathing Heart- irregular rate and rhythm, tachycardia,  no murmurs, rubs or gallops  GI- soft, NT, ND, + BS Extremities- no clubbing, cyanosis, or edema MS- no significant deformity or atrophy Skin- no rash or lesion Psych- euthymic mood, full affect Neuro- strength and sensation are intact  Wt Readings from Last 3 Encounters:  09/20/19 68.8 kg  09/10/19 70.6 kg  09/07/19 71.6 kg    EKG today demonstrates afib HR 132, QRS 86, QTc 420  Echo 05/07/19 demonstrated  1. Left ventricular ejection fraction, by estimation, is 45 to 50%. The  left ventricle has mildly decreased function. The left ventricle  demonstrates global hypokinesis. Left ventricular diastolic function could  not be evaluated.  2. Right ventricular systolic function is low normal. The right  ventricular size is normal. There is mildly elevated pulmonary artery  systolic pressure.  3. The mitral valve is grossly normal. Mild mitral valve regurgitation.  4. Tricuspid valve regurgitation is mild to moderate.  5. The aortic valve is tricuspid. Aortic valve regurgitation is mild. No    aortic stenosis is present.  6. There is mild dilatation of the ascending aorta measuring 39 mm.  7. The inferior vena cava is dilated in size with >50% respiratory  variability, suggesting right atrial pressure of 8 mmHg.   LHC 08/30/19  There is severe left ventricular systolic dysfunction.  The left ventricular ejection fraction is less than 25% by visual estimate.  LV end diastolic pressure is normal. LVEDP 12 mm Hg.  There is no aortic valve stenosis.  No significant CAD.  Severe vasospasm of right radial artery, even with 4 Fr catheter. Would not use the right radial artery if cath was needed in the future.   Epic records are reviewed at length today  CHA2DS2-VASc Score = 2  The patient's score is based upon: CHF History: 1 HTN History: 1 Age : 0 Diabetes History: 0 Stroke History: 0 Vascular Disease History: 0 Gender: 0      ASSESSMENT AND PLAN: 1. Persistent Atrial Fibrillation (ICD10:  I48.19) The patient's CHA2DS2-VASc score is 2, indicating a 2.2% annual risk of stroke.   Patient remains in rapid afib.  We discussed therapeutic options today including AAD (Tikosyn, amiodarone), ablation and DCCV. Would avoid Multaq and class 1C with CHF.  On recent blood work his TSH had normalized. Will start amiodarone 200 mg BID and repeat DCCV. 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  Check Cmet/TSH/CBC He has applied for Medicaid and this is pending. Will also refer to SW to assist.  Continue Eliquis 5 mg BID. (Resumed 08/31/19 after LHC) Continue Toprol 100 mg BID  2. Secondary Hypercoagulable State (ICD10:  D68.69) The patient is at significant risk for stroke/thromboembolism based upon his CHA2DS2-VASc Score of 2.  Continue Apixaban (Eliquis).   3.HTN Stable, no changes today.  4. 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 No signs or  symptoms of fluid overload. See plans above.    Follow up in the AF clinic next week.    [name] PA-C Afib Clinic [facility] [address] [place], [place] [address] [phone] 09/20/2019 12:44 PM

## 2019-09-20 NOTE — Telephone Encounter (Signed)
[facility] Case Management Referral Note  09/20/2019 Name: Jayanth Cosmo Tetreault MRN: [id number] DOB: 14-Jan-1961 Selby Nafis Farnan is a 59 y.o. year old male who sees Barbette Merino, NP for primary care. LCSW was consulted to assess patient's needs and assist the patient with Financial Difficulties related to lack of health coverage.  Interpreter: Yes.     Interpreter Name & LanguageFrann Rider #[id number]  Assessment: Patient experiencing Financial constraints related to lack of health coverage. Received referral from PCP regarding patient difficulties with Medicaid application. CSW called patient with interpreter on 09/14/19, patient did not answer, LVM with help of interpreter. Called patient again today and also LVM with interpreter. CSW will be available from clinic if patient returns call and will attempt to follow up with patient in person at next PCP visit.   Review of patient status, including review of consultants reports, relevant laboratory and other test results, and collaboration with appropriate care team members and the patient's provider was performed as part of comprehensive patient evaluation and provision of services.    SDOH (Social Determinants of Health) assessments performed: No  Outpatient Encounter Medications as of 09/20/2019  Medication Sig  . acetaminophen (TYLENOL) 500 MG tablet Take 500 mg by mouth daily as needed for mild pain.  Marland Kitchen amiodarone (PACERONE) 200 MG tablet Take 1 tablet by mouth twice a day for 1 month  . apixaban (ELIQUIS) 5 MG TABS tablet Take 1 tablet (5 mg total) by mouth 2 (two) times daily.  Marland Kitchen atorvastatin (LIPITOR) 40 MG tablet Take 1 tablet (40 mg total) by mouth daily at 6 PM. (Patient taking differently: Take 40 mg by mouth in the morning and at bedtime. )  . gabapentin (NEURONTIN) 300 MG capsule TAKE 1 CAPSULE BY MOUTH EVERYDAY AT BEDTIME (Patient taking differently: Take 300 mg by mouth 2 (two) times daily. )  . hydrocortisone 1 %  lotion Apply 1 application topically 2 (two) times daily.  . metoprolol succinate (TOPROL-XL) 100 MG 24 hr tablet Take 2 tablets (200 mg total) by mouth daily. Take with or immediately following a meal.   No facility-administered encounter medications on file as of 09/20/2019.    Goals Addressed   None      Follow up Plan: 1. CSW available from clinic as needed  Abigail Butts, LCSW [facility] [phone]

## 2019-09-20 NOTE — Patient Instructions (Signed)
Amioarone 200mg  ????? ??? ??? ? ??????? ???? ???? ????????? ?? ???? ????? ????? ?? ??? ???mg ?? ?? ????????  ????? ??????? ????? ?? ??? ???????????? Amioarone 200mg  dinak? du'? pa?aka 1 mahin?k? l?gi sur? garnuh?s taba h?m? yasal?'? dinak? ?ka pa?aka 200mg  m? kama garn?chau?  ark? bihib?ra bih?na 11 baj? pachy?'unuh?s   Amiodarone 200mg  twice a day for one month then reduce to 200mg  once a day

## 2019-09-25 ENCOUNTER — Encounter: Payer: Self-pay | Admitting: Orthopaedic Surgery

## 2019-09-25 ENCOUNTER — Ambulatory Visit (INDEPENDENT_AMBULATORY_CARE_PROVIDER_SITE_OTHER): Payer: Self-pay | Admitting: Orthopaedic Surgery

## 2019-09-25 VITALS — Ht 67.5 in | Wt 151.0 lb

## 2019-09-25 DIAGNOSIS — M5020 Other cervical disc displacement, unspecified cervical region: Secondary | ICD-10-CM | POA: Diagnosis not present

## 2019-09-25 NOTE — Progress Notes (Signed)
Office Visit Note   Patient: Seth Snyder           Date of Birth: 09/20/1960           MRN: [id number] Visit Date: 09/25/2019              Requested by: Barbette Merino, NP [address] [place],  [place] [address] PCP: Barbette Merino, NP   Assessment & Plan: Visit Diagnoses:  1. Protrusion of cervical intervertebral disc     Plan: Patient with atrial fibrillation.  He has cardiac follow-up coming within the next 10 days.  Plan to recheck him in 2 weeks.  If he is continuing to have back and hip pain we can obtain radiographs.  Cervical procedure is on hold pending cardiology clearance.  Recheck 2 weeks.  Follow-Up Instructions: Return in about 2 weeks (around 10/09/2019).   Orders:  No orders of the defined types were placed in this encounter.  No orders of the defined types were placed in this encounter.     Procedures: No procedures performed   Clinical Data: No additional findings.   Subjective: Chief Complaint  Patient presents with  . Neck - Pain  . Lower Back - Pain  . Right Leg - Pain  . Right Arm - Pain    HPI 59 year old male returns for follow-up of cervical spondylosis with radiculopathy.  Patient is from [place] and we had to get [place] translator with a verbal connection.  Patient's had problems with atrial fibrillation had cardiology visit which study showed ejection fraction 25% where previously it had been higher estimated at 45 to 50% by echo on 05/07/2019.  Patient is on Eliquis for atrial fibrillation.  He now states she is having some groin pain and low back pain in addition to continued problems with his neck.  He has follow-up either later this week or next week with cardiology.  Review of Systems unchanged from 08/08/2019 office visit.   Objective: Vital Signs: Ht 5' 7.5" (1.715 m)   Wt 151 lb (68.5 kg)   BMI 23.30 kg/m   Physical Exam Constitutional:      Appearance: He is well-developed.  HENT:     Head: Normocephalic and  atraumatic.  Eyes:     Pupils: Pupils are equal, round, and reactive to light.  Neck:     Thyroid: No thyromegaly.     Trachea: No tracheal deviation.  Cardiovascular:     Rate and Rhythm: Normal rate.  Pulmonary:     Effort: Pulmonary effort is normal.     Breath sounds: No wheezing.  Abdominal:     General: Bowel sounds are normal.     Palpations: Abdomen is soft.  Skin:    General: Skin is warm and dry.     Capillary Refill: Capillary refill takes less than 2 seconds.  Neurological:     Mental Status: He is alert and oriented to person, place, and time.  Psychiatric:        Behavior: Behavior normal.        Thought Content: Thought content normal.        Judgment: Judgment normal.     Ortho Exam patient still has brachial plexus tenderness on the right worse than left positive Spurling on the right.  Still has right triceps weakness.  Negative logroll the hips right and left.  Negative straight leg raising 90 degrees.  Specialty Comments:  No specialty comments available.  Imaging: No results found.  PMFS History: Patient Active Problem List   Diagnosis Date Noted  . Persistent atrial fibrillation (HCC) 09/20/2019  . Secondary hypercoagulable state (HCC) 09/20/2019  . Abnormal nuclear stress test   . Spinal stenosis of cervical region 08/13/2019  . Protrusion of cervical intervertebral disc 08/13/2019  . Shoulder pain   . Chronic combined systolic and diastolic heart failure (HCC)   . Atrial fibrillation with RVR (HCC) 05/07/2019  . Hypertension 05/07/2019  . Hypothyroidism 05/07/2019  . Hypomagnesemia 05/07/2019  . Elevated troponin 05/07/2019  . Acute pulmonary edema (HCC)    Past Medical History:  Diagnosis Date  . Anemia   . Atrial fibrillation (HCC)   . Hypertension   . Low back pain     Family History  Problem Relation Age of Onset  . Diabetes Mother   . Diabetes Sister   . Mental illness Sister   . Mental illness Brother     Past Surgical  History:  Procedure Laterality Date  . CARDIOVERSION N/A 05/10/2019   Procedure: CARDIOVERSION;  Surgeon: Lewayne Bunting, MD;  Location: [facility] ENDOSCOPY;  Service: Cardiovascular;  Laterality: N/A;  . COLONOSCOPY  07/12/2013  . LEFT HEART CATH AND CORONARY ANGIOGRAPHY N/A 08/30/2019   Procedure: LEFT HEART CATH AND CORONARY ANGIOGRAPHY;  Surgeon: Corky Crafts, MD;  Location: [facility] INVASIVE CV LAB;  Service: Cardiovascular;  Laterality: N/A;  . TEE WITHOUT CARDIOVERSION N/A 05/10/2019   Procedure: TRANSESOPHAGEAL ECHOCARDIOGRAM (TEE);  Surgeon: Lewayne Bunting, MD;  Location: [facility] ENDOSCOPY;  Service: Cardiovascular;  Laterality: N/A;   Social History   Occupational History  . Not on file  Tobacco Use  . Smoking status: Former Smoker    Quit date: 12/21/2018    Years since quitting: 0.7  . Smokeless tobacco: Never Used  Substance and Sexual Activity  . Alcohol use: Not Currently  . Drug use: Never  . Sexual activity: Not on file

## 2019-09-26 ENCOUNTER — Encounter (HOSPITAL_COMMUNITY): Payer: Self-pay | Admitting: Physician Assistant

## 2019-09-26 ENCOUNTER — Ambulatory Visit (HOSPITAL_COMMUNITY)
Admission: RE | Admit: 2019-09-26 | Discharge: 2019-09-26 | Disposition: A | Discharge status: Home / Self Care | Payer: Medicaid Other | Source: Ambulatory Visit | Attending: Physician Assistant | Admitting: Physician Assistant

## 2019-09-26 ENCOUNTER — Other Ambulatory Visit: Payer: Self-pay

## 2019-09-26 VITALS — BP 130/90 | HR 59 | Ht 67.5 in | Wt 158.0 lb

## 2019-09-26 DIAGNOSIS — Z01818 Encounter for other preprocedural examination: Secondary | ICD-10-CM | POA: Insufficient documentation

## 2019-09-26 DIAGNOSIS — Z79899 Other long term (current) drug therapy: Secondary | ICD-10-CM | POA: Insufficient documentation

## 2019-09-26 DIAGNOSIS — I110 Hypertensive heart disease with heart failure: Secondary | ICD-10-CM | POA: Insufficient documentation

## 2019-09-26 DIAGNOSIS — D6869 Other thrombophilia: Secondary | ICD-10-CM | POA: Insufficient documentation

## 2019-09-26 DIAGNOSIS — Z7901 Long term (current) use of anticoagulants: Secondary | ICD-10-CM | POA: Diagnosis not present

## 2019-09-26 DIAGNOSIS — I4819 Other persistent atrial fibrillation: Secondary | ICD-10-CM

## 2019-09-26 DIAGNOSIS — Z20822 Contact with and (suspected) exposure to covid-19: Secondary | ICD-10-CM | POA: Insufficient documentation

## 2019-09-26 DIAGNOSIS — Z87891 Personal history of nicotine dependence: Secondary | ICD-10-CM | POA: Insufficient documentation

## 2019-09-26 DIAGNOSIS — I4892 Unspecified atrial flutter: Secondary | ICD-10-CM | POA: Insufficient documentation

## 2019-09-26 DIAGNOSIS — I5042 Chronic combined systolic (congestive) and diastolic (congestive) heart failure: Secondary | ICD-10-CM | POA: Diagnosis not present

## 2019-09-26 LAB — BASIC METABOLIC PANEL
Anion gap: 10 (ref 5–15)
BUN: 13 mg/dL (ref 6–20)
CO2: 22 mmol/L (ref 22–32)
Calcium: 9.2 mg/dL (ref 8.9–10.3)
Chloride: 105 mmol/L (ref 98–111)
Creatinine, Ser: 1.31 mg/dL — ABNORMAL HIGH (ref 0.61–1.24)
GFR calc Af Amer: >60 mL/min (ref >60)
GFR calc non Af Amer: >60 mL/min (ref >60)
Glucose, Bld: 102 mg/dL — ABNORMAL HIGH (ref 70–99)
Potassium: 4.9 mmol/L (ref 3.5–5.1)
Sodium: 137 mmol/L (ref 135–145)

## 2019-09-26 LAB — CBC
HCT: 43 % (ref 39.0–52.0)
Hemoglobin: 13.5 g/dL (ref 13.0–17.0)
MCH: 27.7 pg (ref 26.0–34.0)
MCHC: 31.4 g/dL (ref 30.0–36.0)
MCV: 88.3 fL (ref 80.0–100.0)
Platelets: 212 10*3/uL (ref 150–400)
RBC: 4.87 MIL/uL (ref 4.22–5.81)
RDW: 13.8 % (ref 11.5–15.5)
WBC: 7.9 10*3/uL (ref 4.0–10.5)
nRBC: 0 % (ref 0.0–0.2)

## 2019-09-26 MED FILL — ?ATORVASTATIN 40MG TABLET: 40 | 30 days supply | Qty: 30 | Fill #1

## 2019-09-26 NOTE — Patient Instructions (Signed)
????? ??????, ????? ?   Monday ??? ?? ???????????????? ???? ?????????? ?????????? ????? 8 ??? ????????? ????? ????????????? ????????????? ???? ?? ???? ??????? - ??????? ???? ???????? ???? ??? ??? ???? ??? ?????????? ??????? ??????? ??????? ?????? ?????????? ????????? ?????? ???????? ???? ??? ????? ???????????? ??????????? ???? ???????? ????????? ????? ?????????? ????? ???? ???? ? ??????? ???? ??????????  ???????? ????????, ????? ?rd ????? 30: .? ?? ?? ????? ????????? ?????? ?????? - ?? ???????? ????????? 80?? ????? ?????? ????? ?????????  ??????????? ??? ??? ??????? ???? ??????????? Tap?'im? s?mab?ra, jul?'? 2 Monday l?'? ?ka k?r?iy?bh?r??anak? l?gi t?lik?bad'dha hunuhuncha. Bih?na 8 baj? aspat?lak? mukhya prav??adv?ram? ?'ipugnuh?s. 1121 ?na carca s?ri?a - sv?k?ra garna j?nuh?s madhya r?ta pachi k?hi pani nakh?nuh?s. Tap?'??k? bih?nak? au?adh?har? p?n?k? cusk?sahita linuh?s. Tap?'?k? ?likisak? kunai pani khur?ka nabhulnuh?s. ?isc?rjak? l?gi tap?'?nsam?ga jim'm?v?ra vayaska hunupardacha. Tap?'?? yah?m? lagabhaga 2 gha???k? l?gi hunuhuncha.  Tap?'?nl?'? ?ukrab?ra, jul?'? 2rd bih?na 30: .0 M? ?ka k?bhi?a par?k?a?ak? ?va?yaka parn?cha - yasa ?r?'ibhak? m?dhyamab??a 8001 gr?na bhy?l? r??am? j?nuh?s.  K?r?i'?bharasana pachi sabai au?adh?har? j?r? r?khnuh?s.    You are scheduled for a cardioversion on Monday, July 26th. Arrive at the main entrance of the hospital at 8am. [address] - go to admitting Do not eat or drink anything after midnight.  Take your morning medications with a sip of water. Do not miss any doses of your Eliquis. You must have a responsible adult with you for discharge. You will be here for about 2 hours.  You will need a covid test on Friday, July 23rd at 830am - go to [address] [employer] for this drive thru testing.  Continue all medications after cardioversion.

## 2019-09-26 NOTE — Progress Notes (Signed)
Primary Care Physician: Barbette Merino, NP Primary Cardiologist: Dr Duke Salvia Primary Electrophysiologist: none Referring Physician: Azalee Course PA   Seth Snyder is a 59 y.o. male with a history of atrial fibrillation, hypertension, and combined systolic and diastolic heart failure who presents for follow up in the [facility]. He was first diagnosed with atrial fibrillation/atrial flutter in [facility] in November 2020.  He was admitted in November 2020 to [facility] for hypertensive urgency and A. Fib.  He was started on Eliquis, aspirin and metoprolol.  After he moved to [place], he was admitted to the hospital in February 2021 with A. fib with RVR.  Echocardiogram at the time showed EF 45 to 50%, indeterminate diastolic function, mild MR, mild AI, mild to moderate TR.  He underwent TEE-DCCVx2 on 05/10/2019.  EF was 30 to 35% on TEE.  Metoprolol was increased to 150 mg daily. Thyroid function was mildly abnormal, therefore amiodarone was avoided.  Myoview obtained on 05/23/2019 showed fixed defect at the apex however reversible inferior defect suggest ischemia.  Cardiac catheterization was recommended.  By the time patient was seen by Edd Fabian on 08/27/2019, EKG shows he has went back into atrial fibrillation. Cardiac catheterization performed on 08/30/2019 showed EF less than 25% by visual estimate, LVEDP 12 mmHg, no significant CAD or aortic valve stenosis. 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 Patient is on Eliquis for a CHADS2VASC score of 2. Patient reports he is unaware of his arrhythmia despite RVR. No h/o significant snoring or alcohol use.   On follow up today, patient reports he is doing about the same as his last visit. He remains unaware of his arrhythmia. His heart rate is much improved on amiodarone. He denies bleeding issues on anticoagulation. His main concerns today are his upcoming orthopedic  surgery and his medical bills. CSW to see today.  Today, he denies symptoms of palpitations, chest pain, shortness of breath, orthopnea, PND, lower extremity edema, dizziness, presyncope, syncope, snoring, daytime somnolence, bleeding, or neurologic sequela. The patient is tolerating medications without difficulties and is otherwise without complaint today.    Atrial Fibrillation Risk Factors:  he does not have symptoms or diagnosis of sleep apnea. he does not have a history of rheumatic fever. he does not have a history of alcohol use. The patient does not have a history of early familial atrial fibrillation or other arrhythmias.  he has a BMI of Body mass index is 24.38 kg/m.Marland Kitchen Filed Weights   09/26/19 0843  Weight: 71.7 kg    Family History  Problem Relation Age of Onset  . Diabetes Mother   . Diabetes Sister   . Mental illness Sister   . Mental illness Brother      Atrial Fibrillation Management history:  Previous antiarrhythmic drugs: amiodarone Previous cardioversions: 05/10/19 Previous ablations: none CHADS2VASC score: 2 Anticoagulation history: Eliquis   Past Medical History:  Diagnosis Date  . Anemia   . Atrial fibrillation (HCC)   . Hypertension   . Low back pain    Past Surgical History:  Procedure Laterality Date  . CARDIOVERSION N/A 05/10/2019   Procedure: CARDIOVERSION;  Surgeon: Lewayne Bunting, MD;  Location: [facility] ENDOSCOPY;  Service: Cardiovascular;  Laterality: N/A;  . COLONOSCOPY  07/12/2013  . LEFT HEART CATH AND CORONARY ANGIOGRAPHY N/A 08/30/2019   Procedure: LEFT HEART CATH AND CORONARY ANGIOGRAPHY;  Surgeon: Corky Crafts, MD;  Location: [facility] INVASIVE CV LAB;  Service: Cardiovascular;  Laterality: N/A;  . TEE WITHOUT CARDIOVERSION N/A 05/10/2019   Procedure: TRANSESOPHAGEAL ECHOCARDIOGRAM (TEE);  Surgeon: Lewayne Bunting, MD;  Location: [facility] ENDOSCOPY;  Service: Cardiovascular;  Laterality: N/A;    Current Outpatient Medications    Medication Sig Dispense Refill  . acetaminophen (TYLENOL) 500 MG tablet Take 500 mg by mouth daily as needed for mild pain.    Marland Kitchen amiodarone (PACERONE) 200 MG tablet Take 1 tablet by mouth twice a day for 1 month 60 tablet 0  . apixaban (ELIQUIS) 5 MG TABS tablet Take 1 tablet (5 mg total) by mouth 2 (two) times daily. 60 tablet 2  . atorvastatin (LIPITOR) 40 MG tablet Take 1 tablet (40 mg total) by mouth daily at 6 PM. 90 tablet 3  . gabapentin (NEURONTIN) 300 MG capsule TAKE 1 CAPSULE BY MOUTH EVERYDAY AT BEDTIME 30 capsule 1  . hydrocortisone 1 % lotion Apply 1 application topically 2 (two) times daily. 118 mL 0  . metoprolol succinate (TOPROL-XL) 100 MG 24 hr tablet Take 2 tablets (200 mg total) by mouth daily. Take with or immediately following a meal. 90 tablet 7   No current facility-administered medications for this encounter.    No Known Allergies  Social History   Socioeconomic History  . Marital status: Single    Spouse name: Not on file  . Number of children: Not on file  . Years of education: Not on file  . Highest education level: Not on file  Occupational History  . Not on file  Tobacco Use  . Smoking status: Former Smoker    Quit date: 12/21/2018    Years since quitting: 0.7  . Smokeless tobacco: Never Used  Substance and Sexual Activity  . Alcohol use: Not Currently  . Drug use: Never  . Sexual activity: Not on file  Other Topics Concern  . Not on file  Social History Narrative  . Not on file   Social Determinants of Health   Financial Resource Strain:   . Difficulty of Paying Living Expenses:   Food Insecurity: No Food Insecurity  . Worried About Programme researcher, broadcasting/film/video in the Last Year: Never true  . Ran Out of Food in the Last Year: Never true  Transportation Needs:   . Lack of Transportation (Medical):   Marland Kitchen Lack of Transportation (Non-Medical):   Physical Activity:   . Days of Exercise per Week:   . Minutes of Exercise per Session:   Stress:   .  Feeling of Stress :   Social Connections:   . Frequency of Communication with Friends and Family:   . Frequency of Social Gatherings with Friends and Family:   . Attends Religious Services:   . Active Member of Clubs or Organizations:   . Attends Banker Meetings:   Marland Kitchen Marital Status:   Intimate Partner Violence:   . Fear of Current or Ex-Partner:   . Emotionally Abused:   Marland Kitchen Physically Abused:   . Sexually Abused:      ROS- All systems are reviewed and negative except as per the HPI above.  Physical Exam: Vitals:   09/26/19 0843  BP: 130/90  Pulse: (!) 59  Weight: 71.7 kg  Height: 5' 7.5" (1.715 m)    GEN- The patient is well appearing, alert and oriented x 3 today.   HEENT-head normocephalic, atraumatic, sclera clear, conjunctiva pink, hearing intact, trachea midline. Lungs- Clear to ausculation bilaterally, normal work of breathing Heart- irregular rate and rhythm, no murmurs, rubs or  gallops  GI- soft, NT, ND, + BS Extremities- no clubbing, cyanosis, or edema MS- no significant deformity or atrophy Skin- no rash or lesion Psych- euthymic mood, full affect Neuro- strength and sensation are intact   Wt Readings from Last 3 Encounters:  09/26/19 71.7 kg  09/25/19 68.5 kg  09/20/19 68.8 kg    EKG today demonstrates afib HR 59, QRS 96, QTc 433  Echo 05/07/19 demonstrated  1. Left ventricular ejection fraction, by estimation, is 45 to 50%. The  left ventricle has mildly decreased function. The left ventricle  demonstrates global hypokinesis. Left ventricular diastolic function could  not be evaluated.  2. Right ventricular systolic function is low normal. The right  ventricular size is normal. There is mildly elevated pulmonary artery  systolic pressure.  3. The mitral valve is grossly normal. Mild mitral valve regurgitation.  4. Tricuspid valve regurgitation is mild to moderate.  5. The aortic valve is tricuspid. Aortic valve regurgitation is  mild. No  aortic stenosis is present.  6. There is mild dilatation of the ascending aorta measuring 39 mm.  7. The inferior vena cava is dilated in size with >50% respiratory  variability, suggesting right atrial pressure of 8 mmHg.   LHC 08/30/19  There is severe left ventricular systolic dysfunction.  The left ventricular ejection fraction is less than 25% by visual estimate.  LV end diastolic pressure is normal. LVEDP 12 mm Hg.  There is no aortic valve stenosis.  No significant CAD.  Severe vasospasm of right radial artery, even with 4 Fr catheter. Would not use the right radial artery if cath was needed in the future.   Epic records are reviewed at length today  CHA2DS2-VASc Score = 2  The patient's score is based upon: CHF History: 1 HTN History: 1 Age : 0 Diabetes History: 0 Stroke History: 0 Vascular Disease History: 0 Gender: 0      ASSESSMENT AND PLAN: 1. Persistent Atrial Fibrillation (ICD10:  I48.19) The patient's CHA2DS2-VASc score is 2, indicating a 2.2% annual risk of stroke.   He remains in afib but his heart rate is much improved.  Would avoid Multaq and class 1C with CHF.  Continue amiodarone 200 mg BID and repeat DCCV.  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  He has applied for Medicaid and this is pending. Discussed with CSW today. Continue Eliquis 5 mg BID. (Resumed 08/31/19 after LHC) Continue Toprol 100 mg BID  2. Secondary Hypercoagulable State (ICD10:  D68.69) The patient is at significant risk for stroke/thromboembolism based upon his CHA2DS2-VASc Score of 2.  Continue Apixaban (Eliquis).   3.HTN Stable, no changes today.  4. 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 No signs or symptoms of fluid overload.   Follow up in the AF clinic one week post DCCV.   [name] PA-C Afib Clinic [facility] [address] [place], [place] [address] [phone] 09/26/2019 5:36 PM

## 2019-09-26 NOTE — Progress Notes (Signed)
Heart and Vascular Care Navigation  09/26/2019  Santi Austan Nicholl 1961-02-13 [id number]  Reason for Referral: CSW referred to assist pt with Medicaid- pt brought in form from Medicaid today to ensure he is doing what he needs to be to get case approved.                                                                                                    Assessment:   CSW met with pt and Nepali interpreter and reviewed form.  Form is from local DHHS office explaining that case is now with DDS to review his disability- does not contain any instructions on anything further that pt needs to do at this time.  CSW left message with DDS case worker to confirm pt does not need to complete anything at this time.  Pt then inquiring about [facility] bills- states that he has been getting calls from collection agencies regarding outstanding bills.  CSW unable to see any bills that have been sent to collections in the [facility] system but pt is agreeable to CSW calling [facility] financial counselor to discuss.  CSW spoke with billing who reports they do not show any bills that have gone to bad debt at this time- they do report that he will have 2 bills that go to collections at the end of this month.  Due to the large amount pt owes she states the lowest payment plan they could give him is $1000/month which is completely unaffordable for pt at this time with no source of income.    Pt able to provide CSW with number that he received calls from in regards to his debt [phone]).  CSW called them with pt permission and was able to confirm they are following up regarding a $100.76 charge from a test while the pt was inpatient back in February- this bill is apparently separate from his hospital stay due to the MD that provided the test (Dr. Danelle Earthly?).  If pt is approved for Medicaid this bill would hopefully be covered by retro Medicaid.   Pt is concerned about his finances at this time as he has no source of income.  Reports  he stays with his daughter who covers all of his expenses but that he used to pay rent to her when he was working.  Pt reports he is not in danger of losing housing and is able to get his medications through [facility] and assist with food expenses though food stamp benefits.  Pt reports he has not applied for disability benefits which would be the only way he could get income at this time as he is not able to work due to his medical conditions.  CSW assisted pt in making telephone appt with [facility] on July 21st at 11am.  Pt also provided information on how to apply to SSDI online if his daughter is able to help him complete this.  Pt also informed CSW of need for a cane and compression socks- CSW able to get for pt through patient care fund- due to arrive July 9th.  HRT/VAS Care Coordination    Patients Home Cardiology Office [place] Team Social Worker   Social Worker Name: Tammy Sours- Heart and Vascular- [phone]   Living arrangements for the past 2 months [place] with: Adult Children   Patient Current Insurance Coverage [place] (specify quad or straight)  quad cane provided for pt through [facility] [place]   DME Agency NA   [place]      Social History:                                                                             SDOH Screenings   Alcohol Screen:   . Last Alcohol Screening Score (AUDIT):   Depression (PHQ2-9): Low Risk   . PHQ-2 Score: 0  Financial Resource Strain:   . Difficulty of Paying Living Expenses:   Food Insecurity: No Food Insecurity  . Worried About Charity fundraiser in the Last Year: Never true  . Ran Out of Food in the Last Year: Never true  Housing: Low Risk   . Last Housing Risk Score: 0  Physical Activity:   . Days of Exercise per Week:   . Minutes of Exercise per Session:   Social Connections:   . Frequency of Communication  with Friends and Family:   . Frequency of Social Gatherings with Friends and Family:   . Attends Religious Services:   . Active Member of Clubs or Organizations:   . Attends Archivist Meetings:   Marland Kitchen Marital Status:   Stress:   . Feeling of Stress :   Tobacco Use: Medium Risk  . Smoking Tobacco Use: Former Smoker  . Smokeless Tobacco Use: Never Used  Transportation Needs:   . Film/video editor (Medical):   Marland Kitchen Lack of Transportation (Non-Medical):     SDOH Interventions: Financial Resources:    appt for SSDI made for July 21st at [place]:  Food Insecurity Interventions: Other (Comment) (already receiving food stamps)  Housing Insecurity:   none reported- lives with daughter who is providing for him at this time.  Transportation:    none reported   Other Care Navigation Interventions:     Inpatient/Outpatient Substance Abuse Counseling/Rehab Options n/a  Provided Pharmacy assistance resources  already receiving assistance through [place]- believe he has a blue card  Patient expressed Mental Health concerns None reported   Follow-up plan:    CSW will continue to try to talk to pt DDS worker, Precious Bard [phone] ext [phone] to confirm what pt needs to be doing to proceed with case.  Jorge Ny, LCSW Clinical Social Worker [facility] Desk#: [phone] Cell#: [phone]

## 2019-09-27 ENCOUNTER — Ambulatory Visit (HOSPITAL_COMMUNITY): Payer: Self-pay | Admitting: Physician Assistant

## 2019-10-04 ENCOUNTER — Other Ambulatory Visit: Payer: Self-pay | Admitting: Nurse Practitioner

## 2019-10-04 ENCOUNTER — Other Ambulatory Visit (HOSPITAL_COMMUNITY): Payer: Self-pay | Admitting: Physician Assistant

## 2019-10-04 MED FILL — GABAPENTIN 300 MG CAPSULE: 300 | 30 days supply | Qty: 90 | Fill #2

## 2019-10-04 MED FILL — HYDROCORTISONE 1 % LOTN: 1 | 30 days supply | Qty: 120 | Fill #0

## 2019-10-08 ENCOUNTER — Ambulatory Visit (INDEPENDENT_AMBULATORY_CARE_PROVIDER_SITE_OTHER): Payer: Medicaid Other | Admitting: Nurse Practitioner

## 2019-10-08 ENCOUNTER — Other Ambulatory Visit: Payer: Self-pay

## 2019-10-08 ENCOUNTER — Encounter: Payer: Self-pay | Admitting: Nurse Practitioner

## 2019-10-08 VITALS — BP 158/92 | HR 66 | Temp 98.6°F | Resp 16 | Ht 67.5 in | Wt 157.0 lb

## 2019-10-08 DIAGNOSIS — I4891 Unspecified atrial fibrillation: Secondary | ICD-10-CM | POA: Diagnosis not present

## 2019-10-08 DIAGNOSIS — L0291 Cutaneous abscess, unspecified: Secondary | ICD-10-CM

## 2019-10-08 DIAGNOSIS — M4802 Spinal stenosis, cervical region: Secondary | ICD-10-CM | POA: Diagnosis not present

## 2019-10-08 DIAGNOSIS — M5442 Lumbago with sciatica, left side: Secondary | ICD-10-CM

## 2019-10-08 DIAGNOSIS — F329 Major depressive disorder, single episode, unspecified: Secondary | ICD-10-CM

## 2019-10-08 DIAGNOSIS — F32A Depression, unspecified: Secondary | ICD-10-CM

## 2019-10-08 DIAGNOSIS — G8929 Other chronic pain: Secondary | ICD-10-CM

## 2019-10-08 DIAGNOSIS — M5441 Lumbago with sciatica, right side: Secondary | ICD-10-CM

## 2019-10-08 MED ORDER — CITALOPRAM HYDROBROMIDE 20 MG PO TABS: 20.0000 mg | ORAL_TABLET | Freq: Every day | ORAL | 2 refills | Status: DC

## 2019-10-08 MED ORDER — DOXYCYCLINE HYCLATE 100 MG PO CAPS: 100.0000 mg | ORAL_CAPSULE | Freq: Two times a day (BID) | ORAL | 0 refills | Status: AC

## 2019-10-08 MED FILL — ?CITALOPRAM HBR 20 MG TABLE: 20 | 30 days supply | Qty: 30 | Fill #0

## 2019-10-08 MED FILL — ?DOXYCYCLINE HYCLATE 100 MG: 100 | 7 days supply | Qty: 14 | Fill #0

## 2019-10-08 NOTE — Patient Instructions (Addendum)
Chronic Back Pain When back pain lasts longer than 3 months, it is called chronic back pain. Pain may get worse at certain times (flare-ups). There are things you can do at home to manage your pain. Follow these instructions at home: Activity      Avoid bending and other activities that make pain worse.  When standing: ? Keep your upper back and neck straight. ? Keep your shoulders pulled back. ? Avoid slouching.  When sitting: ? Keep your back straight. ? Relax your shoulders. Do not round your shoulders or pull them backward.  Do not sit or stand in one place for long periods of time.  Take short rest breaks during the day. Lying down or standing is usually better than sitting. Resting can help relieve pain.  When sitting or lying down for a long time, do some mild activity or stretching. This will help to prevent stiffness and pain.  Get regular exercise. Ask your doctor what activities are safe for you.  Do not lift anything that is heavier than 10 lb (4.5 kg). To prevent injury when you lift things: ? Bend your knees. ? Keep the weight close to your body. ? Avoid twisting. Managing pain  If told, put ice on the painful area. Your doctor may tell you to use ice for 24-48 hours after a flare-up starts. ? Put ice in a plastic bag. ? Place a towel between your skin and the bag. ? Leave the ice on for 20 minutes, 2-3 times a day.  If told, put heat on the painful area as often as told by your doctor. Use the heat source that your doctor recommends, such as a moist heat pack or a heating pad. ? Place a towel between your skin and the heat source. ? Leave the heat on for 20-30 minutes. ? Remove the heat if your skin turns bright red. This is especially important if you are unable to feel pain, heat, or cold. You may have a greater risk of getting burned.  Soak in a warm bath. This can help relieve pain.  Take over-the-counter and prescription medicines only as told by your  doctor. General instructions  Sleep on a firm mattress. Try lying on your side with your knees slightly bent. If you lie on your back, put a pillow under your knees.  Keep all follow-up visits as told by your doctor. This is important. Contact a doctor if:  You have pain that does not get better with rest or medicine. Get help right away if:  One or both of your arms or legs feel weak.  One or both of your arms or legs lose feeling (numbness).  You have trouble controlling when you poop (bowel movement) or pee (urinate).  You feel sick to your stomach (nauseous).  You throw up (vomit).  You have belly (abdominal) pain.  You have shortness of breath.  You pass out (faint). Summary  When back pain lasts longer than 3 months, it is called chronic back pain.  Pain may get worse at certain times (flare-ups).  Use ice and heat as told by your doctor. Your doctor may tell you to use ice after flare-ups. This information is not intended to replace advice given to you by your health care provider. Make sure you discuss any questions you have with your health care provider. Document Revised: 06/29/2018 Document Reviewed: 10/21/2016   Major Depressive Disorder, Adult Major depressive disorder (MDD) is a mental health condition. MDD often makes  you feel sad, hopeless, or helpless. MDD can also cause symptoms in your body. MDD can affect your:  Work.  School.  Relationships.  Other normal activities. MDD can range from mild to very bad. It may occur once (single episode MDD). It can also occur many times (recurrent MDD). The main symptoms of MDD often include:  Feeling sad, depressed, or irritable most of the time.  Loss of interest. MDD symptoms also include:  Sleeping too much or too little.  Eating too much or too little.  A change in your weight.  Feeling tired (fatigue) or having low energy.  Feeling worthless.  Feeling guilty.  Trouble making  decisions.  Trouble thinking clearly.  Thoughts of suicide or harming others.  Feeling weak.  Feeling agitated.  Keeping yourself from being around other people (isolation). Follow these instructions at home: Activity  Do these things as told by your doctor: ? Go back to your normal activities. ? Exercise regularly. ? Spend time outdoors. Alcohol  Talk with your doctor about how alcohol can affect your antidepressant medicines.  Do not drink alcohol. Or, limit how much alcohol you drink. ? This means no more than 1 drink a day for nonpregnant women and 2 drinks a day for men. One drink equals one of these:  12 oz of beer.  5 oz of wine.  1 oz of hard liquor. General instructions  Take over-the-counter and prescription medicines only as told by your doctor.  Eat a healthy diet.  Get plenty of sleep.  Find activities that you enjoy. Make time to do them.  Think about joining a support group. Your doctor may be able to suggest a group for you.  Keep all follow-up visits as told by your doctor. This is important. Where to find more information:  The [employer] on Mental Illness: ? www.nami.org  [place] [employer] of Mental Health: ? http://www.maynard.net/  National Suicide Prevention Lifeline: ? 817 672 4209. This is free, 24-hour help. Contact a doctor if:  Your symptoms get worse.  You have new symptoms. Get help right away if:  You self-harm.  You see, hear, taste, smell, or feel things that are not present (hallucinate). If you ever feel like you may hurt yourself or others, or have thoughts about taking your own life, get help right away. You can go to your nearest emergency department or call:  Your local emergency services (911 in the [place]  A suicide crisis helpline, such as the National Suicide Prevention Lifeline: ? 346-334-6539. This is open 24 hours a day. This information is not intended to replace advice given to you by your  health care provider. Make sure you discuss any questions you have with your health care provider. Document Revised: 02/18/2017 Document Reviewed: 11/23/2015 Elsevier Patient Education  2020 [employer].

## 2019-10-08 NOTE — Progress Notes (Signed)
[facility] Patient [facility] [address] [place], [place]  [address] Phone:  [phone]   Fax:  [phone]   Established Patient Office Visit  Subjective:  Patient ID: Seth Snyder, male    DOB: [birth year]  Age: 59 y.o. MRN: [id number]  CC:  Chief Complaint  Patient presents with   Follow-up    needs documentation for social security    HPI Seth Snyder presents for follow up. He  has a past medical history of Anemia, Atrial fibrillation (HCC), Hypertension, and Low back pain.    He was a Chartered loss adjuster. He was working in the [employer] in [place]. He is not able to work at this time due to his Atrial fibrillation,neck arm and back pain. He is  cook, do laundry and is not able to shower on his own. He is not able do a lot of with his left arm.  Sometimes he is not able to get up out of bed by myself. He is currently living with his daughter but is not able to assist with a any bills.   Neck Pain Patient complains of neck pain.  He rates the pain a 9 out of 10 he admits that it varies.  Event that precipitated these symptoms: this is a longstanding problem which has been getting worse. Onset of symptoms 5 months ago, and have been gradually worsening since that time. Current symptoms are numbness in Right arm, paresthesias in Ring finger and weakness in Right upper arm pain.  Patient has had no prior neck problems. Previous treatments include: medication: analgesic: Tylenol, muscle relaxant: Cyclobenzaprine, And gabapentin.   Low Back Pain Patient complains of chronic low back pain. This is evaluated as a personal injury. The patient first noted symptoms several years ago. It was not related to no known injury. The pain is rated 8 /10, and is located at the across the lower back or radiating to right leg(s). The pain is described as burning and tingling and occurs intermittently. The symptoms admits to been progressive. Symptoms are exacerbated by nothing  in particular. Factors which relieve the pain include acetaminophen. Other associated symptoms include weakness in the right leg and burning pain in the right leg.  He admits that he has difficulty walking.  Previous history of symptoms: the problem is long-standing. Treatment efforts have included rest, acetaminophen and membrane stablizer, an interventional therapy with and without relief.  He did obtain the interventional therapy for 1 year and he repeated this every 3 months.  Depression Patient complains of depression. He complains of depressed mood and hopelessness. Onset was approximately several months ago. Symptoms have been gradually worsening since that time. Current symptoms include: anhedonia, fatigue, hopelessness, insomnia, recurrent thoughts of death and he feels darkness and cloudy and feels like he may die but denies thoughts of suicide. . Patient denies suicidal attempt, suicidal thoughts with specific plan and suicidal thoughts without plan.  Possible organic causes contributing are: health condition increased bills and no . Risk factors: none. Previous treatment includes medication. He complains of the following side effects from the treatment: none.  Drug use none Past Medical History:  Diagnosis Date   Anemia    Atrial fibrillation (HCC)    Hypertension    Low back pain     Past Surgical History:  Procedure Laterality Date   CARDIOVERSION N/A 05/10/2019   Procedure: CARDIOVERSION;  Surgeon: Lewayne Bunting, MD;  Location: [facility] ENDOSCOPY;  Service: Cardiovascular;  Laterality: N/A;  COLONOSCOPY  07/12/2013   LEFT HEART CATH AND CORONARY ANGIOGRAPHY N/A 08/30/2019   Procedure: LEFT HEART CATH AND CORONARY ANGIOGRAPHY;  Surgeon: Corky Crafts, MD;  Location: [facility] INVASIVE CV LAB;  Service: Cardiovascular;  Laterality: N/A;   TEE WITHOUT CARDIOVERSION N/A 05/10/2019   Procedure: TRANSESOPHAGEAL ECHOCARDIOGRAM (TEE);  Surgeon: Lewayne Bunting, MD;  Location: [facility]  ENDOSCOPY;  Service: Cardiovascular;  Laterality: N/A;    Family History  Problem Relation Age of Onset   Diabetes Mother    Diabetes Sister    Mental illness Sister    Mental illness Brother     Social History   Socioeconomic History   Marital status: Single    Spouse name: Not on file   Number of children: Not on file   Years of education: Not on file   Highest education level: Not on file  Occupational History   Not on file  Tobacco Use   Smoking status: Former Smoker    Quit date: 12/21/2018    Years since quitting: 0.7   Smokeless tobacco: Never Used  Substance and Sexual Activity   Alcohol use: Not Currently   Drug use: Never   Sexual activity: Not on file  Other Topics Concern   Not on file  Social History Narrative   Not on file   Social Determinants of Health   Financial Resource Strain:    Difficulty of Paying Living Expenses:   Food Insecurity: No Food Insecurity   Worried About Programme researcher, broadcasting/film/video in the Last Year: Never true   Ran Out of Food in the Last Year: Never true  Transportation Needs:    Freight forwarder (Medical):    Lack of Transportation (Non-Medical):   Physical Activity:    Days of Exercise per Week:    Minutes of Exercise per Session:   Stress:    Feeling of Stress :   Social Connections:    Frequency of Communication with Friends and Family:    Frequency of Social Gatherings with Friends and Family:    Attends Religious Services:    Active Member of Clubs or Organizations:    Attends Engineer, structural:    Marital Status:   Intimate Partner Violence:    Fear of Current or Ex-Partner:    Emotionally Abused:    Physically Abused:    Sexually Abused:     Outpatient Medications Prior to Visit  Medication Sig Dispense Refill   acetaminophen (TYLENOL) 500 MG tablet Take 500 mg by mouth daily as needed for mild pain.     amiodarone (PACERONE) 200 MG tablet Take 1 tablet by  mouth twice a day until 7/26 then reduce to 1 tablet a day 60 tablet 0   apixaban (ELIQUIS) 5 MG TABS tablet Take 1 tablet (5 mg total) by mouth 2 (two) times daily. 60 tablet 2   atorvastatin (LIPITOR) 40 MG tablet Take 1 tablet (40 mg total) by mouth daily at 6 PM. 90 tablet 3   gabapentin (NEURONTIN) 300 MG capsule TAKE 1 CAPSULE BY MOUTH EVERYDAY AT BEDTIME (Patient taking differently: Take 300 mg by mouth at bedtime. ) 30 capsule 1   hydrocortisone 1 % lotion Apply 1 application topically 2 (two) times daily. 118 mL 0   metoprolol succinate (TOPROL-XL) 100 MG 24 hr tablet Take 2 tablets (200 mg total) by mouth daily. Take with or immediately following a meal. 90 tablet 7   No facility-administered medications prior to visit.    No  Known Allergies  ROS Review of Systems  Neurological:       Burning in both leg and into the bottom of his foot L>R that is getting worse        Objective:    Physical Exam Constitutional:      General: He is not in acute distress.    Appearance: He is normal weight. He is not ill-appearing or toxic-appearing.  HENT:     Head: Normocephalic and atraumatic.     Nose: Nose normal.     Mouth/Throat:     Mouth: Mucous membranes are moist.  Cardiovascular:     Rate and Rhythm: Normal rate. Rhythm irregular.     Pulses: Normal pulses.  Pulmonary:     Effort: Pulmonary effort is normal.     Breath sounds: Normal breath sounds.  Abdominal:     Palpations: Abdomen is soft.  Musculoskeletal:        General: Tenderness present. No swelling or deformity.     Cervical back: Normal range of motion.     Right lower leg: No edema.     Left lower leg: No edema.     Comments: Cane in use Support socks in use  Skin:    General: Skin is warm.     Capillary Refill: Capillary refill takes less than 2 seconds.     Findings: Erythema, lesion and rash present.     Comments: Right posterior arm and right upper back  Neurological:     General: No focal  deficit present.     Mental Status: He is alert and oriented to person, place, and time.  Psychiatric:        Mood and Affect: Mood normal.        Behavior: Behavior normal.        Thought Content: Thought content normal.        Judgment: Judgment normal.     BP (!) 158/92 (BP Location: Left Arm, Patient Position: Sitting, Cuff Size: Normal) Comment: manually   Pulse 66    Temp 98.6 F (37 C) (Skin)    Resp 16    Ht 5' 7.5" (1.715 m)    Wt 157 lb (71.2 kg)    SpO2 96%    BMI 24.23 kg/m  Wt Readings from Last 3 Encounters:  10/08/19 157 lb (71.2 kg)  09/26/19 158 lb (71.7 kg)  09/25/19 151 lb (68.5 kg)     Health Maintenance Due  Topic Date Due   Hepatitis C Screening  Never done   COLONOSCOPY  Never done    There are no preventive care reminders to display for this patient.  Lab Results  Component Value Date   TSH 1.496 09/20/2019   Lab Results  Component Value Date   WBC 7.9 09/26/2019   HGB 13.5 09/26/2019   HCT 43.0 09/26/2019   MCV 88.3 09/26/2019   PLT 212 09/26/2019   Lab Results  Component Value Date   NA 137 09/26/2019   K 4.9 09/26/2019   CO2 22 09/26/2019   GLUCOSE 102 (H) 09/26/2019   BUN 13 09/26/2019   CREATININE 1.31 (H) 09/26/2019   BILITOT 3.0 (H) 09/20/2019   ALKPHOS 100 09/20/2019   AST 41 09/20/2019   ALT 33 09/20/2019   PROT 7.9 09/20/2019   ALBUMIN 4.2 09/20/2019   CALCIUM 9.2 09/26/2019   ANIONGAP 10 09/26/2019   Lab Results  Component Value Date   CHOL 94 (L) 08/15/2019   Lab Results  Component  Value Date   HDL 41 08/15/2019   Lab Results  Component Value Date   LDLCALC 35 08/15/2019   Lab Results  Component Value Date   TRIG 93 08/15/2019   Lab Results  Component Value Date   CHOLHDL 2.3 08/15/2019   No results found for: HGBA1C    Assessment & Plan:   Problem List Items Addressed This Visit      Cardiovascular and Mediastinum   Atrial fibrillation with RVR (HCC) Continue with current regimen Continue to  follow with cardiology Plan cardioversion scheduled     Other   Spinal stenosis of cervical region - Primary   Relevant Orders   Ambulatory referral to Physical Therapy for functional capacity evaluation to further assist with disability limitations Continue to follow with orthopedist once atrial fibrillation is stable for surgical intervention    Other Visit Diagnoses    Chronic midline low back pain with bilateral sciatica       Relevant Medications   citalopram (CELEXA) 20 MG tablet Continue to follow with orthopedist for further intervention    Other Relevant Orders   Ambulatory referral to Physical Therapy   Depression, unspecified depression type       Relevant Medications   citalopram (CELEXA) 20 MG tablet Education provided on depression as it relates to chronic illnesses Will encourage counseling as patient becomes more receptive   Abscess   Patient education provided on possible MRSA infection since cyst/abscess are spreading      Meds ordered this encounter  Medications   citalopram (CELEXA) 20 MG tablet    Sig: Take 1 tablet (20 mg total) by mouth daily.    Dispense:  30 tablet    Refill:  2    Order Specific Question:   Supervising Provider    Answer:   Quentin Angst [[id number]]   doxycycline (VIBRAMYCIN) 100 MG capsule    Sig: Take 1 capsule (100 mg total) by mouth 2 (two) times daily for 7 days.    Dispense:  14 capsule    Refill:  0    Order Specific Question:   Supervising Provider    Answer:   Quentin Angst [[id number]]    Follow-up: Return for Appointment As Scheduled.    Barbette Merino, NP

## 2019-10-09 ENCOUNTER — Ambulatory Visit (INDEPENDENT_AMBULATORY_CARE_PROVIDER_SITE_OTHER): Payer: Medicaid Other

## 2019-10-09 ENCOUNTER — Ambulatory Visit (INDEPENDENT_AMBULATORY_CARE_PROVIDER_SITE_OTHER): Payer: Medicaid Other | Admitting: Orthopaedic Surgery

## 2019-10-09 ENCOUNTER — Encounter: Payer: Self-pay | Admitting: Orthopaedic Surgery

## 2019-10-09 ENCOUNTER — Other Ambulatory Visit: Payer: Self-pay

## 2019-10-09 VITALS — BP 161/92 | HR 60 | Ht 67.5 in | Wt 157.0 lb

## 2019-10-09 DIAGNOSIS — M545 Low back pain, unspecified: Secondary | ICD-10-CM

## 2019-10-09 DIAGNOSIS — G8929 Other chronic pain: Secondary | ICD-10-CM

## 2019-10-09 DIAGNOSIS — M25551 Pain in right hip: Secondary | ICD-10-CM

## 2019-10-09 MED ORDER — APIXABAN 5 MG PO TABS: 5.0000 mg | ORAL_TABLET | Freq: Two times a day (BID) | ORAL | 2 refills | Status: DC

## 2019-10-09 NOTE — Progress Notes (Signed)
Office Visit Note   Patient: Seth Snyder           Date of Birth: 09-03-1960           MRN: [id number] Visit Date: 10/09/2019              Requested by: Barbette Merino, NP [address] [place],  [place] [address] PCP: Barbette Merino, NP   Assessment & Plan: Visit Diagnoses:  1. Chronic bilateral low back pain, unspecified whether sciatica present   2. Pain in right hip     Plan: Patient has more neck problems and back problems.  He does have lumbar spondylosis with disc space narrowing and endplate reactions at L2-3.  He has cardioversion coming up currently is not a surgical candidate.  If his heart function improves and cardiology gives him the approval then cervical surgery could be considered.  I plan to recheck him in 6 weeks.  Follow-Up Instructions: Return in about 6 weeks (around 11/20/2019).   Orders:  Orders Placed This Encounter  Procedures  . XR Lumbar Spine 2-3 Views  . XR HIP UNILAT W OR W/O PELVIS 2-3 VIEWS RIGHT   No orders of the defined types were placed in this encounter.     Procedures: No procedures performed   Clinical Data: No additional findings.   Subjective: Chief Complaint  Patient presents with  . Neck - Follow-up  . Right Hip - Pain  . Lower Back - Pain    HPI 59 year old male from [place] here with a translator with cervical spondylosis and cervical stenosis.  He is in atrial fib on Eliquis and has cardioversion scheduled in 6 days.  Cardiac catheterization in June showed severe left ventricular systolic dysfunction ejection fraction less than 25% by visual estimation.  Previously he was in the 40 to 45% range. Patient has ongoing problems with back pain leg numbness and tingling.  He has numbness and tingling in his right foot instep towards his right foot then brings his left foot up and then repeats.  Slow stride gait he is using a 4 pronged cane. Review of Systems updated unchanged from last visit.   Objective: Vital  Signs: BP (!) 161/92   Pulse 60   Ht 5' 7.5" (1.715 m)   Wt 157 lb (71.2 kg)   BMI 24.23 kg/m   Physical Exam Constitutional:      Appearance: He is well-developed.     Comments: Chronic ill appearance.  HENT:     Head: Normocephalic and atraumatic.  Eyes:     Pupils: Pupils are equal, round, and reactive to light.  Neck:     Thyroid: No thyromegaly.     Trachea: No tracheal deviation.  Cardiovascular:     Rate and Rhythm: Normal rate.  Pulmonary:     Effort: Pulmonary effort is normal.     Breath sounds: No wheezing.  Abdominal:     General: Bowel sounds are normal.     Palpations: Abdomen is soft.  Skin:    General: Skin is warm and dry.     Capillary Refill: Capillary refill takes less than 2 seconds.  Neurological:     Mental Status: He is alert and oriented to person, place, and time.  Psychiatric:        Behavior: Behavior normal.        Thought Content: Thought content normal.        Judgment: Judgment normal.     Ortho Exam  positive straight leg raising on the right 60 degrees.  Negative logroll to the hips.  Anterior tib gastrocsoleus is intact with isolated motor testing. Specialty Comments:  No specialty comments available.  Imaging: No results found.   PMFS History: Patient Active Problem List   Diagnosis Date Noted  . Persistent atrial fibrillation (HCC) 09/20/2019  . Secondary hypercoagulable state (HCC) 09/20/2019  . Abnormal nuclear stress test   . Spinal stenosis of cervical region 08/13/2019  . Protrusion of cervical intervertebral disc 08/13/2019  . Shoulder pain   . Chronic combined systolic and diastolic heart failure (HCC)   . Atrial fibrillation with RVR (HCC) 05/07/2019  . Hypertension 05/07/2019  . Hypothyroidism 05/07/2019  . Hypomagnesemia 05/07/2019  . Elevated troponin 05/07/2019  . Acute pulmonary edema (HCC)    Past Medical History:  Diagnosis Date  . Anemia   . Atrial fibrillation (HCC)   . Hypertension   . Low back  pain     Family History  Problem Relation Age of Onset  . Diabetes Mother   . Diabetes Sister   . Mental illness Sister   . Mental illness Brother     Past Surgical History:  Procedure Laterality Date  . CARDIOVERSION N/A 05/10/2019   Procedure: CARDIOVERSION;  Surgeon: Lewayne Bunting, MD;  Location[facility] ENDOSCOPY;  Service: Cardiovascular;  Laterality: N/A;  . COLONOSCOPY  07/12/2013  . LEFT HEART CATH AND CORONARY ANGIOGRAPHY N/A 08/30/2019   Procedure: LEFT HEART CATH AND CORONARY ANGIOGRAPHY;  Surgeon: Corky Crafts, MD;  Location: [facility] INVASIVE CV LAB;  Service: Cardiovascular;  Laterality: N/A;  . TEE WITHOUT CARDIOVERSION N/A 05/10/2019   Procedure: TRANSESOPHAGEAL ECHOCARDIOGRAM (TEE);  Surgeon: Lewayne Bunting, MD;  Location: [facility] ENDOSCOPY;  Service: Cardiovascular;  Laterality: N/A;   Social History   Occupational History  . Not on file  Tobacco Use  . Smoking status: Former Smoker    Quit date: 12/21/2018    Years since quitting: 0.8  . Smokeless tobacco: Never Used  Substance and Sexual Activity  . Alcohol use: Not Currently  . Drug use: Never  . Sexual activity: Not on file

## 2019-10-10 ENCOUNTER — Ambulatory Visit: Payer: Self-pay | Admitting: Orthopaedic Surgery

## 2019-10-12 ENCOUNTER — Other Ambulatory Visit (HOSPITAL_COMMUNITY)
Admission: RE | Admit: 2019-10-12 | Discharge: 2019-10-12 | Disposition: A | Discharge status: Home / Self Care | Payer: Medicaid Other | Source: Ambulatory Visit | Attending: Cardiovascular Disease | Admitting: Cardiovascular Disease

## 2019-10-12 ENCOUNTER — Telehealth (HOSPITAL_COMMUNITY): Payer: Self-pay | Admitting: Licensed Clinical Social Worker

## 2019-10-12 DIAGNOSIS — Z01812 Encounter for preprocedural laboratory examination: Secondary | ICD-10-CM | POA: Diagnosis present

## 2019-10-12 DIAGNOSIS — Z20822 Contact with and (suspected) exposure to covid-19: Secondary | ICD-10-CM | POA: Insufficient documentation

## 2019-10-12 LAB — SARS CORONAVIRUS 2 (TAT 6-24 HRS): SARS Coronavirus 2: NEGATIVE

## 2019-10-12 NOTE — Telephone Encounter (Signed)
Pt came to clinic reception and asked to speak with CSW.   CSW met with pt with help from phone interpreter.  Pt had brought several papers for CSW to review and help with.  CSW reviewed papers which were from [facility] and informed him he had been approved for Medicaid until 05/19/20.  There was another notice that asked for proof that pt had applied for disability (just had phone application on July 31DV so no paperwork yet).  CSW assisted pt in calling his [employer] case worker Christell Faith at [phone] to discuss.  She stated that she could accept a signed letter from pt that stated he had started the process for disability.  CSW typed up and had pt sign.  Also had pt sign Medicaid transportation form and provided him with Medicaid transportation number and explained he needed to call 3 days in advance to set up transport.  Pt then inquired about several bills that had gone to collections.  CSW helped pt call Financial counseling department at [facility] and discuss with them regarding his bill.  Pt medicaid only retroactively paid until 05/21/19 so he still had a large hospital bill from February which is now in collections.  Pt had attempted to apply for CAFA but hadn't been in [place] long enough to apply.  CSW inquired if he could now apply again since he has lived here more than 3 months at this time.  Told by representative that they would not do this for pt as he had not tried to set up payment with them prior to it going to collections- have message into manager to confirm this decision.  CSW then assisted in calling the collections agency directly.  Provided them with Medicaid information to help with bills past March- they will bill Medicaid and adjust the owed amount based off their payment.  Pt will have to pay the remainder, however, and they will continue to call and send notices until it is paid.  CSW explained this to pt but representative assured [place] that his amount owed would not have interest applied to it  so the amount would remain stable and when he starts drawing disability he can pay as he is able to reduce the debt- explained all this to pt who expressed understanding.  No further needs at this time- CSW will continue to follow and assist as needed  Jorge Ny, [facility] Desk#: [phone] Cell#: [phone]

## 2019-10-15 ENCOUNTER — Other Ambulatory Visit (HOSPITAL_COMMUNITY): Payer: Self-pay | Admitting: *Deleted

## 2019-10-15 ENCOUNTER — Encounter (HOSPITAL_COMMUNITY): Payer: Self-pay | Admitting: Certified Registered"

## 2019-10-15 ENCOUNTER — Other Ambulatory Visit: Payer: Self-pay | Admitting: Cardiovascular Disease

## 2019-10-15 ENCOUNTER — Ambulatory Visit (HOSPITAL_COMMUNITY)
Admission: RE | Admit: 2019-10-15 | Discharge: 2019-10-15 | Disposition: A | Discharge status: Home / Self Care | Payer: Medicaid Other | Attending: Cardiovascular Disease | Admitting: Cardiovascular Disease

## 2019-10-15 ENCOUNTER — Other Ambulatory Visit: Payer: Self-pay | Admitting: Nurse Practitioner

## 2019-10-15 ENCOUNTER — Encounter (HOSPITAL_COMMUNITY)
Admission: RE | Disposition: A | Discharge status: Home / Self Care | Payer: Self-pay | Source: Home / Self Care | Attending: Cardiovascular Disease

## 2019-10-15 ENCOUNTER — Other Ambulatory Visit: Payer: Self-pay

## 2019-10-15 DIAGNOSIS — Z538 Procedure and treatment not carried out for other reasons: Secondary | ICD-10-CM | POA: Diagnosis not present

## 2019-10-15 DIAGNOSIS — I4891 Unspecified atrial fibrillation: Secondary | ICD-10-CM | POA: Diagnosis present

## 2019-10-15 DIAGNOSIS — F329 Major depressive disorder, single episode, unspecified: Secondary | ICD-10-CM | POA: Insufficient documentation

## 2019-10-15 DIAGNOSIS — G8929 Other chronic pain: Secondary | ICD-10-CM | POA: Insufficient documentation

## 2019-10-15 DIAGNOSIS — Z79899 Other long term (current) drug therapy: Secondary | ICD-10-CM | POA: Insufficient documentation

## 2019-10-15 DIAGNOSIS — L0291 Cutaneous abscess, unspecified: Secondary | ICD-10-CM | POA: Diagnosis not present

## 2019-10-15 DIAGNOSIS — I10 Essential (primary) hypertension: Secondary | ICD-10-CM | POA: Insufficient documentation

## 2019-10-15 DIAGNOSIS — M545 Low back pain: Secondary | ICD-10-CM | POA: Diagnosis not present

## 2019-10-15 DIAGNOSIS — Z7901 Long term (current) use of anticoagulants: Secondary | ICD-10-CM | POA: Diagnosis not present

## 2019-10-15 SURGERY — CANCELLED PROCEDURE

## 2019-10-15 MED ORDER — METOPROLOL SUCCINATE ER 100 MG PO TB24: 200.0000 mg | ORAL_TABLET | Freq: Every day | ORAL | 3 refills | Status: DC

## 2019-10-15 MED FILL — ELIQUIS 5 MG TABLET: 5 | 30 days supply | Qty: 60 | Fill #0

## 2019-10-15 MED FILL — METOPROLOL SUCCINATE ER 100: 100 | 90 days supply | Qty: 180 | Fill #0

## 2019-10-15 NOTE — Progress Notes (Signed)
Patient presented today for cardioversion.  Patient in sinus rhythm.  EKG done and case cancelled.

## 2019-10-15 NOTE — Telephone Encounter (Signed)
*  STAT* If patient is at the pharmacy, call can be transferred to refill team.   1. Which medications need to be refilled? (please list name of each medication and dose if known)  metoprolol succinate (TOPROL-XL) 100 MG 24 hr tablet  2. Which pharmacy/location (including street and city if local pharmacy) is medication to be sent to? [facility] [place] [place] - [place] [address]  3. Do they need a 30 day or 90 day supply? 90 day supply    Patient is at pharmacy now.

## 2019-10-19 ENCOUNTER — Telehealth (HOSPITAL_COMMUNITY): Payer: Self-pay | Admitting: Licensed Clinical Social Worker

## 2019-10-19 MED FILL — ATORVASTATIN CALCIUM 40 MG: 40 | 30 days supply | Qty: 30 | Fill #2

## 2019-10-19 NOTE — Telephone Encounter (Signed)
Pt came to clinic and requested to see CSW.  Brought disability paperwork with him- CSW assisted him in filling out Work history report and faxed it to [employer]- fax confirmation received  CSW also assisted pt in setting up Medicaid transport for A-fib clinic appt Monday  Will continue to follow and assist as needed  Burna Sis, LCSW Clinical Social Worker [facility] Desk#: [phone] Cell#: [phone]

## 2019-10-22 ENCOUNTER — Ambulatory Visit (HOSPITAL_COMMUNITY)
Admission: RE | Admit: 2019-10-22 | Discharge: 2019-10-22 | Disposition: A | Discharge status: Home / Self Care | Payer: Medicaid Other | Source: Ambulatory Visit | Attending: Physician Assistant | Admitting: Physician Assistant

## 2019-10-22 ENCOUNTER — Other Ambulatory Visit: Payer: Self-pay

## 2019-10-22 ENCOUNTER — Ambulatory Visit (HOSPITAL_COMMUNITY): Payer: Self-pay | Admitting: Physician Assistant

## 2019-10-22 VITALS — BP 164/96 | HR 48 | Ht 67.5 in | Wt 154.6 lb

## 2019-10-22 DIAGNOSIS — M5020 Other cervical disc displacement, unspecified cervical region: Secondary | ICD-10-CM | POA: Diagnosis not present

## 2019-10-22 DIAGNOSIS — D6869 Other thrombophilia: Secondary | ICD-10-CM | POA: Diagnosis not present

## 2019-10-22 DIAGNOSIS — Z7901 Long term (current) use of anticoagulants: Secondary | ICD-10-CM | POA: Insufficient documentation

## 2019-10-22 DIAGNOSIS — I4510 Unspecified right bundle-branch block: Secondary | ICD-10-CM | POA: Insufficient documentation

## 2019-10-22 DIAGNOSIS — Z87891 Personal history of nicotine dependence: Secondary | ICD-10-CM | POA: Insufficient documentation

## 2019-10-22 DIAGNOSIS — I110 Hypertensive heart disease with heart failure: Secondary | ICD-10-CM | POA: Diagnosis not present

## 2019-10-22 DIAGNOSIS — I4819 Other persistent atrial fibrillation: Secondary | ICD-10-CM | POA: Diagnosis present

## 2019-10-22 DIAGNOSIS — Z79899 Other long term (current) drug therapy: Secondary | ICD-10-CM | POA: Diagnosis not present

## 2019-10-22 DIAGNOSIS — I5042 Chronic combined systolic (congestive) and diastolic (congestive) heart failure: Secondary | ICD-10-CM | POA: Diagnosis not present

## 2019-10-22 DIAGNOSIS — R001 Bradycardia, unspecified: Secondary | ICD-10-CM | POA: Diagnosis not present

## 2019-10-22 MED ORDER — AMIODARONE HCL 200 MG PO TABS: 200.0000 mg | ORAL_TABLET | Freq: Every day | ORAL | 3 refills | Status: DC

## 2019-10-22 MED FILL — AMIODARONE HCL 200 MG TAB: 200 | 36 days supply | Qty: 60 | Fill #0

## 2019-10-22 MED FILL — ATORVASTATIN CALCIUM 40 MG: 40 | 90 days supply | Qty: 90 | Fill #2

## 2019-10-22 NOTE — Patient Instructions (Signed)
Decrease amiodarone to 200mg once a day 

## 2019-10-22 NOTE — Progress Notes (Signed)
Primary Care Physician: Barbette Merino, NP Primary Cardiologist: Dr Duke Salvia Primary Electrophysiologist: none Referring Physician: Azalee Course PA   Seth Snyder is a 59 y.o. male with a history of atrial fibrillation, hypertension, and combined systolic and diastolic heart failure who presents for follow up in the [facility]. He was first diagnosed with atrial fibrillation/atrial flutter in [facility] in November 2020.  He was admitted in November 2020 to [facility] for hypertensive urgency and A. Fib.  He was started on Eliquis, aspirin and metoprolol.  After he moved to [place], he was admitted to the hospital in February 2021 with A. fib with RVR.  Echocardiogram at the time showed EF 45 to 50%, indeterminate diastolic function, mild MR, mild AI, mild to moderate TR.  He underwent TEE-DCCVx2 on 05/10/2019.  EF was 30 to 35% on TEE.  Metoprolol was increased to 150 mg daily. Thyroid function was mildly abnormal, therefore amiodarone was avoided.  Myoview obtained on 05/23/2019 showed fixed defect at the apex however reversible inferior defect suggest ischemia.  Cardiac catheterization was recommended.  By the time patient was seen by Edd Fabian on 08/27/2019, EKG shows he has went back into atrial fibrillation. Cardiac catheterization performed on 08/30/2019 showed EF less than 25% by visual estimate, LVEDP 12 mmHg, no significant CAD or aortic valve stenosis. 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 Patient is on Eliquis for a CHADS2VASC score of 2. Patient reports he is unaware of his arrhythmia despite RVR. No h/o significant snoring or alcohol use.   On follow up today, patient presented for DCCV on 10/15/19 but was in SR. He reports that he feels improved from a cardiac standpoint with less SOB. His primary concern today is regarding his cervical radiculopathy and hip pain. He is followed by Dr Ophelia Charter.   Today,  he denies symptoms of palpitations, chest pain, shortness of breath, orthopnea, PND, lower extremity edema, dizziness, presyncope, syncope, snoring, daytime somnolence, bleeding, or neurologic sequela. The patient is tolerating medications without difficulties and is otherwise without complaint today.    Atrial Fibrillation Risk Factors:  he does not have symptoms or diagnosis of sleep apnea. he does not have a history of rheumatic fever. he does not have a history of alcohol use. The patient does not have a history of early familial atrial fibrillation or other arrhythmias.  he has a BMI of Body mass index is 23.86 kg/m.Marland Kitchen Filed Weights   10/22/19 1512  Weight: 70.1 kg    Family History  Problem Relation Age of Onset   Diabetes Mother    Diabetes Sister    Mental illness Sister    Mental illness Brother      Atrial Fibrillation Management history:  Previous antiarrhythmic drugs: amiodarone Previous cardioversions: 05/10/19 Previous ablations: none CHADS2VASC score: 2 Anticoagulation history: Eliquis   Past Medical History:  Diagnosis Date   Anemia    Atrial fibrillation (HCC)    Hypertension    Low back pain    Past Surgical History:  Procedure Laterality Date   CARDIOVERSION N/A 05/10/2019   Procedure: CARDIOVERSION;  Surgeon: Lewayne Bunting, MD;  Location: [facility] ENDOSCOPY;  Service: Cardiovascular;  Laterality: N/A;   COLONOSCOPY  07/12/2013   LEFT HEART CATH AND CORONARY ANGIOGRAPHY N/A 08/30/2019   Procedure: LEFT HEART CATH AND CORONARY ANGIOGRAPHY;  Surgeon: Corky Crafts, MD;  Location: [facility] INVASIVE CV LAB;  Service: Cardiovascular;  Laterality: N/A;   TEE WITHOUT CARDIOVERSION  N/A 05/10/2019   Procedure: TRANSESOPHAGEAL ECHOCARDIOGRAM (TEE);  Surgeon: Lewayne Bunting, MD;  Location: [facility] ENDOSCOPY;  Service: Cardiovascular;  Laterality: N/A;    Current Outpatient Medications  Medication Sig Dispense Refill   acetaminophen (TYLENOL) 500 MG  tablet Take 500 mg by mouth daily as needed for mild pain.     amiodarone (PACERONE) 200 MG tablet Take 1 tablet (200 mg total) by mouth daily. 30 tablet 3   atorvastatin (LIPITOR) 40 MG tablet Take 1 tablet (40 mg total) by mouth daily at 6 PM. 90 tablet 3   citalopram (CELEXA) 20 MG tablet Take 1 tablet (20 mg total) by mouth daily. 30 tablet 2   ELIQUIS 5 MG TABS tablet TAKE 1 TABLET (5 MG TOTAL) BY MOUTH 2 (TWO) TIMES DAILY. 60 tablet 2   gabapentin (NEURONTIN) 300 MG capsule TAKE 1 CAPSULE BY MOUTH EVERYDAY AT BEDTIME (Patient taking differently: Take 300 mg by mouth at bedtime. ) 30 capsule 1   metoprolol succinate (TOPROL-XL) 100 MG 24 hr tablet Take 2 tablets (200 mg total) by mouth daily. Take with or immediately following a meal. 180 tablet 3   No current facility-administered medications for this encounter.    No Known Allergies  Social History   Socioeconomic History   Marital status: Single    Spouse name: Not on file   Number of children: Not on file   Years of education: Not on file   Highest education level: Not on file  Occupational History   Not on file  Tobacco Use   Smoking status: Former Smoker    Quit date: 12/21/2018    Years since quitting: 0.8   Smokeless tobacco: Never Used  Substance and Sexual Activity   Alcohol use: Not Currently   Drug use: Never   Sexual activity: Not on file  Other Topics Concern   Not on file  Social History Narrative   Not on file   Social Determinants of Health   Financial Resource Strain:    Difficulty of Paying Living Expenses:   Food Insecurity: No Food Insecurity   Worried About Programme researcher, broadcasting/film/video in the Last Year: Never true   Ran Out of Food in the Last Year: Never true  Transportation Needs:    Freight forwarder (Medical):    Lack of Transportation (Non-Medical):   Physical Activity:    Days of Exercise per Week:    Minutes of Exercise per Session:   Stress:    Feeling of  Stress :   Social Connections:    Frequency of Communication with Friends and Family:    Frequency of Social Gatherings with Friends and Family:    Attends Religious Services:    Active Member of Clubs or Organizations:    Attends Engineer, structural:    Marital Status:   Intimate Partner Violence:    Fear of Current or Ex-Partner:    Emotionally Abused:    Physically Abused:    Sexually Abused:      ROS- All systems are reviewed and negative except as per the HPI above.  Physical Exam: Vitals:   10/22/19 1512  BP: (!) 164/96  Pulse: (!) 48  Weight: 70.1 kg  Height: 5' 7.5" (1.715 m)    GEN- The patient is well appearing, alert and oriented x 3 today.   HEENT-head normocephalic, atraumatic, sclera clear, conjunctiva pink, hearing intact, trachea midline. Lungs- Clear to ausculation bilaterally, normal work of breathing Heart- Regular rate and rhythm, bradycardia,  no murmurs, rubs or gallops  GI- soft, NT, ND, + BS Extremities- no clubbing, cyanosis, or edema MS- no significant deformity or atrophy Skin- no rash or lesion Psych- euthymic mood, full affect Neuro- strength and sensation are intact   Wt Readings from Last 3 Encounters:  10/22/19 70.1 kg  10/09/19 71.2 kg  10/08/19 71.2 kg    EKG today demonstrates SB HR 48, PR 172, QRS 94, QTc 441  Echo 05/07/19 demonstrated  1. Left ventricular ejection fraction, by estimation, is 45 to 50%. The  left ventricle has mildly decreased function. The left ventricle  demonstrates global hypokinesis. Left ventricular diastolic function could  not be evaluated.  2. Right ventricular systolic function is low normal. The right  ventricular size is normal. There is mildly elevated pulmonary artery  systolic pressure.  3. The mitral valve is grossly normal. Mild mitral valve regurgitation.  4. Tricuspid valve regurgitation is mild to moderate.  5. The aortic valve is tricuspid. Aortic valve  regurgitation is mild. No  aortic stenosis is present.  6. There is mild dilatation of the ascending aorta measuring 39 mm.  7. The inferior vena cava is dilated in size with >50% respiratory  variability, suggesting right atrial pressure of 8 mmHg.   LHC 08/30/19  There is severe left ventricular systolic dysfunction.  The left ventricular ejection fraction is less than 25% by visual estimate.  LV end diastolic pressure is normal. LVEDP 12 mm Hg.  There is no aortic valve stenosis.  No significant CAD.  Severe vasospasm of right radial artery, even with 4 Fr catheter. Would not use the right radial artery if cath was needed in the future.   Epic records are reviewed at length today  CHA2DS2-VASc Score = 2  The patient's score is based upon: CHF History: 1 HTN History: 1 Age : 0 Diabetes History: 0 Stroke History: 0 Vascular Disease History: 0 Gender: 0      ASSESSMENT AND PLAN: 1. Persistent Atrial Fibrillation (ICD10:  I48.19) The patient's CHA2DS2-VASc score is 2, indicating a 2.2% annual risk of stroke.   Patient converted to SR on medication. Decrease amiodarone to 200 mg daily Patient has been approved for Medicaid. Will refer to EP to consider ablation given his h/o tachycardia mediated CM and young age. Continue Eliquis 5 mg BID. (Resumed 08/31/19 after LHC) Continue Toprol 100 mg BID  2. Secondary Hypercoagulable State (ICD10:  D68.69) The patient is at significant risk for stroke/thromboembolism based upon his CHA2DS2-VASc Score of 2.  Continue Apixaban (Eliquis).   3.HTN Elevated today. Patient having significant neck and hip pain.  no changes today.  4. 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 No signs or symptoms of fluid overload.  5. Neck Pain/Cervical intervertebral disc protrusion Patient's activity limited by pain. Followed by Dr Ophelia Charter.  6. Bradycardia Patient appears asymptomatic. Decrease amiodarone  as above.   Follow up with EP to consider ablation.    [name] PA-C [facility] [address] [place], [place] [address] [phone] 10/22/2019 4:00 PM

## 2019-10-30 ENCOUNTER — Institutional Professional Consult (permissible substitution): Payer: Medicaid Other | Admitting: Internal Medicine

## 2019-11-07 MED FILL — ?CITALOPRAM HBR 20 MG TABLE: 20 | 30 days supply | Qty: 30 | Fill #1

## 2019-11-15 ENCOUNTER — Other Ambulatory Visit: Payer: Self-pay | Admitting: Nurse Practitioner

## 2019-11-15 ENCOUNTER — Ambulatory Visit: Payer: Self-pay | Admitting: Nurse Practitioner

## 2019-11-15 MED FILL — ELIQUIS 5 MG TABLET: 5 | 30 days supply | Qty: 60 | Fill #1

## 2019-11-16 ENCOUNTER — Ambulatory Visit (INDEPENDENT_AMBULATORY_CARE_PROVIDER_SITE_OTHER): Payer: Medicaid Other | Admitting: Internal Medicine

## 2019-11-16 ENCOUNTER — Encounter: Payer: Self-pay | Admitting: Internal Medicine

## 2019-11-16 ENCOUNTER — Other Ambulatory Visit: Payer: Self-pay

## 2019-11-16 VITALS — BP 160/86 | HR 58 | Ht 67.5 in | Wt 157.0 lb

## 2019-11-16 DIAGNOSIS — I4819 Other persistent atrial fibrillation: Secondary | ICD-10-CM | POA: Diagnosis not present

## 2019-11-16 DIAGNOSIS — D6869 Other thrombophilia: Secondary | ICD-10-CM | POA: Diagnosis not present

## 2019-11-16 DIAGNOSIS — I5042 Chronic combined systolic (congestive) and diastolic (congestive) heart failure: Secondary | ICD-10-CM

## 2019-11-16 DIAGNOSIS — M542 Cervicalgia: Secondary | ICD-10-CM

## 2019-11-16 DIAGNOSIS — I428 Other cardiomyopathies: Secondary | ICD-10-CM | POA: Diagnosis not present

## 2019-11-16 NOTE — Progress Notes (Signed)
Electrophysiology Office Note   Date:  11/16/2019   ID:  Seth Snyder, Seth Snyder 07-06-1960, MRN [id number]  PCP:  Seth Merino, NP  Cardiologist:  Dr Duke Salvia Primary Electrophysiologist: Seth Range, MD    CC: afib   History of Present Illness: Seth Snyder is a 59 y.o. male who presents today for electrophysiology evaluation.  He is referred by Dr Duke Salvia and Seth Snyder in the AF clinic.  He does not speak english and is accompanied by a translator today.  He was initialy diagnosed with afib 11/20 in [place] afte presenting with tachypalpitations in the setting of elevated BP.  He has been evaluated in the AF clinic and started on eliquis. EF 40-45%.  Cath reveals normal cors.  He has undergone TEE guided cardioversion 2/21. He returned to afib and has required amiodarone therapy.  He has frequent severe neck and back pain with radicular symptoms which is his primary concern. Today, he denies symptoms of palpitations, chest pain, shortness of breath, orthopnea, PND, lower extremity edema, claudication, dizziness, presyncope, syncope, bleeding, or neurologic sequela. The patient is tolerating medications without difficulties and is otherwise without complaint today.    Past Medical History:  Diagnosis Date  . Anemia   . Atrial fibrillation (HCC)   . Hypertension   . Low back pain    Past Surgical History:  Procedure Laterality Date  . CARDIOVERSION N/A 05/10/2019   Procedure: CARDIOVERSION;  Surgeon: Seth Bunting, MD;  Location: [facility] ENDOSCOPY;  Service: Cardiovascular;  Laterality: N/A;  . COLONOSCOPY  07/12/2013  . LEFT HEART CATH AND CORONARY ANGIOGRAPHY N/A 08/30/2019   Procedure: LEFT HEART CATH AND CORONARY ANGIOGRAPHY;  Surgeon: Seth Crafts, MD;  Location: [facility] INVASIVE CV LAB;  Service: Cardiovascular;  Laterality: N/A;  . TEE WITHOUT CARDIOVERSION N/A 05/10/2019   Procedure: TRANSESOPHAGEAL ECHOCARDIOGRAM (TEE);  Surgeon: Seth Bunting, MD;   Location: [facility] ENDOSCOPY;  Service: Cardiovascular;  Laterality: N/A;     Current Outpatient Medications  Medication Sig Dispense Refill  . acetaminophen (TYLENOL) 500 MG tablet Take 500 mg by mouth daily as needed for mild pain.    Marland Kitchen amiodarone (PACERONE) 200 MG tablet Take 1 tablet (200 mg total) by mouth daily. 30 tablet 3  . atorvastatin (LIPITOR) 40 MG tablet Take 1 tablet (40 mg total) by mouth daily at 6 PM. 90 tablet 3  . citalopram (CELEXA) 20 MG tablet Take 1 tablet (20 mg total) by mouth daily. 30 tablet 2  . ELIQUIS 5 MG TABS tablet TAKE 1 TABLET (5 MG TOTAL) BY MOUTH 2 (TWO) TIMES DAILY. 60 tablet 2  . gabapentin (NEURONTIN) 300 MG capsule TAKE 1 CAPSULE BY MOUTH EVERYDAY AT BEDTIME 30 capsule 1  . metoprolol succinate (TOPROL-XL) 100 MG 24 hr tablet Take 2 tablets (200 mg total) by mouth daily. Take with or immediately following a meal. 180 tablet 3   No current facility-administered medications for this visit.    Allergies:   Patient has no known allergies.   Social History:  The patient  reports that he quit smoking about 10 months ago. He has never used smokeless tobacco. He reports previous alcohol use. He reports that he does not use drugs.   Family History:  The patient's  family history includes Diabetes in his mother and sister; Mental illness in his brother and sister.    ROS:  Please see the history of present illness.   All other systems are personally reviewed and negative.  PHYSICAL EXAM: VS:  BP (!) 160/86   Pulse (!) 58   Ht 5' 7.5" (1.715 m)   Wt 157 lb (71.2 kg)   SpO2 96%   BMI 24.23 kg/m  , BMI Body mass index is 24.23 kg/m. GEN: Well nourished, well developed, in no acute distress HEENT: normal Neck: no JVD  Cardiac: RRR  Respiratory:    normal work of breathing GI: soft  MS: no deformity or atrophy Skin: warm and dry  Neuro:  Strength and sensation are intact Psych: euthymic mood, full affect  EKG:  EKG is ordered today. The ekg  ordered today is personally reviewed and shows sinus rhythm 58 bpm, PR 170 msec, QRS 100 msec, Qtc 457 msec   Recent Labs: 05/07/2019: B Natriuretic Peptide 733.8 05/09/2019: Magnesium 1.8 09/20/2019: ALT 33; TSH 1.496 09/26/2019: BUN 13; Creatinine, Seth Snyder 1.31; Hemoglobin 13.5; Platelets 212; Potassium 4.9; Sodium 137  personally reviewed   Lipid Panel     Component Value Date/Time   CHOL 94 (L) 08/15/2019 0925   TRIG 93 08/15/2019 0925   HDL 41 08/15/2019 0925   CHOLHDL 2.3 08/15/2019 0925   CHOLHDL 2.8 05/07/2019 0823   VLDL 17 05/07/2019 0823   LDLCALC 35 08/15/2019 0925   personally reviewed   Wt Readings from Last 3 Encounters:  11/16/19 157 lb (71.2 kg)  10/22/19 154 lb 9.6 oz (70.1 kg)  10/09/19 157 lb (71.2 kg)     Other studies personally reviewed: Additional studies/ records that were reviewed today include: prior echo, AF clinic notes  Review of the above records today demonstrates: as above   ASSESSMENT AND PLAN:  1.  Persistent afib/ atrial flutter The patient has symptomatic, recurrent persistent atrial fibrillation.  He also carries a diagnosis of atrial flutter. He is currently doing well with amiodarone Chads2vasc score is 2.  he is anticoagulated with eliquis . Therapeutic strategies for afib including medicine and ablation were discussed in detail with the patient today. We may consider ablation in the future.  Right now, he is very symptomatic with radicular symptoms and should proceed first with surgical evaluation for this.  2. HTN Stable No change required today  3. Chronic systolic and diastolic dysfunction Likely worsened by AF Now in sinus and doing much better Repeat echo after 3 months of sinus  4. preop Ok to proceed with cervical surgery if indicated without further CV testing Hold eliquis 72 hours prior to the procedure and resume post operatively  Risks, benefits and potential toxicities for medications prescribed and/or refilled  reviewed with patient today.    Follow-up:  3 months with me  Current medicines are reviewed at length with the patient today.   The patient does not have concerns regarding his medicines.  The following changes were made today:  none  Labs/ tests ordered today include:  No orders of the defined types were placed in this encounter.    Randolm Idol, MD  11/16/2019 11:44 AM     [facility] [address] [place] [address] [phone] (office) [phone] (fax)

## 2019-11-16 NOTE — Patient Instructions (Addendum)
Medication Instructions:  Your physician recommends that you continue on your current medications as directed. Please refer to the Current Medication list given to you today.  *If you need a refill on your cardiac medications before your next appointment, please call your pharmacy*  Lab Work: None ordered.  If you have labs (blood work) drawn today and your tests are completely normal, you will receive your results only by: Marland Kitchen MyChart Message (if you have MyChart) OR . A paper copy in the mail If you have any lab test that is abnormal or we need to change your treatment, we will call you to review the results.  Testing/Procedures: None ordered.  Follow-Up: At [facility], you and your health needs are our priority.  As part of our continuing mission to provide you with exceptional heart care, we have created designated Provider Care Teams.  These Care Teams include your primary Cardiologist (physician) and Advanced Practice Providers (APPs -  Physician Assistants and Nurse Practitioners) who all work together to provide you with the care you need, when you need it.  We recommend signing up for the patient portal called "MyChart".  Sign up information is provided on this After Visit Summary.  MyChart is used to connect with patients for Virtual Visits (Telemedicine).  Patients are able to view lab/test results, encounter notes, upcoming appointments, etc.  Non-urgent messages can be sent to your provider as well.   To learn more about what you can do with MyChart, go to [email or web address].    Your next appointment:   Your physician wants you to follow-up in: 02/11/20 10:15 am at the [address] office.    Other Instructions:

## 2019-11-20 ENCOUNTER — Encounter: Payer: Self-pay | Admitting: Orthopaedic Surgery

## 2019-11-20 ENCOUNTER — Ambulatory Visit (INDEPENDENT_AMBULATORY_CARE_PROVIDER_SITE_OTHER): Payer: Medicaid Other | Admitting: Orthopaedic Surgery

## 2019-11-20 VITALS — BP 161/94 | HR 57 | Ht 67.5 in | Wt 157.0 lb

## 2019-11-20 DIAGNOSIS — M4802 Spinal stenosis, cervical region: Secondary | ICD-10-CM | POA: Diagnosis not present

## 2019-11-20 DIAGNOSIS — M5020 Other cervical disc displacement, unspecified cervical region: Secondary | ICD-10-CM

## 2019-11-20 MED ORDER — PREDNISONE 5 MG (21) PO TBPK
ORAL_TABLET | ORAL | 0 refills | Status: DC
Start: 2019-11-20 — End: 2020-05-08

## 2019-11-20 NOTE — Progress Notes (Signed)
Office Visit Note   Patient: Seth Snyder           Date of Birth: [birth year]           MRN: [id number] Visit Date: 11/20/2019              Requested by: Barbette Merino, NP [address] [place],  [place] [address] PCP: Barbette Merino, NP   Assessment & Plan: Visit Diagnoses:  1. Protrusion of cervical intervertebral disc   2. Spinal stenosis of cervical region     Plan: Prednisone Dosepak 5 mg sent in.  I will recheck him in a month.  We discussed limitation on elective surgery currently due to Covid in the community.  We will check him back again in a month.  We can discuss elective surgery at that time.  Today we reviewed the MRI scan cervical spine again today and discussed surgical options.  We can discuss this further on his return in 1 month.  Hopefully get some relief with prednisone Dosepak.  Follow-Up Instructions: Return in about 1 month (around 12/20/2019).   Orders:  No orders of the defined types were placed in this encounter.  Meds ordered this encounter  Medications  . predniSONE (STERAPRED UNI-PAK 21 TAB) 5 MG (21) TBPK tablet    Sig: Take 6 tablets first day , then one less tablet daily, 5,4,3,2,1. Take with food    Dispense:  21 tablet    Refill:  0      Procedures: No procedures performed   Clinical Data: No additional findings.   Subjective: Chief Complaint  Patient presents with  . Neck - Follow-up  . Lower Back - Follow-up    HPI patient returns his cardiologist states patient's Eliquis needs to be stopped 30 to 72 hours before his procedure and then restarted after.  Patient continues to have ongoing problems with neck pain more right shoulder pain than left and has paracentral disc protrusion deflecting the cord on the right at C6-7 and also mild to moderate spondylosis at C5-6.  He also has some lumbar back discomfort more on the right than left where he has significant lateral spurring at L2-3.  He has not had lumbar imaging.   Patient still ambulatory with a cane. Patient is here with an interpreter. Review of Systems Of note positive atrial flutter atrial flutter fib.  Currently he is in normal sinus rhythm. Objective: Vital Signs: BP (!) 161/94   Pulse (!) 57   Ht 5' 7.5" (1.715 m)   Wt 157 lb (71.2 kg)   BMI 24.23 kg/m   Physical Exam Constitutional:      Appearance: He is well-developed.  HENT:     Head: Normocephalic and atraumatic.  Eyes:     Pupils: Pupils are equal, round, and reactive to light.  Neck:     Thyroid: No thyromegaly.     Trachea: No tracheal deviation.  Cardiovascular:     Rate and Rhythm: Normal rate.  Pulmonary:     Effort: Pulmonary effort is normal.     Breath sounds: No wheezing.  Abdominal:     General: Bowel sounds are normal.     Palpations: Abdomen is soft.  Skin:    General: Skin is warm and dry.     Capillary Refill: Capillary refill takes less than 2 seconds.  Neurological:     Mental Status: He is alert and oriented to person, place, and time.  Psychiatric:  Behavior: Behavior normal.        Thought Content: Thought content normal.        Judgment: Judgment normal.     Ortho Exam positive Spurling on the right upper extremity reflexes are 2+.  He has triceps weakness on the right normal on the left no wrist flexion or finger extension weakness.  Good biceps and deltoid bilaterally. Specialty Comments:  No specialty comments available.  Imaging: No results found.   PMFS History: Patient Active Problem List   Diagnosis Date Noted  . Persistent atrial fibrillation (HCC) 09/20/2019  . Secondary hypercoagulable state (HCC) 09/20/2019  . Abnormal nuclear stress test   . Spinal stenosis of cervical region 08/13/2019  . Protrusion of cervical intervertebral disc 08/13/2019  . Shoulder pain   . Chronic combined systolic and diastolic heart failure (HCC)   . Atrial fibrillation with RVR (HCC) 05/07/2019  . Hypertension 05/07/2019  . Hypothyroidism  05/07/2019  . Hypomagnesemia 05/07/2019  . Elevated troponin 05/07/2019  . Acute pulmonary edema (HCC)    Past Medical History:  Diagnosis Date  . Anemia   . Atrial fibrillation (HCC)   . Hypertension   . Low back pain     Family History  Problem Relation Age of Onset  . Diabetes Mother   . Diabetes Sister   . Mental illness Sister   . Mental illness Brother     Past Surgical History:  Procedure Laterality Date  . CARDIOVERSION N/A 05/10/2019   Procedure: CARDIOVERSION;  Surgeon: Lewayne Bunting, MD;  Location: [facility] ENDOSCOPY;  Service: Cardiovascular;  Laterality: N/A;  . COLONOSCOPY  07/12/2013  . LEFT HEART CATH AND CORONARY ANGIOGRAPHY N/A 08/30/2019   Procedure: LEFT HEART CATH AND CORONARY ANGIOGRAPHY;  Surgeon: Corky Crafts, MD;  Location: [facility] INVASIVE CV LAB;  Service: Cardiovascular;  Laterality: N/A;  . TEE WITHOUT CARDIOVERSION N/A 05/10/2019   Procedure: TRANSESOPHAGEAL ECHOCARDIOGRAM (TEE);  Surgeon: Lewayne Bunting, MD;  Location: [facility] ENDOSCOPY;  Service: Cardiovascular;  Laterality: N/A;   Social History   Occupational History  . Not on file  Tobacco Use  . Smoking status: Former Smoker    Quit date: 12/21/2018    Years since quitting: 0.9  . Smokeless tobacco: Never Used  Vaping Use  . Vaping Use: Never used  Substance and Sexual Activity  . Alcohol use: Not Currently  . Drug use: Never  . Sexual activity: Not on file

## 2019-12-07 ENCOUNTER — Other Ambulatory Visit: Payer: Self-pay

## 2019-12-07 MED ORDER — GABAPENTIN 300 MG PO CAPS: ORAL_CAPSULE | ORAL | 2 refills | Status: DC

## 2019-12-10 ENCOUNTER — Encounter: Payer: Self-pay | Admitting: Cardiovascular Disease

## 2019-12-10 ENCOUNTER — Ambulatory Visit (INDEPENDENT_AMBULATORY_CARE_PROVIDER_SITE_OTHER): Payer: Medicaid Other | Admitting: Cardiovascular Disease

## 2019-12-10 ENCOUNTER — Other Ambulatory Visit: Payer: Self-pay

## 2019-12-10 ENCOUNTER — Telehealth: Payer: Self-pay

## 2019-12-10 VITALS — BP 150/92 | HR 57 | Ht 66.0 in | Wt 155.8 lb

## 2019-12-10 DIAGNOSIS — I5022 Chronic systolic (congestive) heart failure: Secondary | ICD-10-CM | POA: Diagnosis not present

## 2019-12-10 DIAGNOSIS — I10 Essential (primary) hypertension: Secondary | ICD-10-CM | POA: Diagnosis not present

## 2019-12-10 DIAGNOSIS — I5042 Chronic combined systolic (congestive) and diastolic (congestive) heart failure: Secondary | ICD-10-CM

## 2019-12-10 DIAGNOSIS — F32A Depression, unspecified: Secondary | ICD-10-CM

## 2019-12-10 DIAGNOSIS — Z5181 Encounter for therapeutic drug level monitoring: Secondary | ICD-10-CM

## 2019-12-10 DIAGNOSIS — F329 Major depressive disorder, single episode, unspecified: Secondary | ICD-10-CM

## 2019-12-10 DIAGNOSIS — M4802 Spinal stenosis, cervical region: Secondary | ICD-10-CM

## 2019-12-10 DIAGNOSIS — I4891 Unspecified atrial fibrillation: Secondary | ICD-10-CM

## 2019-12-10 MED ORDER — METOPROLOL SUCCINATE ER 100 MG PO TB24: 200.0000 mg | ORAL_TABLET | Freq: Every day | ORAL | 3 refills | Status: DC

## 2019-12-10 MED ORDER — AMIODARONE HCL 200 MG PO TABS: 200.0000 mg | ORAL_TABLET | Freq: Every day | ORAL | 5 refills | Status: DC

## 2019-12-10 MED ORDER — CITALOPRAM HYDROBROMIDE 20 MG PO TABS: 20.0000 mg | ORAL_TABLET | Freq: Every day | ORAL | 5 refills | Status: DC

## 2019-12-10 MED ORDER — SPIRONOLACTONE 25 MG PO TABS: 25.0000 mg | ORAL_TABLET | Freq: Every day | ORAL | 3 refills | Status: DC

## 2019-12-10 MED ORDER — ENTRESTO 24-26 MG PO TABS: 1.0000 | ORAL_TABLET | Freq: Two times a day (BID) | ORAL | 5 refills | Status: DC

## 2019-12-10 NOTE — Telephone Encounter (Signed)
**Note De-Identified Seth Snyder Obfuscation** I have started a Entresto PA through [employer]: Key: [id number]

## 2019-12-10 NOTE — Progress Notes (Signed)
Cardiology Office Note   Date:  12/10/2019   ID:  Seth Snyder, [place] 09-17-1960, MRN [id number]  PCP:  Seth Merino, NP  Cardiologist:   Seth Si, MD   No chief complaint on file.    History of Present Illness: Seth Snyder is a 59 y.o. male with atrial fibrillation, hypertension, and chronic systolic diastolic heart failure who presents for follow-up.  He was initially diagnosed with atrial fibrillation 01/2020 [facility].  He presented with hypertension and palpitations.  He had an echo 05/2019 that revealed LVEF 45 to 50% with global hypokinesis.  Right atrial pressure was 8 mmHg.  He underwent stress test 05/2019 which was suggestive of apical ischemia.  He subsequently had a left heart cath 06/2019 that revealed normal coronaries.  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  He underwent TEE/cardioversion on 04/2019 but went back into atrial fibrillation.  He was started on amiodarone.  He is been seen by Dr. Johney Frame, most recently 10/2019.  They discussed ablation in the future.  However this was deferred as he may need surgical intervention on his cervical radiculopathy.  He was in sinus rhythm at that appointment.  Today Seth Snyder is complaining of pain in the R chest and shoulder.  He has cervical spinal stenosis.  He is working with Dr. Ophelia Charter and received prednisone but didn't get relief.  They will discuss possible surgery at follow up next month.  Today he complains of shortness of breath both at rest and when lying down.  He denies PND.  He has not noted any lower extremity edema.  He does not weigh himself regularly at home nor does he check his blood pressure.  He denies any palpitations and thinks that his heart has remained in rhythm.   Past Medical History:  Diagnosis Date  . Anemia   . Atrial fibrillation (HCC)   . Hypertension   . Low back pain     Past Surgical History:  Procedure Laterality Date  . CARDIOVERSION N/A 05/10/2019   Procedure:  CARDIOVERSION;  Surgeon: Lewayne Bunting, MD;  Location: [facility] ENDOSCOPY;  Service: Cardiovascular;  Laterality: N/A;  . COLONOSCOPY  07/12/2013  . LEFT HEART CATH AND CORONARY ANGIOGRAPHY N/A 08/30/2019   Procedure: LEFT HEART CATH AND CORONARY ANGIOGRAPHY;  Surgeon: Corky Crafts, MD;  Location: [facility] INVASIVE CV LAB;  Service: Cardiovascular;  Laterality: N/A;  . TEE WITHOUT CARDIOVERSION N/A 05/10/2019   Procedure: TRANSESOPHAGEAL ECHOCARDIOGRAM (TEE);  Surgeon: Lewayne Bunting, MD;  Location: [facility] ENDOSCOPY;  Service: Cardiovascular;  Laterality: N/A;     Current Outpatient Medications  Medication Sig Dispense Refill  . acetaminophen (TYLENOL) 500 MG tablet Take 500 mg by mouth daily as needed for mild pain.    Marland Kitchen amiodarone (PACERONE) 200 MG tablet Take 1 tablet (200 mg total) by mouth daily. 30 tablet 5  . atorvastatin (LIPITOR) 40 MG tablet Take 1 tablet (40 mg total) by mouth daily at 6 PM. 90 tablet 3  . citalopram (CELEXA) 20 MG tablet Take 1 tablet (20 mg total) by mouth daily. 30 tablet 5  . ELIQUIS 5 MG TABS tablet TAKE 1 TABLET (5 MG TOTAL) BY MOUTH 2 (TWO) TIMES DAILY. 60 tablet 2  . gabapentin (NEURONTIN) 300 MG capsule TAKE 1 CAPSULE BY MOUTH EVERYDAY AT BEDTIME 30 capsule 2  . metoprolol succinate (TOPROL-XL) 100 MG 24 hr tablet Take 2 tablets (200 mg total) by mouth daily. Take with or immediately following a meal.  180 tablet 3  . predniSONE (STERAPRED UNI-PAK 21 TAB) 5 MG (21) TBPK tablet Take 6 tablets first day , then one less tablet daily, 5,4,3,2,1. Take with food 21 tablet 0  . sacubitril-valsartan (ENTRESTO) 24-26 MG Take 1 tablet by mouth 2 (two) times daily. 60 tablet 5  . spironolactone (ALDACTONE) 25 MG tablet Take 1 tablet (25 mg total) by mouth daily. 90 tablet 3   No current facility-administered medications for this visit.    Allergies:   Patient has no known allergies.    Social History:  The patient  reports that he quit smoking about a year ago. He  has never used smokeless tobacco. He reports previous alcohol use. He reports that he does not use drugs.   Family History:  The patient's family history includes Diabetes in his mother and sister; Mental illness in his brother and sister.    ROS:  Please see the history of present illness.   Otherwise, review of systems are positive for none.   All other systems are reviewed and negative.    PHYSICAL EXAM: VS:  BP (!) 150/92   Pulse (!) 57   Ht 5\' 6"  (1.676 m)   Wt 155 lb 12.8 oz (70.7 kg)   SpO2 98%   BMI 25.15 kg/m  , BMI Body mass index is 25.15 kg/m. GENERAL:  Clearly in pain HEENT:  Pupils equal round and reactive, fundi not visualized, oral mucosa unremarkable NECK:  No jugular venous distention, waveform within normal limits, carotid upstroke brisk and symmetric, no bruits LUNGS:  Clear to auscultation bilaterally HEART:  RRR.  PMI not displaced or sustained,S1 and S2 within normal limits, no S3, no S4, no clicks, no rubs, no murmurs ABD:  Flat, positive bowel sounds normal in frequency in pitch, no bruits, no rebound, no guarding, no midline pulsatile mass, no hepatomegaly, no splenomegaly EXT:  2 plus pulses throughout, no edema, no cyanosis no clubbing SKIN:  No rashes no nodules NEURO:  Cranial nerves II through XII grossly intact, motor grossly intact throughout PSYCH:  Cognitively intact, oriented to person place and time  EKG:  EKG is not ordered today. The ekg ordered today demonstrates   LHC 08/2019:  There is severe left ventricular systolic dysfunction.  The left ventricular ejection fraction is less than 25% by visual estimate.  LV end diastolic pressure is normal. LVEDP 12 mm Hg.  There is no aortic valve stenosis.  No significant CAD.  Severe vasospasm of right radial artery, even with 4 Fr catheter. Would not use the right radial artery if cath was needed in the future.   Medical therapy for nonischemic cardiomyopathy.   Resume Eliquis tomorrow  morning.   Lexiscan Myoview 05/2019:  There was no ST segment deviation noted during stress.  Study was not gated due to atrial fibrillation  Defect 1: There is a small fixed defect of mild severity present in the apex location.  Defect 2: There is a small reversible defect of mild severity present in the basal inferior, mid inferior and apical inferior location.  Findings consistent with ischemia and prior myocardial infarction.  This is a low risk study given small area of ischemia.   Fixed defect at apex, suggests infarct.  Reversible inferior defect, suggests ischemia.  Echo 04/2019: 1. Left ventricular ejection fraction, by estimation, is 45 to 50%. The  left ventricle has mildly decreased function. The left ventricle  demonstrates global hypokinesis. Left ventricular diastolic function could  not be evaluated.  2. Right ventricular systolic function is low normal. The right  ventricular size is normal. There is mildly elevated pulmonary artery  systolic pressure.  3. The mitral valve is grossly normal. Mild mitral valve regurgitation.  4. Tricuspid valve regurgitation is mild to moderate.  5. The aortic valve is tricuspid. Aortic valve regurgitation is mild. No  aortic stenosis is present.  6. There is mild dilatation of the ascending aorta measuring 39 mm.  7. The inferior vena cava is dilated in size with >50% respiratory  variability, suggesting right atrial pressure of 8 mmHg.   Recent Labs: 05/07/2019: B Natriuretic Peptide 733.8 05/09/2019: Magnesium 1.8 09/20/2019: ALT 33; TSH 1.496 09/26/2019: BUN 13; Creatinine, Eliot 1.31; Hemoglobin 13.5; Platelets 212; Potassium 4.9; Sodium 137    Lipid Panel    Component Value Date/Time   CHOL 94 (L) 08/15/2019 0925   TRIG 93 08/15/2019 0925   HDL 41 08/15/2019 0925   CHOLHDL 2.3 08/15/2019 0925   CHOLHDL 2.8 05/07/2019 0823   VLDL 17 05/07/2019 0823   LDLCALC 35 08/15/2019 0925      Wt Readings from Last 3  Encounters:  12/10/19 155 lb 12.8 oz (70.7 kg)  11/20/19 157 lb (71.2 kg)  11/16/19 157 lb (71.2 kg)      ASSESSMENT AND PLAN:  # Chronic systolic and diastolic heart failure: Mr. Hartog has NYHA class IV symptoms.  Currently he is only on metoprolol succinate.  We will continue this but add Entresto 24/26 mg twice daily.  We will also add spironolactone 25 mg daily he will need a basic metabolic panel in a week.  LVEF was less than 25% on his cath.  Hopefully with rhythm control his LVEF will improve.  He remains in sinus rhythm today.  We will repeat an echo in 3 months.  # Essential hypertension:  Blood pressure is elevated today.  I suspect some of this is due to the severe pain he is having from his cervical radiculopathy.  We will continue metoprolol and add Entresto as above.  Adding spironolactone as well.  # Persistent atrial fibrillation: He remains in sinus rhythm on amiodarone.  Thyroid function and liver function were normal 09/2019.  He follows with Dr. Johney Frame and may be considered for ablation in the future.  He failed cardioversion previously.  Continue Eliquis.  # Pre-surgical risk assessment:  Mr. Lysaght is euvolemic but does have heart failure symptoms.  We are starting Entresto as above.  Hopefully this will help.  If his shortness of breath resolves and blood pressure improves, he will be at acceptable risk for surgery.   Current medicines are reviewed at length with the patient today.  The patient does not have concerns regarding medicines.  The following changes have been made: Start Entresto and spironolactone  Labs/ tests ordered today include:   Orders Placed This Encounter  Procedures  . Basic metabolic panel  . AMB Referral to [facility] Pharm-D  . ECHOCARDIOGRAM COMPLETE     Disposition:   FU with Katiana Ruland C. Duke Salvia, MD, [facility] in 3 months.  PharmD for BP in 1 month.     Signed, Kristyana Notte C. Duke Salvia, MD, [facility]  12/10/2019 12:05 PM    [facility]

## 2019-12-10 NOTE — Patient Instructions (Signed)
Medication Instructions:  START ENTRESTO 24-26 TWICE A DAY  START SPIRONOLACTONE 25 MG DAILY   *If you need a refill on your cardiac medications before your next appointment, please call your pharmacy*   Lab Work: BMET IN 1 WEEK  If you have labs (blood work) drawn today and your tests are completely normal, you will receive your results only by: Marland Kitchen MyChart Message (if you have MyChart) OR . A paper copy in the mail If you have any lab test that is abnormal or we need to change your treatment, we will call you to review the results.   Testing/Procedures: Your physician has requested that you have an echocardiogram. Echocardiography is a painless test that uses sound waves to create images of your heart. It provides your doctor with information about the size and shape of your heart and how well your heart's chambers and valves are working. This procedure takes approximately one hour. There are no restrictions for this procedure. [facility] AT [address] TO BE DONE IN 3 MONTHS  Follow-Up: At [facility], you and your health needs are our priority.  As part of our continuing mission to provide you with exceptional heart care, we have created designated Provider Care Teams.  These Care Teams include your primary Cardiologist (physician) and Advanced Practice Providers (APPs -  Physician Assistants and Nurse Practitioners) who all work together to provide you with the care you need, when you need it.  We recommend signing up for the patient portal called "MyChart".  Sign up information is provided on this After Visit Summary.  MyChart is used to connect with patients for Virtual Visits (Telemedicine).  Patients are able to view lab/test results, encounter notes, upcoming appointments, etc.  Non-urgent messages can be sent to your provider as well.   To learn more about what you can do with MyChart, go to [email or web address].    Your next appointment:   3 month(s)  AFTER ECHO  The format for your next appointment:   In Person  Provider:   You may see Chilton Si, MD or one of the following Advanced Practice Providers on your designated Care Team:    Corine Shelter, PA-C  [place], [place]  Edd Fabian, [place]  Your physician recommends that you schedule a follow-up appointment in: PHARM D IN 1 MONTH FOR BLOOD PRESSURE

## 2019-12-11 NOTE — Telephone Encounter (Addendum)
**Note De-Identified Hortensia Duffin Obfuscation** Following message received from [employer]:  Yoel Strassman Key: [id number] - PA Case ID: [id number] Outcome Approved on September 20  Approved quantity: 60 tablets per 30 day(s).   Drug Entresto 24-26MG  tablets  Form [employer] Prior Authorization Request Form [phone] NCPDP)   I did call [facility] and made Vonna Kotyk aware that this Sherryll Burger PA has been approved. He thanked me for the update.

## 2019-12-12 ENCOUNTER — Other Ambulatory Visit: Payer: Self-pay

## 2019-12-12 ENCOUNTER — Encounter: Payer: Self-pay | Admitting: Nurse Practitioner

## 2019-12-12 ENCOUNTER — Ambulatory Visit (INDEPENDENT_AMBULATORY_CARE_PROVIDER_SITE_OTHER): Payer: Medicaid Other | Admitting: Nurse Practitioner

## 2019-12-12 VITALS — BP 123/76 | HR 50 | Temp 97.7°F | Ht 66.0 in | Wt 157.0 lb

## 2019-12-12 DIAGNOSIS — I10 Essential (primary) hypertension: Secondary | ICD-10-CM

## 2019-12-12 DIAGNOSIS — H7292 Unspecified perforation of tympanic membrane, left ear: Secondary | ICD-10-CM | POA: Diagnosis not present

## 2019-12-12 DIAGNOSIS — F329 Major depressive disorder, single episode, unspecified: Secondary | ICD-10-CM

## 2019-12-12 DIAGNOSIS — F32A Depression, unspecified: Secondary | ICD-10-CM

## 2019-12-12 DIAGNOSIS — R200 Anesthesia of skin: Secondary | ICD-10-CM | POA: Diagnosis not present

## 2019-12-12 DIAGNOSIS — R202 Paresthesia of skin: Secondary | ICD-10-CM

## 2019-12-12 DIAGNOSIS — G4701 Insomnia due to medical condition: Secondary | ICD-10-CM | POA: Diagnosis not present

## 2019-12-12 DIAGNOSIS — E038 Other specified hypothyroidism: Secondary | ICD-10-CM

## 2019-12-12 DIAGNOSIS — M4802 Spinal stenosis, cervical region: Secondary | ICD-10-CM

## 2019-12-12 DIAGNOSIS — R42 Dizziness and giddiness: Secondary | ICD-10-CM

## 2019-12-12 MED ORDER — GABAPENTIN 300 MG PO CAPS: ORAL_CAPSULE | ORAL | 3 refills | Status: DC

## 2019-12-12 NOTE — Progress Notes (Signed)
Seth Snyder Patient Seth Snyder [address] [place], [place]  [address] Phone:  [phone]   Fax:  [phone]   Established Patient Office Visit  Subjective:  Patient ID: Seth Snyder, male    DOB: Jun 21, 1960  Age: 59 y.o. MRN: [id number]  CC:  Chief Complaint  Patient presents with  . Follow-up    back pain, right shoulder body pain,  have difficulty sleeping at night    HPI [facility] presents for follow up. He is scheduled to have.  has a past medical history of Anemia, Atrial fibrillation (HCC), Hypertension, and Low back pain.    Seth Snyder is a 59 y.o. male who complains of insomnia. Onset was several months ago. Patient describes symptoms as non-restful sleep. Patient has found minimal relief with going to sleep at the same time each night. Associated symptoms include: depression and pain.The pain is making it difficult for him to sleep. He admits that the tingling is getting worse. It goes into the fingers. He has burning pain. He is going to follow up with ortho.  Patient denies daytime somnolence, restless legs and snoring. Symptoms have gradually worsened.     Vertigo Patient presents for evaluation of dizziness. The symptoms started a week ago and have gradually worsened. The attacks occur daily and last a few minutes. Positions that worsen symptoms: any motion and standing up. Previous workup/treatments: blood work: and scans. Associated ear symptoms: questionable pus coming out of the ear. Associated CNS symptoms: dimming vision, headaches and nausea. Recent infections: none. Head trauma: denied. Drug ingestion: none Noise exposure: no occupational exposure. Family history: none. He has a hearing problem. He has problems  R worse than L    Past Medical History:  Diagnosis Date  . Anemia   . Atrial fibrillation (HCC)   . Hypertension   . Low back pain     Past Surgical History:  Procedure Laterality Date  . CARDIOVERSION N/A 05/10/2019    Procedure: CARDIOVERSION;  Surgeon: Lewayne Bunting, MD;  Location: [facility] END[facility]COPY;  Service: Cardiovascular;  Laterality: N/A;  . COLONOSCOPY  07/12/2013  . LEFT HEART CATH AND CORONARY ANGIOGRAPHY N/A 08/30/2019   Procedure: LEFT HEART CATH AND CORONARY ANGIOGRAPHY;  Surgeon: Corky Crafts, MD;  Location: [facility]VAS[facility] LAB;  Service: Cardiovascular;  Laterality: N/A;  . TEE WITHOUT CARDIOVERSION N/A 05/10/2019   Procedure: TRANSESOPHAGEAL ECHOCARDIOGRAM (TEE);  Surgeon: Lewayne Bunting, MD;  Location: [facility] ENDOSCOPY;  Service: Cardiovascular;  Laterality: N/A;    Family History  Problem Relation Age of Onset  . Diabetes Mother   . Diabetes Sister   . Mental illness Sister   . Mental illness Brother     Social History   Socioeconomic History  . Marital status: Single    Spouse name: Not on file  . Number of children: Not on file  . Years of education: Not on file  . Highest education level: Not on file  Occupational History  . Not on file  Tobacco Use  . Smoking status: Former Smoker    Quit date: 12/21/2018    Years since quitting: 0.9  . Smokeless tobacco: Never Used  Vaping Use  . Vaping Use: Never used  Substance and Sexual Activity  . Alcohol use: Not Currently  . Drug use: Never  . Sexual activity: Not on file  Other Topics Concern  . Not on file  Social History Narrative  . Not on file   Social Determinants  of Health   Financial Resource Strain:   . Difficulty of Paying Living Expenses: Not on file  Food Insecurity: No Food Insecurity  . Worried About Programme researcher, broadcasting/film/video in the Last Year: Never true  . Ran Out of Food in the Last Year: Never true  Transportation Needs:   . Lack of Transportation (Medical): Not on file  . Lack of Transportation (Non-Medical): Not on file  Physical Activity:   . Days of Exercise per Week: Not on file  . Minutes of Exercise per Session: Not on file  Stress:   . Feeling of Stress : Not on file  Social Connections:     . Frequency of Communication with Friends and Family: Not on file  . Frequency of Social Gatherings with Friends and Family: Not on file  . Attends Religious Services: Not on file  . Active Member of Clubs or Organizations: Not on file  . Attends Banker Meetings: Not on file  . Marital Status: Not on file  Intimate Partner Violence:   . Fear of Current or Ex-Partner: Not on file  . Emotionally Abused: Not on file  . Physically Abused: Not on file  . Sexually Abused: Not on file    Outpatient Medications Prior to Visit  Medication Sig Dispense Refill  . acetaminophen (TYLENOL) 500 MG tablet Take 500 mg by mouth daily as needed for mild pain.    Marland Kitchen amiodarone (PACERONE) 200 MG tablet Take 1 tablet (200 mg total) by mouth daily. 30 tablet 5  . atorvastatin (LIPITOR) 40 MG tablet Take 1 tablet (40 mg total) by mouth daily at 6 PM. 90 tablet 3  . citalopram (CELEXA) 20 MG tablet Take 1 tablet (20 mg total) by mouth daily. 30 tablet 5  . ELIQUIS 5 MG TABS tablet TAKE 1 TABLET (5 MG TOTAL) BY MOUTH 2 (TWO) TIMES DAILY. 60 tablet 2  . metoprolol succinate (TOPROL-XL) 100 MG 24 hr tablet Take 2 tablets (200 mg total) by mouth daily. Take with or immediately following a meal. 180 tablet 3  . predniSONE (STERAPRED UNI-PAK 21 TAB) 5 MG (21) TBPK tablet Take 6 tablets first day , then one less tablet daily, 5,4,3,2,1. Take with food 21 tablet 0  . sacubitril-valsartan (ENTRESTO) 24-26 MG Take 1 tablet by mouth 2 (two) times daily. 60 tablet 5  . spironolactone (ALDACTONE) 25 MG tablet Take 1 tablet (25 mg total) by mouth daily. 90 tablet 3  . gabapentin (NEURONTIN) 300 MG capsule TAKE 1 CAPSULE BY MOUTH EVERYDAY AT BEDTIME 30 capsule 2   No facility-administered medications prior to visit.    No Known Allergies  ROS Review of Systems  Gastrointestinal: Positive for nausea.  Musculoskeletal: Positive for arthralgias, back pain, joint swelling and neck pain.       Gout pain   Generalized pain in chest  He has shoulder and upper back pain that radiates goes into the fingers;  tingling pain   Neurological: Positive for dizziness.  Psychiatric/Behavioral:       He continues to complain of the pain and desires to cut off his shoulder but he       Objective:    Physical Exam Constitutional:      Appearance: He is normal weight. He is not diaphoretic.  HENT:     Head: Normocephalic and atraumatic.     Right Ear: Tympanic membrane is scarred.     Left Ear: Tympanic membrane is perforated.     Nose: Nose  normal.     Mouth/Throat:     Mouth: Mucous membranes are moist.  Eyes:     Comments: glasses  Neck:     Comments: guarded Cardiovascular:     Rate and Rhythm: Rhythm irregular.     Pulses: Normal pulses.  Pulmonary:     Effort: Pulmonary effort is normal.     Breath sounds: Normal breath sounds.  Abdominal:     Palpations: Abdomen is soft.  Musculoskeletal:        General: Normal range of motion.  Skin:    General: Skin is warm.  Neurological:     General: No focal deficit present.     Mental Status: He is alert.     BP 123/76   Pulse (!) 50   Temp 97.7 F (36.5 C) (Temporal)   Ht 5\' 6"  (1.676 m)   Wt 157 lb (71.2 kg)   SpO2 98%   BMI 25.34 kg/m  Wt Readings from Last 3 Encounters:  12/12/19 157 lb (71.2 kg)  12/10/19 155 lb 12.8 oz (70.7 kg)  11/20/19 157 lb (71.2 kg)     Health Maintenance Due  Topic Date Due  . Hepatitis C Screening  Never done  . COLONOSCOPY  Never done  . INFLUENZA VACCINE  10/21/2019    There are no preventive care reminders to display for this patient.  Lab Results  Component Value Date   TSH 1.496 09/20/2019   Lab Results  Component Value Date   WBC 7.9 09/26/2019   HGB 13.5 09/26/2019   HCT 43.0 09/26/2019   MCV 88.3 09/26/2019   PLT 212 09/26/2019   Lab Results  Component Value Date   NA 137 09/26/2019   K 4.9 09/26/2019   CO2 22 09/26/2019   GLUCOSE 102 (H) 09/26/2019   BUN 13  09/26/2019   CREATININE 1.31 (H) 09/26/2019   BILITOT 3.0 (H) 09/20/2019   ALKPHOS 100 09/20/2019   AST 41 09/20/2019   ALT 33 09/20/2019   PROT 7.9 09/20/2019   ALBUMIN 4.2 09/20/2019   CALCIUM 9.2 09/26/2019   ANIONGAP 10 09/26/2019   Lab Results  Component Value Date   CHOL 94 (L) 08/15/2019   Lab Results  Component Value Date   HDL 41 08/15/2019   Lab Results  Component Value Date   LDLCALC 35 08/15/2019   Lab Results  Component Value Date   TRIG 93 08/15/2019   Lab Results  Component Value Date   CHOLHDL 2.3 08/15/2019   No results found for: HGBA1C    Assessment & Plan:   Problem List Items Addressed This Visit      Cardiovascular and Mediastinum   Hypertension] Encouraged on going compliance with current medication regimen Encouraged home monitoring and recording BP <130/80 Eating a heart-healthy diet with less salt Encouraged regular physical activity       Endocrine   Hypothyroidism We will continue to monitor labs every 3 to 6 months     Other   Spinal stenosis of cervical region Continue to follow-up with surgeon Gabapentin adjusted to hopefully help with paresthesias    Other Visit Diagnoses    Dizziness    -  Primary   Eardrum rupture, left     Encouraged follow up with ENT   Numbness and tingling of right upper extremity       Relevant Medications   gabapentin (NEURONTIN) 300 MG capsule (Start on 12/19/2019)   Insomnia due to medical condition  Depression, unspecified depression type          Meds ordered this encounter  Medications  . gabapentin (NEURONTIN) 300 MG capsule    Sig: Take 2 capsules (600 mg total) by mouth at bedtime for 7 days, THEN 3 capsules (900 mg total) at bedtime.    Dispense:  90 capsule    Refill:  3    Order Specific Question:   Supervising Provider    Answer:   Quentin Angst [[id number]]    Follow-up: Return in about 3 months (around 03/12/2020).    Barbette Merino, NP

## 2019-12-17 LAB — BASIC METABOLIC PANEL
BUN/Creatinine Ratio: 12 (ref 9–20)
BUN: 18 mg/dL (ref 6–24)
CO2: 23 mmol/L (ref 20–29)
Calcium: 9.5 mg/dL (ref 8.7–10.2)
Chloride: 103 mmol/L (ref 96–106)
Creatinine, Ser: 1.45 mg/dL — ABNORMAL HIGH (ref 0.76–1.27)
GFR calc Af Amer: 61 mL/min/{1.73_m2} (ref >59)
GFR calc non Af Amer: 53 mL/min/{1.73_m2} — ABNORMAL LOW (ref >59)
Glucose: 84 mg/dL (ref 65–99)
Potassium: 4.7 mmol/L (ref 3.5–5.2)
Sodium: 139 mmol/L (ref 134–144)

## 2019-12-20 ENCOUNTER — Telehealth: Payer: Self-pay | Admitting: Cardiovascular Disease

## 2019-12-20 NOTE — Telephone Encounter (Signed)
*  STAT* If patient is at the pharmacy, call can be transferred to refill team.   1. Which medications need to be refilled? (please list name of each medication and dose if known)   ELIQUIS 5 MG TABS tablet    2. Which pharmacy/location (including street and city if local pharmacy) is medication to be sent to? [id number] - [place], [place] - [address]   3. Do they need a 30 day or 90 day supply?  90 day supply  

## 2019-12-21 MED ORDER — APIXABAN 5 MG PO TABS: ORAL_TABLET | ORAL | 5 refills | Status: DC

## 2019-12-21 NOTE — Telephone Encounter (Signed)
Prescription refill request for Eliquis received. Indication:  Atrial Fibrillation Last office visit: 11/2019 Duke Salvia Scr:  1.45 11/2019 Age: 59 Weight:  71.2 kg  Prescription refilled

## 2019-12-25 ENCOUNTER — Encounter: Payer: Self-pay | Admitting: Orthopaedic Surgery

## 2019-12-25 ENCOUNTER — Ambulatory Visit (INDEPENDENT_AMBULATORY_CARE_PROVIDER_SITE_OTHER): Payer: Medicaid Other | Admitting: Orthopaedic Surgery

## 2019-12-25 VITALS — BP 93/58 | HR 56

## 2019-12-25 DIAGNOSIS — M4802 Spinal stenosis, cervical region: Secondary | ICD-10-CM

## 2019-12-25 DIAGNOSIS — M5020 Other cervical disc displacement, unspecified cervical region: Secondary | ICD-10-CM

## 2019-12-25 NOTE — Progress Notes (Signed)
Office Visit Note   Patient: Seth Snyder           Date of Birth: 1961/01/29           MRN: [id number] Visit Date: 12/25/2019              Requested by: Barbette Merino, NP [address] [place],  [place] [address] PCP: Barbette Merino, NP   Assessment & Plan: Visit Diagnoses:  1. Protrusion of cervical intervertebral disc   2. Spinal stenosis of cervical region     Plan: Patient is spinal stenosis with compression of the right side with radiculopathy C7 nerve root weakness.  He has had persistent symptoms since May not improved with conservative treatment.  He would like to proceed with operative intervention which would be two-level cervical fusion C5-6, C6-7 anterior cervical discectomy and fusion with allograft and plate.  Overnight stay.  Soft collar x6weeks.  Procedure discussed risk surgery discussed through the interpreter questions were elicited and answered.  He would require preoperative clearance and stopping his Eliquis 72 hours before the procedure.  We could restart the Eliquis the day after surgery.  Follow-Up Instructions: No follow-ups on file.   Orders:  No orders of the defined types were placed in this encounter.  No orders of the defined types were placed in this encounter.     Procedures: No procedures performed   Clinical Data: No additional findings.   Subjective: Chief Complaint  Patient presents with   Neck - Pain    HPI 59 year old male from [place] here with interpreter continues to complain of neck pain pain that radiates in his left shoulder and down to the long finger of his right hand only.  He states the pain bothers him on a daily basis.  He has not worked since last year and worked at a packaging type job where a Therapist, music for several years.  Patient has atrial fibrillation and is on Eliquis chronically.  He states he also continued to have some right groin pain pain with rotation of his hip and mild right hip limp.   He has been treated for his neck with prednisone Dosepak therapy exercise program, topical creams, ice and heat.  Cervical MRI scan 07/20/2019 showed disc protrusion central and foraminal at C6-7 on the right with compression of the right ventrolateral thecal sac flattening the cord with mild central stenosis.  He had multifactorial moderate spinal stenosis at the C5-6 level with spurring and disc protrusion and bilateral neural foraminal narrowing moderate right, mild left.  Patient did not get relief with the prednisone Dosepak more than a few days then had recurrence of symptoms.  He has been treated with Neurontin without relief.  Patient states she is interested in proceeding with surgery.  Surgery had been delayed due to Covid restrictions at the hospital for elective surgery.  Review of Systems negative for chills or fever.  Positive for atrial fib history of rapid ventricular rate.  Cardioversion February 2021 Dr. Jens Som.  Chronic Eliquis.   Objective: Vital Signs: BP (!) 93/58    Pulse (!) 56   Physical Exam Constitutional:      Appearance: He is well-developed.  HENT:     Head: Normocephalic and atraumatic.  Eyes:     Pupils: Pupils are equal, round, and reactive to light.  Neck:     Thyroid: No thyromegaly.     Trachea: No tracheal deviation.  Cardiovascular:  Rate and Rhythm: Normal rate.  Pulmonary:     Effort: Pulmonary effort is normal.     Breath sounds: No wheezing.  Abdominal:     General: Bowel sounds are normal.     Palpations: Abdomen is soft.  Skin:    General: Skin is warm and dry.     Capillary Refill: Capillary refill takes less than 2 seconds.  Neurological:     Mental Status: He is alert and oriented to person, place, and time.  Psychiatric:        Behavior: Behavior normal.        Thought Content: Thought content normal.        Judgment: Judgment normal.     Ortho Exam patient has no lower extremity clonus.  He has some pain with internal  rotation right hip it 25 degrees internal rotation.  Mild discomfort external rotation 30 degrees.  Opposite left hip internal/external rotation no pain.  Negative straight leg raising right and left.  Mild paralumbar tenderness on the right side.  Patient is screened brachial plexus tenderness on the right positive Spurling on the right.  Triceps on the right is one half grade weak as well as wrist flexion.  No finger extension weakness.  Biceps and wrist extension is strong right and left and symmetrical.  Trace triceps reflex on the right 2+ on the left 2+ biceps and brachial radialis reflex right and left and symmetrical.  Increased pain with cervical compression minimal improvement with distraction.  Specialty Comments:  No specialty comments available.  Imaging: No results found.   PMFS History: Patient Active Problem List   Diagnosis Date Noted   Persistent atrial fibrillation (HCC) 09/20/2019   Secondary hypercoagulable state (HCC) 09/20/2019   Abnormal nuclear stress test    Spinal stenosis of cervical region 08/13/2019   Protrusion of cervical intervertebral disc 08/13/2019   Shoulder pain    Chronic combined systolic and diastolic heart failure (HCC)    Atrial fibrillation with RVR (HCC) 05/07/2019   Hypertension 05/07/2019   Hypothyroidism 05/07/2019   Hypomagnesemia 05/07/2019   Elevated troponin 05/07/2019   Past Medical History:  Diagnosis Date   Anemia    Atrial fibrillation (HCC)    Hypertension    Low back pain     Family History  Problem Relation Age of Onset   Diabetes Mother    Diabetes Sister    Mental illness Sister    Mental illness Brother     Past Surgical History:  Procedure Laterality Date   CARDIOVERSION N/A 05/10/2019   Procedure: CARDIOVERSION;  Surgeon: Lewayne Bunting, MD;  Location: [facility] ENDOSCOPY;  Service: Cardiovascular;  Laterality: N/A;   COLONOSCOPY  07/12/2013   LEFT HEART CATH AND CORONARY ANGIOGRAPHY N/A  08/30/2019   Procedure: LEFT HEART CATH AND CORONARY ANGIOGRAPHY;  Surgeon: Corky Crafts, MD;  Location: [facility] INVASIVE CV LAB;  Service: Cardiovascular;  Laterality: N/A;   TEE WITHOUT CARDIOVERSION N/A 05/10/2019   Procedure: TRANSESOPHAGEAL ECHOCARDIOGRAM (TEE);  Surgeon: Lewayne Bunting, MD;  Location: [facility] ENDOSCOPY;  Service: Cardiovascular;  Laterality: N/A;   Social History   Occupational History   Not on file  Tobacco Use   Smoking status: Former Smoker    Quit date: 12/21/2018    Years since quitting: 1.0   Smokeless tobacco: Never Used  Vaping Use   Vaping Use: Never used  Substance and Sexual Activity   Alcohol use: Not Currently   Drug use: Never   Sexual activity: Not  on file

## 2019-12-27 ENCOUNTER — Other Ambulatory Visit: Payer: Self-pay

## 2019-12-27 ENCOUNTER — Ambulatory Visit (INDEPENDENT_AMBULATORY_CARE_PROVIDER_SITE_OTHER): Payer: Medicaid Other | Admitting: Pharmacist

## 2019-12-27 VITALS — BP 108/80 | HR 57 | Ht 67.0 in | Wt 155.2 lb

## 2019-12-27 DIAGNOSIS — I5042 Chronic combined systolic (congestive) and diastolic (congestive) heart failure: Secondary | ICD-10-CM

## 2019-12-27 DIAGNOSIS — I10 Essential (primary) hypertension: Secondary | ICD-10-CM | POA: Diagnosis not present

## 2019-12-27 NOTE — Assessment & Plan Note (Signed)
Blood pressure is well controlled since initiating Entresto and Spironolactone, but patient is experiencing some dizziness. Also noted Scr increased from 1.31 to 1.45; therefore, will discontinue spironolactone per Dr Leonides Sake recommendation, and repeat BMET in 1 week. Will continue metoprolol succinate 100mg  daily and Entresto 24/26 twice daily and follow up at HTN clinic in 4 weeks

## 2019-12-27 NOTE — Assessment & Plan Note (Signed)
Patient able to tolerate metoprolol succinate and Entresto 24/26, but noted increased SCR and dizziness after adding spironolactone. Will HOLD spironolactone for now, repeat BMET in 1 week, and follow up in 4 weeks.

## 2019-12-27 NOTE — Patient Instructions (Addendum)
Return for a follow up appointment in 4 weeks   Go to the lab in 1 week  Check your blood pressure at home daily (if able) and keep record of the readings.  Take your BP meds as follows:  *STOP taking spironolactone* *Continue taking Entresto 24-26mg  twice daily *Contninue taking metoprolol succinate and all other prescribed medication*  Bring all of your meds, your BP cuff and your record of home blood pressures to your next appointment.  Exercise as you're able, try to walk approximately 30 minutes per day.  Keep salt intake to a minimum, especially watch canned and prepared boxed foods.  Eat more fresh fruits and vegetables and fewer canned items.  Avoid eating in fast food restaurants.    HOW TO TAKE YOUR BLOOD PRESSURE: . Rest 5 minutes before taking your blood pressure. .  Don't smoke or drink caffeinated beverages for at least 30 minutes before. . Take your blood pressure before (not after) you eat. . Sit comfortably with your back supported and both feet on the floor (don't cross your legs). . Elevate your arm to heart level on a table or a desk. . Use the proper sized cuff. It should fit smoothly and snugly around your bare upper arm. There should be enough room to slip a fingertip under the cuff. The bottom edge of the cuff should be 1 inch above the crease of the elbow. . Ideally, take 3 measurements at one sitting and record the average.

## 2019-12-27 NOTE — Progress Notes (Signed)
Patient ID: Seth Snyder                 DOB: 08-Sep-1960                      MRN: [id number]     HPI: Seth Snyder is a 59 y.o. male referred by Dr. Duke Snyder to HTN clinic. PMH includes atrial fibrillation, spinal stenosis, HF with EF 45-50%, hypertension, hypothyroidism, the right side of his chest hurts but seems to related to his cervical and spinal injury , denies shortness of breath or swelling.   Patient is from [place] and presets to clinic without an interpreter. We used the [employer] Device with interpreter ID [id number]. Patient seems to be slightly confused by his medication , but reports dizziness since taking Entresto and spironolactone. Noted his SCR increased from 1.31 to 1.45 as well, but potassium remains within normal limits.  Current HTN meds:  Entresto 24-26 twice daily - dizziness Spironolactone 25mg  daily - hold per Dr Metoprolol succinate 100mg  daily  Previously tried:   BP goal: <130/80  Family History: The patient's family history includes Diabetes in his mother and sister; Mental illness in his brother and sister.    Social History: The patient  reports that he quit smoking about a year ago. He has never used smokeless tobacco. He reports previous alcohol use. He reports that he does not use drugs.   Diet:   Exercise:   Home BP readings:   Wt Readings from Last 3 Encounters:  12/27/19 155 lb 3.2 oz (70.4 kg)  12/12/19 157 lb (71.2 kg)  12/10/19 155 lb 12.8 oz (70.7 kg)   BP Readings from Last 3 Encounters:  12/27/19 108/80  12/25/19 (!) 93/58  12/12/19 123/76   Pulse Readings from Last 3 Encounters:  12/27/19 (!) 57  12/25/19 (!) 56  12/12/19 (!) 50    Renal function: Estimated Creatinine Clearance: 52.6 mL/min (A) (by C-G formula based on SCr of 1.45 mg/dL (H)).  Past Medical History:  Diagnosis Date   Anemia    Atrial fibrillation (HCC)    Hypertension    Low back pain     Current Outpatient Medications on File Prior  to Visit  Medication Sig Dispense Refill   acetaminophen (TYLENOL) 500 MG tablet Take 500 mg by mouth daily as needed for mild pain.     amiodarone (PACERONE) 200 MG tablet Take 1 tablet (200 mg total) by mouth daily. 30 tablet 5   apixaban (ELIQUIS) 5 MG TABS tablet TAKE 1 TABLET (5 MG TOTAL) BY MOUTH 2 (TWO) TIMES DAILY. 60 tablet 5   atorvastatin (LIPITOR) 40 MG tablet Take 1 tablet (40 mg total) by mouth daily at 6 PM. 90 tablet 3   citalopram (CELEXA) 20 MG tablet Take 1 tablet (20 mg total) by mouth daily. 30 tablet 5   gabapentin (NEURONTIN) 300 MG capsule Take 2 capsules (600 mg total) by mouth at bedtime for 7 days, THEN 3 capsules (900 mg total) at bedtime. 90 capsule 3   metoprolol succinate (TOPROL-XL) 100 MG 24 hr tablet Take 2 tablets (200 mg total) by mouth daily. Take with or immediately following a meal. 180 tablet 3   sacubitril-valsartan (ENTRESTO) 24-26 MG Take 1 tablet by mouth 2 (two) times daily. 60 tablet 5   predniSONE (STERAPRED UNI-PAK 21 TAB) 5 MG (21) TBPK tablet Take 6 tablets first day , then one less tablet daily, 5,4,3,2,1. Take with food 21 tablet  0   No current facility-administered medications on file prior to visit.    No Known Allergies  Blood pressure 108/80, pulse (!) 57, height 5\' 7"  (1.702 m), weight 155 lb 3.2 oz (70.4 kg), SpO2 95 %.  Hypertension Blood pressure is well controlled since initiating Entresto and Spironolactone, but patient is experiencing some dizziness. Also noted Scr increased from 1.31 to 1.45; therefore, will discontinue spironolactone per Dr recommendation, and repeat BMET in 1 week. Will continue metoprolol succinate 100mg  daily and Entresto 24/26 twice daily and follow up at HTN clinic in 4 weeks  Chronic combined systolic and diastolic heart failure (HCC) Patient able to tolerate metoprolol succinate and Entresto 24/26, but noted increased SCR and dizziness after adding spironolactone. Will HOLD  spironolactone for now, repeat BMET in 1 week, and follow up in 4 weeks.  Sheronda Parran Rodriguez-Guzman PharmD, BCPS, CPP [facility] [address] [place] [address] 12/27/2019 4:52 PM

## 2020-01-03 LAB — BASIC METABOLIC PANEL
BUN/Creatinine Ratio: 18 (ref 9–20)
BUN: 27 mg/dL — ABNORMAL HIGH (ref 6–24)
CO2: 21 mmol/L (ref 20–29)
Calcium: 9.4 mg/dL (ref 8.7–10.2)
Chloride: 102 mmol/L (ref 96–106)
Creatinine, Ser: 1.54 mg/dL — ABNORMAL HIGH (ref 0.76–1.27)
GFR calc Af Amer: 57 mL/min/{1.73_m2} — ABNORMAL LOW (ref >59)
GFR calc non Af Amer: 49 mL/min/{1.73_m2} — ABNORMAL LOW (ref >59)
Glucose: 91 mg/dL (ref 65–99)
Potassium: 4.9 mmol/L (ref 3.5–5.2)
Sodium: 134 mmol/L (ref 134–144)

## 2020-01-04 ENCOUNTER — Telehealth: Payer: Self-pay | Admitting: *Deleted

## 2020-01-04 DIAGNOSIS — N289 Disorder of kidney and ureter, unspecified: Secondary | ICD-10-CM

## 2020-01-04 NOTE — Telephone Encounter (Signed)
-----   Message from Chilton Si, MD sent at 01/03/2020  4:50 PM EDT ----- Unfortunately his kidney function is still abnormal.  Recommend stopping Entresto.  Repeat BMP in a week.  If renal function normalizes, consider low dose losartan.

## 2020-01-04 NOTE — Telephone Encounter (Signed)
Patient c/o itching all over body fo the last several days Denies any rash, change in detergents/soaps/lotion, or new medications  Advised to see how it does after stopping Entresto and if no better contact PCP Called pharmacy and d/c [employer]

## 2020-01-04 NOTE — Telephone Encounter (Signed)
Translator [phone] advised patient He will come next week to office for recheck labs

## 2020-01-07 NOTE — Telephone Encounter (Signed)
Agree. Thanks

## 2020-01-11 LAB — BASIC METABOLIC PANEL
BUN/Creatinine Ratio: 16 (ref 9–20)
BUN: 25 mg/dL — ABNORMAL HIGH (ref 6–24)
CO2: 23 mmol/L (ref 20–29)
Calcium: 9.6 mg/dL (ref 8.7–10.2)
Chloride: 101 mmol/L (ref 96–106)
Creatinine, Ser: 1.59 mg/dL — ABNORMAL HIGH (ref 0.76–1.27)
GFR calc Af Amer: 55 mL/min/{1.73_m2} — ABNORMAL LOW (ref >59)
GFR calc non Af Amer: 47 mL/min/{1.73_m2} — ABNORMAL LOW (ref >59)
Glucose: 94 mg/dL (ref 65–99)
Potassium: 4.9 mmol/L (ref 3.5–5.2)
Sodium: 136 mmol/L (ref 134–144)

## 2020-01-14 ENCOUNTER — Other Ambulatory Visit: Payer: Self-pay | Admitting: Cardiovascular Disease

## 2020-01-17 ENCOUNTER — Encounter: Payer: Self-pay | Admitting: *Deleted

## 2020-01-17 DIAGNOSIS — F32A Depression, unspecified: Secondary | ICD-10-CM

## 2020-01-21 NOTE — Telephone Encounter (Signed)
This encounter was created in error - please disregard.

## 2020-01-25 ENCOUNTER — Ambulatory Visit: Payer: Medicaid Other

## 2020-01-25 ENCOUNTER — Telehealth: Payer: Self-pay | Admitting: Pharmacist

## 2020-01-25 NOTE — Progress Notes (Deleted)
Patient ID: Seth Snyder                 DOB: 12-Nov-1960                      MRN: [id number]     HPI: Seth Snyder is a 59 y.o. male referred by Dr. Duke Snyder to HTN clinic. PMH includes atrial fibrillation, spinal stenosis, HF with EF 45-50%, hypertension, hypothyroidism, the right side of his chest hurts but seems to related to his cervical and spinal injury , denies shortness of breath or swelling.   Patient is from [place] and presets to clinic without an interpreter. We used the [employer] Device with interpreter ID [phone]. Patient seems to be slightly confused by his medication , but reports dizziness since taking Entresto and spironolactone. Noted his SCR increased from 1.31 to 1.45 as well, but potassium remains within normal limits.  Current HTN meds:  Entresto 24-26 twice daily - dizziness Spironolactone 25mg  daily - hold per Dr Metoprolol succinate 100mg  daily  Previously tried:   BP goal: <130/80  Family History: The patient's family history includes Diabetes in his mother and sister; Mental illness in his brother and sister.    Social History: The patient  reports that he quit smoking about a year ago. He has never used smokeless tobacco. He reports previous alcohol use. He reports that he does not use drugs.   Diet:   Exercise:   Home BP readings:   Wt Readings from Last 3 Encounters:  12/27/19 155 lb 3.2 oz (70.4 kg)  12/12/19 157 lb (71.2 kg)  12/10/19 155 lb 12.8 oz (70.7 kg)   BP Readings from Last 3 Encounters:  12/27/19 108/80  12/25/19 (!) 93/58  12/12/19 123/76   Pulse Readings from Last 3 Encounters:  12/27/19 (!) 57  12/25/19 (!) 56  12/12/19 (!) 50    Renal function: CrCl cannot be calculated (Unknown ideal weight.).  Past Medical History:  Diagnosis Date  . Anemia   . Atrial fibrillation (HCC)   . Hypertension   . Low back pain     Current Outpatient Medications on File Prior to Visit  Medication Sig Dispense Refill  .  acetaminophen (TYLENOL) 500 MG tablet Take 500 mg by mouth daily as needed for mild pain.    02/24/20 amiodarone (PACERONE) 200 MG tablet Take 1 tablet (200 mg total) by mouth daily. 30 tablet 5  . apixaban (ELIQUIS) 5 MG TABS tablet TAKE 1 TABLET (5 MG TOTAL) BY MOUTH 2 (TWO) TIMES DAILY. 60 tablet 5  . atorvastatin (LIPITOR) 40 MG tablet Take 1 tablet (40 mg total) by mouth daily at 6 PM. 90 tablet 3  . citalopram (CELEXA) 20 MG tablet Take 1 tablet (20 mg total) by mouth daily. 30 tablet 5  . gabapentin (NEURONTIN) 300 MG capsule Take 2 capsules (600 mg total) by mouth at bedtime for 7 days, THEN 3 capsules (900 mg total) at bedtime. 90 capsule 3  . metoprolol succinate (TOPROL-XL) 100 MG 24 hr tablet Take 2 tablets (200 mg total) by mouth daily. Take with or immediately following a meal. 180 tablet 3  . predniSONE (STERAPRED UNI-PAK 21 TAB) 5 MG (21) TBPK tablet Take 6 tablets first day , then one less tablet daily, 5,4,3,2,1. Take with food 21 tablet 0   No current facility-administered medications on file prior to visit.    Allergies  Allergen Reactions  . Entresto [Sacubitril-Valsartan]  Abnormal kidney functions     There were no vitals taken for this visit.  No problem-specific Assessment & Plan notes found for this encounter.  Seth Snyder PharmD, BCPS, CPP [facility] [address] [place] [address] 01/25/2020 8:11 AM

## 2020-01-25 NOTE — Telephone Encounter (Signed)
Called placed with Translator line [phone]  Evlyn Clines (Translator [phone])   Appointment with PharmD for Entresto titration was cancelled. Patient stopped taking Entresto on 01/04/2020 per Dr Leonides Sake instruction. Follow up with Dr Johney Frame already scheduled for Nov/22.  Blood work completed as instrcuted. Dr Leonides Sake nurse to call patient as soon as Dr Duke Salvia has opportunity toi review results.

## 2020-02-11 ENCOUNTER — Ambulatory Visit: Payer: Medicaid Other | Admitting: Internal Medicine

## 2020-02-18 ENCOUNTER — Other Ambulatory Visit (INDEPENDENT_AMBULATORY_CARE_PROVIDER_SITE_OTHER): Payer: Medicaid Other | Admitting: Nurse Practitioner

## 2020-02-18 DIAGNOSIS — M4802 Spinal stenosis, cervical region: Secondary | ICD-10-CM

## 2020-02-18 DIAGNOSIS — Z9989 Dependence on other enabling machines and devices: Secondary | ICD-10-CM

## 2020-02-18 DIAGNOSIS — F332 Major depressive disorder, recurrent severe without psychotic features: Secondary | ICD-10-CM

## 2020-02-18 DIAGNOSIS — R262 Difficulty in walking, not elsewhere classified: Secondary | ICD-10-CM

## 2020-02-18 NOTE — Progress Notes (Signed)
   [facility] [address] [place], [place]  [address] Phone:  [phone]   Fax:  [phone]  Telephone Note  I connected with Elpidio Eric care team member [facility] related to Shawna Lowgap on 02/18/20   by telephone and verified that I am speaking with the correct person using two identifiers.    History of Present Illness: Mr. Kirksey  has a past medical history of Anemia, Atrial fibrillation (HCC), Hypertension, and Low back pain.  She admits that he is having problems with ADLs and currently has a home health aide in place.  He is having more difficulty walking with a cane and is requesting a walker. He is also having increased depression despite current use of citalopram 20 mg daily   Observations/Objective: No exam patient not involved in phone call  Assessment and Plan: Assessment  Primary Diagnosis & Pertinent Problem List: The primary encounter diagnosis was Severe episode of recurrent major depressive disorder, without psychotic features (HCC). Diagnoses of Spinal stenosis of cervical region, Impaired ambulation, and Walker as ambulation aid were also pertinent to this visit.  Visit Diagnosis: 1. Severe episode of recurrent major depressive disorder, without psychotic features [facility]) Referral to psychiatry for further evaluation  2. Spinal stenosis of cervical region   3. Impaired ambulation Order for walker to assist with ambulation  4. Walker as ambulation aid     Follow Up Instructions:    I discussed the assessment and treatment plan with the patient. The patient was provided an opportunity to ask questions and all were answered. The patient agreed with the plan and demonstrated an understanding of the instructions.   The patient was advised to call back or seek an in-person evaluation if the symptoms worsen or if the condition fails to improve as anticipated.  I provided 5 minutes of non-face-to-face time during this  encounter.   Barbette Merino, NP

## 2020-03-10 ENCOUNTER — Other Ambulatory Visit (HOSPITAL_COMMUNITY): Payer: Medicaid Other

## 2020-03-10 ENCOUNTER — Ambulatory Visit: Payer: Medicaid Other | Admitting: Internal Medicine

## 2020-03-11 ENCOUNTER — Encounter: Payer: Self-pay | Admitting: Cardiovascular Disease

## 2020-03-11 ENCOUNTER — Ambulatory Visit (INDEPENDENT_AMBULATORY_CARE_PROVIDER_SITE_OTHER): Payer: Medicaid Other | Admitting: Cardiovascular Disease

## 2020-03-11 ENCOUNTER — Other Ambulatory Visit: Payer: Self-pay

## 2020-03-11 ENCOUNTER — Telehealth: Payer: Self-pay | Admitting: Licensed Clinical Social Worker

## 2020-03-11 VITALS — BP 190/100 | HR 54 | Ht 67.0 in | Wt 156.0 lb

## 2020-03-11 DIAGNOSIS — M4802 Spinal stenosis, cervical region: Secondary | ICD-10-CM

## 2020-03-11 DIAGNOSIS — I4819 Other persistent atrial fibrillation: Secondary | ICD-10-CM | POA: Diagnosis not present

## 2020-03-11 DIAGNOSIS — I10 Essential (primary) hypertension: Secondary | ICD-10-CM | POA: Diagnosis not present

## 2020-03-11 DIAGNOSIS — I5042 Chronic combined systolic (congestive) and diastolic (congestive) heart failure: Secondary | ICD-10-CM

## 2020-03-11 MED ORDER — HYDRALAZINE HCL 50 MG PO TABS: 50.0000 mg | ORAL_TABLET | Freq: Two times a day (BID) | ORAL | 3 refills | Status: DC

## 2020-03-11 MED ORDER — CARVEDILOL 25 MG PO TABS: 25.0000 mg | ORAL_TABLET | Freq: Two times a day (BID) | ORAL | 3 refills | Status: DC

## 2020-03-11 NOTE — Patient Instructions (Signed)
Medication Instructions:  STOP METOPROLOL   START CARVEDILOL 25 MG TWICE A DAY  START HYDRALAZINE 50 MG TWICE A DAY   *If you need a refill on your cardiac medications before your next appointment, please call your pharmacy*  Lab Work: NONE   Testing/Procedures: GET YOUR ECHO AS SCHEDULED IN January    Follow-Up: At [facility], you and your health needs are our priority.  As part of our continuing mission to provide you with exceptional heart care, we have created designated Provider Care Teams.  These Care Teams include your primary Cardiologist (physician) and Advanced Practice Providers (APPs -  Physician Assistants and Nurse Practitioners) who all work together to provide you with the care you need, when you need it.  We recommend signing up for the patient portal called "MyChart".  Sign up information is provided on this After Visit Summary.  MyChart is used to connect with patients for Virtual Visits (Telemedicine).  Patients are able to view lab/test results, encounter notes, upcoming appointments, etc.  Non-urgent messages can be sent to your provider as well.   To learn more about what you can do with MyChart, go to [email or web address].    Your next appointment:   3 month(s)  The format for your next appointment:   In Person  Provider:   Chilton Si, MD  Your physician recommends that you schedule a follow-up appointment in: 1 MONTH WITH NP/PA   Other Instructions  MONITOR AND LOG YOUR BLOOD PRESSURE AT HOME. BRING YOUR READINGS AND MACHINE TO FOLLOW UP IN 1 MONTH

## 2020-03-11 NOTE — Progress Notes (Signed)
Cardiology Office Note   Date:  03/11/2020   ID:  Seth Snyder, Seth Snyder [birth year], MRN [id number]  PCP:  Barbette Merino, NP  Cardiologist:   Chilton Si, MD   No chief complaint on file.    History of Present Illness: Seth Snyder is a 59 y.o. male with atrial fibrillation, hypertension, and chronic systolic diastolic heart failure who presents for follow-up.  He was initially diagnosed with atrial fibrillation 01/2020 [facility].  He presented with hypertension and palpitations.  He had an echo 05/2019 that revealed LVEF 45 to 50% with global hypokinesis.  Right atrial pressure was 8 mmHg.  He underwent stress test 05/2019 which was suggestive of apical ischemia.  He subsequently had a left heart cath 06/2019 that revealed normal coronaries.  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  He underwent TEE/cardioversion on 04/2019 but went back into atrial fibrillation.  He was started on amiodarone.  He is been seen by Dr. Johney Frame, most recently 10/2019.  They discussed ablation in the future.  However this was deferred as he may need surgical intervention on his cervical radiculopathy.  He was in sinus rhythm at that appointment.  Since his last appointment he developed all over body itching.  He stopped Entresto and spironolactone due to worsening renal function.  He continues to have pain on his R side and burning sensation in his feet.  He is going to see his PCP tomorrow. He needs surgery on his back but needs cardiac clearance.  He occasionally checks his BP at home and it is usually 130s-140s.  He has been short of breath, especially when trying to sleep.  He denies LE edema lately.  He sometimes has swelling in his face.  He tries to walk in his home for exercise.  He has no exertional chest pain or shortness of breath.  He has palpitations about once a day.  They last up to an hour at a time.   Past Medical History:  Diagnosis Date  . Anemia   . Atrial fibrillation  (HCC)   . Hypertension   . Low back pain     Past Surgical History:  Procedure Laterality Date  . CARDIOVERSION N/A 05/10/2019   Procedure: CARDIOVERSION;  Surgeon: Lewayne Bunting, MD;  Location: [facility] ENDOSCOPY;  Service: Cardiovascular;  Laterality: N/A;  . COLONOSCOPY  07/12/2013  . LEFT HEART CATH AND CORONARY ANGIOGRAPHY N/A 08/30/2019   Procedure: LEFT HEART CATH AND CORONARY ANGIOGRAPHY;  Surgeon: Corky Crafts, MD;  Location: [facility] INVASIVE CV LAB;  Service: Cardiovascular;  Laterality: N/A;  . TEE WITHOUT CARDIOVERSION N/A 05/10/2019   Procedure: TRANSESOPHAGEAL ECHOCARDIOGRAM (TEE);  Surgeon: Lewayne Bunting, MD;  Location: [facility] ENDOSCOPY;  Service: Cardiovascular;  Laterality: N/A;     Current Outpatient Medications  Medication Sig Dispense Refill  . acetaminophen (TYLENOL) 500 MG tablet Take 500 mg by mouth daily as needed for mild pain.    Marland Kitchen amiodarone (PACERONE) 200 MG tablet Take 1 tablet (200 mg total) by mouth daily. 30 tablet 5  . apixaban (ELIQUIS) 5 MG TABS tablet TAKE 1 TABLET (5 MG TOTAL) BY MOUTH 2 (TWO) TIMES DAILY. 60 tablet 5  . atorvastatin (LIPITOR) 40 MG tablet Take 1 tablet (40 mg total) by mouth daily at 6 PM. 90 tablet 3  . citalopram (CELEXA) 20 MG tablet Take 1 tablet (20 mg total) by mouth daily. 30 tablet 5  . gabapentin (NEURONTIN) 300 MG capsule Take 2 capsules (600  mg total) by mouth at bedtime for 7 days, THEN 3 capsules (900 mg total) at bedtime. 90 capsule 3  . predniSONE (STERAPRED UNI-PAK 21 TAB) 5 MG (21) TBPK tablet Take 6 tablets first day , then one less tablet daily, 5,4,3,2,1. Take with food 21 tablet 0  . carvedilol (COREG) 25 MG tablet Take 1 tablet (25 mg total) by mouth 2 (two) times daily. 180 tablet 3  . hydrALAZINE (APRESOLINE) 50 MG tablet Take 1 tablet (50 mg total) by mouth in the morning and at bedtime. 180 tablet 3   No current facility-administered medications for this visit.    Allergies:   Entresto  [sacubitril-valsartan]    Social History:  The patient  reports that he quit smoking about 14 months ago. He has never used smokeless tobacco. He reports previous alcohol use. He reports that he does not use drugs.   Family History:  The patient's family history includes Diabetes in his mother and sister; Mental illness in his brother and sister.    ROS:  Please see the history of present illness.   Otherwise, review of systems are positive for none.   All other systems are reviewed and negative.    PHYSICAL EXAM: VS:  BP (!) 190/100   Pulse (!) 54   Ht 5\' 7"  (1.702 m)   Wt 156 lb (70.8 kg)   BMI 24.43 kg/m  , BMI Body mass index is 24.43 kg/m. GENERAL:  Clearly in pain HEENT:  Pupils equal round and reactive, fundi not visualized, oral mucosa unremarkable NECK:  No jugular venous distention, waveform within normal limits, carotid upstroke brisk and symmetric, no bruits LUNGS:  Clear to auscultation bilaterally HEART:  RRR.  PMI not displaced or sustained,S1 and S2 within normal limits, no S3, no S4, no clicks, no rubs, no murmurs ABD:  Flat, positive bowel sounds normal in frequency in pitch, no bruits, no rebound, no guarding, no midline pulsatile mass, no hepatomegaly, no splenomegaly EXT:  2 plus pulses throughout, no edema, no cyanosis no clubbing SKIN:  No rashes no nodules NEURO:  Cranial nerves II through XII grossly intact, motor grossly intact throughout PSYCH:  Cognitively intact, oriented to person place and time  EKG:  EKG is not ordered today.  LHC 08/2019:  There is severe left ventricular systolic dysfunction.  The left ventricular ejection fraction is less than 25% by visual estimate.  LV end diastolic pressure is normal. LVEDP 12 mm Hg.  There is no aortic valve stenosis.  No significant CAD.  Severe vasospasm of right radial artery, even with 4 Fr catheter. Would not use the right radial artery if cath was needed in the future.   Medical therapy for  nonischemic cardiomyopathy.   Resume Eliquis tomorrow morning.   Lexiscan Myoview 05/2019:  There was no ST segment deviation noted during stress.  Study was not gated due to atrial fibrillation  Defect 1: There is a small fixed defect of mild severity present in the apex location.  Defect 2: There is a small reversible defect of mild severity present in the basal inferior, mid inferior and apical inferior location.  Findings consistent with ischemia and prior myocardial infarction.  This is a low risk study given small area of ischemia.   Fixed defect at apex, suggests infarct.  Reversible inferior defect, suggests ischemia.  Echo 04/2019: 1. Left ventricular ejection fraction, by estimation, is 45 to 50%. The  left ventricle has mildly decreased function. The left ventricle  demonstrates global  hypokinesis. Left ventricular diastolic function could  not be evaluated.  2. Right ventricular systolic function is low normal. The right  ventricular size is normal. There is mildly elevated pulmonary artery  systolic pressure.  3. The mitral valve is grossly normal. Mild mitral valve regurgitation.  4. Tricuspid valve regurgitation is mild to moderate.  5. The aortic valve is tricuspid. Aortic valve regurgitation is mild. No  aortic stenosis is present.  6. There is mild dilatation of the ascending aorta measuring 39 mm.  7. The inferior vena cava is dilated in size with >50% respiratory  variability, suggesting right atrial pressure of 8 mmHg.   Recent Labs: 05/07/2019: B Natriuretic Peptide 733.8 05/09/2019: Magnesium 1.8 09/20/2019: ALT 33; TSH 1.496 09/26/2019: Hemoglobin 13.5; Platelets 212 01/11/2020: BUN 25; Creatinine, Seth Snyder 1.59; Potassium 4.9; Sodium 136    Lipid Panel    Component Value Date/Time   CHOL 94 (L) 08/15/2019 0925   TRIG 93 08/15/2019 0925   HDL 41 08/15/2019 0925   CHOLHDL 2.3 08/15/2019 0925   CHOLHDL 2.8 05/07/2019 0823   VLDL 17 05/07/2019 0823    LDLCALC 35 08/15/2019 0925      Wt Readings from Last 3 Encounters:  03/11/20 156 lb (70.8 kg)  12/27/19 155 lb 3.2 oz (70.4 kg)  12/12/19 157 lb (71.2 kg)      ASSESSMENT AND PLAN:  # Chronic systolic and diastolic heart failure: # Essential hypertension:  # Pre-surgical risk assessment:  LVEF was 45 to 50% on echo 04/2019.  Left heart cath revealed no coronary disease, but LVEF was less than 25%.  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  Blood pressure is extremely elevated today.  He did not tolerate Entresto or spironolactone due to worsening renal function.  For now we will switch metoprolol to carvedilol 25 mg twice daily.  We will also add hydralazine 50 mg twice daily.  I have asked him to keep tracking his blood pressure and bring his machine to follow-up.  It seems that he is report that his blood pressure is much better control but is extremely elevated here today.  He is scheduled to have an echo in a couple weeks.  If renal function is stable would add low-dose ARB.  He is euvolemic on exam.  However he cannot be cleared for surgery until his blood pressure is better controlled.  # Persistent atrial fibrillation: He remains in sinus rhythm on amiodarone.  Thyroid function and liver function were normal 09/2019.  He follows with Dr. Johney Frame and may be considered for ablation in the future.  He failed cardioversion previously.  Continue Eliquis.  Currently in sinus rhythm.   Current medicines are reviewed at length with the patient today.  The patient does not have concerns regarding medicines.  The following changes have been made: Start hydralazine.  Switch metoprolol to carvedilol.  Labs/ tests ordered today include:   No orders of the defined types were placed in this encounter.    Disposition:   FU with Seth Lacerda C. Duke Salvia, MD, [facility] in 3 months.  PharmD for BP in 1 month.     Signed, Seth Greenhouse C. Duke Salvia, MD, [facility]  03/11/2020 10:48 AM    [facility]

## 2020-03-11 NOTE — Telephone Encounter (Signed)
CSW received a call from pt [phone]) stating he had a question regarding a prescription and needed an interpreter to assist with asking. CSW was able to utilize [employer] [phone]- Nepali) to call pt back and pt shared that he went to pick up medications at the [facility] in [place] and was "given a bill". He was unable to tell this Clinical research associate which medications they were but that he cannot pay for a copay. CSW encouraged pt to figure out which medications are the ones he needs assistance with- and to bring them to the appointment tomorrow if we need to have them transitioned to another pharmacy w/ assistance program. Pt states understanding.   CSW then called [facility] and was told by pharmacy rep at [phone].  Confirmed they have filled both new prescriptions (hydralazine and carvedilol) and ran medications under [facility] Medicaid and pt has no copay at this time. Also confirmed no outstanding balance on pt account. CSW has sent message to [place] at [facility] to share this with pt that the medications are ready and he should not have any bill at this time.   Octavio Graves, MSW, LCSW [facility] Heart/Vascular Care Navigation  [phone]

## 2020-03-11 NOTE — Progress Notes (Signed)
Heart and Vascular Care Navigation  03/11/2020  Romie Leyton Magoon 1960-04-19 [id number]  Reason for Referral:  Transportation needs                                                                                                  Assessment: CSW met with pt and his [place] interpreter prior to leaving appointment. Introduced self, role, reason for visit. Pt shared via interpreter that he had been using a ride shared service through his Medicaid but found it to be unreliable, including not showing up all together. CSW explained [employer] and how it works. Pt has ride tomorrow at [facility] w/ his PCP. CSW explained that I will send referral and include that ride information.  Pt only other request was for a "belt for his back." CSW encouraged pt to speak to PCP tomorrow for appropriate DME to be ordered. CSW also shared that there is a CSW at [facility] that can further assist should any challenges w/ the ride etc arise.  Pt given my card as well as Chiropractor card.                                    HRT/VAS Care Coordination    Patients Home Cardiology Office [place] Team Social Worker   Social Worker Name: Tammy Sours- Heart and Vascular- [phone]   Living arrangements for the past 2 months [place] with: Adult Children   Patient Current Insurance Coverage Medicaid   Patient Has Concern With [place] No   Patient Concerns With Medical Bills and $20,000 in medical bills   Medical Bill Referrals: currently awaiting Medicaid decision- app submitted in February per patient   Does Patient Have Prescription Coverage? Yes   Patient Prescription Assistance Programs Blue Card   Blue Card Medications currently getting all mediations through [place] [employer] None   DME Agency NA   [facility] Agency NA      Social History:                                                                              SDOH Screenings   Alcohol Screen: Not on file  Depression (PHQ2-9): Low Risk   . PHQ-2 Score: 1  Financial Resource Strain: Not on file  Food Insecurity: No Food Insecurity  . Worried About Charity fundraiser in the Last Year: Never true  . Ran Out of Food in the Last Year: Never true  Housing: Low Risk   . Last Housing Risk Score: 0  Physical Activity: Not on file  Social Connections: Not on file  Stress:  Not on file  Tobacco Use: Medium Risk  . Smoking Tobacco Use: Former Smoker  . Smokeless Tobacco Use: Never Used  Transportation Needs: Unmet Transportation Needs  . Lack of Transportation (Medical): Yes  . Lack of Transportation (Non-Medical): Yes    SDOH Interventions: Transportation:   Transportation Interventions: Financial planner     Other Care Navigation Interventions:     Provided Pharmacy assistance resources [place] Card   Follow-up plan:   CSW sent referral along with interpreter needs and appointment info for 12/22 to [employer]. CSW also reached out to Claudie Revering at [facility] to make her aware of pt appointment/needs. She will f/u as needed tomorrow w/ pt.

## 2020-03-12 ENCOUNTER — Emergency Department (HOSPITAL_COMMUNITY): Payer: Medicaid Other

## 2020-03-12 ENCOUNTER — Emergency Department (HOSPITAL_COMMUNITY)
Admission: EM | Admit: 2020-03-12 | Discharge: 2020-03-12 | Disposition: A | Discharge status: Home / Self Care | Payer: Medicaid Other | Attending: Emergency Medicine | Admitting: Emergency Medicine

## 2020-03-12 ENCOUNTER — Ambulatory Visit (INDEPENDENT_AMBULATORY_CARE_PROVIDER_SITE_OTHER): Payer: Medicaid Other | Admitting: Nurse Practitioner

## 2020-03-12 ENCOUNTER — Encounter (HOSPITAL_COMMUNITY): Payer: Self-pay | Admitting: Emergency Medicine

## 2020-03-12 VITALS — BP 77/50

## 2020-03-12 DIAGNOSIS — I5042 Chronic combined systolic (congestive) and diastolic (congestive) heart failure: Secondary | ICD-10-CM | POA: Insufficient documentation

## 2020-03-12 DIAGNOSIS — I4819 Other persistent atrial fibrillation: Secondary | ICD-10-CM | POA: Diagnosis not present

## 2020-03-12 DIAGNOSIS — E039 Hypothyroidism, unspecified: Secondary | ICD-10-CM | POA: Insufficient documentation

## 2020-03-12 DIAGNOSIS — Z87891 Personal history of nicotine dependence: Secondary | ICD-10-CM | POA: Insufficient documentation

## 2020-03-12 DIAGNOSIS — R55 Syncope and collapse: Secondary | ICD-10-CM

## 2020-03-12 DIAGNOSIS — E860 Dehydration: Secondary | ICD-10-CM | POA: Insufficient documentation

## 2020-03-12 DIAGNOSIS — Z7901 Long term (current) use of anticoagulants: Secondary | ICD-10-CM | POA: Diagnosis not present

## 2020-03-12 DIAGNOSIS — Z79899 Other long term (current) drug therapy: Secondary | ICD-10-CM | POA: Insufficient documentation

## 2020-03-12 DIAGNOSIS — I110 Hypertensive heart disease with heart failure: Secondary | ICD-10-CM | POA: Insufficient documentation

## 2020-03-12 LAB — BASIC METABOLIC PANEL
Anion gap: 8 (ref 5–15)
BUN: 13 mg/dL (ref 6–20)
CO2: 22 mmol/L (ref 22–32)
Calcium: 8.5 mg/dL — ABNORMAL LOW (ref 8.9–10.3)
Chloride: 105 mmol/L (ref 98–111)
Creatinine, Ser: 1.15 mg/dL (ref 0.61–1.24)
GFR, Estimated: >60 mL/min (ref >60)
Glucose, Bld: 93 mg/dL (ref 70–99)
Potassium: 4.3 mmol/L (ref 3.5–5.1)
Sodium: 135 mmol/L (ref 135–145)

## 2020-03-12 LAB — CBC WITH DIFFERENTIAL/PLATELET
Abs Immature Granulocytes: 0.01 10*3/uL (ref 0.00–0.07)
Basophils Absolute: 0 10*3/uL (ref 0.0–0.1)
Basophils Relative: 1 %
Eosinophils Absolute: 0.3 10*3/uL (ref 0.0–0.5)
Eosinophils Relative: 5 %
HCT: 33.9 % — ABNORMAL LOW (ref 39.0–52.0)
Hemoglobin: 11.2 g/dL — ABNORMAL LOW (ref 13.0–17.0)
Immature Granulocytes: 0 %
Lymphocytes Relative: 21 %
Lymphs Abs: 1.2 10*3/uL (ref 0.7–4.0)
MCH: 29.8 pg (ref 26.0–34.0)
MCHC: 33 g/dL (ref 30.0–36.0)
MCV: 90.2 fL (ref 80.0–100.0)
Monocytes Absolute: 0.5 10*3/uL (ref 0.1–1.0)
Monocytes Relative: 9 %
Neutro Abs: 3.7 10*3/uL (ref 1.7–7.7)
Neutrophils Relative %: 64 %
Platelets: 136 10*3/uL — ABNORMAL LOW (ref 150–400)
RBC: 3.76 MIL/uL — ABNORMAL LOW (ref 4.22–5.81)
RDW: 13.7 % (ref 11.5–15.5)
WBC: 5.7 10*3/uL (ref 4.0–10.5)
nRBC: 0 % (ref 0.0–0.2)

## 2020-03-12 LAB — CBG MONITORING, ED: Glucose-Capillary: 88 mg/dL (ref 70–99)

## 2020-03-12 MED ORDER — SODIUM CHLORIDE 0.9 % IV BOLUS
500.0000 mL | Freq: Once | INTRAVENOUS | Status: AC
  Administered 2020-03-12: 500 mL via INTRAVENOUS

## 2020-03-12 MED ORDER — ACETAMINOPHEN 325 MG PO TABS: 650.0000 mg | ORAL_TABLET | Freq: Once | ORAL | Status: DC

## 2020-03-12 NOTE — Progress Notes (Signed)
[facility] Patient [facility] [address] [place], [place]  [address] Phone:  [phone]   Fax:  [phone]    Established Patient Office Visit  Subjective:  Patient ID: Seth Snyder, male    DOB: 1960-08-13  Age: 59 y.o. MRN: [id number]  CC: No chief complaint on file.   HPI Seth Snyder presents for follow up.  has a past medical history of Anemia, Atrial fibrillation (HCC), Hypertension, and Low back pain.   Syncope Patient experienced a syncopal episode this morning while in the lobby. He was started on carvedilol 25 mg BID and Hydralazine 50 mg BID. He suffered a sudden loss of consciousness without warning. Associated symptoms: diarrhea and nausea. The patient denies abdominal pain and excessive thirst. Medications putting patient at risk for syncope: beta blockers, vasodilator.    Past Medical History:  Diagnosis Date   Anemia    Atrial fibrillation (HCC)    Hypertension    Low back pain     Past Surgical History:  Procedure Laterality Date   CARDIOVERSION N/A 05/10/2019   Procedure: CARDIOVERSION;  Surgeon: Lewayne Bunting, MD;  Location: [facility] ENDOSCOPY;  Service: Cardiovascular;  Laterality: N/A;   COLONOSCOPY  07/12/2013   LEFT HEART CATH AND CORONARY ANGIOGRAPHY N/A 08/30/2019   Procedure: LEFT HEART CATH AND CORONARY ANGIOGRAPHY;  Surgeon: Corky Crafts, MD;  Location: [facility] INVASIVE CV LAB;  Service: Cardiovascular;  Laterality: N/A;   TEE WITHOUT CARDIOVERSION N/A 05/10/2019   Procedure: TRANSESOPHAGEAL ECHOCARDIOGRAM (TEE);  Surgeon: Lewayne Bunting, MD;  Location: [facility] ENDOSCOPY;  Service: Cardiovascular;  Laterality: N/A;    Family History  Problem Relation Age of Onset   Diabetes Mother    Diabetes Sister    Mental illness Sister    Mental illness Brother     Social History   Socioeconomic History   Marital status: Single    Spouse name: Not on file   Number of children: Not on file   Years of education: Not on  file   Highest education level: Not on file  Occupational History   Not on file  Tobacco Use   Smoking status: Former Smoker    Quit date: 12/21/2018    Years since quitting: 1.2   Smokeless tobacco: Never Used  Vaping Use   Vaping Use: Never used  Substance and Sexual Activity   Alcohol use: Not Currently   Drug use: Never   Sexual activity: Not on file  Other Topics Concern   Not on file  Social History Narrative   Not on file   Social Determinants of Health   Financial Resource Strain: Not on file  Food Insecurity: No Food Insecurity   Worried About Programme researcher, broadcasting/film/video in the Last Year: Never true   Ran Out of Food in the Last Year: Never true  Transportation Needs: Unmet Transportation Needs   Lack of Transportation (Medical): Yes   Lack of Transportation (Non-Medical): Yes  Physical Activity: Not on file  Stress: Not on file  Social Connections: Not on file  Intimate Partner Violence: Not on file    Outpatient Medications Prior to Visit  Medication Sig Dispense Refill   acetaminophen (TYLENOL) 500 MG tablet Take 500 mg by mouth daily as needed for mild pain.     amiodarone (PACERONE) 200 MG tablet Take 1 tablet (200 mg total) by mouth daily. 30 tablet 5   apixaban (ELIQUIS) 5 MG TABS tablet TAKE 1 TABLET (5 MG TOTAL) BY  MOUTH 2 (TWO) TIMES DAILY. 60 tablet 5   atorvastatin (LIPITOR) 40 MG tablet Take 1 tablet (40 mg total) by mouth daily at 6 PM. 90 tablet 3   carvedilol (COREG) 25 MG tablet Take 1 tablet (25 mg total) by mouth 2 (two) times daily. 180 tablet 3   citalopram (CELEXA) 20 MG tablet Take 1 tablet (20 mg total) by mouth daily. 30 tablet 5   gabapentin (NEURONTIN) 300 MG capsule Take 2 capsules (600 mg total) by mouth at bedtime for 7 days, THEN 3 capsules (900 mg total) at bedtime. 90 capsule 3   hydrALAZINE (APRESOLINE) 50 MG tablet Take 1 tablet (50 mg total) by mouth in the morning and at bedtime. 180 tablet 3   predniSONE  (STERAPRED UNI-PAK 21 TAB) 5 MG (21) TBPK tablet Take 6 tablets first day , then one less tablet daily, 5,4,3,2,1. Take with food 21 tablet 0   No facility-administered medications prior to visit.    Allergies  Allergen Reactions   Entresto [Sacubitril-Valsartan]     Abnormal kidney functions     ROS Review of Systems    Objective:    Physical Exam  BP (!) 77/50  Wt Readings from Last 3 Encounters:  03/11/20 156 lb (70.8 kg)  12/27/19 155 lb 3.2 oz (70.4 kg)  12/12/19 157 lb (71.2 kg)     Health Maintenance Due  Topic Date Due   Hepatitis C Screening  Never done   COLONOSCOPY  Never done   INFLUENZA VACCINE  10/21/2019   COVID-19 Vaccine (3 - Booster for Pfizer series) 01/09/2020    There are no preventive care reminders to display for this patient.  Lab Results  Component Value Date   TSH 1.496 09/20/2019   Lab Results  Component Value Date   WBC 5.7 03/12/2020   HGB 11.2 (L) 03/12/2020   HCT 33.9 (L) 03/12/2020   MCV 90.2 03/12/2020   PLT 136 (L) 03/12/2020   Lab Results  Component Value Date   NA 135 03/12/2020   K 4.3 03/12/2020   CO2 22 03/12/2020   GLUCOSE 93 03/12/2020   BUN 13 03/12/2020   CREATININE 1.15 03/12/2020   BILITOT 3.0 (H) 09/20/2019   ALKPHOS 100 09/20/2019   AST 41 09/20/2019   ALT 33 09/20/2019   PROT 7.9 09/20/2019   ALBUMIN 4.2 09/20/2019   CALCIUM 8.5 (L) 03/12/2020   ANIONGAP 8 03/12/2020   Lab Results  Component Value Date   CHOL 94 (L) 08/15/2019   Lab Results  Component Value Date   HDL 41 08/15/2019   Lab Results  Component Value Date   LDLCALC 35 08/15/2019   Lab Results  Component Value Date   TRIG 93 08/15/2019   Lab Results  Component Value Date   CHOLHDL 2.3 08/15/2019   No results found for: HGBA1C    Assessment & Plan:   Problem List Items Addressed This Visit   None   Visit Diagnoses    Syncope and collapse    -  Primary EMS was called and he was taken to [facility] ER for  further evaluation      No orders of the defined types were placed in this encounter.   Follow-up: No follow-ups on file.    Barbette Merino, NP

## 2020-03-12 NOTE — ED Triage Notes (Signed)
Pt arrives via EMS from [facility] pt center after a pain clinic visit. Pt had syncopal episode while waiting. EMS reports initial BP 70 systolic and 500 NSR given. Pt did not hit head.

## 2020-03-12 NOTE — ED Notes (Signed)
Pt ambulated in room with his walker. Pt had a steady gait. Complained of headache but states he had this headache all day.

## 2020-03-12 NOTE — Discharge Instructions (Addendum)
Make sure you are drinking plenty of fluids and eating 3 meals a day to prevent it from happening in the future. Continue your home medications as previously prescribed. Follow-up with your primary care provider. Take Tylenol as needed to help for pain. Return to the ER if you start to experience worsening pain, chest pain, shortness of breath, numbness in arms or legs.

## 2020-03-12 NOTE — ED Provider Notes (Signed)
[facility] EMERGENCY DEPARTMENT Provider Note   CSN: [id number] Arrival date & time: 03/12/20  2992     History Chief Complaint  Patient presents with  . Loss of Consciousness    Seth Snyder is a 59 y.o. male with a past medical history of A. fib on Eliquis, hypertension, heart failure with an EF of 45 to 50% presenting to the ED for syncope.  He states that he was in the waiting room this morning for his pain management appointment.  He felt dizzy and lightheaded prior to his syncopal episode.  He believes that this lasted several minutes.  Since then he has had a headache and blurry vision.  He also complains of some aching neck pain.  Denies any head injury.  He did not eat breakfast this morning but did take his usual home medications.  He reports chronic pain to the right side of his body.  He was able to stand up and ambulate after the syncopal episode.  He denies any chest pain, shortness of breath, numbness in arms or legs, vomiting, abdominal pain.  Did have some diarrhea earlier in the week which improved but then again had it today.  He denies any bloody stools.  He is unsure if he is on an anticoagulant medication.  The history is provided by the patient. The history is limited by a language barrier. A language interpreter was used.       Past Medical History:  Diagnosis Date  . Anemia   . Atrial fibrillation (HCC)   . Hypertension   . Low back pain     Patient Active Problem List   Diagnosis Date Noted  . Persistent atrial fibrillation (HCC) 09/20/2019  . Secondary hypercoagulable state (HCC) 09/20/2019  . Abnormal nuclear stress test   . Spinal stenosis of cervical region 08/13/2019  . Protrusion of cervical intervertebral disc 08/13/2019  . Shoulder pain   . Chronic combined systolic and diastolic heart failure (HCC)   . Atrial fibrillation with RVR (HCC) 05/07/2019  . Hypertension 05/07/2019  . Hypothyroidism 05/07/2019  .  Hypomagnesemia 05/07/2019  . Elevated troponin 05/07/2019    Past Surgical History:  Procedure Laterality Date  . CARDIOVERSION N/A 05/10/2019   Procedure: CARDIOVERSION;  Surgeon: Lewayne Bunting, MD;  Location: [facility] ENDOSCOPY;  Service: Cardiovascular;  Laterality: N/A;  . COLONOSCOPY  07/12/2013  . LEFT HEART CATH AND CORONARY ANGIOGRAPHY N/A 08/30/2019   Procedure: LEFT HEART CATH AND CORONARY ANGIOGRAPHY;  Surgeon: Corky Crafts, MD;  Location: [facility] INVASIVE CV LAB;  Service: Cardiovascular;  Laterality: N/A;  . TEE WITHOUT CARDIOVERSION N/A 05/10/2019   Procedure: TRANSESOPHAGEAL ECHOCARDIOGRAM (TEE);  Surgeon: Lewayne Bunting, MD;  Location: [facility] ENDOSCOPY;  Service: Cardiovascular;  Laterality: N/A;       Family History  Problem Relation Age of Onset  . Diabetes Mother   . Diabetes Sister   . Mental illness Sister   . Mental illness Brother     Social History   Tobacco Use  . Smoking status: Former Smoker    Quit date: 12/21/2018    Years since quitting: 1.2  . Smokeless tobacco: Never Used  Vaping Use  . Vaping Use: Never used  Substance Use Topics  . Alcohol use: Not Currently  . Drug use: Never    Home Medications Prior to Admission medications   Medication Sig Start Date End Date Taking? Authorizing Provider  acetaminophen (TYLENOL) 500 MG tablet Take 500 mg by mouth daily  as needed for mild pain.    [provider]  amiodarone (PACERONE) 200 MG tablet Take 1 tablet (200 mg total) by mouth daily. 12/10/19   Chilton Si, MD  apixaban (ELIQUIS) 5 MG TABS tablet TAKE 1 TABLET (5 MG TOTAL) BY MOUTH 2 (TWO) TIMES DAILY. 12/21/19   Chilton Si, MD  atorvastatin (LIPITOR) 40 MG tablet Take 1 tablet (40 mg total) by mouth daily at 6 PM. 08/27/19   Chilton Si, MD  carvedilol (COREG) 25 MG tablet Take 1 tablet (25 mg total) by mouth 2 (two) times daily. 03/11/20 06/09/20  Chilton Si, MD  citalopram (CELEXA) 20 MG tablet Take 1 tablet (20  mg total) by mouth daily. 12/10/19 03/09/20  Chilton Si, MD  gabapentin (NEURONTIN) 300 MG capsule Take 2 capsules (600 mg total) by mouth at bedtime for 7 days, THEN 3 capsules (900 mg total) at bedtime. 12/19/19 03/25/20  Barbette Merino, NP  hydrALAZINE (APRESOLINE) 50 MG tablet Take 1 tablet (50 mg total) by mouth in the morning and at bedtime. 03/11/20 06/09/20  Chilton Si, MD  predniSONE (STERAPRED UNI-PAK 21 TAB) 5 MG (21) TBPK tablet Take 6 tablets first day , then one less tablet daily, 5,4,3,2,1. Take with food 11/20/19   Eldred Manges, MD    Allergies    Sherryll Burger [sacubitril-valsartan]  Review of Systems   Review of Systems  Constitutional: Negative for appetite change and chills.  HENT: Negative for ear pain, rhinorrhea, sneezing and sore throat.   Eyes: Negative for photophobia and visual disturbance.  Respiratory: Negative for cough, chest tightness and wheezing.   Gastrointestinal: Negative for abdominal pain, blood in stool, constipation and diarrhea.  Genitourinary: Negative for dysuria, hematuria and urgency.  Musculoskeletal: Negative for myalgias.  Skin: Negative for rash.  Neurological: Positive for syncope. Negative for light-headedness.    Physical Exam Updated Vital Signs BP (!) 144/82 (BP Location: Right Arm)   Pulse 63   Temp 98.1 F (36.7 C) (Oral)   Resp 18   SpO2 96%   Physical Exam Vitals and nursing note reviewed.  Constitutional:      General: He is not in acute distress.    Appearance: He is well-developed and well-nourished.  HENT:     Head: Normocephalic and atraumatic.     Nose: Nose normal.  Eyes:     General: No scleral icterus.       Right eye: No discharge.        Left eye: No discharge.     Extraocular Movements: EOM normal.     Conjunctiva/sclera: Conjunctivae normal.     Pupils: Pupils are equal, round, and reactive to light.  Cardiovascular:     Rate and Rhythm: Normal rate and regular rhythm.     Pulses: Intact  distal pulses.     Heart sounds: Normal heart sounds. No murmur heard. No friction rub. No gallop.   Pulmonary:     Effort: Pulmonary effort is normal. No respiratory distress.     Breath sounds: Normal breath sounds.  Abdominal:     General: Bowel sounds are normal. There is no distension.     Palpations: Abdomen is soft.     Tenderness: There is no abdominal tenderness. There is no guarding.  Musculoskeletal:        General: No edema. Normal range of motion.     Cervical back: Normal range of motion and neck supple.  Skin:    General: Skin is warm and dry.  Findings: No rash.  Neurological:     Mental Status: He is alert and oriented to person, place, and time.     Cranial Nerves: No cranial nerve deficit.     Sensory: No sensory deficit.     Motor: No weakness or abnormal muscle tone.     Coordination: Coordination normal.     Comments: Moving all extremities without difficulty.  No facial asymmetry noted.  Strength 5/5 in bilateral upper and lower extremities.  No pronator drift.  Psychiatric:        Mood and Affect: Mood and affect normal.     ED Results / Procedures / Treatments   Labs (all labs ordered are listed, but only abnormal results are displayed) Labs Reviewed  BASIC METABOLIC PANEL - Abnormal; Notable for the following components:      Result Value   Calcium 8.5 (*)    All other components within normal limits  CBC WITH DIFFERENTIAL/PLATELET - Abnormal; Notable for the following components:   RBC 3.76 (*)    Hemoglobin 11.2 (*)    HCT 33.9 (*)    Platelets 136 (*)    All other components within normal limits  CBG MONITORING, ED    EKG EKG Interpretation  Date/Time:  Wednesday March 12 2020 09:18:51 EST Ventricular Rate:  60 PR Interval:    QRS Duration: 115 QT Interval:  531 QTC Calculation: 531 R Axis:   79 Text Interpretation: Sinus rhythm Incomplete right bundle branch block When compared to prior, similar apperance. No STEMI Confirmed  by Theda Belfast ([id number]) on 03/12/2020 9:25:44 AM   Radiology CT Head Wo Contrast  Result Date: 03/12/2020 CLINICAL DATA:  Headache. Syncopal episode and a fall. Initial encounter. EXAM: CT HEAD WITHOUT CONTRAST CT CERVICAL SPINE WITHOUT CONTRAST TECHNIQUE: Multidetector CT imaging of the head and cervical spine was performed following the standard protocol without intravenous contrast. Multiplanar CT image reconstructions of the cervical spine were also generated. COMPARISON:  MRI cervical spine 07/20/2019. FINDINGS: CT HEAD FINDINGS Brain: No evidence of acute infarction, hemorrhage, hydrocephalus, extra-axial collection or mass lesion/mass effect. Vascular: No hyperdense vessel or unexpected calcification. Skull: Intact.  No focal lesion. Sinuses/Orbits: There is some mucosal thickening in scattered ethmoid air cells and the maxillary sinuses, worst in the left maxillary. Other: None. CT CERVICAL SPINE FINDINGS Alignment: No listhesis. Reversal of lordosis in the upper cervical spine noted. Alignment is unchanged. Skull base and vertebrae: No acute fracture. No primary bone lesion or focal pathologic process. Soft tissues and spinal canal: No prevertebral fluid or swelling. No visible canal hematoma. Disc levels: Degenerative disc disease at C5-6 and C6-7 is noted but better demonstrated on prior MRI. Upper chest: Lung apices clear. Other: None. IMPRESSION: No acute abnormality head or cervical spine. Mild appearing sinus disease. Cervical spondylosis is better demonstrated on prior MRI. Electronically Signed   By: Drusilla Kanner M.D.   On: 03/12/2020 11:22   CT Cervical Spine Wo Contrast  Result Date: 03/12/2020 CLINICAL DATA:  Headache. Syncopal episode and a fall. Initial encounter. EXAM: CT HEAD WITHOUT CONTRAST CT CERVICAL SPINE WITHOUT CONTRAST TECHNIQUE: Multidetector CT imaging of the head and cervical spine was performed following the standard protocol without intravenous contrast.  Multiplanar CT image reconstructions of the cervical spine were also generated. COMPARISON:  MRI cervical spine 07/20/2019. FINDINGS: CT HEAD FINDINGS Brain: No evidence of acute infarction, hemorrhage, hydrocephalus, extra-axial collection or mass lesion/mass effect. Vascular: No hyperdense vessel or unexpected calcification. Skull: Intact.  No focal lesion. Sinuses/Orbits:  There is some mucosal thickening in scattered ethmoid air cells and the maxillary sinuses, worst in the left maxillary. Other: None. CT CERVICAL SPINE FINDINGS Alignment: No listhesis. Reversal of lordosis in the upper cervical spine noted. Alignment is unchanged. Skull base and vertebrae: No acute fracture. No primary bone lesion or focal pathologic process. Soft tissues and spinal canal: No prevertebral fluid or swelling. No visible canal hematoma. Disc levels: Degenerative disc disease at C5-6 and C6-7 is noted but better demonstrated on prior MRI. Upper chest: Lung apices clear. Other: None. IMPRESSION: No acute abnormality head or cervical spine. Mild appearing sinus disease. Cervical spondylosis is better demonstrated on prior MRI. Electronically Signed   By: Drusilla Kanner M.D.   On: 03/12/2020 11:22    Procedures Procedures (including critical care time)  Medications Ordered in ED Medications  acetaminophen (TYLENOL) tablet 650 mg (has no administration in time range)  sodium chloride 0.9 % bolus 500 mL (500 mLs Intravenous New Bag/Given 03/12/20 1312)    ED Course  I have reviewed the triage vital signs and the nursing notes.  Pertinent labs & imaging results that were available during my care of the patient were reviewed by me and considered in my medical decision making (see chart for details).  Clinical Course as of 03/12/20 1505  Wed Mar 12, 2020  1022 Creatinine: 1.15 [HK]  1022 Glucose-Capillary: 88 [HK]  1040 Orthostatic Lying  BP- Lying:142/80 Pulse- Lying:60 Orthostatic Sitting BP- Sitting:125/87  Pulse- Sitting:61 Orthostatic Standing at 0 minutes BP- Standing at 0 minutes:117/76 Pulse- Standing at 0 minutes:62 [HK]    Clinical Course User Index [HK] Dietrich Pates, PA-C   MDM Rules/Calculators/A&P                          59 year old male with past medical history of A. fib on Eliquis, hypertension, heart failure with an EF of 45 to 50% seen on echo this year presenting to the ED for syncope.  He did not eat breakfast this morning, states that he had a pain management appointment and while in the waiting room had a syncopal episode.  He had a preceding dizziness and lightheadedness.  After the syncopal episode he reports having a headache and blurry vision.  Reports some aching neck pain.  Denies any head injury.  States that this happened to him similarly when he was younger.  No chest pain, shortness of breath, neck stiffness, fever.  Reports intermittent diarrhea for the past few days as well.  On exam abdomen is soft, nontender nondistended.  No significant tenderness of the cervical spine.  No neurological deficits, no numbness or weakness noted on exam.  Med review shows that he does take Eliquis for his A. fib.  No external signs of trauma.  Orthostatic vital signs concerning for dehydration.  CBC, BMP unremarkable.  EKG shows sinus rhythm, no changes from prior tracings, no STEMI.  CT of the head and cervical spine without any acute abnormalities.  Patient given Tylenol here, IV fluids additionally with improvement in his symptoms.  He is able to ambulate without difficulty.  Suspect syncope related to lack of p.o. intake as a cause of his symptoms.  Doubt stroke, ACS, arrhythmia, sepsis, intracranial hemorrhage or other emergent cause of symptoms.  We will have him follow-up with PCP and return for worsening symptoms.   Patient is hemodynamically stable, in NAD, and able to ambulate in the ED. Evaluation does not show pathology that would require  ongoing emergent intervention or  inpatient treatment. I explained the diagnosis to the patient. Pain has been managed and has no complaints prior to discharge. Patient is comfortable with above plan and is stable for discharge at this time. All questions were answered prior to disposition. Strict return precautions for returning to the ED were discussed. Encouraged follow up with PCP.   An After Visit Summary was printed and given to the patient.   Portions of this note were generated with Scientist, clinical (histocompatibility and immunogenetics). Dictation errors may occur despite best attempts at proofreading.  Final Clinical Impression(s) / ED Diagnoses Final diagnoses:  Dehydration  Syncope, unspecified syncope type    Rx / DC Orders ED Discharge Orders    None       Dietrich Pates, PA-C 03/12/20 1505    Tegeler, Canary Brim, MD 03/12/20 [phone]

## 2020-03-14 ENCOUNTER — Encounter: Payer: Self-pay | Admitting: Nurse Practitioner

## 2020-03-14 NOTE — Patient Instructions (Signed)
Syncope  Syncope refers to a condition in which a person temporarily loses consciousness. Syncope may also be called fainting or passing out. It is caused by a sudden decrease in blood flow to the brain. Even though most causes of syncope are not dangerous, syncope can be a sign of a serious medical problem. Your health care provider may do tests to find the reason why you are having syncope. Signs that you may be about to faint include:  Feeling dizzy or light-headed.  Feeling nauseous.  Seeing all white or all black in your field of vision.  Having cold, clammy skin. If you faint, get medical help right away. Call your local emergency services (911 in the [place] Do not drive yourself to the hospital. Follow these instructions at home: Pay attention to any changes in your symptoms. Take these actions to stay safe and to help relieve your symptoms: Lifestyle  Do not drive, use machinery, or play sports until your health care provider says it is okay.  Do not drink alcohol.  Do not use any products that contain nicotine or tobacco, such as cigarettes and e-cigarettes. If you need help quitting, ask your health care provider.  Drink enough fluid to keep your urine pale yellow. General instructions  Take over-the-counter and prescription medicines only as told by your health care provider.  If you are taking blood pressure or heart medicine, get up slowly and take several minutes to sit and then stand. This can reduce dizziness or light-headedness.  Have someone stay with you until you feel stable.  If you start to feel like you might faint, lie down right away and raise (elevate) your feet above the level of your heart. Breathe deeply and steadily. Wait until all the symptoms have passed.  Keep all follow-up visits as told by your health care provider. This is important. Get help right away if you:  Have a severe headache.  Faint once or repeatedly.  Have pain in your chest,  abdomen, or back.  Have a very fast or irregular heartbeat (palpitations).  Have pain when you breathe.  Are bleeding from your mouth or rectum, or you have black or tarry stool.  Have a seizure.  Are confused.  Have trouble walking.  Have severe weakness.  Have vision problems. These symptoms may represent a serious problem that is an emergency. Do not wait to see if your symptoms will go away. Get medical help right away. Call your local emergency services (911 in the [place] Do not drive yourself to the hospital. Summary  Syncope refers to a condition in which a person temporarily loses consciousness. It is caused by a sudden decrease in blood flow to the brain.  Signs that you may be about to faint include dizziness, feeling light-headed, feeling nauseous, sudden vision changes, or cold, clammy skin.  Although most causes of syncope are not dangerous, syncope can be a sign of a serious medical problem. If you faint, get medical help right away. This information is not intended to replace advice given to you by your health care provider. Make sure you discuss any questions you have with your health care provider. Document Revised: 02/18/2017 Document Reviewed: 02/14/2017 Elsevier Patient Education  2020 Elsevier Inc.  

## 2020-03-24 ENCOUNTER — Ambulatory Visit: Payer: Medicaid Other | Admitting: Internal Medicine

## 2020-03-26 ENCOUNTER — Other Ambulatory Visit: Payer: Self-pay

## 2020-03-26 ENCOUNTER — Ambulatory Visit (INDEPENDENT_AMBULATORY_CARE_PROVIDER_SITE_OTHER): Payer: Medicaid Other | Admitting: Internal Medicine

## 2020-03-26 ENCOUNTER — Ambulatory Visit (HOSPITAL_COMMUNITY): Payer: Medicaid Other | Attending: Cardiology

## 2020-03-26 ENCOUNTER — Encounter: Payer: Self-pay | Admitting: Internal Medicine

## 2020-03-26 VITALS — BP 140/78 | HR 59 | Ht 67.0 in | Wt 152.0 lb

## 2020-03-26 DIAGNOSIS — I5042 Chronic combined systolic (congestive) and diastolic (congestive) heart failure: Secondary | ICD-10-CM | POA: Diagnosis not present

## 2020-03-26 DIAGNOSIS — I5022 Chronic systolic (congestive) heart failure: Secondary | ICD-10-CM

## 2020-03-26 DIAGNOSIS — D6869 Other thrombophilia: Secondary | ICD-10-CM

## 2020-03-26 DIAGNOSIS — I4819 Other persistent atrial fibrillation: Secondary | ICD-10-CM | POA: Diagnosis not present

## 2020-03-26 LAB — ECHOCARDIOGRAM COMPLETE
Area-P 1/2: 2.83 cm2
P 1/2 time: 458 msec
S' Lateral: 3.3 cm

## 2020-03-26 NOTE — Progress Notes (Signed)
PCP: Barbette Merino, NP Primary Cardiologist: Dr Duke Salvia Primary EP: Dr Johney Frame  Seth Snyder is a 60 y.o. male who presents today for routine electrophysiology followup.  Since last being seen in our clinic, the patient reports doing reasonably well.  He is followed with Dr Duke Salvia for hypertensive cardiomyopathy.  He was in the ED 03/12/20 with syncope.  He was orthostatic and felt to be dehydrated as the cause.  his primary concern today is with chronic R neck/ arm/ leg pain.  He has been evaluated by ortho and has preferred medical therapy.  Today, he denies symptoms of palpitations, chest pain, shortness of breath,  lower extremity edema or further syncope.  The patient is otherwise without complaint today.   Past Medical History:  Diagnosis Date  . Anemia   . Atrial fibrillation (HCC)   . Hypertension   . Low back pain    Past Surgical History:  Procedure Laterality Date  . CARDIOVERSION N/A 05/10/2019   Procedure: CARDIOVERSION;  Surgeon: Lewayne Bunting, MD;  Location: [facility] ENDOSCOPY;  Service: Cardiovascular;  Laterality: N/A;  . COLONOSCOPY  07/12/2013  . LEFT HEART CATH AND CORONARY ANGIOGRAPHY N/A 08/30/2019   Procedure: LEFT HEART CATH AND CORONARY ANGIOGRAPHY;  Surgeon: Corky Crafts, MD;  Location: [facility] INVASIVE CV LAB;  Service: Cardiovascular;  Laterality: N/A;  . TEE WITHOUT CARDIOVERSION N/A 05/10/2019   Procedure: TRANSESOPHAGEAL ECHOCARDIOGRAM (TEE);  Surgeon: Lewayne Bunting, MD;  Location: [facility] ENDOSCOPY;  Service: Cardiovascular;  Laterality: N/A;    ROS- all systems are reviewed and negatives except as per HPI above  Current Outpatient Medications  Medication Sig Dispense Refill  . acetaminophen (TYLENOL) 500 MG tablet Take 500 mg by mouth daily as needed for mild pain.    Marland Kitchen amiodarone (PACERONE) 200 MG tablet Take 1 tablet (200 mg total) by mouth daily. 30 tablet 5  . apixaban (ELIQUIS) 5 MG TABS tablet TAKE 1 TABLET (5 MG TOTAL) BY MOUTH 2 (TWO)  TIMES DAILY. 60 tablet 5  . atorvastatin (LIPITOR) 40 MG tablet Take 1 tablet (40 mg total) by mouth daily at 6 PM. 90 tablet 3  . carvedilol (COREG) 25 MG tablet Take 1 tablet (25 mg total) by mouth 2 (two) times daily. 180 tablet 3  . citalopram (CELEXA) 20 MG tablet Take 1 tablet (20 mg total) by mouth daily. 30 tablet 5  . gabapentin (NEURONTIN) 300 MG capsule Take 2 capsules (600 mg total) by mouth at bedtime for 7 days, THEN 3 capsules (900 mg total) at bedtime. 90 capsule 3  . hydrALAZINE (APRESOLINE) 50 MG tablet Take 1 tablet (50 mg total) by mouth in the morning and at bedtime. 180 tablet 3  . predniSONE (STERAPRED UNI-PAK 21 TAB) 5 MG (21) TBPK tablet Take 6 tablets first day , then one less tablet daily, 5,4,3,2,1. Take with food 21 tablet 0   No current facility-administered medications for this visit.    Physical Exam: Vitals:   03/26/20 1455  BP: 140/78  Pulse: (!) 59  SpO2: 97%  Weight: 152 lb (68.9 kg)  Height: 5\' 7"  (1.702 m)    GEN- The patient is well appearing, alert  Head- normocephalic, atraumatic Eyes-  Sclera clear, conjunctiva pink Ears- hearing intact Oropharynx- clear Lungs- Clear to ausculation bilaterally, normal work of breathing Heart- Regular rate and rhythm, no murmurs, rubs or gallops, PMI not laterally displaced GI- soft, NT, ND, + BS Extremities- no clubbing, cyanosis, or edema  Wt Readings from  Last 3 Encounters:  03/26/20 152 lb (68.9 kg)  03/11/20 156 lb (70.8 kg)  12/27/19 155 lb 3.2 oz (70.4 kg)     EKG tracing ordered today is personally reviewed and shows sinus rhythm  Assessment and Plan:  1. Persistent atrial fibrillation/ atrial flutter The patient has symptomatic, recurrent atrial fibrillation. He appears to be maintaining sinus rhythm with amiodarone. We will need to follow him closely on this medicine to avoid toxicity. I would not advise ablation until his hypertension improves and his cardiomyopathy is stable.   Ultimately, he may do best to stay on amiodarone long term.  If no afib in 6 months, consider reducing dose to 100mg  daily  2. HTN Stable No change required today  3. Chronic systolic and diastolic dysfunction Stable No change required today Echo pending today Per Dr (note reviewed), felt to have a hypertensive cardiomyopathy Did not tolerate entresto. Continue to follow closely with Dr Duke Salvia for medicine optimization.  I would not advise ICD at this time.  We could reconsider once his chronic pain issues are improved.  Risks, benefits and potential toxicities for medications prescribed and/or refilled reviewed with patient today.   Follow-up with Dr Duke Salvia as scheduled Return to see EP PA in 6 months  Duke Salvia MD, [facility] 03/26/2020 3:19 PM

## 2020-03-26 NOTE — Patient Instructions (Addendum)
Medication Instructions:  Your physician recommends that you continue on your current medications as directed. Please refer to the Current Medication list given to you today.  *If you need a refill on your cardiac medications before your next appointment, please call your pharmacy*  Lab Work: None ordered.  If you have labs (blood work) drawn today and your tests are completely normal, you will receive your results only by: Marland Kitchen MyChart Message (if you have MyChart) OR . A paper copy in the mail If you have any lab test that is abnormal or we need to change your treatment, we will call you to review the results.  Testing/Procedures: None ordered.  Follow-Up: At [facility], you and your health needs are our priority.  As part of our continuing mission to provide you with exceptional heart care, we have created designated Provider Care Teams.  These Care Teams include your primary Cardiologist (physician) and Advanced Practice Providers (APPs -  Physician Assistants and Nurse Practitioners) who all work together to provide you with the care you need, when you need it.  We recommend signing up for the patient portal called "MyChart".  Sign up information is provided on this After Visit Summary.  MyChart is used to connect with patients for Virtual Visits (Telemedicine).  Patients are able to view lab/test results, encounter notes, upcoming appointments, etc.  Non-urgent messages can be sent to your provider as well.   To learn more about what you can do with MyChart, go to [email or web address].    Your next appointment:   Your physician wants you to follow-up in: 6 months with Seth Snyder.  You will receive a reminder letter in the mail two months in advance. If you don't receive a letter, please call our office to schedule the follow-up appointment.    Other Instructions:

## 2020-03-27 ENCOUNTER — Other Ambulatory Visit: Payer: Self-pay

## 2020-03-27 DIAGNOSIS — F32A Depression, unspecified: Secondary | ICD-10-CM

## 2020-03-27 MED ORDER — CITALOPRAM HYDROBROMIDE 20 MG PO TABS: 20.0000 mg | ORAL_TABLET | Freq: Every day | ORAL | 5 refills | Status: DC

## 2020-03-30 ENCOUNTER — Other Ambulatory Visit: Payer: Self-pay | Admitting: Cardiovascular Disease

## 2020-03-31 ENCOUNTER — Encounter: Payer: Self-pay | Admitting: Nurse Practitioner

## 2020-03-31 ENCOUNTER — Other Ambulatory Visit: Payer: Self-pay

## 2020-03-31 ENCOUNTER — Ambulatory Visit (INDEPENDENT_AMBULATORY_CARE_PROVIDER_SITE_OTHER): Payer: Medicaid Other | Admitting: Nurse Practitioner

## 2020-03-31 VITALS — BP 112/70 | HR 60 | Temp 99.1°F | Ht 67.0 in | Wt 159.2 lb

## 2020-03-31 DIAGNOSIS — F32A Depression, unspecified: Secondary | ICD-10-CM

## 2020-03-31 DIAGNOSIS — Z23 Encounter for immunization: Secondary | ICD-10-CM

## 2020-03-31 DIAGNOSIS — G8929 Other chronic pain: Secondary | ICD-10-CM

## 2020-03-31 DIAGNOSIS — M5441 Lumbago with sciatica, right side: Secondary | ICD-10-CM

## 2020-03-31 DIAGNOSIS — R202 Paresthesia of skin: Secondary | ICD-10-CM

## 2020-03-31 DIAGNOSIS — R200 Anesthesia of skin: Secondary | ICD-10-CM | POA: Diagnosis not present

## 2020-03-31 MED ORDER — GABAPENTIN 300 MG PO CAPS: 900.0000 mg | ORAL_CAPSULE | Freq: Every day | ORAL | 11 refills | Status: DC

## 2020-03-31 NOTE — Progress Notes (Signed)
[facility] Patient [address] [facility] [address] [place], [place]  [address] Phone:  [phone]   Fax:  [phone]   Established Patient Office Visit  Subjective:  Patient ID: Seth Snyder, male    DOB: 06-Apr-1960  Age: 60 y.o. MRN: [id number]  CC:  Chief Complaint  Patient presents with  . Follow-up    Follow up, pain head down numbness and tingling     HPI Seth Snyder presents for follow up. He  has a past medical history of Anemia, Atrial fibrillation (HCC), Hypertension, and Low back pain.   Patient is in today for hospital follow-up. Recently admitted for syncope and dehydration.  He was started on new antihypertensive medication the day before.  He was hydrated in the emergency department and discharged home.  He later followed up with cardiology.     He continues have periods of dizziness. He had not had any additional falls or syncopal episodes.  He did fall back on his sofa last week. He admits that he BP is fluctuates up and down. 149/?  to 87/?. He has had a BP of 179.  He denies any chest pains or shortness of breath.  He denies any any nausea with dizziness. He denies any vomiting. He is does check his BP with the dizzy spells and it varies. Asked if BP is ever low in 80/50. He admits that had 60. However he does not remember the full BP. He will bring his BP monitor on the 04/11/20 office visit with specialist.   He is currently being treated for depression.  He is currently on citalopram 20 mg daily he admits that his symptoms have improved.  He does continue to think about a lot of things.  He is ready to have surgery on his neck to help relieve his pain.  He also complains of lower back pain on the left side.  He is requesting a back brace today along with TED hose.  He does continue to take the gabapentin 300 mg nightly for numbness and tingling which is effective overall.  Past Medical History:  Diagnosis Date  . Anemia   . Atrial fibrillation (HCC)   .  Hypertension   . Low back pain     Past Surgical History:  Procedure Laterality Date  . CARDIOVERSION N/A 05/10/2019   Procedure: CARDIOVERSION;  Surgeon: Lewayne Bunting, MD;  Location: [facility] ENDOSCOPY;  Service: Cardiovascular;  Laterality: N/A;  . COLONOSCOPY  07/12/2013  . LEFT HEART CATH AND CORONARY ANGIOGRAPHY N/A 08/30/2019   Procedure: LEFT HEART CATH AND CORONARY ANGIOGRAPHY;  Surgeon: Corky Crafts, MD;  Location: [facility] INVASIVE CV LAB;  Service: Cardiovascular;  Laterality: N/A;  . TEE WITHOUT CARDIOVERSION N/A 05/10/2019   Procedure: TRANSESOPHAGEAL ECHOCARDIOGRAM (TEE);  Surgeon: Lewayne Bunting, MD;  Location: [facility] ENDOSCOPY;  Service: Cardiovascular;  Laterality: N/A;    Family History  Problem Relation Age of Onset  . Diabetes Mother   . Diabetes Sister   . Mental illness Sister   . Mental illness Brother     Social History   Socioeconomic History  . Marital status: Single    Spouse name: Not on file  . Number of children: Not on file  . Years of education: Not on file  . Highest education level: Not on file  Occupational History  . Not on file  Tobacco Use  . Smoking status: Former Smoker    Quit date: 12/21/2018    Years since  quitting: 1.2  . Smokeless tobacco: Never Used  Vaping Use  . Vaping Use: Never used  Substance and Sexual Activity  . Alcohol use: Not Currently  . Drug use: Never  . Sexual activity: Not on file  Other Topics Concern  . Not on file  Social History Narrative  . Not on file   Social Determinants of Health   Financial Resource Strain: Not on file  Food Insecurity: No Food Insecurity  . Worried About Programme researcher, broadcasting/film/video in the Last Year: Never true  . Ran Out of Food in the Last Year: Never true  Transportation Needs: Unmet Transportation Needs  . Lack of Transportation (Medical): Yes  . Lack of Transportation (Non-Medical): Yes  Physical Activity: Not on file  Stress: Not on file  Social Connections: Not on file   Intimate Partner Violence: Not on file    Outpatient Medications Prior to Visit  Medication Sig Dispense Refill  . acetaminophen (TYLENOL) 500 MG tablet Take 500 mg by mouth daily as needed for mild pain.    Marland Kitchen amiodarone (PACERONE) 200 MG tablet TAKE 1 TABLET BY MOUTH EVERY DAY 90 tablet 1  . apixaban (ELIQUIS) 5 MG TABS tablet TAKE 1 TABLET (5 MG TOTAL) BY MOUTH 2 (TWO) TIMES DAILY. 60 tablet 5  . atorvastatin (LIPITOR) 40 MG tablet Take 1 tablet (40 mg total) by mouth daily at 6 PM. 90 tablet 3  . carvedilol (COREG) 25 MG tablet Take 1 tablet (25 mg total) by mouth 2 (two) times daily. 180 tablet 3  . citalopram (CELEXA) 20 MG tablet Take 1 tablet (20 mg total) by mouth daily. 30 tablet 5  . hydrALAZINE (APRESOLINE) 50 MG tablet Take 1 tablet (50 mg total) by mouth in the morning and at bedtime. 180 tablet 3  . predniSONE (STERAPRED UNI-PAK 21 TAB) 5 MG (21) TBPK tablet Take 6 tablets first day , then one less tablet daily, 5,4,3,2,1. Take with food (Patient not taking: Reported on 03/31/2020) 21 tablet 0  . gabapentin (NEURONTIN) 300 MG capsule Take 2 capsules (600 mg total) by mouth at bedtime for 7 days, THEN 3 capsules (900 mg total) at bedtime. 90 capsule 3   No facility-administered medications prior to visit.    Allergies  Allergen Reactions  . Entresto [Sacubitril-Valsartan]     Abnormal kidney functions     ROS Review of Systems  Constitutional: Negative.   Eyes: Negative.   Respiratory: Negative for shortness of breath (noted on occasions).   Cardiovascular:       Increased heart rate varies  Gastrointestinal: Positive for nausea (with dizziness). Negative for vomiting.  Endocrine: Negative.   Musculoskeletal: Positive for back pain and neck pain.       Right sided groin pain  Neurological:       Tingling in both legs ankles to feet also in lower back and groin   He also has tingling in the neck area      Objective:    Physical Exam Constitutional:       General: He is not in acute distress.    Appearance: He is not ill-appearing, toxic-appearing or diaphoretic.  HENT:     Head: Normocephalic and atraumatic.  Cardiovascular:     Rate and Rhythm: Normal rate and regular rhythm.     Pulses: Normal pulses.     Heart sounds: Normal heart sounds.  Pulmonary:     Effort: Pulmonary effort is normal.     Breath sounds: Normal breath  sounds.  Abdominal:     Palpations: Abdomen is soft.  Musculoskeletal:        General: Tenderness (Right groin area figure-of-four positive) present.     Cervical back: Normal range of motion. Tenderness present.  Skin:    General: Skin is warm and dry.     Capillary Refill: Capillary refill takes less than 2 seconds.  Neurological:     General: No focal deficit present.     Mental Status: He is alert and oriented to person, place, and time.  Psychiatric:        Behavior: Behavior normal.     BP 112/70   Pulse 60   Temp 99.1 F (37.3 C) (Temporal)   Ht 5\' 7"  (1.702 m)   Wt 159 lb 3.2 oz (72.2 kg)   SpO2 96%   BMI 24.93 kg/m  Wt Readings from Last 3 Encounters:  03/31/20 159 lb 3.2 oz (72.2 kg)  03/26/20 152 lb (68.9 kg)  03/11/20 156 lb (70.8 kg)     Health Maintenance Due  Topic Date Due  . INFLUENZA VACCINE  10/21/2019    There are no preventive care reminders to display for this patient.  Lab Results  Component Value Date   TSH 1.496 09/20/2019   Lab Results  Component Value Date   WBC 5.7 03/12/2020   HGB 11.2 (L) 03/12/2020   HCT 33.9 (L) 03/12/2020   MCV 90.2 03/12/2020   PLT 136 (L) 03/12/2020   Lab Results  Component Value Date   NA 135 03/12/2020   K 4.3 03/12/2020   CO2 22 03/12/2020   GLUCOSE 93 03/12/2020   BUN 13 03/12/2020   CREATININE 1.15 03/12/2020   BILITOT 3.0 (H) 09/20/2019   ALKPHOS 100 09/20/2019   AST 41 09/20/2019   ALT 33 09/20/2019   PROT 7.9 09/20/2019   ALBUMIN 4.2 09/20/2019   CALCIUM 8.5 (L) 03/12/2020   ANIONGAP 8 03/12/2020   Lab  Results  Component Value Date   CHOL 94 (L) 08/15/2019   Lab Results  Component Value Date   HDL 41 08/15/2019   Lab Results  Component Value Date   LDLCALC 35 08/15/2019   Lab Results  Component Value Date   TRIG 93 08/15/2019   Lab Results  Component Value Date   CHOLHDL 2.3 08/15/2019   No results found for: HGBA1C    Assessment & Plan:   Problem List Items Addressed This Visit   None   Visit Diagnoses    Numbness and tingling of right upper extremity    -  Primary Persistent tried to reassure patient that once he is stable then will proceed with his surgery   Relevant Medications   gabapentin (NEURONTIN) 300 MG capsule   Other Relevant Orders   Compression stockings   Flu vaccine need       Chronic bilateral low back pain with right-sided sciatica     Persistent back brace ordered   Relevant Medications   gabapentin (NEURONTIN) 300 MG capsule   Other Relevant Orders   Outside Vendor Brace   Depression, unspecified depression type     Stable with current regimen      Meds ordered this encounter  Medications  . gabapentin (NEURONTIN) 300 MG capsule    Sig: Take 3 capsules (900 mg total) by mouth at bedtime.    Dispense:  90 capsule    Refill:  11    Order Specific Question:   Supervising Provider    Answer:  Quentin Angst [[id number]]    Follow-up: Return in about 3 months (around 06/29/2020).    Barbette Merino, NP

## 2020-04-11 ENCOUNTER — Ambulatory Visit (INDEPENDENT_AMBULATORY_CARE_PROVIDER_SITE_OTHER): Payer: Medicaid Other | Admitting: Pharmacist Clinician (PhC)/ Clinical Pharmacy Specialist

## 2020-04-11 ENCOUNTER — Other Ambulatory Visit: Payer: Self-pay

## 2020-04-11 DIAGNOSIS — I10 Essential (primary) hypertension: Secondary | ICD-10-CM

## 2020-04-11 MED ORDER — HYDRALAZINE HCL 50 MG PO TABS: ORAL_TABLET | ORAL | 3 refills | Status: DC

## 2020-04-11 NOTE — Assessment & Plan Note (Signed)
Patient with essential hypertension, doing better, but still having large swings in pressure during the day.  Because of 50-60 point drops from morning to noon, will have him cut back on the hydralazine to 25mg  each morning.  He should continue the full 50 mg each evening.  He was asked to continue with twice daily monitoring and return in one month for follow up.

## 2020-04-11 NOTE — Progress Notes (Signed)
04/11/2020 Krystofer [place] Lacap 20-Oct-1960 [id number]   HPI:  Seth Snyder is a 60 y.o. male patient of Dr Duke Salvia, with a PMH below who presents today for hypertension clinic evaluation.  She saw him last in December at which time his blood pressure was elevated to 190/100.  She switched metoprolol to carvedilol and added hydralazine 50 mg twice daily.  He was in the ED on Dec 22 after a syncopal episode while at a pain management appointment (he did not eat that morning).    He then saw Dr. Johney Frame the first week of January at which time pressure was down to 140/78.  He is here today with an interpreter from [facility] services [facility]).  He states that he has not had any issues with the changes in medications from Dr. Duke Salvia.  He has been having ongoing problems with rash and itchy skin, states it continues to be bothersome.  He doesn't appear to have any signs of rash on his legs, however does have several scabs from areas of scratching.  He also notes that he continues to have some SOB off and on most days.  States will start with a strange sensation near his stomach then he feels tightness in his chest for about an hour.  It will abate then repeat a few hours later.    Currently takes twice daily medications at 8 am and 6 pm, takes once daily meds at noon.  No issues with compliance.  He had previously been on Entresto and spironolactone for HF, however his SCr increase to 1.59. 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  His home readings don't have any times associated with them, and range from 76-168/52-91.  An average of readings is somewhat meaningless, as on multiple days in the past 2 weeks he will drop 50-60 points systolic from early morning to noontime readings.  After that number increased thru the day and last reading is usually WNL 110-130 systolic.    Intolerances: sacubitril/valsartan - increase in SCr, spironolactone - increase in SCr  Labs: 12/21  Na 135, K 4.3, Glu 93, BUN 13, SCr 1.15 GFR > 60  Wt Readings from Last 3 Encounters:  03/31/20 159 lb 3.2 oz (72.2 kg)  03/26/20 152 lb (68.9 kg)  03/11/20 156 lb (70.8 kg)   BP Readings from Last 3 Encounters:  04/11/20 (!) 146/82  03/31/20 112/70  03/26/20 140/78   Pulse Readings from Last 3 Encounters:  04/11/20 (!) 56  03/31/20 60  03/26/20 (!) 59    Current Outpatient Medications  Medication Sig Dispense Refill   amiodarone (PACERONE) 200 MG tablet TAKE 1 TABLET BY MOUTH EVERY DAY 90 tablet 1   apixaban (ELIQUIS) 5 MG TABS tablet TAKE 1 TABLET (5 MG TOTAL) BY MOUTH 2 (TWO) TIMES DAILY. 60 tablet 5  atorvastatin (LIPITOR) 40 MG tablet Take 1 tablet (40 mg total) by mouth daily at 6 PM. 90 tablet 3   carvedilol (COREG) 25 MG tablet Take 1 tablet (25 mg total) by mouth 2 (two) times daily. 180 tablet 3   citalopram (CELEXA) 20 MG tablet Take 1 tablet (20 mg total) by mouth daily. 30 tablet 5   gabapentin (NEURONTIN) 300 MG capsule Take 3 capsules (900 mg total) by mouth at bedtime. 90 capsule 11   hydrALAZINE (APRESOLINE) 50 MG tablet Take 1/2 tablet each morning and 1 tablet each evening. 270 tablet 3   acetaminophen (TYLENOL) 500 MG tablet Take 500 mg by mouth daily as needed for mild pain.     predniSONE (STERAPRED UNI-PAK 21 TAB) 5 MG (21) TBPK tablet Take 6 tablets first day , then one less tablet daily,  5,4,3,2,1. Take with food (Patient not taking: Reported on 03/31/2020) 21 tablet 0   No current facility-administered medications for this visit.    Allergies  Allergen Reactions   Entresto [Sacubitril-Valsartan]     Abnormal kidney functions     Past Medical History:  Diagnosis Date   Anemia    Atrial fibrillation (HCC)    Hypertension    Low back pain     Blood pressure (!) 146/82, pulse (!) 56.  Hypertension Patient with essential hypertension, doing better, but still having large swings in pressure during the day.  Because of 50-60 point drops from morning to noon, will have him cut back on the hydralazine to 25mg  each morning.  He should continue the full 50 mg each evening.  He was asked to continue with twice daily monitoring and return in one month for follow up.     PharmD CPP [facility] [address] [place], [place] [phone]

## 2020-04-11 NOTE — Patient Instructions (Signed)
Return for a a follow up appointment February 17 at 3 pm  Check your blood pressure at home daily (if able) and keep record of the readings.  Take your BP meds as follows:  Cut morning dose of hydralazine to 25 mg, continue 50 mg in the evenings  Continue with all other medications  Bring all of your meds, your BP cuff and your record of home blood pressures to your next appointment.  Exercise as youre able, try to walk approximately 30 minutes per day.  Keep salt intake to a minimum, especially watch canned and prepared boxed foods.  Eat more fresh fruits and vegetables and fewer canned items.  Avoid eating in fast food restaurants.    HOW TO TAKE YOUR BLOOD PRESSURE:  Rest 5 minutes before taking your blood pressure.   Dont smoke or drink caffeinated beverages for at least 30 minutes before.  Take your blood pressure before (not after) you eat.  Sit comfortably with your back supported and both feet on the floor (dont cross your legs).  Elevate your arm to heart level on a table or a desk.  Use the proper sized cuff. It should fit smoothly and snugly around your bare upper arm. There should be enough room to slip a fingertip under the cuff. The bottom edge of the cuff should be 1 inch above the crease of the elbow.  Ideally, take 3 measurements at one sitting and record the average.

## 2020-04-15 ENCOUNTER — Encounter (HOSPITAL_COMMUNITY): Payer: Self-pay | Admitting: Psychiatry

## 2020-04-15 ENCOUNTER — Other Ambulatory Visit: Payer: Self-pay

## 2020-04-15 ENCOUNTER — Ambulatory Visit (INDEPENDENT_AMBULATORY_CARE_PROVIDER_SITE_OTHER): Payer: Medicaid Other | Admitting: Psychiatry

## 2020-04-15 DIAGNOSIS — F331 Major depressive disorder, recurrent, moderate: Secondary | ICD-10-CM | POA: Diagnosis not present

## 2020-04-15 MED ORDER — CITALOPRAM HYDROBROMIDE 40 MG PO TABS
40.0000 mg | ORAL_TABLET | Freq: Every day | ORAL | 1 refills | Status: DC
Start: 2020-04-15 — End: 2020-05-13

## 2020-04-15 NOTE — Progress Notes (Signed)
Psychiatric Initial Adult Assessment   Patient Identification: Seth Snyder MRN:  [id number] Date of Evaluation:  04/15/2020   Referral Source: PCP, NP Ms. Brooke Dare  The session was conducted in [place] with help of Nepali speaking interpreter via [employer] services.  Chief Complaint:   " I am in a lot of a pain especially on my right side."  Visit Diagnosis:    ICD-10-CM   1. MDD (major depressive disorder), recurrent episode, moderate (HCC)  F33.1 citalopram (CELEXA) 40 MG tablet    History of Present Illness: This is a 60 year old male with history of hypertension, atrial fibrillation, anemia, chronic back pain secondary to spinal stenosis with compression of right side with C7 radiculopathy.  Seen for evaluation after being referred by his PCP.  Patient has been diagnosed with depression anxiety and was started on Celexa a few months ago.  He is currently taking Celexa 20 mg daily.  Patient was noted to be walking with help of a walker for ambulation.  Patient reported that he has been in a lot of pain especially in his back mostly on his right side.  He stated that sometimes the pain is so aggravating and excruciating that he feels like cutting half of his body and getting rid of it.  He stated that he started having problems a few years ago and they have gradually escalated.  He stated that he has tried taking several different medications but nothing is really helped much.  He stated that he was scheduled to undergo surgical procedure for the same but due to his blood pressure being uncontrollably high the procedure was canceled.  He stated that he is under the care of an orthopedic surgeon, as per EMR he has been seeing Dr. Kevan Ny at [facility].  He informed that he supposed to see him again next week and he thinks that the orthopedic surgeon will probably recommend surgery however patient is having second thoughts about the procedure.  He stated that he wants to see if he can  do better with medication management instead of a surgical procedure.  Patient reported that he was chronic pain he has poor quality of life.  He lives with his girlfriend and his daughter, son-in-law and their 22-year-old son.  He stated that his girlfriend has some mental health issues and does not really help him much.  He stated that his relationship with his daughter and son-in-law is okay but not so great.  He does have a caregiver who comes in 5 days a week and helps him take a bath and helps him with meal preparation.  Regarding his mood, he stated that he feels helpless at times and has low energy levels.  He does not feel like doing anything.  He just stays at home and the only reason he gets out of the house is for his appointments.  He reported poor sleep due to poorly controlled pain at night.  He reported poor appetite and poor concentration. When asked regarding suicidal ideations, he stated that he has never thought of hurting himself however when the pain becomes very excruciating he wonders if he will be better off dead.  He does not have any history of prior suicide attempts. He denied excessive consumption of alcohol or use of any illicit substances.  He denied any symptom suggestive of mania or psychosis or PTSD.  As per EMR, patient had noted some improvement in his mood and anxiety symptoms after being started on Celexa  in the past.  Writer asked if he would agree to go up on the dose for optimal effect, patient agreed with this plan. Writer advised the patient to have a discussion with his orthopedic surgeon regarding all the options he has so that he can get optimal control of his pain.  Patient shared his chart of blood pressure findings from home with the writer.  He was noted to have high blood pressure on January 22, his blood pressure seems to be fairly well controlled over the last 3 days.  Past Psychiatric History: Depression, anxiety  Previous Psychotropic  Medications: Yes  - celexa  Substance Abuse History in the last 12 months:  No.  Consequences of Substance Abuse: NA  Past Medical History:  Past Medical History:  Diagnosis Date  . Anemia   . Atrial fibrillation (HCC)   . Hypertension   . Low back pain     Past Surgical History:  Procedure Laterality Date  . CARDIOVERSION N/A 05/10/2019   Procedure: CARDIOVERSION;  Surgeon: Lewayne Bunting, MD;  Location: [facility] ENDOSCOPY;  Service: Cardiovascular;  Laterality: N/A;  . COLONOSCOPY  07/12/2013  . LEFT HEART CATH AND CORONARY ANGIOGRAPHY N/A 08/30/2019   Procedure: LEFT HEART CATH AND CORONARY ANGIOGRAPHY;  Surgeon: Corky Crafts, MD;  Location: [facility] INVASIVE CV LAB;  Service: Cardiovascular;  Laterality: N/A;  . TEE WITHOUT CARDIOVERSION N/A 05/10/2019   Procedure: TRANSESOPHAGEAL ECHOCARDIOGRAM (TEE);  Surgeon: Lewayne Bunting, MD;  Location: [facility] ENDOSCOPY;  Service: Cardiovascular;  Laterality: N/A;    Family Psychiatric History: Siblings have some mental health condition, pt now aware of the details.  Family History:  Family History  Problem Relation Age of Onset  . Diabetes Mother   . Diabetes Sister   . Mental illness Sister   . Mental illness Brother     Social History:   Social History   Socioeconomic History  . Marital status: Single    Spouse name: Not on file  . Number of children: Not on file  . Years of education: Not on file  . Highest education level: Not on file  Occupational History  . Not on file  Tobacco Use  . Smoking status: Former Smoker    Quit date: 12/21/2018    Years since quitting: 1.3  . Smokeless tobacco: Never Used  Vaping Use  . Vaping Use: Never used  Substance and Sexual Activity  . Alcohol use: Not Currently  . Drug use: Never  . Sexual activity: Not on file  Other Topics Concern  . Not on file  Social History Narrative  . Not on file   Social Determinants of Health   Financial Resource Strain: Not on file  Food  Insecurity: No Food Insecurity  . Worried About Programme researcher, broadcasting/film/video in the Last Year: Never true  . Ran Out of Food in the Last Year: Never true  Transportation Needs: Unmet Transportation Needs  . Lack of Transportation (Medical): Yes  . Lack of Transportation (Non-Medical): Yes  Physical Activity: Not on file  Stress: Not on file  Social Connections: Not on file    Additional Social History: Lives with his girlfriend, daughter, son-in-law and 65-year-old grandson.  Currently unemployed and is dealing with financial constraints.  Allergies:   Allergies  Allergen Reactions  . Entresto [Sacubitril-Valsartan]     Abnormal kidney functions     Metabolic Disorder Labs: No results found for: HGBA1C, MPG No results found for: PROLACTIN Lab Results  Component Value Date  CHOL 94 (L) 08/15/2019   TRIG 93 08/15/2019   HDL 41 08/15/2019   CHOLHDL 2.3 08/15/2019   VLDL 17 05/07/2019   LDLCALC 35 08/15/2019   LDLCALC 88 05/07/2019   Lab Results  Component Value Date   TSH 1.496 09/20/2019    Therapeutic Level Labs: No results found for: LITHIUM No results found for: CBMZ No results found for: VALPROATE  Current Medications: Current Outpatient Medications  Medication Sig Dispense Refill  . citalopram (CELEXA) 40 MG tablet Take 1 tablet (40 mg total) by mouth daily. 30 tablet 1  . acetaminophen (TYLENOL) 500 MG tablet Take 500 mg by mouth daily as needed for mild pain.    Marland Kitchen amiodarone (PACERONE) 200 MG tablet TAKE 1 TABLET BY MOUTH EVERY DAY 90 tablet 1  . apixaban (ELIQUIS) 5 MG TABS tablet TAKE 1 TABLET (5 MG TOTAL) BY MOUTH 2 (TWO) TIMES DAILY. 60 tablet 5  . atorvastatin (LIPITOR) 40 MG tablet Take 1 tablet (40 mg total) by mouth daily at 6 PM. 90 tablet 3  . carvedilol (COREG) 25 MG tablet Take 1 tablet (25 mg total) by mouth 2 (two) times daily. 180 tablet 3  . gabapentin (NEURONTIN) 300 MG capsule Take 3 capsules (900 mg total) by mouth at bedtime. 90 capsule 11  .  hydrALAZINE (APRESOLINE) 50 MG tablet Take 1/2 tablet each morning and 1 tablet each evening. 270 tablet 3  . predniSONE (STERAPRED UNI-PAK 21 TAB) 5 MG (21) TBPK tablet Take 6 tablets first day , then one less tablet daily, 5,4,3,2,1. Take with food (Patient not taking: Reported on 03/31/2020) 21 tablet 0   No current facility-administered medications for this visit.    Musculoskeletal: Strength & Muscle Tone: within normal limits Gait & Station: unsteady, uses a walker for ambulation Patient leans: Front  Psychiatric Specialty Exam: Review of Systems  There were no vitals taken for this visit.There is no height or weight on file to calculate BMI.  General Appearance: Fairly Groomed  Eye Contact:  Fair  Speech:  Clear and Coherent and Slow  Volume:  Decreased  Mood:  Depressed  Affect:  Congruent  Thought Process:  Goal Directed and Descriptions of Associations: Intact  Orientation:  Full (Time, Place, and Person)  Thought Content:  Logical  Suicidal Thoughts:  No  Homicidal Thoughts:  No  Memory:  Immediate;   Good Recent;   Good Remote;   Good  Judgement:  Fair  Insight:  Fair  Psychomotor Activity:  Decreased  Concentration:  Concentration: Good and Attention Span: Good  Recall:  Good  Fund of Knowledge:Good  Language: Good  Akathisia:  Negative  Handed:  Left  AIMS (if indicated):  0  Assets:  Communication Skills Desire for Improvement Financial Resources/Insurance Housing  ADL's:  Intact  Cognition: WNL  Sleep:  Poor   Screenings: PHQ2-9   Flowsheet Row Office Visit from 10/08/2019 in [facility] Office Visit from 09/10/2019 in [facility] Office Visit from 06/13/2019 in [facility] Office Visit from 05/16/2019 in [facility]  PHQ-2 Total Score 1 0 0 1      Assessment and Plan: Patient was noted to be depressed and in mild discomfort due to chronic back pain secondary to spinal  stenosis with compression of right side with C7 radiculopathy.  He was started on Celexa with partial improvement by his PCP few months ago, he is agreeable to going up on the  dose for optimal effects.  1. MDD (major depressive disorder), recurrent episode, moderate (HCC)  - Increase citalopram (CELEXA) 40 MG tablet; Take 1 tablet (40 mg total) by mouth daily.  Dispense: 30 tablet; Refill: 1  F/up in 2 months.  Zena Amos, MD 1/25/20221:31 PM

## 2020-04-22 ENCOUNTER — Encounter: Payer: Self-pay | Admitting: Orthopaedic Surgery

## 2020-04-22 ENCOUNTER — Other Ambulatory Visit: Payer: Self-pay

## 2020-04-22 ENCOUNTER — Telehealth: Payer: Self-pay

## 2020-04-22 ENCOUNTER — Ambulatory Visit (INDEPENDENT_AMBULATORY_CARE_PROVIDER_SITE_OTHER): Payer: Medicaid Other | Admitting: Orthopaedic Surgery

## 2020-04-22 VITALS — BP 145/81 | HR 51 | Ht 67.0 in | Wt 159.0 lb

## 2020-04-22 DIAGNOSIS — M5020 Other cervical disc displacement, unspecified cervical region: Secondary | ICD-10-CM | POA: Diagnosis not present

## 2020-04-22 DIAGNOSIS — M4802 Spinal stenosis, cervical region: Secondary | ICD-10-CM | POA: Diagnosis not present

## 2020-04-22 NOTE — Telephone Encounter (Signed)
   [facility] Pre-operative Risk Assessment     Request for surgical clearance:  1. What type of surgery is being performed? 2 level cervical fusion   2. When is this surgery scheduled? TBD   3. What type of clearance is required (medical clearance vs. Pharmacy clearance to hold med vs. Both)? both  4. Are there any medications that need to be held prior to surgery and how long? Seth Snyder is requesting perioperative instructions for Eliquis   5. Practice name and name of physician performing surgery? [facility] (Dr. Lorin Mercy)   6. What is the office phone number? [phone]   7.   What is the office fax number? [phone] attn: Debbie  8.   Anesthesia type (None, local, MAC, general) ? general   Seth Snyder 04/22/2020, 5:34 PM  _________________________________________________________________   (provider comments below)

## 2020-04-23 NOTE — Progress Notes (Signed)
Office Visit Note   Patient: Seth Snyder           Date of Birth: 1961/01/21           MRN: [id number] Visit Date: 04/22/2020              Requested by: Barbette Merino, NP [address] [place],  [place] [address] PCP: Barbette Merino, NP   Assessment & Plan: Visit Diagnoses:  1. Protrusion of cervical intervertebral disc   2. Spinal stenosis of cervical region     Plan: We reviewed MRI scan with him today through the interpreter.  Cardiac symptoms are improved with improved echo function.  We discussed two-level cervical fusion for his cervical stenosis symptomatic with persistent radicular symptoms right upper extremity with weakness from prominent disc protrusion at C6-7.  Patient has moderate to severe stenosis at C5-6.  If he elects for surgery would be two-level cervical fusion with allograft and plate, overnight stay in the hospital.  Postoperative soft collar for 6 weeks discussed.  Risk surgery discussed.  Patient can call if he like to proceed.  Follow-Up Instructions: No follow-ups on file.   Orders:  No orders of the defined types were placed in this encounter.  No orders of the defined types were placed in this encounter.     Procedures: No procedures performed   Clinical Data: No additional findings.   Subjective: Chief Complaint  Patient presents with  . Neck - Pain  . Lower Back - Pain    HPI 61 year old male returns for follow-up of cervical stenosis.  He is ambulating with a Rollator walker and states he has to use it if he walks more than 8 or 10 steps due to legs being weak and concerned about falling.  He continues to have neck pain and right arm numbness that radiates down the radial side of his hand.  Previous cardiac catheterization 08/30/2019 showed severe left ventricular systolic dysfunction with estimated ejection fraction less than 25%.  No stenosis.Follow-up cardiac echo 03/26/2020 showed ejection fraction 60 to 65%.  Left ventricular  ejection fraction by 3D volume of 61%.  Normal LV function.  Patient brought blood pressure which shows he has some volatility with blood pressure ranging up and down.  No orthostatic hypotension.  His blood pressure medications have been adjusted with some improvement.  Review of Systems 14 point update unchanged from 08/08/2019 other than above.   Objective: Vital Signs: BP (!) 145/81   Pulse (!) 51   Ht 5\' 7"  (1.702 m)   Wt 159 lb (72.1 kg)   BMI 24.90 kg/m   Physical Exam Constitutional:      Appearance: He is well-developed and well-nourished.  HENT:     Head: Normocephalic and atraumatic.  Eyes:     Extraocular Movements: EOM normal.     Pupils: Pupils are equal, round, and reactive to light.  Neck:     Thyroid: No thyromegaly.     Trachea: No tracheal deviation.  Cardiovascular:     Rate and Rhythm: Normal rate.  Pulmonary:     Effort: Pulmonary effort is normal.     Breath sounds: No wheezing.  Abdominal:     General: Bowel sounds are normal.     Palpations: Abdomen is soft.  Skin:    General: Skin is warm and dry.     Capillary Refill: Capillary refill takes less than 2 seconds.  Neurological:     Mental Status: He is  alert and oriented to person, place, and time.  Psychiatric:        Mood and Affect: Mood and affect normal.        Behavior: Behavior normal.        Thought Content: Thought content normal.        Judgment: Judgment normal.     Ortho Exam patient only ambulates with his rolling walker but could make 6-8 steps across the exam room slightly wide-based gait.  Patient has right triceps weakness on resisted testing as well as wrist flexion weakness on the right normal on the left.  Wrist extension is strong and symmetrical.  2+ upper extremity reflexes 2+ lower extremity reflexes no clonus.  Increased pain with cervical compression.  No biceps weakness.  Specialty Comments:  No specialty comments available.  Imaging: CLINICAL DATA:  Spinal stenosis  of lumbosacral region. Rule out herniated nucleus pulposis, spinal stenosis, lumbosacral. Additional history provided: Patient reports right neck/shoulder/arm pain and burning to right ring finger for 1 year.  EXAM: MRI CERVICAL SPINE WITHOUT CONTRAST  TECHNIQUE: Multiplanar, multisequence MR imaging of the cervical spine was performed. No intravenous contrast was administered.  COMPARISON:  Radiographs of the cervical spine 05/07/2019  FINDINGS: Alignment: Reversal of the expected cervical lordosis centered at C3-C4. 1-2 mm grade 1 anterolisthesis at C3-C4, C4-C5 and C5-C6.  Vertebrae: Vertebral body height is maintained. No suspicious osseous lesion. Trace degenerative endplate edema anteriorly at C5-C6.  Cord: No spinal cord signal abnormality.  Posterior Fossa, vertebral arteries, paraspinal tissues: Mild cerebellar atrophy. Partially empty sella turcica. Flow voids preserved within the imaged cervical vertebral arteries. Paraspinal soft tissues within normal limits.  Disc levels:  Mild disc degeneration throughout the cervical spine.  C2-C3: No significant disc herniation or stenosis.  C3-C4: Mild grade 1 retrolisthesis. Shallow disc bulge. Uncinate/facet hypertrophy. No significant spinal canal stenosis. Mild left neural foraminal narrowing.  C4-C5: Mild grade 1 retrolisthesis. Disc uncovering with shallow disc bulge. Uncinate/facet hypertrophy. No significant spinal canal stenosis or neural foraminal narrowing.  C5-C6: Broad-based disc bulge. Uncinate/facet hypertrophy. Moderate spinal canal stenosis. There is contact upon the ventral spinal cord with mild cord flattening. Bilateral neural foraminal narrowing (moderate right, mild left).  C6-C7: Disc bulge. Superimposed broad-based right center to right foraminal disc protrusion (series 6, image 20) (series 2, image 5). Mild uncinate and facet hypertrophy. The disc protrusion partially effaces  the right ventral thecal sac, contacting and mildly flattening the ventral spinal cord. Overall mild central canal stenosis. The disc protrusion likely encroaches upon the exiting right C7 nerve root within the right foraminal entry zone and right neural foramen. Mild left neural foraminal narrowing.  C7-T1: No significant disc herniation or stenosis.  IMPRESSION: Cervical spondylosis as outlined with findings most notably as follows.  At C6-C7, there is a disc bulge. Superimposed broad-based right center to right foraminal disc protrusion. The disc protrusion likely encroaches upon the exiting right C7 nerve root within the right foraminal entry zone and right neural foramen. Correlate for right C7 radiculopathy. The disc protrusion also partially effaces the right ventrolateral thecal sac, contacting and mildly flattening the ventral cord. Overall mild central canal stenosis. Mild left neural foraminal narrowing.  At C5-C6, there is multifactorial moderate spinal canal stenosis. A broad-based disc bulge contacts and mildly flattens the ventral spinal cord. Bilateral neural foraminal narrowing (moderate right, mild left).  No significant canal stenosis at the remaining levels. Mild C3-C4 left neural foraminal narrowing.   Electronically Signed   By: Ronaldo Miyamoto  Golden DO   On: 07/20/2019 09:29    PMFS History: Patient Active Problem List   Diagnosis Date Noted  . MDD (major depressive disorder), recurrent episode, moderate (HCC) 04/15/2020  . Persistent atrial fibrillation (HCC) 09/20/2019  . Secondary hypercoagulable state (HCC) 09/20/2019  . Abnormal nuclear stress test   . Spinal stenosis of cervical region 08/13/2019  . Protrusion of cervical intervertebral disc 08/13/2019  . Shoulder pain   . Chronic combined systolic and diastolic heart failure (HCC)   . Atrial fibrillation with RVR (HCC) 05/07/2019  . Hypertension 05/07/2019  . Hypothyroidism 05/07/2019  .  Hypomagnesemia 05/07/2019  . Elevated troponin 05/07/2019   Past Medical History:  Diagnosis Date  . Anemia   . Atrial fibrillation (HCC)   . Hypertension   . Low back pain     Family History  Problem Relation Age of Onset  . Diabetes Mother   . Diabetes Sister   . Mental illness Sister   . Mental illness Brother     Past Surgical History:  Procedure Laterality Date  . CARDIOVERSION N/A 05/10/2019   Procedure: CARDIOVERSION;  Surgeon: Lewayne Bunting, MD;  Location: [facility] ENDOSCOPY;  Service: Cardiovascular;  Laterality: N/A;  . COLONOSCOPY  07/12/2013  . LEFT HEART CATH AND CORONARY ANGIOGRAPHY N/A 08/30/2019   Procedure: LEFT HEART CATH AND CORONARY ANGIOGRAPHY;  Surgeon: Corky Crafts, MD;  Location: [facility] INVASIVE CV LAB;  Service: Cardiovascular;  Laterality: N/A;  . TEE WITHOUT CARDIOVERSION N/A 05/10/2019   Procedure: TRANSESOPHAGEAL ECHOCARDIOGRAM (TEE);  Surgeon: Lewayne Bunting, MD;  Location: [facility] ENDOSCOPY;  Service: Cardiovascular;  Laterality: N/A;   Social History   Occupational History  . Not on file  Tobacco Use  . Smoking status: Former Smoker    Quit date: 12/21/2018    Years since quitting: 1.3  . Smokeless tobacco: Never Used  Vaping Use  . Vaping Use: Never used  Substance and Sexual Activity  . Alcohol use: Not Currently  . Drug use: Never  . Sexual activity: Not on file

## 2020-04-23 NOTE — Telephone Encounter (Signed)
Pharmacy please comment on holding anticoagulation for the listed procedure and then we will contact the patient for clearance.  Corine Shelter PA-C 04/23/2020 8:50 AM

## 2020-04-24 NOTE — Telephone Encounter (Signed)
   Primary Cardiologist: Chilton Si, MD  Chart reviewed as part of pre-operative protocol coverage. Patient was contacted 04/24/2020 with the assistance of Nepali interpreter ID [phone] in reference to pre-operative risk assessment for pending surgery as outlined below.  Seth Snyder was last seen on 03/26/20 by Dr. Johney Frame.  Since that day, Seth Snyder has done okay from a cardiac standpoint. He has chronic DOE at baseline which is unchanged in recent months. Also with LE weakness which limits his mobility. He has no complaints of chest pain, though he is unable to complete 4 METs. Recent echocardiogram 03/2020 showed EF.60-65%, up from <25% at the time of [facility] 08/2019, at which time he had normal coronary arteries. 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   Given recent work-up, the patient would be at acceptable risk for the planned procedure without further cardiovascular testing.   The patient was advised that if he develops new symptoms prior to surgery to contact our office to arrange for a follow-up visit, and he verbalized understanding.  Per pharmacy recommendations, patient can hold eliquis 3 days prior to his upcoming procedure with plans to restart as soon as he is cleared to do so by his surgeon.   I will route this recommendation to the requesting party via Epic fax function and remove from pre-op pool. Please call with questions.  Beatriz Stallion, PA-C 04/24/2020, 9:56 AM

## 2020-04-24 NOTE — Telephone Encounter (Signed)
Patient with diagnosis of A Fib on Eliquis for anticoagulation.    1. Procedure:  2 level cervical fusion   Date of procedure: TBD   CHA2DS2-VASc Score = 2  This indicates a 2.2% annual risk of stroke. The patient's score is based upon: CHF History: Yes HTN History: Yes Diabetes History: No Stroke History: No Vascular Disease History: No Age Score: 0 Gender Score: 0   CrCl 72 mL./min Platelet count 136K   Per office protocol, patient can hold Eliquis for 3 days prior to procedure.    Patient will not need bridging with Lovenox (enoxaparin) around procedure.  If not bridging, patient should restart Eliquis on the evening of procedure or day after, at discretion of procedure MD  For orthopedic procedures please be sure to resume therapeutic (not prophylactic) dosing.

## 2020-05-08 ENCOUNTER — Other Ambulatory Visit: Payer: Self-pay

## 2020-05-08 ENCOUNTER — Ambulatory Visit: Payer: Medicaid Other

## 2020-05-08 ENCOUNTER — Ambulatory Visit (INDEPENDENT_AMBULATORY_CARE_PROVIDER_SITE_OTHER): Payer: Medicaid Other | Admitting: Pharmacist Clinician (PhC)/ Clinical Pharmacy Specialist

## 2020-05-08 DIAGNOSIS — I10 Essential (primary) hypertension: Secondary | ICD-10-CM | POA: Diagnosis not present

## 2020-05-08 MED ORDER — HYDRALAZINE HCL 50 MG PO TABS: ORAL_TABLET | ORAL | 3 refills | Status: DC

## 2020-05-08 NOTE — Assessment & Plan Note (Signed)
Patient with essential hypertension, doing better in mid-day and evening, but still having (assumed) high morning BP readings.   He was taught correct BP technique today.  We will have him increase his evening hydralazine dose from 50 to 75 mg, and leave the morning dose at 25 mg.  He should continue with home monitoring and contact the office should his morning readings get too low.  He is scheduled to see Dr. Duke Salvia in 6 weeks.

## 2020-05-08 NOTE — Progress Notes (Signed)
05/08/2020 Eri Hideaway Salvas 06-30-1960 [id number]   HPI:  Seth Snyder is a 60 y.o. male patient of Dr Duke Salvia, with a PMH below who presents today for hypertension clinic evaluation.  She saw him last in December at which time his blood pressure was elevated to 190/100.  She switched metoprolol to carvedilol and added hydralazine 50 mg twice daily.  He was in the ED on Dec 22 after a syncopal episode while at a pain management appointment (he did not eat that morning).    He then saw Dr. Johney Frame the first week of January at which time pressure was down to 140/78.  He is here today with an interpreter from [facility] services [facility]).  He states that he has not had any issues with the changes in medications from Dr. Duke Salvia.  He has been having ongoing problems with rash and itchy skin, states it continues to be bothersome.  He doesn't appear to have any signs of rash on his legs, however does have several scabs from areas of scratching.  He also notes that he continues to have some SOB off and on most days.  States will start with a strange sensation near his stomach then he feels tightness in his chest for about an hour.  It will abate then repeat a few hours later.  Takes twice daily medications at 8 am and 6 pm, takes once daily meds at noon.  No issues with compliance.  He had previously been on Entresto and spironolactone for HF, however his SCr increase to 1.59. Meds were discontinued and SCr now back down to 1.15.    At his last visit we cut hydralazine back to 25 mg each morning, continuing with 50 mg in the evenings, to help avoid BP drops of 50+ points around noon that have occurred several times.   Today he notes that this is less frequent.  However all his home readings, especially in the mornings, are quite high.   We had him show [place] his technique today in the office and he was using the cuff upside down.  Because of this, not sure how accurate home readings actually are.     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   Home readings vary greatly   AM:  16 readings average 166/99 (range 89-205/66-110)  PM:  13 readings average 121/75 (range 75-176/53-94)     Intolerances: sacubitril/valsartan - increase in SCr, spironolactone - increase in SCr  Labs: 12/21  Na 135, K 4.3, Glu 93, BUN 13, SCr 1.15 GFR > 60  Wt Readings from Last 3 Encounters:  05/08/20 160 lb (72.6 kg)  04/22/20 159 lb (72.1 kg)  03/31/20 159 lb 3.2 oz (72.2 kg)   BP Readings from Last 3 Encounters:  05/08/20 124/78  04/22/20 (!) 145/81  04/11/20 (!) 146/82   Pulse Readings from Last 3 Encounters:  05/08/20 (!) 57  04/22/20 (!) 51  04/11/20 (!) 56    Current Outpatient Medications  Medication Sig Dispense Refill  . acetaminophen (TYLENOL) 500 MG tablet Take 500 mg by mouth daily as needed for mild pain.    Marland Kitchen amiodarone (PACERONE) 200 MG tablet TAKE 1 TABLET BY MOUTH EVERY DAY 90 tablet 1  . apixaban (ELIQUIS) 5 MG TABS tablet TAKE 1 TABLET (5 MG TOTAL) BY MOUTH 2 (TWO) TIMES DAILY. 60 tablet 5  . atorvastatin (LIPITOR) 40 MG tablet Take 1 tablet (40 mg total) by mouth daily at 6 PM. 90 tablet 3  . carvedilol (COREG) 25 MG tablet Take 1 tablet (25 mg total) by mouth 2 (two) times daily. 180 tablet 3  . citalopram (CELEXA) 40 MG tablet Take 1 tablet (40 mg total) by mouth daily. 30 tablet 1  . gabapentin (NEURONTIN) 300 MG capsule Take 3 capsules (900 mg total) by mouth at  bedtime. 90 capsule 11  . hydrALAZINE (APRESOLINE) 50 MG tablet Take 1/2 tablet each morning and 1 & 1/2 tablets each evening. 180 tablet 3  . metoprolol succinate (TOPROL-XL) 100 MG 24 hr tablet Take 100 mg by mouth 2 (two) times daily.     No current facility-administered medications for this visit.    Allergies  Allergen Reactions  . Entresto [Sacubitril-Valsartan]     Abnormal kidney functions     Past Medical History:  Diagnosis Date  . Anemia   . Atrial fibrillation (HCC)   . Hypertension   . Low back pain     Blood pressure 124/78, pulse (!) 57, resp. rate 15, height 5\' 7"  (1.702 m), weight 160 lb (72.6 kg), SpO2 94 %.  Hypertension Patient with essential hypertension, doing better in mid-day and evening, but still having (assumed) high morning BP readings.   He was taught correct BP technique today.  We will have him increase his evening hydralazine dose from 50 to 75 mg, and leave the morning dose at 25 mg.  He should continue with home monitoring and contact the office should his morning readings get too low.  He is scheduled to see Dr. in 6 weeks.     Duke Salvia PharmD CPP [facility] [address] [place], [place] [phone]

## 2020-05-08 NOTE — Patient Instructions (Signed)
Return for a a follow up appointment with Dr. Duke Salvia on April 5 at 2:20  Check your blood pressure at home daily and keep record of the readings.  Take your BP meds as follows:  Increase night dose of hydralazine to 1.5 tablets (75 mg) each night.  If your morning BP goes too low (<100 top number), then go back to just taking 1 tablet at night.  You can call us to let us know if this happens.    Shacola Schussler/Raquel at [phone]  Bring all of your meds, your BP cuff and your record of home blood pressures to your next appointment.  Exercise as you're able, try to walk approximately 30 minutes per day.  Keep salt intake to a minimum, especially watch canned and prepared boxed foods.  Eat more fresh fruits and vegetables and fewer canned items.  Avoid eating in fast food restaurants.    HOW TO TAKE YOUR BLOOD PRESSURE: . Rest 5 minutes before taking your blood pressure. .  Don't smoke or drink caffeinated beverages for at least 30 minutes before. . Take your blood pressure before (not after) you eat. . Sit comfortably with your back supported and both feet on the floor (don't cross your legs). . Elevate your arm to heart level on a table or a desk. . Use the proper sized cuff. It should fit smoothly and snugly around your bare upper arm. There should be enough room to slip a fingertip under the cuff. The bottom edge of the cuff should be 1 inch above the crease of the elbow. . Ideally, take 3 measurements at one sitting and record the average.

## 2020-05-09 ENCOUNTER — Other Ambulatory Visit (HOSPITAL_COMMUNITY): Payer: Self-pay | Admitting: Psychiatry

## 2020-05-09 DIAGNOSIS — F331 Major depressive disorder, recurrent, moderate: Secondary | ICD-10-CM

## 2020-05-14 ENCOUNTER — Emergency Department (HOSPITAL_COMMUNITY)
Admission: EM | Admit: 2020-05-14 | Discharge: 2020-05-14 | Disposition: A | Discharge status: Home / Self Care | Payer: Medicaid Other | Attending: Emergency Medicine | Admitting: Emergency Medicine

## 2020-05-14 ENCOUNTER — Telehealth: Payer: Self-pay | Admitting: *Deleted

## 2020-05-14 ENCOUNTER — Ambulatory Visit (INDEPENDENT_AMBULATORY_CARE_PROVIDER_SITE_OTHER): Payer: Medicaid Other | Admitting: Nurse Practitioner

## 2020-05-14 ENCOUNTER — Other Ambulatory Visit: Payer: Self-pay

## 2020-05-14 ENCOUNTER — Emergency Department (HOSPITAL_COMMUNITY): Payer: Medicaid Other

## 2020-05-14 VITALS — BP 61/37 | HR 52 | Temp 97.8°F | Ht 67.0 in | Wt 153.0 lb

## 2020-05-14 DIAGNOSIS — R001 Bradycardia, unspecified: Secondary | ICD-10-CM

## 2020-05-14 DIAGNOSIS — I952 Hypotension due to drugs: Secondary | ICD-10-CM

## 2020-05-14 DIAGNOSIS — E039 Hypothyroidism, unspecified: Secondary | ICD-10-CM | POA: Diagnosis not present

## 2020-05-14 DIAGNOSIS — I951 Orthostatic hypotension: Secondary | ICD-10-CM | POA: Diagnosis not present

## 2020-05-14 DIAGNOSIS — I110 Hypertensive heart disease with heart failure: Secondary | ICD-10-CM | POA: Insufficient documentation

## 2020-05-14 DIAGNOSIS — Z79899 Other long term (current) drug therapy: Secondary | ICD-10-CM | POA: Diagnosis not present

## 2020-05-14 DIAGNOSIS — Z20822 Contact with and (suspected) exposure to covid-19: Secondary | ICD-10-CM | POA: Insufficient documentation

## 2020-05-14 DIAGNOSIS — Z7901 Long term (current) use of anticoagulants: Secondary | ICD-10-CM | POA: Insufficient documentation

## 2020-05-14 DIAGNOSIS — I5042 Chronic combined systolic (congestive) and diastolic (congestive) heart failure: Secondary | ICD-10-CM | POA: Diagnosis not present

## 2020-05-14 DIAGNOSIS — Z87891 Personal history of nicotine dependence: Secondary | ICD-10-CM | POA: Diagnosis not present

## 2020-05-14 DIAGNOSIS — I959 Hypotension, unspecified: Secondary | ICD-10-CM | POA: Diagnosis present

## 2020-05-14 LAB — COMPREHENSIVE METABOLIC PANEL
ALT: 25 U/L (ref 0–44)
AST: 33 U/L (ref 15–41)
Albumin: 4.3 g/dL (ref 3.5–5.0)
Alkaline Phosphatase: 54 U/L (ref 38–126)
Anion gap: 11 (ref 5–15)
BUN: 14 mg/dL (ref 6–20)
CO2: 21 mmol/L — ABNORMAL LOW (ref 22–32)
Calcium: 8.6 mg/dL — ABNORMAL LOW (ref 8.9–10.3)
Chloride: 110 mmol/L (ref 98–111)
Creatinine, Ser: 1.81 mg/dL — ABNORMAL HIGH (ref 0.61–1.24)
GFR, Estimated: 43 mL/min — ABNORMAL LOW (ref >60)
Glucose, Bld: 94 mg/dL (ref 70–99)
Potassium: 4.6 mmol/L (ref 3.5–5.1)
Sodium: 142 mmol/L (ref 135–145)
Total Bilirubin: 0.9 mg/dL (ref 0.3–1.2)
Total Protein: 7.3 g/dL (ref 6.5–8.1)

## 2020-05-14 LAB — CBC WITH DIFFERENTIAL/PLATELET
Abs Immature Granulocytes: 0.02 10*3/uL (ref 0.00–0.07)
Basophils Absolute: 0.1 10*3/uL (ref 0.0–0.1)
Basophils Relative: 1 %
Eosinophils Absolute: 0.3 10*3/uL (ref 0.0–0.5)
Eosinophils Relative: 4 %
HCT: 36 % — ABNORMAL LOW (ref 39.0–52.0)
Hemoglobin: 11.5 g/dL — ABNORMAL LOW (ref 13.0–17.0)
Immature Granulocytes: 0 %
Lymphocytes Relative: 26 %
Lymphs Abs: 1.9 10*3/uL (ref 0.7–4.0)
MCH: 28.4 pg (ref 26.0–34.0)
MCHC: 31.9 g/dL (ref 30.0–36.0)
MCV: 88.9 fL (ref 80.0–100.0)
Monocytes Absolute: 0.7 10*3/uL (ref 0.1–1.0)
Monocytes Relative: 10 %
Neutro Abs: 4.4 10*3/uL (ref 1.7–7.7)
Neutrophils Relative %: 59 %
Platelets: 205 10*3/uL (ref 150–400)
RBC: 4.05 MIL/uL — ABNORMAL LOW (ref 4.22–5.81)
RDW: 13.9 % (ref 11.5–15.5)
WBC: 7.4 10*3/uL (ref 4.0–10.5)
nRBC: 0 % (ref 0.0–0.2)

## 2020-05-14 LAB — URINALYSIS, ROUTINE W REFLEX MICROSCOPIC
Bacteria, UA: NONE SEEN
Bilirubin Urine: NEGATIVE
Glucose, UA: NEGATIVE mg/dL
Hgb urine dipstick: NEGATIVE
Ketones, ur: NEGATIVE mg/dL
Leukocytes,Ua: NEGATIVE
Nitrite: NEGATIVE
Protein, ur: 30 mg/dL — AB
Specific Gravity, Urine: 1.006 (ref 1.005–1.030)
pH: 7 (ref 5.0–8.0)

## 2020-05-14 LAB — LACTIC ACID, PLASMA
Lactic Acid, Venous: 1.7 mmol/L (ref 0.5–1.9)
Lactic Acid, Venous: 1.3 mmol/L (ref 0.5–1.9)

## 2020-05-14 LAB — RESP PANEL BY RT-PCR (FLU A&B, COVID) ARPGX2
Influenza A by PCR: NEGATIVE
Influenza B by PCR: NEGATIVE
SARS Coronavirus 2 by RT PCR: NEGATIVE

## 2020-05-14 LAB — TROPONIN I (HIGH SENSITIVITY)
Troponin I (High Sensitivity): 5 ng/L (ref <18)
Troponin I (High Sensitivity): 5 ng/L (ref <18)

## 2020-05-14 LAB — D-DIMER, QUANTITATIVE: D-Dimer, Quant: <0.27 ug/mL-FEU (ref 0.00–0.50)

## 2020-05-14 MED ORDER — SODIUM CHLORIDE 0.9 % IV BOLUS
1000.0000 mL | Freq: Once | INTRAVENOUS | Status: AC
  Administered 2020-05-14: 1000 mL via INTRAVENOUS

## 2020-05-14 NOTE — ED Provider Notes (Signed)
[facility]-EMERGENCY DEPT Provider Note   CSN: [id number] Arrival date & time: 05/14/20  1533     History Chief Complaint  Patient presents with  . Hypotension    Seth Snyder is a 60 y.o. male.  The history is provided by the patient and medical records. The history is limited by a language barrier. A language interpreter was used.     60 year old Nepali male significant history of A. fib with RVR CHF, hypertension, anemia, brought here via EMS for evaluation of low blood pressure.  Patient was seen at the patient care center today when he was found to have a blood pressure of 61/37.  He also report feeling weak.  He was promptly sent to the ED for evaluation.  Per prior notes, patient was seen at the [facility] office on 2/17 for managements of high blood pressure.  Patient had several of his medication switch which include switching metoprolol to carvedilol and add hydralazine 50 mg daily.  Additional history obtained through [place] language interpreter.  It was difficult to obtain history from patient.  When asked what brought patient in today he reported it was due to his low blood pressure.  When asked if he has any acute complaint, patient states he has pain to his neck and back.  It appears that his pain has been an ongoing issues for many months.  He is supposed to have surgery by Dr. Ophelia Charter for protrusions of cervical disc a at the level of C6-7 and spinal stenosis.  He report his pain has been waxing and waning.  He does not complain of any fever.  Patient denies having runny nose sneezing or coughing he does not complain of chest pain or trouble breathing.  No report of any dysuria or having abdominal pain.  No bowel bladder changes.  No new numbness or new weakness.  He did report he feeling weaker and more tired for the past few days.  He has been vaccinated for Covid 19.  Past Medical History:  Diagnosis Date  . Anemia   . Atrial fibrillation (HCC)    . Hypertension   . Low back pain     Patient Active Problem List   Diagnosis Date Noted  . MDD (major depressive disorder), recurrent episode, moderate (HCC) 04/15/2020  . Persistent atrial fibrillation (HCC) 09/20/2019  . Secondary hypercoagulable state (HCC) 09/20/2019  . Abnormal nuclear stress test   . Spinal stenosis of cervical region 08/13/2019  . Protrusion of cervical intervertebral disc 08/13/2019  . Shoulder pain   . Chronic combined systolic and diastolic heart failure (HCC)   . Atrial fibrillation with RVR (HCC) 05/07/2019  . Hypertension 05/07/2019  . Hypothyroidism 05/07/2019  . Hypomagnesemia 05/07/2019  . Elevated troponin 05/07/2019    Past Surgical History:  Procedure Laterality Date  . CARDIOVERSION N/A 05/10/2019   Procedure: CARDIOVERSION;  Surgeon: Lewayne Bunting, MD;  Location: [facility] ENDOSCOPY;  Service: Cardiovascular;  Laterality: N/A;  . COLONOSCOPY  07/12/2013  . LEFT HEART CATH AND CORONARY ANGIOGRAPHY N/A 08/30/2019   Procedure: LEFT HEART CATH AND CORONARY ANGIOGRAPHY;  Surgeon: Corky Crafts, MD;  Location: [facility] INVASIVE CV LAB;  Service: Cardiovascular;  Laterality: N/A;  . TEE WITHOUT CARDIOVERSION N/A 05/10/2019   Procedure: TRANSESOPHAGEAL ECHOCARDIOGRAM (TEE);  Surgeon: Lewayne Bunting, MD;  Location: [facility] ENDOSCOPY;  Service: Cardiovascular;  Laterality: N/A;       Family History  Problem Relation Age of Onset  . Diabetes Mother   .  Diabetes Sister   . Mental illness Sister   . Mental illness Brother     Social History   Tobacco Use  . Smoking status: Former Smoker    Quit date: 12/21/2018    Years since quitting: 1.3  . Smokeless tobacco: Never Used  Vaping Use  . Vaping Use: Never used  Substance Use Topics  . Alcohol use: Not Currently  . Drug use: Never    Home Medications Prior to Admission medications   Medication Sig Start Date End Date Taking? Authorizing Provider  acetaminophen (TYLENOL) 500 MG tablet Take  500 mg by mouth daily as needed for mild pain.    [provider]  amiodarone (PACERONE) 200 MG tablet TAKE 1 TABLET BY MOUTH EVERY DAY 03/31/20   Chilton Si, MD  apixaban (ELIQUIS) 5 MG TABS tablet TAKE 1 TABLET (5 MG TOTAL) BY MOUTH 2 (TWO) TIMES DAILY. 12/21/19   Chilton Si, MD  atorvastatin (LIPITOR) 40 MG tablet Take 1 tablet (40 mg total) by mouth daily at 6 PM. 08/27/19   Chilton Si, MD  carvedilol (COREG) 25 MG tablet Take 1 tablet (25 mg total) by mouth 2 (two) times daily. 03/11/20 06/09/20  Chilton Si, MD  citalopram (CELEXA) 40 MG tablet TAKE 1 TABLET BY MOUTH EVERY DAY 05/13/20   Zena Amos, MD  gabapentin (NEURONTIN) 300 MG capsule Take 3 capsules (900 mg total) by mouth at bedtime. 03/31/20 03/31/21  Barbette Merino, NP  hydrALAZINE (APRESOLINE) 50 MG tablet Take 1/2 tablet each morning and 1 & 1/2 tablets each evening. 05/08/20   Chilton Si, MD    Allergies    Sherryll Burger [sacubitril-valsartan]  Review of Systems   Review of Systems  All other systems reviewed and are negative.   Physical Exam Updated Vital Signs BP (!) 187/94   Pulse 61   Temp 98 F (36.7 C) (Rectal)   Resp 20   SpO2 94%   Physical Exam Vitals and nursing note reviewed.  Constitutional:      General: He is not in acute distress.    Appearance: He is well-developed and well-nourished.     Comments: Patient in no acute distress, moving very slowly.  HENT:     Head: Atraumatic.     Mouth/Throat:     Mouth: Mucous membranes are moist.     Comments: Poor dentition Eyes:     Extraocular Movements: Extraocular movements intact.     Conjunctiva/sclera: Conjunctivae normal.     Pupils: Pupils are equal, round, and reactive to light.  Neck:     Comments: No nuchal rigidity. Cardiovascular:     Rate and Rhythm: Bradycardia present.     Pulses: Normal pulses.     Heart sounds: Normal heart sounds.  Pulmonary:     Effort: Pulmonary effort is normal.     Breath  sounds: Normal breath sounds. No wheezing, rhonchi or rales.  Abdominal:     Palpations: Abdomen is soft.     Tenderness: There is no abdominal tenderness.  Musculoskeletal:     Cervical back: Neck supple. No rigidity.     Comments: No reproducible tenderness on exam.  Although poor effort patient has equal strength throughout all 4 extremities with intact distal pulses.  Skin:    Findings: No rash.  Neurological:     Mental Status: He is alert and oriented to person, place, and time.  Psychiatric:        Mood and Affect: Mood and affect and mood normal.  ED Results / Procedures / Treatments   Labs (all labs ordered are listed, but only abnormal results are displayed) Labs Reviewed  COMPREHENSIVE METABOLIC PANEL - Abnormal; Notable for the following components:      Result Value   CO2 21 (*)    Creatinine, Rik 1.81 (*)    Calcium 8.6 (*)    GFR, Estimated 43 (*)    All other components within normal limits  CBC WITH DIFFERENTIAL/PLATELET - Abnormal; Notable for the following components:   RBC 4.05 (*)    Hemoglobin 11.5 (*)    HCT 36.0 (*)    All other components within normal limits  URINALYSIS, ROUTINE W REFLEX MICROSCOPIC - Abnormal; Notable for the following components:   Color, Urine STRAW (*)    Protein, ur 30 (*)    All other components within normal limits  RESP PANEL BY RT-PCR (FLU A&B, COVID) ARPGX2  D-DIMER, QUANTITATIVE  LACTIC ACID, PLASMA  LACTIC ACID, PLASMA  TROPONIN I (HIGH SENSITIVITY)  TROPONIN I (HIGH SENSITIVITY)    EKG EKG Interpretation  Date/Time:  Wednesday May 14 2020 16:13:19 EST Ventricular Rate:  59 PR Interval:    QRS Duration: 117 QT Interval:  505 QTC Calculation: 501 R Axis:   79 Text Interpretation: Sinus rhythm Incomplete right bundle branch block Left ventricular hypertrophy Prolonged QT interval No significant change since last tracing Confirmed by Alvira Monday [phone]) on 05/14/2020 5:55:21 PM   Radiology DG  Chest Portable 1 View  Result Date: 05/14/2020 CLINICAL DATA:  Hypotension, neck pain, fatigue, nausea EXAM: PORTABLE CHEST 1 VIEW COMPARISON:  05/07/2019 FINDINGS: Single frontal view of the chest demonstrates an unremarkable cardiac silhouette. No acute airspace disease, effusion, or pneumothorax. Likely scarring or atelectasis within the left lower lobe unchanged. No acute bony abnormalities. IMPRESSION: 1. No acute intrathoracic process. Electronically Signed   By: Sharlet Salina M.D.   On: 05/14/2020 16:42    Procedures Procedures   Medications Ordered in ED Medications  sodium chloride 0.9 % bolus 1,000 mL (1,000 mLs Intravenous New Bag/Given 05/14/20 1620)    ED Course  I have reviewed the triage vital signs and the nursing notes.  Pertinent labs & imaging results that were available during my care of the patient were reviewed by me and considered in my medical decision making (see chart for details).    MDM Rules/Calculators/A&P                          BP (!) 187/94   Pulse 61   Temp 98 F (36.7 C) (Rectal)   Resp 20   SpO2 94%   Final Clinical Impression(s) / ED Diagnoses Final diagnoses:  Orthostatic hypotension    Rx / DC Orders ED Discharge Orders    None     4:14 PM Patient sent here due to having low blood pressure according to his PCP on a regular checkup today.  He was recently seen at the cardiology clinic roughly a week ago and has his blood pressure medication readjust due to high blood pressure.  Perhaps his decreased blood pressure may be secondary to recent medication changes.  He does complain of pain to his neck and back but pain has been ongoing issue for many months.  He was last seen by his orthopedist Dr. Kevan Ny several weeks ago for his neck pain related to moderate to severe stenosis at C5, C6.  His current pain is likely related to that.  His  presentation is not consistent with aortic dissection.  He does not have any Covid symptoms at this time.   His or temp is normal, will check rectal temp.  Screening exam initiated.  Care discussed with Dr. Dalene Seltzer.  Since there was such a discrepancy in patient's initial blood pressure of 61/37 and a repeat blood pressure of 144/96, we did perform a manual blood pressure checked on both arms to ensure there is no significant discrepancy to suggest dissection.  Fortunately repeat blood pressure is similar at 150-156 systolic.  Patient has a normal rectal temperature.  At this time he does not have any symptoms to suggest sepsis or acute infection.  7:38 PM Patient does have an AKI with a creatinine of 1.81.  He does have positive orthostatic changes and was symptomatic when he stood up.  HR remains the same, likely due to being on BP meds.  IVF given.  Will give oral PO.  Otherwise, no other concerning findings noted on today's exam. No sources of infection.   8:20 PM I encourage patient to follow-up with his cardiologist for medication readjustment.  Otherwise at this time he is stable for discharge.    Fayrene Helper, PA-C 05/14/20 2022    Alvira Monday, MD 05/15/20 [phone]

## 2020-05-14 NOTE — Progress Notes (Signed)
[facility] [address] [facility] [address] [place], [place]  [address] Phone:  [phone]   Fax:  [phone]   Established Patient Office Visit  Subjective:  Patient ID: Seth Snyder, male    DOB: 11-01-1960  Age: 60 y.o. MRN: [id number]  CC:  Chief Complaint  Patient presents with  . Follow-up    Follow up , some numbness ,pt has his b/p reading with him     HPI Seth Snyder [facility] presents for follow up. He  has a past medical history of Anemia, Atrial fibrillation (HCC), Hypertension, and Low back pain.   He is in today for medical clearance related to cervical surgery. The patient is noted to have a MAP's <65/ SBP's <90. With the current information available to me, I don't think the patient is in septic shock. The MAP's <65/ SBP's <90, is related to a medication hydralzine and metoprolol .   Past Medical History:  Diagnosis Date  . Anemia   . Atrial fibrillation (HCC)   . Hypertension   . Low back pain     Past Surgical History:  Procedure Laterality Date  . CARDIOVERSION N/A 05/10/2019   Procedure: CARDIOVERSION;  Surgeon: Lewayne Bunting, MD;  Location: [facility] ENDOSCOPY;  Service: Cardiovascular;  Laterality: N/A;  . COLONOSCOPY  07/12/2013  . LEFT HEART CATH AND CORONARY ANGIOGRAPHY N/A 08/30/2019   Procedure: LEFT HEART CATH AND CORONARY ANGIOGRAPHY;  Surgeon: Corky Crafts, MD;  Location: [facility] INVASIVE CV LAB;  Service: Cardiovascular;  Laterality: N/A;  . TEE WITHOUT CARDIOVERSION N/A 05/10/2019   Procedure: TRANSESOPHAGEAL ECHOCARDIOGRAM (TEE);  Surgeon: Lewayne Bunting, MD;  Location: [facility] ENDOSCOPY;  Service: Cardiovascular;  Laterality: N/A;    Family History  Problem Relation Age of Onset  . Diabetes Mother   . Diabetes Sister   . Mental illness Sister   . Mental illness Brother     Social History   Socioeconomic History  . Marital status: Single    Spouse name: Not on file  . Number of children: Not on file  . Years of education: Not  on file  . Highest education level: Not on file  Occupational History  . Not on file  Tobacco Use  . Smoking status: Former Smoker    Quit date: 12/21/2018    Years since quitting: 1.4  . Smokeless tobacco: Never Used  Vaping Use  . Vaping Use: Never used  Substance and Sexual Activity  . Alcohol use: Not Currently  . Drug use: Never  . Sexual activity: Not on file  Other Topics Concern  . Not on file  Social History Narrative  . Not on file   Social Determinants of Health   Financial Resource Strain: Not on file  Food Insecurity: No Food Insecurity  . Worried About Programme researcher, broadcasting/film/video in the Last Year: Never true  . Ran Out of Food in the Last Year: Never true  Transportation Needs: Unmet Transportation Needs  . Lack of Transportation (Medical): Yes  . Lack of Transportation (Non-Medical): Yes  Physical Activity: Not on file  Stress: Not on file  Social Connections: Not on file  Intimate Partner Violence: Not on file    Outpatient Medications Prior to Visit  Medication Sig Dispense Refill  . acetaminophen (TYLENOL) 500 MG tablet Take 1,000 mg by mouth daily as needed for mild pain.    Marland Kitchen amiodarone (PACERONE) 200 MG tablet TAKE 1 TABLET BY MOUTH EVERY DAY (Patient taking differently: Take  200 mg by mouth daily.) 90 tablet 1  . apixaban (ELIQUIS) 5 MG TABS tablet TAKE 1 TABLET (5 MG TOTAL) BY MOUTH 2 (TWO) TIMES DAILY. (Patient taking differently: Take 5 mg by mouth 2 (two) times daily.) 60 tablet 5  . atorvastatin (LIPITOR) 40 MG tablet Take 1 tablet (40 mg total) by mouth daily at 6 PM. (Patient taking differently: Take 40 mg by mouth daily.) 90 tablet 3  . carvedilol (COREG) 25 MG tablet Take 1 tablet (25 mg total) by mouth 2 (two) times daily. 180 tablet 3  . citalopram (CELEXA) 40 MG tablet TAKE 1 TABLET BY MOUTH EVERY DAY (Patient taking differently: Take 40 mg by mouth daily.) 90 tablet 1  . gabapentin (NEURONTIN) 300 MG capsule Take 3 capsules (900 mg total) by mouth  at bedtime. (Patient taking differently: Take 300-600 mg by mouth See admin instructions. Takes 2 capsule in the morning and 1 capsule at night) 90 capsule 11  . hydrALAZINE (APRESOLINE) 50 MG tablet Take 1/2 tablet each morning and 1 & 1/2 tablets each evening. (Patient taking differently: Take 25-75 mg by mouth See admin instructions. Take 1/2 tablet each morning and 1 & 1/2 tablets each evening.) 180 tablet 3  . metoprolol succinate (TOPROL-XL) 100 MG 24 hr tablet Take 100 mg by mouth 2 (two) times daily.     No facility-administered medications prior to visit.    Allergies  Allergen Reactions  . Entresto [Sacubitril-Valsartan]     Abnormal kidney functions     ROS Review of Systems    Objective:    Physical Exam Constitutional:      General: He is in acute distress.     Appearance: He is diaphoretic. He is not ill-appearing.  HENT:     Head: Normocephalic and atraumatic.  Cardiovascular:     Rate and Rhythm: Bradycardia present.  Musculoskeletal:     Cervical back: Normal range of motion.     Comments: Walker in use unstable with ambulation Slow and sluggish movements  Skin:    General: Skin is warm.     Capillary Refill: Capillary refill takes less than 2 seconds.  Neurological:     Mental Status: He is alert.     Comments: Orient to person and place     BP (!) 61/37   Pulse (!) 52   Temp 97.8 F (36.6 C) (Temporal)   Ht 5\' 7"  (1.702 m)   Wt 153 lb (69.4 kg)   SpO2 90%   BMI 23.96 kg/m  Wt Readings from Last 3 Encounters:  05/14/20 153 lb (69.4 kg)  05/08/20 160 lb (72.6 kg)  04/22/20 159 lb (72.1 kg)     There are no preventive care reminders to display for this patient.  There are no preventive care reminders to display for this patient.  Lab Results  Component Value Date   TSH 1.496 09/20/2019   Lab Results  Component Value Date   WBC 7.4 05/14/2020   HGB 11.5 (L) 05/14/2020   HCT 36.0 (L) 05/14/2020   MCV 88.9 05/14/2020   PLT 205  05/14/2020   Lab Results  Component Value Date   NA 142 05/14/2020   K 4.6 05/14/2020   CO2 21 (L) 05/14/2020   GLUCOSE 94 05/14/2020   BUN 14 05/14/2020   CREATININE 1.81 (H) 05/14/2020   BILITOT 0.9 05/14/2020   ALKPHOS 54 05/14/2020   AST 33 05/14/2020   ALT 25 05/14/2020   PROT 7.3 05/14/2020   ALBUMIN  4.3 05/14/2020   CALCIUM 8.6 (L) 05/14/2020   ANIONGAP 11 05/14/2020   Lab Results  Component Value Date   CHOL 94 (L) 08/15/2019   Lab Results  Component Value Date   HDL 41 08/15/2019   Lab Results  Component Value Date   LDLCALC 35 08/15/2019   Lab Results  Component Value Date   TRIG 93 08/15/2019   Lab Results  Component Value Date   CHOLHDL 2.3 08/15/2019   No results found for: HGBA1C    Assessment & Plan:   Problem List Items Addressed This Visit   None   Visit Diagnoses    Hypotension due to drugs    -  Primary Worsening Patient presented to our office unstable on his feet. BP and HR low on intake. caridology office called to make provider aware dose adjustment discussed. However EMS called for transportation to ED due concerns of overmedicating, dehydration and no support     Bradycardia     Persistent on Metoprolol 100 mg BiD Hx of arrhythmia also on Carvdilol and amiodarone  Surgical clearance not provided due to patient not being sta paperwork in office     No orders of the defined types were placed in this encounter.   Follow-up: No follow-ups on file.    Barbette Merino, NP

## 2020-05-14 NOTE — ED Triage Notes (Signed)
BIBA Per EMS Pt coming from doc appointment with complaints of hypotension; neck pain, fatigue, and nausea. BP per EMS 100/64, Temp 100.6, 116 CBG, 50 HR, 20 RR  20 G L AC

## 2020-05-14 NOTE — Discharge Instructions (Addendum)
You have symptoms may be related to your current blood pressure medication causing a drop in your blood pressure.  Please call and follow-up closely with your primary care doctor or with your cardiologist for medication readjustment.  Stay hydrated.  Return to the ED if your symptoms worsen or if you have any other concern.

## 2020-05-14 NOTE — Telephone Encounter (Signed)
Sp[facility] with [facility] [name] King's office Patient presents to office today with blood pressure 61/37 Home blood pressure readings she has do not have numbers that low Discussed with Roxine Caddy D who saw patient earlier this month Have patient D/C Metoprolol, hold Hydralazine today, and hold Hydrazine for SBP less than 118 Tarsha will relay message to patient

## 2020-05-14 NOTE — ED Notes (Signed)
Spoke with daughter. Coming to pick up father in 30 min.

## 2020-05-14 NOTE — ED Notes (Signed)
Ok for pt to eat and drink. Pt provided with water.

## 2020-05-15 ENCOUNTER — Telehealth: Payer: Self-pay | Admitting: Nurse Practitioner

## 2020-05-15 ENCOUNTER — Encounter: Payer: Self-pay | Admitting: Nurse Practitioner

## 2020-05-15 ENCOUNTER — Telehealth: Payer: Self-pay | Admitting: *Deleted

## 2020-05-15 NOTE — Telephone Encounter (Signed)
Pt called to report elevated BP at home today. Pt states going to eye appt today 2:00. Pt asking if he needs a scan or some other testing. Please advise [phone].

## 2020-05-15 NOTE — Telephone Encounter (Signed)
Called and spoke patient , he had some concerns regarding his b/p  and his medication, he was told follow up  with cardiology ,give him the number to cardiology and to let know that he has a appt on with on 05/23/2020. Pt all want to make an appt [name] made him an appt for 06/30/20 after his appt with cardiology, I told pt if he wasn't able to get though I will call them on his behalf.

## 2020-05-15 NOTE — Telephone Encounter (Signed)
Transition Care Management Follow-up Telephone Call  Date of discharge and from where: 05/14/2020 - [facility]  How have you been since you were released from the hospital? "Now my BP is running high" Pt has a call in to his Cardiologist about this.  Any questions or concerns? No  Items Reviewed:  Did the pt receive and understand the discharge instructions provided? Yes   Medications obtained and verified? Yes   Other? No   Any new allergies since your discharge? No   Dietary orders reviewed? Yes  Do you have support at home? Yes   Home Care and Equipment/Supplies: Were home health services ordered? not applicable If so, what is the name of the agency? N/A  Has the agency set up a time to come to the patient's home? not applicable Were any new equipment or medical supplies ordered?  No What is the name of the medical supply agency? N/A Were you able to get the supplies/equipment? not applicable Do you have any questions related to the use of the equipment or supplies? No  Functional Questionnaire: (I = Independent and D = Dependent) ADLs: I  Bathing/Dressing- I  Meal Prep- I  Eating- I  Maintaining continence- I  Transferring/Ambulation- I  Managing Meds- I  Follow up appointments reviewed:   PCP Hospital f/u appt confirmed? No    Specialist Hospital f/u appt confirmed? Yes  Scheduled to see Cardiology on 05/23/2020 @ 1415.  Are transportation arrangements needed? No   If their condition worsens, is the pt aware to call PCP or go to the Emergency Dept.? Yes  Was the patient provided with contact information for the PCP's office or ED? Yes  Was to pt encouraged to call back with questions or concerns? Yes  Call was completed with a Nepali language interpreter.

## 2020-05-19 ENCOUNTER — Telehealth: Payer: Self-pay | Admitting: Cardiovascular Disease

## 2020-05-19 NOTE — Telephone Encounter (Signed)
Unable to reach pt or leave a message no mailbox set up

## 2020-05-19 NOTE — Telephone Encounter (Signed)
Neww Message:       Pt says he wants to be seen by Dr Duke Salvia today or tomorrow. He says hid blood pressure is running high.  Pt c/o BP issue: STAT if pt c/o blurred vision, one-sided weakness or slurred speech  1. What are your last 5 BP readings?  195/112, 129/91   2. Are you having any other symptoms (ex. Dizziness, headache, blurred vision, passed out)? Dizziness and headache  3. What is your BP issue?  High blood pressure

## 2020-05-20 NOTE — Telephone Encounter (Signed)
Spoke with pt with the help of interpretor [phone], the patient reports his metoprolol was stopped at discharge but he is still taking the carvedilol and hydralazine as prescribed. The morning his bp was 167/103 prior to medications, he has not taken it since then. He reports a headache and dizziness with sitting or walking. Aware will forward to dr Duke Salvia to review and make recommendations.

## 2020-05-21 ENCOUNTER — Emergency Department (HOSPITAL_COMMUNITY)
Admission: EM | Admit: 2020-05-21 | Discharge: 2020-05-21 | Disposition: A | Discharge status: Home / Self Care | Payer: Medicaid Other | Attending: Emergency Medicine | Admitting: Emergency Medicine

## 2020-05-21 ENCOUNTER — Emergency Department (HOSPITAL_COMMUNITY): Payer: Medicaid Other

## 2020-05-21 ENCOUNTER — Encounter (HOSPITAL_COMMUNITY): Payer: Self-pay | Admitting: Emergency Medicine

## 2020-05-21 DIAGNOSIS — Z79899 Other long term (current) drug therapy: Secondary | ICD-10-CM | POA: Diagnosis not present

## 2020-05-21 DIAGNOSIS — I5042 Chronic combined systolic (congestive) and diastolic (congestive) heart failure: Secondary | ICD-10-CM | POA: Diagnosis not present

## 2020-05-21 DIAGNOSIS — Z7901 Long term (current) use of anticoagulants: Secondary | ICD-10-CM | POA: Insufficient documentation

## 2020-05-21 DIAGNOSIS — I110 Hypertensive heart disease with heart failure: Secondary | ICD-10-CM | POA: Diagnosis not present

## 2020-05-21 DIAGNOSIS — E039 Hypothyroidism, unspecified: Secondary | ICD-10-CM | POA: Diagnosis not present

## 2020-05-21 DIAGNOSIS — R519 Headache, unspecified: Secondary | ICD-10-CM | POA: Diagnosis not present

## 2020-05-21 DIAGNOSIS — R42 Dizziness and giddiness: Secondary | ICD-10-CM | POA: Diagnosis present

## 2020-05-21 DIAGNOSIS — I4891 Unspecified atrial fibrillation: Secondary | ICD-10-CM | POA: Diagnosis not present

## 2020-05-21 DIAGNOSIS — Z955 Presence of coronary angioplasty implant and graft: Secondary | ICD-10-CM | POA: Insufficient documentation

## 2020-05-21 DIAGNOSIS — Z87891 Personal history of nicotine dependence: Secondary | ICD-10-CM | POA: Diagnosis not present

## 2020-05-21 DIAGNOSIS — I10 Essential (primary) hypertension: Secondary | ICD-10-CM

## 2020-05-21 LAB — URINALYSIS, ROUTINE W REFLEX MICROSCOPIC
Bilirubin Urine: NEGATIVE
Glucose, UA: NEGATIVE mg/dL
Hgb urine dipstick: NEGATIVE
Ketones, ur: NEGATIVE mg/dL
Leukocytes,Ua: NEGATIVE
Nitrite: NEGATIVE
Protein, ur: NEGATIVE mg/dL
Specific Gravity, Urine: 1.015 (ref 1.005–1.030)
pH: 5 (ref 5.0–8.0)

## 2020-05-21 LAB — BASIC METABOLIC PANEL
Anion gap: 8 (ref 5–15)
BUN: 12 mg/dL (ref 6–20)
CO2: 22 mmol/L (ref 22–32)
Calcium: 9 mg/dL (ref 8.9–10.3)
Chloride: 106 mmol/L (ref 98–111)
Creatinine, Ser: 1.34 mg/dL — ABNORMAL HIGH (ref 0.61–1.24)
GFR, Estimated: >60 mL/min (ref >60)
Glucose, Bld: 132 mg/dL — ABNORMAL HIGH (ref 70–99)
Potassium: 4.2 mmol/L (ref 3.5–5.1)
Sodium: 136 mmol/L (ref 135–145)

## 2020-05-21 LAB — CBC
HCT: 35.2 % — ABNORMAL LOW (ref 39.0–52.0)
Hemoglobin: 11.2 g/dL — ABNORMAL LOW (ref 13.0–17.0)
MCH: 28.2 pg (ref 26.0–34.0)
MCHC: 31.8 g/dL (ref 30.0–36.0)
MCV: 88.7 fL (ref 80.0–100.0)
Platelets: 173 10*3/uL (ref 150–400)
RBC: 3.97 MIL/uL — ABNORMAL LOW (ref 4.22–5.81)
RDW: 14 % (ref 11.5–15.5)
WBC: 5.6 10*3/uL (ref 4.0–10.5)
nRBC: 0 % (ref 0.0–0.2)

## 2020-05-21 LAB — CBG MONITORING, ED: Glucose-Capillary: 82 mg/dL (ref 70–99)

## 2020-05-21 MED ORDER — HYDRALAZINE HCL 20 MG/ML IJ SOLN: 10.0000 mg | Freq: Once | INTRAMUSCULAR | Status: DC

## 2020-05-21 MED ORDER — HYDRALAZINE HCL 50 MG PO TABS
75.0000 mg | ORAL_TABLET | Freq: Once | ORAL | Status: AC
  Administered 2020-05-21: 75 mg via ORAL
  Filled 2020-05-21: qty 1

## 2020-05-21 NOTE — Discharge Instructions (Signed)
Please continue your blood pressure medicines and keep a detailed log of your blood pressures  Please follow-up with your cardiologist as scheduled this week  Return to ER if you have worse dizziness, blood pressure less than 80 or greater than 200, chest pain, trouble breathing

## 2020-05-21 NOTE — ED Triage Notes (Signed)
Patient BIB [facility] for recurrent orthostatic hypotension, patient seen previously for same and has appointment with cardiologist to discuss medication adjustment. Patient alert, oriented, and in no apparent distress at this time. 18g left foream, 500 mL NS infused PTA.

## 2020-05-21 NOTE — Progress Notes (Signed)
CSW consulted by EDP to assist with transportation needs. CSW met with Pt at bedside [employer] interpreter system #[id number] was used due to Pt speaking primarily [place].  Pt is already in [employer] system. CSW arranged transportation home and then waited outside with Pt to ensure that he was able to connect with [employer] driver.   05/21/20 2204  TOC ED Mini Assessment  TOC Time spent with patient (minutes): 25  PING Used in TOC Assessment No  Admission or Readmission Diverted Yes  Interventions which prevented an admission or readmission Transportation Screening  What brought you to the Emergency Department?  Hypotension  Barriers to Discharge No Barriers Identified  Barrier interventions provided Pt with transportation home via [facility] of departure [employer])

## 2020-05-21 NOTE — ED Provider Notes (Signed)
[facility] EMERGENCY DEPARTMENT Provider Note   CSN: [id number] Arrival date & time: 05/21/20  1310     History Chief Complaint  Patient presents with  . Dizziness    Seth Snyder is a 60 y.o. male history of A. fib on Eliquis, hypertension, anemia, here presenting with dizziness and hypotension.  Patient apparently has history of orthostatic hypotension.  He came in with a blood pressure log and in the mornings it usually runs low and runs in the 160s to 180s in the evenings.  Patient states that this morning he woke up and he had some dizziness and headache.  He then took his blood pressure and it was 80s and then went down to the 60s.  He called EMS and he was given 500 cc bolus prior to arrival.  His arrival blood pressure was 150.  He denies any chest pain or shortness of breath.  Patient was seen in the ED about a week ago for similar symptoms.  He is still taking hydralazine 75 mg twice daily.  His metoprolol was stopped last time.  He is scheduled for follow-up with cardiology in 2 days  The history is provided by the patient. A language interpreter was used.       Past Medical History:  Diagnosis Date  . Anemia   . Atrial fibrillation (HCC)   . Hypertension   . Low back pain     Patient Active Problem List   Diagnosis Date Noted  . MDD (major depressive disorder), recurrent episode, moderate (HCC) 04/15/2020  . Persistent atrial fibrillation (HCC) 09/20/2019  . Secondary hypercoagulable state (HCC) 09/20/2019  . Abnormal nuclear stress test   . Spinal stenosis of cervical region 08/13/2019  . Protrusion of cervical intervertebral disc 08/13/2019  . Shoulder pain   . Chronic combined systolic and diastolic heart failure (HCC)   . Atrial fibrillation with RVR (HCC) 05/07/2019  . Hypertension 05/07/2019  . Hypothyroidism 05/07/2019  . Hypomagnesemia 05/07/2019  . Elevated troponin 05/07/2019    Past Surgical History:  Procedure Laterality  Date  . CARDIOVERSION N/A 05/10/2019   Procedure: CARDIOVERSION;  Surgeon: Lewayne Bunting, MD;  Location: [facility] ENDOSCOPY;  Service: Cardiovascular;  Laterality: N/A;  . COLONOSCOPY  07/12/2013  . LEFT HEART CATH AND CORONARY ANGIOGRAPHY N/A 08/30/2019   Procedure: LEFT HEART CATH AND CORONARY ANGIOGRAPHY;  Surgeon: Corky Crafts, MD;  Location: [facility] INVASIVE CV LAB;  Service: Cardiovascular;  Laterality: N/A;  . TEE WITHOUT CARDIOVERSION N/A 05/10/2019   Procedure: TRANSESOPHAGEAL ECHOCARDIOGRAM (TEE);  Surgeon: Lewayne Bunting, MD;  Location: [facility] ENDOSCOPY;  Service: Cardiovascular;  Laterality: N/A;       Family History  Problem Relation Age of Onset  . Diabetes Mother   . Diabetes Sister   . Mental illness Sister   . Mental illness Brother     Social History   Tobacco Use  . Smoking status: Former Smoker    Quit date: 12/21/2018    Years since quitting: 1.4  . Smokeless tobacco: Never Used  Vaping Use  . Vaping Use: Never used  Substance Use Topics  . Alcohol use: Not Currently  . Drug use: Never    Home Medications Prior to Admission medications   Medication Sig Start Date End Date Taking? Authorizing Provider  acetaminophen (TYLENOL) 500 MG tablet Take 1,000 mg by mouth daily as needed for mild pain.    [provider]  amiodarone (PACERONE) 200 MG tablet TAKE 1 TABLET BY  MOUTH EVERY DAY Patient taking differently: Take 200 mg by mouth daily. 03/31/20   Chilton Si, MD  apixaban (ELIQUIS) 5 MG TABS tablet TAKE 1 TABLET (5 MG TOTAL) BY MOUTH 2 (TWO) TIMES DAILY. Patient taking differently: Take 5 mg by mouth 2 (two) times daily. 12/21/19   Chilton Si, MD  atorvastatin (LIPITOR) 40 MG tablet Take 1 tablet (40 mg total) by mouth daily at 6 PM. Patient taking differently: Take 40 mg by mouth daily. 08/27/19   Chilton Si, MD  carvedilol (COREG) 25 MG tablet Take 1 tablet (25 mg total) by mouth 2 (two) times daily. 03/11/20 06/09/20  Chilton Si, MD  citalopram (CELEXA) 40 MG tablet TAKE 1 TABLET BY MOUTH EVERY DAY Patient taking differently: Take 40 mg by mouth daily. 05/13/20   Zena Amos, MD  gabapentin (NEURONTIN) 300 MG capsule Take 3 capsules (900 mg total) by mouth at bedtime. Patient taking differently: Take 300-600 mg by mouth See admin instructions. Takes 2 capsule in the morning and 1 capsule at night 03/31/20 03/31/21  Barbette Merino, NP  hydrALAZINE (APRESOLINE) 50 MG tablet Take 1/2 tablet each morning and 1 & 1/2 tablets each evening. Patient taking differently: Take 25-75 mg by mouth See admin instructions. Take 1/2 tablet each morning and 1 & 1/2 tablets each evening. 05/08/20   Chilton Si, MD  metoprolol tartrate (LOPRESSOR) 100 MG tablet Take 100 mg by mouth 2 (two) times daily.    [provider]    Allergies    Entresto [sacubitril-valsartan]  Review of Systems   Review of Systems  Neurological: Positive for dizziness.  All other systems reviewed and are negative.   Physical Exam Updated Vital Signs BP (!) 216/100   Pulse (!) 55   Temp 98 F (36.7 C)   Resp 17   SpO2 99%   Physical Exam Vitals and nursing note reviewed.  Constitutional:      Appearance: Normal appearance.  HENT:     Head: Normocephalic.     Nose: Nose normal.     Mouth/Throat:     Mouth: Mucous membranes are moist.  Eyes:     Extraocular Movements: Extraocular movements intact.     Pupils: Pupils are equal, round, and reactive to light.  Cardiovascular:     Rate and Rhythm: Normal rate and regular rhythm.     Pulses: Normal pulses.     Heart sounds: Normal heart sounds.  Pulmonary:     Effort: Pulmonary effort is normal.     Breath sounds: Normal breath sounds.  Abdominal:     General: Abdomen is flat.     Palpations: Abdomen is soft.  Musculoskeletal:        General: Normal range of motion.     Cervical back: Normal range of motion and neck supple.  Skin:    General: Skin is warm.      Capillary Refill: Capillary refill takes less than 2 seconds.  Neurological:     General: No focal deficit present.     Mental Status: He is alert and oriented to person, place, and time.     Cranial Nerves: No cranial nerve deficit.     Sensory: No sensory deficit.     Motor: No weakness.  Psychiatric:        Mood and Affect: Mood normal.        Behavior: Behavior normal.     ED Results / Procedures / Treatments   Labs (all labs ordered are listed,  but only abnormal results are displayed) Labs Reviewed  BASIC METABOLIC PANEL - Abnormal; Notable for the following components:      Result Value   Glucose, Bld 132 (*)    Creatinine, [place] 1.34 (*)    All other components within normal limits  CBC - Abnormal; Notable for the following components:   RBC 3.97 (*)    Hemoglobin 11.2 (*)    HCT 35.2 (*)    All other components within normal limits  URINALYSIS, ROUTINE W REFLEX MICROSCOPIC  CBG MONITORING, ED    EKG EKG Interpretation  Date/Time:  Wednesday May 21 2020 13:15:13 EST Ventricular Rate:  53 PR Interval:  188 QRS Duration: 96 QT Interval:  526 QTC Calculation: 493 R Axis:   43 Text Interpretation: Sinus bradycardia Incomplete right bundle branch block Prolonged QT Abnormal ECG No significant change since last tracing Confirmed by Richardean Canal [id number]) on 05/21/2020 8:21:20 PM   Radiology No results found.  Procedures Procedures   Medications Ordered in ED Medications  hydrALAZINE (APRESOLINE) tablet 75 mg (has no administration in time range)    ED Course  I have reviewed the triage vital signs and the nursing notes.  Pertinent labs & imaging results that were available during my care of the patient were reviewed by me and considered in my medical decision making (see chart for details).    MDM Rules/Calculators/A&P                         Dametrius Linsey Hirota is a 60 y.o. male here with dizziness.  Patient states that his blood pressure was in the 60s  earlier.  Patient's blood pressure was 150s on arrival.  He was waiting in the triage for about 7 hours did not take his evening dose now his blood pressure is over 200.  I do not know why his blood pressure is so volatile.  Plan to get CT head to rule out bleed.  Also get basic blood work.  We will give his evening dose of blood pressure medicine.   9:24 PM Blood pressure down to 170s after home dose of hydralazine.  Patient to stand up and feel dizzy at blood pressure 170s.  He is very sensitive to blood pressure medicine it seems like.  His CT head is normal.  Since he has cardiology follow-up in 2 days, I want to have him keep a blood pressure log and show to his cardiologist.   Final Clinical Impression(s) / ED Diagnoses Final diagnoses:  None    Rx / DC Orders ED Discharge Orders    None       Charlynne Pander, MD 05/21/20 2125

## 2020-05-22 NOTE — Progress Notes (Signed)
Cardiology Clinic Note   Patient Name: Seth Snyder Date of Encounter: 05/23/2020  Primary Care Provider:  Barbette Merino, NP Primary Cardiologist:  Chilton Si, MD  Patient Profile    Seth Snyder 60 year old male presents today for follow-up of his atrial fibrillation, HTN, CAD, and combined systolic and diastolic heart failure.  Past Medical History    Past Medical History:  Diagnosis Date  . Anemia   . Atrial fibrillation (HCC)   . Hypertension   . Low back pain    Past Surgical History:  Procedure Laterality Date  . CARDIOVERSION N/A 05/10/2019   Procedure: CARDIOVERSION;  Surgeon: Lewayne Bunting, MD;  Location: [facility] ENDOSCOPY;  Service: Cardiovascular;  Laterality: N/A;  . COLONOSCOPY  07/12/2013  . LEFT HEART CATH AND CORONARY ANGIOGRAPHY N/A 08/30/2019   Procedure: LEFT HEART CATH AND CORONARY ANGIOGRAPHY;  Surgeon: Corky Crafts, MD;  Location: [facility] INVASIVE CV LAB;  Service: Cardiovascular;  Laterality: N/A;  . TEE WITHOUT CARDIOVERSION N/A 05/10/2019   Procedure: TRANSESOPHAGEAL ECHOCARDIOGRAM (TEE);  Surgeon: Lewayne Bunting, MD;  Location: [facility] ENDOSCOPY;  Service: Cardiovascular;  Laterality: N/A;    Allergies  Allergies  Allergen Reactions  . Entresto [Sacubitril-Valsartan]     Abnormal kidney functions     History of Present Illness    Seth Snyder a PMH of paroxysmal [place] He presented to [facility] and was admitted 02/12/2019-02/17/2019 for hypertensive urgency and A. fib/flutter. He was started on Eliquis 5 mg twice daily, aspirin 81 mg and metoprolol ER 100 mg. He followed up with his PCP 03/12/2019 and was stable. At that time he indicated that he was moving from [place] to [facility]. His PMH also includes hypothyroidism, lower extremity venous insufficiency, HTN, anemia, and tobacco abuse.  He was admitted to the hospital  05/07/2019-05/10/2019 with A. fib RVR. His echocardiogram 05/07/2019 showed an LVEF of 45-50%, diastolic dysfunction could not be evaluated, mildly elevated pulmonary arterial pressures, mild mitral regurgitation, tricuspid regurgitation mild-moderate, and mild aortic regurgitation. He underwent TEE and DCCV 05/10/2019 which was successful. (120 J to normal sinus rhythm, atrial fibrillation recurred and repeat DCCV with 150 J resulted in sinus rhythm ). He was started on metoprolol 150 mg and due to his LV function, age, and lack of insurance options for antiarrhythmics were limited. His thyroid function was also noted to be mildly abnormal making amiodarone an undesirable option. Apixaban 5mg twice daily was continued.  He presented to the clinic 05/16/19 for follow-up and statedhe was having right shoulder pain. This had been ongoing for several months. He did not recall any fall, work-related injury or trauma. His pain was reproducible in the office  with light and deep palpation.When asked about his breathing and his heart rate he stated he felt well. He had not noticed any irregular heartbeats or increased heart rate since his cardioversion. He had been compliant with Eliquis.  He underwent a nuclear stress test on 05/23/2019.  There was no ST deviation during his stress test.  2 defects were noted.  There was a fixed defect at the apex that suggested infarct and a reversible inferior defect that suggested ischemia.  Cardiac catheterization was recommended.  Several attempts were made to contact the patient and detailed messages were left about scheduling a cardiac catheterization.  A translated letter was also sent.  He presents the clinic today for follow-up evaluation and states he does not have any chest pain.  He is no  longer working due to his right shoulder and neck pain.  His stress test results were reviewed and he is agreeable to cardiac catheterization.  I explained to him that in  order to give him preoperative cardiac clearance the procedure needs to be done.  He states he has had occasional periods of palpitations but overall feels well today except for his neck and shoulder pain.  He was seen by Dr. Duke Salvia on 03/11/2020.  At that time he was noted to be hypertensive and was not cleared for upcoming surgery.  He complained of itchiness and palpitations.  He was seen by Dr. Johney Frame 03/26/2020.  During that time he was doing fairly well.  His palpitations were controlled on amiodarone.  Dr. Johney Frame wished to continue to treat medically as opposed to proceed with ablation procedure due to cardiomyopathy and hypertension.  Recommended reducing amiodarone to 100 mg if no recurrence of atrial fibrillation in 6 months.  He presented to the emergency department 05/21/20 with complaints of dizziness and low blood pressure.  Has history of orthostatic hypotension was reviewed.  He reported that he was dizzy and had a headache when he woke up in the morning.  He called EMS.  He was given a 500 cc fluid bolus.  His blood pressure on arrival was 150 systolic.  He denied chest pain or shortness of breath.  He had been seen in the emergency department 1 week prior for similar symptoms.  He continued to take hydralazine 75 mg twice daily.  A CT of his head showed no acute findings.  His blood pressure rose to over 200 systolic and then decreased to 170s systolic with hydralazine dosing.  He was asked to maintain a blood pressure log and follow-up with cardiology.  Discharged 2143.  He presents to the clinic today for follow-up evaluation states he continues to have shortness of breath and dyspnea with exertion.  We reviewed his prior echocardiogram and cardiac catheterization.  He reports that he has constant chest pressure which is worse with deep breaths.  His heart rate today is 57.  He continues to have right arm pain as well.  He reports that he has been trying to stay better hydrated.  His blood  pressure today is 132/82.  I will refer him to pulmonology for an evaluation of his dyspnea and to have PFTs.  We will continue his current medication regimen and have him follow-up in 3 months.  Today hedenies shortness of breath, lower extremity edema, fatigue, palpitations, melena, hematuria, hemoptysis, diaphoresis, presyncope, syncope, orthopnea, and PND.  Home Medications    Prior to Admission medications   Medication Sig Start Date End Date Taking? Authorizing Provider  acetaminophen (TYLENOL) 500 MG tablet Take 1,000 mg by mouth daily as needed for mild pain.    [provider]  amiodarone (PACERONE) 200 MG tablet TAKE 1 TABLET BY MOUTH EVERY DAY Patient taking differently: Take 200 mg by mouth daily. 03/31/20   Chilton Si, MD  apixaban (ELIQUIS) 5 MG TABS tablet TAKE 1 TABLET (5 MG TOTAL) BY MOUTH 2 (TWO) TIMES DAILY. Patient taking differently: Take 5 mg by mouth 2 (two) times daily. 12/21/19   Chilton Si, MD  atorvastatin (LIPITOR) 40 MG tablet Take 1 tablet (40 mg total) by mouth daily at 6 PM. Patient taking differently: Take 40 mg by mouth daily. 08/27/19   Chilton Si, MD  carvedilol (COREG) 25 MG tablet Take 1 tablet (25 mg total) by mouth 2 (two) times daily. 03/11/20  06/09/20  Chilton Si, MD  citalopram (CELEXA) 40 MG tablet TAKE 1 TABLET BY MOUTH EVERY DAY Patient taking differently: Take 40 mg by mouth daily. 05/13/20   Zena Amos, MD  gabapentin (NEURONTIN) 300 MG capsule Take 3 capsules (900 mg total) by mouth at bedtime. Patient taking differently: Take 300-600 mg by mouth See admin instructions. Takes 2 capsule in the morning and 1 capsule at night 03/31/20 03/31/21  Barbette Merino, NP  hydrALAZINE (APRESOLINE) 50 MG tablet Take 1/2 tablet each morning and 1 & 1/2 tablets each evening. Patient taking differently: Take 25-75 mg by mouth See admin instructions. Take 1/2 tablet each morning and 1 & 1/2 tablets each evening. 05/08/20    Chilton Si, MD  metoprolol tartrate (LOPRESSOR) 100 MG tablet Take 100 mg by mouth 2 (two) times daily.    [provider]    Family History    Family History  Problem Relation Age of Onset  . Diabetes Mother   . Diabetes Sister   . Mental illness Sister   . Mental illness Brother    He indicated that his mother is deceased. He indicated that his father is alive. He indicated that all of his four sisters are alive. He indicated that his brother is alive. He indicated that his maternal grandmother is deceased. He indicated that his maternal grandfather is deceased. He indicated that his paternal grandmother is deceased. He indicated that his paternal grandfather is deceased.  Social History    Social History   Socioeconomic History  . Marital status: Single    Spouse name: Not on file  . Number of children: Not on file  . Years of education: Not on file  . Highest education level: Not on file  Occupational History  . Not on file  Tobacco Use  . Smoking status: Former Smoker    Quit date: 12/21/2018    Years since quitting: 1.4  . Smokeless tobacco: Never Used  Vaping Use  . Vaping Use: Never used  Substance and Sexual Activity  . Alcohol use: Not Currently  . Drug use: Never  . Sexual activity: Not on file  Other Topics Concern  . Not on file  Social History Narrative  . Not on file   Social Determinants of Health   Financial Resource Strain: Not on file  Food Insecurity: No Food Insecurity  . Worried About Programme researcher, broadcasting/film/video in the Last Year: Never true  . Ran Out of Food in the Last Year: Never true  Transportation Needs: Unmet Transportation Needs  . Lack of Transportation (Medical): Yes  . Lack of Transportation (Non-Medical): Yes  Physical Activity: Not on file  Stress: Not on file  Social Connections: Not on file  Intimate Partner Violence: Not on file     Review of Systems    General:  No chills, fever, night sweats or weight changes.   Cardiovascular:  No chest pain, dyspnea on exertion, edema, orthopnea, palpitations, paroxysmal nocturnal dyspnea. Dermatological: No rash, lesions/masses Respiratory: No cough, dyspnea Urologic: No hematuria, dysuria Abdominal:   No nausea, vomiting, diarrhea, bright red blood per rectum, melena, or hematemesis Neurologic:  No visual changes, wkns, changes in mental status. All other systems reviewed and are otherwise negative except as noted above.  Physical Exam    VS:  BP 132/82   Pulse (!) 57   Ht 5\' 7"  (1.702 m)   Wt 154 lb 3.2 oz (69.9 kg)   SpO2 95%   BMI 24.15  kg/m  , BMI Body mass index is 24.15 kg/m. GEN: Well nourished, well developed, in no acute distress. HEENT: normal. Neck: Supple, no JVD, carotid bruits, or masses. Cardiac: RRR, no murmurs, rubs, or gallops. No clubbing, cyanosis, edema.  Radials/DP/PT 2+ and equal bilaterally.  Respiratory:  Respirations regular and unlabored, clear to auscultation bilaterally. GI: Soft, nontender, nondistended, BS + x 4. MS: no deformity or atrophy. Skin: warm and dry, no rash. Neuro:  Strength and sensation are intact. Psych: Normal affect.  Accessory Clinical Findings    Recent Labs: 09/20/2019: TSH 1.496 05/14/2020: ALT 25 05/21/2020: BUN 12; Creatinine, Jenner 1.34; Hemoglobin 11.2; Platelets 173; Potassium 4.2; Sodium 136   Recent Lipid Panel    Component Value Date/Time   CHOL 94 (L) 08/15/2019 0925   TRIG 93 08/15/2019 0925   HDL 41 08/15/2019 0925   CHOLHDL 2.3 08/15/2019 0925   CHOLHDL 2.8 05/07/2019 0823   VLDL 17 05/07/2019 0823   LDLCALC 35 08/15/2019 0925    ECG personally reviewed by me today-none today. EKG 08/31/2019  atrial fibrillation with RVR 117 bpm  EKG 05/16/2019 atrial fibrillation moderate voltage criteria for LVH 90 bpm  EKG 05/10/2019 Normal sinus rhythm 60 bpm  Echocardiogram 05/07/2019 IMPRESSIONS    1. Left ventricular ejection fraction, by estimation, is 45 to 50%. The  left  ventricle has mildly decreased function. The left ventricle  demonstrates global hypokinesis. Left ventricular diastolic function could  not be evaluated.  2. Right ventricular systolic function is low normal. The right  ventricular size is normal. There is mildly elevated pulmonary artery  systolic pressure.  3. The mitral valve is grossly normal. Mild mitral valve regurgitation.  4. Tricuspid valve regurgitation is mild to moderate.  5. The aortic valve is tricuspid. Aortic valve regurgitation is mild. No  aortic stenosis is present.  6. There is mild dilatation of the ascending aorta measuring 39 mm.  7. The inferior vena cava is dilated in size with >50% respiratory  variability, suggesting right atrial pressure of 8 mmHg.   TEE 05/10/2019 IMPRESSIONS    1. Moderate to severe global reduction in LV systolic function; mild AI  and MR; mild LAE; spontaneous contrast in LAA but no thrombus; mild RAE  and RVE; moderate TR.  2. Left ventricular ejection fraction, by estimation, is 30 to 35%. The  left ventricle has moderately decreased function. The left ventricle  demonstrates global hypokinesis. Left ventricular diastolic function could  not be evaluated.  3. Right ventricular systolic function is normal. The right ventricular  size is mildly enlarged.  4. Left atrial size was mildly dilated. No left atrial/left atrial  appendage thrombus was detected.  5. Right atrial size was mildly dilated.  6. The mitral valve is normal in structure and function. Mild mitral  valve regurgitation.  7. Tricuspid valve regurgitation is moderate.  8. The aortic valve is tricuspid. Aortic valve regurgitation is mild.  9. There is mild (Grade II) plaque involving the descending aorta.   Conclusion(s)/Recomendation(s): No LA/LAA thrombus identified. Successful  cardioversion performed with restoration of normal sinus rhythm.  Nuclear stress test 05/23/2019  There was no ST segment  deviation noted during stress.  Study was not gated due to atrial fibrillation  Defect 1: There is a small fixed defect of mild severity present in the apex location.  Defect 2: There is a small reversible defect of mild severity present in the basal inferior, mid inferior and apical inferior location.  Findings consistent with ischemia  and prior myocardial infarction.  This is a low risk study given small area of ischemia.  Fixed defect at apex, suggests infarct. Reversible inferior defect, suggests ischemia.  Cardiac catheterization 08/30/2019  There is severe left ventricular systolic dysfunction.  The left ventricular ejection fraction is less than 25% by visual estimate.  LV end diastolic pressure is normal. LVEDP 12 mm Hg.  There is no aortic valve stenosis.  No significant CAD.  Severe vasospasm of right radial artery, even with 4 Fr catheter. Would not use the right radial artery if cath was needed in the future.   Medical therapy for nonischemic cardiomyopathy.   Resume Eliquis tomorrow morning.   Echocardiogram 03/26/2020  IMPRESSIONS    1. Left ventricular ejection fraction, by estimation, is 60 to 65%. Left  ventricular ejection fraction by 3D volume is 61 %. The left ventricle has  normal function. The left ventricle has no regional wall motion  abnormalities. Left ventricular diastolic  parameters were normal.  2. Right ventricular systolic function is normal. The right ventricular  size is normal. There is normal pulmonary artery systolic pressure.  3. The mitral valve is normal in structure. No evidence of mitral valve  regurgitation. No evidence of mitral stenosis.  4. The aortic valve is normal in structure. Aortic valve regurgitation is  mild. No aortic stenosis is present. Aortic regurgitation PHT measures 458  msec.  5. Aortic dilatation noted. There is mild dilatation of the ascending  aorta, measuring 40 mm.  6. The inferior vena cava is  normal in size with greater than 50%  respiratory variability, suggesting right atrial pressure of 3 mmHg.   Comparison(s): Prior images reviewed side by side. The left ventricular  function has improved.  Assessment & Plan   1.  DOE-reports that he has increased dyspnea with exertion.  We reviewed his echocardiogram and his normal ejection fraction.  He reports that he has been less physically active and this appears to be related to deconditioning versus pulmonary issues. Refer to pulmonology   Nonischemic cardiomyopathy-no increased DOE or activity intolerance. Cardiac catheterization 08/30/2019 showed an LVEF of 25%. Echocardiogram 03/26/2020 showed improved LVEF of 60-65%, and mild aortic valve regurgitation. Continue hydralazine Heart healthy low-sodium diet-salty 6 given Increase physical activity as tolerated  Atrial fibrillation with RVR-heart rate today 57.  EKG 3/21 showedatrial fibrillation 90 bpm. TEE with DCCV x2 on 05/10/2019. [place] cardioversion on 01/2019. Then relocated to [facility] and ran out of his medications.He continues to be cardiac unaware and feels his breathing is at his baseline.  Follows with Dr. Johney Frame who has discussed ablation procedure and wished to continue with amiodarone therapy at this time. Continue amiodarone, carvedilol Continue apixaban 5 mg tablet twice daily Avoid triggers caffeine, chocolate, EtOH etc. Order nuclear stress test to evaluate for ischemia.  Coronary artery disease-reports chronic chest pressure worse with deep breathing. Underwent cardiac catheterization 08/30/2019 which showed no significant coronary artery disease and an LVEF of 25%. Medical therapy for nonischemic cardiomyopathy was recommended. Details above. Nuclear stress test 05/23/2019 showed 2 defects.  One being reversible and inferior in nature. Continue carvedilol Heart healthy low-sodium diet-salty 6 given Increase  physical activity as tolerated   Essential hypertension-BP today 132/82 Continue carvedilol, hydralazine heart healthy low-sodium diet Increase physical activity as tolerated   Aortic atherosclerosis-goal LDL less than 70.08/15/2019: Cholesterol, Total 94; HDL 41; LDL Chol Calc (NIH) 35; Triglycerides 93 Continue atorvastatin 40 mg daily Heart healthy  low-sodium high-fiber diet Increase physical activity as tolerated  Right shoulder pain/neck pain-painful flexion, extension, internal and external rotation. No trauma, falls, work-related injuries. Pain has been present for 3 more months. Primary referred to orthopedics who wanted to treat conservatively.  Disposition: Follow-up with Dr. Duke Salviaandolph in 3 month.   Thomasene RippleJesse M. Madelyn Tlatelpa NP-C    05/23/2020, 2:28 PM [facility] [address] Office [phone] Fax [phone]  Notice: This dictation was prepared with Dragon dictation along with smaller phrase technology. Any transcriptional errors that result from this process are unintentional and may not be corrected upon review.  I spent 15 minutes examining this patient, reviewing medications, and using patient centered shared decision making involving her cardiac care.  Prior to her visit I spent greater than 20 minutes reviewing her past medical history,  medications, and prior cardiac tests.

## 2020-05-23 ENCOUNTER — Encounter: Payer: Self-pay | Admitting: General Practice

## 2020-05-23 ENCOUNTER — Ambulatory Visit (INDEPENDENT_AMBULATORY_CARE_PROVIDER_SITE_OTHER): Payer: Medicaid Other | Admitting: General Practice

## 2020-05-23 ENCOUNTER — Other Ambulatory Visit: Payer: Self-pay

## 2020-05-23 VITALS — BP 132/82 | HR 57 | Ht 67.0 in | Wt 154.2 lb

## 2020-05-23 DIAGNOSIS — I10 Essential (primary) hypertension: Secondary | ICD-10-CM

## 2020-05-23 DIAGNOSIS — I4819 Other persistent atrial fibrillation: Secondary | ICD-10-CM

## 2020-05-23 DIAGNOSIS — R0609 Other forms of dyspnea: Secondary | ICD-10-CM

## 2020-05-23 DIAGNOSIS — I428 Other cardiomyopathies: Secondary | ICD-10-CM | POA: Diagnosis not present

## 2020-05-23 DIAGNOSIS — I2510 Atherosclerotic heart disease of native coronary artery without angina pectoris: Secondary | ICD-10-CM | POA: Diagnosis not present

## 2020-05-23 DIAGNOSIS — I358 Other nonrheumatic aortic valve disorders: Secondary | ICD-10-CM

## 2020-05-23 DIAGNOSIS — R0602 Shortness of breath: Secondary | ICD-10-CM

## 2020-05-23 DIAGNOSIS — M25511 Pain in right shoulder: Secondary | ICD-10-CM

## 2020-05-23 DIAGNOSIS — R0600 Dyspnea, unspecified: Secondary | ICD-10-CM

## 2020-05-23 NOTE — Telephone Encounter (Signed)
Patient was seen in clinic by Youlanda Mighty NP today

## 2020-05-23 NOTE — Patient Instructions (Signed)
Medication Instructions:  The current medical regimen is effective;  continue present plan and medications as directed. Please refer to the Current Medication list given to you today.  *If you need a refill on your cardiac medications before your next appointment, please call your pharmacy*  Lab Work:   Testing/Procedures:  NONE    NONE  Special Instructions REFERRAL TO PULMONARY  PLEASE INCREASE PHYSICAL ACTIVITY AS TOLERATED  Follow-Up: Your next appointment:  3 month(s) In Person with Chilton Si, MD OR IF UNAVAILABLE JESSE CLEAVER, FNP-C  At [facility], you and your health needs are our priority.  As part of our continuing mission to provide you with exceptional heart care, we have created designated Provider Care Teams.  These Care Teams include your primary Cardiologist (physician) and Advanced Practice Providers (APPs -  Physician Assistants and Nurse Practitioners) who all work together to provide you with the care you need, when you need it.            6 SALTY THINGS TO AVOID     1,800MG  DAILY

## 2020-06-06 ENCOUNTER — Telehealth: Payer: Self-pay | Admitting: General Practice

## 2020-06-06 NOTE — Telephone Encounter (Signed)
Spoke with patient's daughter regarding the Thursday 06/26/20 11:32am with Dr. Isaiah Serge at [facility]---[address], [address]--arrival time is 11:15 am for check in---Will mail information to patient and it is also in My Chart.  She voiced her understanding.n

## 2020-06-11 ENCOUNTER — Encounter (HOSPITAL_COMMUNITY): Payer: Self-pay | Admitting: Psychiatry

## 2020-06-11 ENCOUNTER — Ambulatory Visit (INDEPENDENT_AMBULATORY_CARE_PROVIDER_SITE_OTHER): Payer: Medicaid Other | Admitting: Psychiatry

## 2020-06-11 ENCOUNTER — Other Ambulatory Visit: Payer: Self-pay

## 2020-06-11 DIAGNOSIS — F331 Major depressive disorder, recurrent, moderate: Secondary | ICD-10-CM

## 2020-06-11 DIAGNOSIS — F3341 Major depressive disorder, recurrent, in partial remission: Secondary | ICD-10-CM | POA: Diagnosis not present

## 2020-06-11 MED ORDER — CITALOPRAM HYDROBROMIDE 40 MG PO TABS: 40.0000 mg | ORAL_TABLET | Freq: Every day | ORAL | 1 refills | Status: DC

## 2020-06-11 MED ORDER — TRAZODONE HCL 50 MG PO TABS: ORAL_TABLET | ORAL | 1 refills | Status: DC

## 2020-06-11 NOTE — Progress Notes (Signed)
BH OP Progress Note   Patient Identification: Seth Snyder MRN:  [id number] Date of Evaluation:  06/11/2020   The session was conducted in [place] with help of Nepali speaking interpreter via [employer].  Chief Complaint:   " I am the same."  Visit Diagnosis:    ICD-10-CM   1. MDD (major depressive disorder), recurrent, in partial remission (HCC)  F33.41     History of Present Illness: This is a 60 year old male with history of hypertension, atrial fibrillation, anemia, chronic back pain secondary to spinal stenosis with compression of right side with C7 radiculopathy.    Pt reported that he continues to feel sad at times due to his chronic back pain secondary to spinal stenosis with compression of right side with C7 radiculopathy. He stated that now his care giver has started to come regularly and that has helped him with his personal chores. He still deals with a lot of pain that has limited his ability to do much. He informed that he has not been able to work since December 2020. He reported feeling depressed and low energy.  He denied having any suicidal ideations or thoughts of hurting himself in any way. He shared his blood pressure log with the writer and it was noted that his blood pressure is quite high in the mornings and as well as in the evenings but drops down significantly in the afternoon middle of the day. Patient is scheduled to see cardiologist Dr. Duke Salvia on April 5.  Patient was advised to bring this log with him on that day to share it with them. Writer recommended that he continues to work on his other health conditions including his pain condition. Writer recommended that we continue the same regimen for now and the patient agreed to continue to do so.  He did however complain that he has difficulty in falling asleep and asked if he could be prescribed something to help him to sleep. Writer offered him trazodone. Potential side effects of  medication and risks vs benefits of treatment vs non-treatment were explained and discussed. All questions were answered. Patient was agreeable to try that.   Past Psychiatric History: Depression, anxiety  Previous Psychotropic Medications: Yes  - celexa  Substance Abuse History in the last 12 months:  No.  Consequences of Substance Abuse: NA  Past Medical History:  Past Medical History:  Diagnosis Date  . Anemia   . Atrial fibrillation (HCC)   . Hypertension   . Low back pain     Past Surgical History:  Procedure Laterality Date  . CARDIOVERSION N/A 05/10/2019   Procedure: CARDIOVERSION;  Surgeon: Lewayne Bunting, MD;  Location: [facility] ENDOSCOPY;  Service: Cardiovascular;  Laterality: N/A;  . COLONOSCOPY  07/12/2013  . LEFT HEART CATH AND CORONARY ANGIOGRAPHY N/A 08/30/2019   Procedure: LEFT HEART CATH AND CORONARY ANGIOGRAPHY;  Surgeon: Corky Crafts, MD;  Location: [facility] INVASIVE CV LAB;  Service: Cardiovascular;  Laterality: N/A;  . TEE WITHOUT CARDIOVERSION N/A 05/10/2019   Procedure: TRANSESOPHAGEAL ECHOCARDIOGRAM (TEE);  Surgeon: Lewayne Bunting, MD;  Location: [facility] ENDOSCOPY;  Service: Cardiovascular;  Laterality: N/A;    Family Psychiatric History: Siblings have some mental health condition, pt now aware of the details.  Family History:  Family History  Problem Relation Age of Onset  . Diabetes Mother   . Diabetes Sister   . Mental illness Sister   . Mental illness Brother     Social History:   Social History  Socioeconomic History  . Marital status: Single    Spouse name: Not on file  . Number of children: Not on file  . Years of education: Not on file  . Highest education level: Not on file  Occupational History  . Not on file  Tobacco Use  . Smoking status: Former Smoker    Quit date: 12/21/2018    Years since quitting: 1.4  . Smokeless tobacco: Never Used  Vaping Use  . Vaping Use: Never used  Substance and Sexual Activity  . Alcohol use: Not  Currently  . Drug use: Never  . Sexual activity: Not on file  Other Topics Concern  . Not on file  Social History Narrative  . Not on file   Social Determinants of Health   Financial Resource Strain: Not on file  Food Insecurity: No Food Insecurity  . Worried About Programme researcher, broadcasting/film/video in the Last Year: Never true  . Ran Out of Food in the Last Year: Never true  Transportation Needs: Unmet Transportation Needs  . Lack of Transportation (Medical): Yes  . Lack of Transportation (Non-Medical): Yes  Physical Activity: Not on file  Stress: Not on file  Social Connections: Not on file    Additional Social History: Lives with his girlfriend, daughter, son-in-law and 61-year-old grandson.  Currently unemployed and is dealing with financial constraints.  Allergies:   Allergies  Allergen Reactions  . Entresto [Sacubitril-Valsartan]     Abnormal kidney functions     Metabolic Disorder Labs: No results found for: HGBA1C, MPG No results found for: PROLACTIN Lab Results  Component Value Date   CHOL 94 (L) 08/15/2019   TRIG 93 08/15/2019   HDL 41 08/15/2019   CHOLHDL 2.3 08/15/2019   VLDL 17 05/07/2019   LDLCALC 35 08/15/2019   LDLCALC 88 05/07/2019   Lab Results  Component Value Date   TSH 1.496 09/20/2019    Therapeutic Level Labs: No results found for: LITHIUM No results found for: CBMZ No results found for: VALPROATE  Current Medications: Current Outpatient Medications  Medication Sig Dispense Refill  . acetaminophen (TYLENOL) 500 MG tablet Take 1,000 mg by mouth daily as needed for mild pain.    Marland Kitchen amiodarone (PACERONE) 200 MG tablet TAKE 1 TABLET BY MOUTH EVERY DAY (Patient taking differently: Take 200 mg by mouth daily.) 90 tablet 1  . apixaban (ELIQUIS) 5 MG TABS tablet TAKE 1 TABLET (5 MG TOTAL) BY MOUTH 2 (TWO) TIMES DAILY. (Patient taking differently: Take 5 mg by mouth 2 (two) times daily.) 60 tablet 5  . atorvastatin (LIPITOR) 40 MG tablet Take 1 tablet (40 mg  total) by mouth daily at 6 PM. (Patient taking differently: Take 40 mg by mouth daily.) 90 tablet 3  . carvedilol (COREG) 25 MG tablet Take 1 tablet (25 mg total) by mouth 2 (two) times daily. 180 tablet 3  . citalopram (CELEXA) 40 MG tablet TAKE 1 TABLET BY MOUTH EVERY DAY (Patient taking differently: Take 40 mg by mouth daily.) 90 tablet 1  . gabapentin (NEURONTIN) 300 MG capsule Take 3 capsules (900 mg total) by mouth at bedtime. (Patient taking differently: Take 300-600 mg by mouth See admin instructions. Takes 2 capsule in the morning and 1 capsule at night) 90 capsule 11  . hydrALAZINE (APRESOLINE) 50 MG tablet Take 1/2 tablet each morning and 1 & 1/2 tablets each evening. (Patient taking differently: Take 25-75 mg by mouth See admin instructions. Take 1/2 tablet each morning and 1 & 1/2 tablets  each evening.) 180 tablet 3  . metoprolol tartrate (LOPRESSOR) 100 MG tablet Take 100 mg by mouth 2 (two) times daily.     No current facility-administered medications for this visit.    Musculoskeletal: Strength & Muscle Tone: within normal limits Gait & Station: unsteady, uses a walker for ambulation Patient leans: Front  Psychiatric Specialty Exam: Review of Systems  There were no vitals taken for this visit.There is no height or weight on file to calculate BMI.  General Appearance: Fairly Groomed  Eye Contact:  Fair  Speech:  Clear and Coherent and Slow  Volume:  Decreased  Mood:  Depressed  Affect:  Congruent  Thought Process:  Goal Directed and Descriptions of Associations: Intact  Orientation:  Full (Time, Place, and Person)  Thought Content:  Logical  Suicidal Thoughts:  No  Homicidal Thoughts:  No  Memory:  Immediate;   Good Recent;   Good Remote;   Good  Judgement:  Fair  Insight:  Fair  Psychomotor Activity:  Decreased  Concentration:  Concentration: Good and Attention Span: Good  Recall:  Good  Fund of Knowledge:Good  Language: Good  Akathisia:  Negative  Handed:   Left  AIMS (if indicated):  0  Assets:  Communication Skills Desire for Improvement Financial Resources/Insurance Housing  ADL's:  Intact  Cognition: WNL  Sleep:  Poor   Screenings: PHQ2-9   Flowsheet Row Office Visit from 10/08/2019 in [facility] Office Visit from 09/10/2019 in [facility] Office Visit from 06/13/2019 in [facility] Office Visit from 05/16/2019 in [facility]  PHQ-2 Total Score 1 0 0 1    Flowsheet Row ED from 05/21/2020 in [facility] EMERGENCY DEPART[facility] ED from 05/14/2020 in [facility]ENC[facility]  C-SSRS RISK CATEGORY Error: Question 6 not populated No Risk      Assessment and Plan: Patient continues to be in chronic pain which is impacting his quality of life.  He is agreeable to adding trazodone to his regimen to help him with sleep.  1. MDD (major depressive disorder), recurrent, in partial remission (HCC)  - citalopram (CELEXA) 40 MG tablet; Take 1 tablet (40 mg total) by mouth daily.  Dispense: 90 tablet; Refill: 1 -Start traZODone (DESYREL) 50 MG tablet; Take half to whole tablet at bedtime for sleep.  Dispense: 90 tablet; Refill: 1  Continue same medication regimen. Follow up in 3 months.   Zena Amos, MD 3/23/20222:22 PM

## 2020-06-20 ENCOUNTER — Emergency Department (HOSPITAL_COMMUNITY): Payer: Medicaid Other

## 2020-06-20 ENCOUNTER — Emergency Department (HOSPITAL_COMMUNITY)
Admission: EM | Admit: 2020-06-20 | Discharge: 2020-06-21 | Disposition: A | Discharge status: Home / Self Care | Payer: Medicaid Other | Attending: Emergency Medicine | Admitting: Emergency Medicine

## 2020-06-20 ENCOUNTER — Other Ambulatory Visit: Payer: Self-pay

## 2020-06-20 DIAGNOSIS — R42 Dizziness and giddiness: Secondary | ICD-10-CM | POA: Diagnosis not present

## 2020-06-20 DIAGNOSIS — E039 Hypothyroidism, unspecified: Secondary | ICD-10-CM | POA: Diagnosis not present

## 2020-06-20 DIAGNOSIS — I4819 Other persistent atrial fibrillation: Secondary | ICD-10-CM | POA: Insufficient documentation

## 2020-06-20 DIAGNOSIS — R519 Headache, unspecified: Secondary | ICD-10-CM | POA: Insufficient documentation

## 2020-06-20 DIAGNOSIS — R202 Paresthesia of skin: Secondary | ICD-10-CM | POA: Diagnosis not present

## 2020-06-20 DIAGNOSIS — Z7901 Long term (current) use of anticoagulants: Secondary | ICD-10-CM | POA: Insufficient documentation

## 2020-06-20 DIAGNOSIS — R200 Anesthesia of skin: Secondary | ICD-10-CM

## 2020-06-20 DIAGNOSIS — M6281 Muscle weakness (generalized): Secondary | ICD-10-CM | POA: Insufficient documentation

## 2020-06-20 DIAGNOSIS — I110 Hypertensive heart disease with heart failure: Secondary | ICD-10-CM | POA: Diagnosis not present

## 2020-06-20 DIAGNOSIS — Z87891 Personal history of nicotine dependence: Secondary | ICD-10-CM | POA: Diagnosis not present

## 2020-06-20 DIAGNOSIS — R55 Syncope and collapse: Secondary | ICD-10-CM | POA: Insufficient documentation

## 2020-06-20 DIAGNOSIS — I5042 Chronic combined systolic (congestive) and diastolic (congestive) heart failure: Secondary | ICD-10-CM | POA: Insufficient documentation

## 2020-06-20 DIAGNOSIS — Z79899 Other long term (current) drug therapy: Secondary | ICD-10-CM | POA: Insufficient documentation

## 2020-06-20 DIAGNOSIS — R29898 Other symptoms and signs involving the musculoskeletal system: Secondary | ICD-10-CM

## 2020-06-20 LAB — CBC WITH DIFFERENTIAL/PLATELET
Abs Immature Granulocytes: 0.02 10*3/uL (ref 0.00–0.07)
Basophils Absolute: 0.1 10*3/uL (ref 0.0–0.1)
Basophils Relative: 1 %
Eosinophils Absolute: 0.2 10*3/uL (ref 0.0–0.5)
Eosinophils Relative: 3 %
HCT: 36.4 % — ABNORMAL LOW (ref 39.0–52.0)
Hemoglobin: 11.5 g/dL — ABNORMAL LOW (ref 13.0–17.0)
Immature Granulocytes: 0 %
Lymphocytes Relative: 16 %
Lymphs Abs: 1 10*3/uL (ref 0.7–4.0)
MCH: 28.2 pg (ref 26.0–34.0)
MCHC: 31.6 g/dL (ref 30.0–36.0)
MCV: 89.2 fL (ref 80.0–100.0)
Monocytes Absolute: 0.5 10*3/uL (ref 0.1–1.0)
Monocytes Relative: 9 %
Neutro Abs: 4.4 10*3/uL (ref 1.7–7.7)
Neutrophils Relative %: 71 %
Platelets: 220 10*3/uL (ref 150–400)
RBC: 4.08 MIL/uL — ABNORMAL LOW (ref 4.22–5.81)
RDW: 15.7 % — ABNORMAL HIGH (ref 11.5–15.5)
WBC: 6.1 10*3/uL (ref 4.0–10.5)
nRBC: 0 % (ref 0.0–0.2)

## 2020-06-20 LAB — COMPREHENSIVE METABOLIC PANEL
ALT: 27 U/L (ref 0–44)
AST: 26 U/L (ref 15–41)
Albumin: 4 g/dL (ref 3.5–5.0)
Alkaline Phosphatase: 58 U/L (ref 38–126)
Anion gap: 8 (ref 5–15)
BUN: 13 mg/dL (ref 6–20)
CO2: 22 mmol/L (ref 22–32)
Calcium: 9 mg/dL (ref 8.9–10.3)
Chloride: 106 mmol/L (ref 98–111)
Creatinine, Ser: 1.3 mg/dL — ABNORMAL HIGH (ref 0.61–1.24)
GFR, Estimated: >60 mL/min (ref >60)
Glucose, Bld: 86 mg/dL (ref 70–99)
Potassium: 4.7 mmol/L (ref 3.5–5.1)
Sodium: 136 mmol/L (ref 135–145)
Total Bilirubin: 0.8 mg/dL (ref 0.3–1.2)
Total Protein: 7 g/dL (ref 6.5–8.1)

## 2020-06-20 LAB — TSH: TSH: 6.637 u[IU]/mL — ABNORMAL HIGH (ref 0.350–4.500)

## 2020-06-20 LAB — TROPONIN I (HIGH SENSITIVITY)
Troponin I (High Sensitivity): 5 ng/L (ref <18)
Troponin I (High Sensitivity): 5 ng/L (ref <18)

## 2020-06-20 LAB — BRAIN NATRIURETIC PEPTIDE: B Natriuretic Peptide: 42.8 pg/mL (ref 0.0–100.0)

## 2020-06-20 LAB — LACTIC ACID, PLASMA: Lactic Acid, Venous: 1.3 mmol/L (ref 0.5–1.9)

## 2020-06-20 MED ORDER — IOHEXOL 350 MG/ML SOLN
75.0000 mL | Freq: Once | INTRAVENOUS | Status: AC | PRN
  Administered 2020-06-20: 75 mL via INTRAVENOUS

## 2020-06-20 MED ORDER — PROCHLORPERAZINE EDISYLATE 10 MG/2ML IJ SOLN
10.0000 mg | Freq: Once | INTRAMUSCULAR | Status: AC
  Administered 2020-06-20: 10 mg via INTRAVENOUS
  Filled 2020-06-20: qty 2

## 2020-06-20 MED ORDER — DIPHENHYDRAMINE HCL 50 MG/ML IJ SOLN
25.0000 mg | Freq: Once | INTRAMUSCULAR | Status: AC
  Administered 2020-06-20: 25 mg via INTRAVENOUS
  Filled 2020-06-20: qty 1

## 2020-06-20 MED ORDER — SODIUM CHLORIDE 0.9 % IV BOLUS
500.0000 mL | Freq: Once | INTRAVENOUS | Status: AC
  Administered 2020-06-20: 500 mL via INTRAVENOUS

## 2020-06-20 NOTE — ED Provider Notes (Signed)
[facility] EMERGENCY DEPARTMENT Provider Note   CSN: [id number] Arrival date & time: 06/20/20  1608     History Chief Complaint  Patient presents with  . Dizziness  . Hypotension    Seth Snyder is a 60 y.o. male.  The history is provided by the patient and medical records. No language interpreter was used.  Near Syncope This is a new problem. Episode onset: unable to specify. The problem occurs constantly. Associated symptoms include headaches. Pertinent negatives include no chest pain, no abdominal pain and no shortness of breath. Nothing aggravates the symptoms. Nothing relieves the symptoms. He has tried nothing for the symptoms. The treatment provided no relief.       Past Medical History:  Diagnosis Date  . Anemia   . Atrial fibrillation (HCC)   . Hypertension   . Low back pain     Patient Active Problem List   Diagnosis Date Noted  . MDD (major depressive disorder), recurrent episode, moderate (HCC) 04/15/2020  . Persistent atrial fibrillation (HCC) 09/20/2019  . Secondary hypercoagulable state (HCC) 09/20/2019  . Abnormal nuclear stress test   . Spinal stenosis of cervical region 08/13/2019  . Protrusion of cervical intervertebral disc 08/13/2019  . Shoulder pain   . Chronic combined systolic and diastolic heart failure (HCC)   . Atrial fibrillation with RVR (HCC) 05/07/2019  . Hypertension 05/07/2019  . Hypothyroidism 05/07/2019  . Hypomagnesemia 05/07/2019  . Elevated troponin 05/07/2019    Past Surgical History:  Procedure Laterality Date  . CARDIOVERSION N/A 05/10/2019   Procedure: CARDIOVERSION;  Surgeon: Lewayne Bunting, MD;  Location: [facility] ENDOSCOPY;  Service: Cardiovascular;  Laterality: N/A;  . COLONOSCOPY  07/12/2013  . LEFT HEART CATH AND CORONARY ANGIOGRAPHY N/A 08/30/2019   Procedure: LEFT HEART CATH AND CORONARY ANGIOGRAPHY;  Surgeon: Corky Crafts, MD;  Location: [facility] INVASIVE CV LAB;  Service: Cardiovascular;   Laterality: N/A;  . TEE WITHOUT CARDIOVERSION N/A 05/10/2019   Procedure: TRANSESOPHAGEAL ECHOCARDIOGRAM (TEE);  Surgeon: Lewayne Bunting, MD;  Location: [facility] ENDOSCOPY;  Service: Cardiovascular;  Laterality: N/A;       Family History  Problem Relation Age of Onset  . Diabetes Mother   . Diabetes Sister   . Mental illness Sister   . Mental illness Brother     Social History   Tobacco Use  . Smoking status: Former Smoker    Quit date: 12/21/2018    Years since quitting: 1.4  . Smokeless tobacco: Never Used  Vaping Use  . Vaping Use: Never used  Substance Use Topics  . Alcohol use: Not Currently  . Drug use: Never    Home Medications Prior to Admission medications   Medication Sig Start Date End Date Taking? Authorizing Provider  acetaminophen (TYLENOL) 500 MG tablet Take 1,000 mg by mouth daily as needed for mild pain.    [provider]  amiodarone (PACERONE) 200 MG tablet TAKE 1 TABLET BY MOUTH EVERY DAY Patient taking differently: Take 200 mg by mouth daily. 03/31/20   Chilton Si, MD  apixaban (ELIQUIS) 5 MG TABS tablet TAKE 1 TABLET (5 MG TOTAL) BY MOUTH 2 (TWO) TIMES DAILY. Patient taking differently: Take 5 mg by mouth 2 (two) times daily. 12/21/19   Chilton Si, MD  atorvastatin (LIPITOR) 40 MG tablet Take 1 tablet (40 mg total) by mouth daily at 6 PM. Patient taking differently: Take 40 mg by mouth daily. 08/27/19   Chilton Si, MD  carvedilol (COREG) 25 MG tablet Take  1 tablet (25 mg total) by mouth 2 (two) times daily. 03/11/20 06/09/20  Chilton Siandolph, Tiffany, MD  citalopram (CELEXA) 40 MG tablet Take 1 tablet (40 mg total) by mouth daily. 06/11/20   Zena AmosKaur, Mandeep, MD  gabapentin (NEURONTIN) 300 MG capsule Take 3 capsules (900 mg total) by mouth at bedtime. Patient taking differently: Take 300-600 mg by mouth See admin instructions. Takes 2 capsule in the morning and 1 capsule at night 03/31/20 03/31/21  Barbette MerinoKing, [name] M, NP  hydrALAZINE (APRESOLINE)  50 MG tablet Take 1/2 tablet each morning and 1 & 1/2 tablets each evening. Patient taking differently: Take 25-75 mg by mouth See admin instructions. Take 1/2 tablet each morning and 1 & 1/2 tablets each evening. 05/08/20   Chilton Siandolph, Tiffany, MD  metoprolol tartrate (LOPRESSOR) 100 MG tablet Take 100 mg by mouth 2 (two) times daily.    [provider]  traZODone (DESYREL) 50 MG tablet Take half to whole tablet at bedtime for sleep. 06/11/20   Zena AmosKaur, Mandeep, MD    Allergies    Sherryll BurgerEntresto [sacubitril-valsartan]  Review of Systems   Review of Systems  Constitutional: Positive for fatigue. Negative for chills, diaphoresis and fever.  HENT: Negative for congestion.   Eyes: Positive for visual disturbance. Negative for photophobia.  Respiratory: Negative for cough, chest tightness, shortness of breath and wheezing.   Cardiovascular: Positive for near-syncope. Negative for chest pain.  Gastrointestinal: Negative for abdominal pain, constipation, diarrhea, nausea and vomiting.  Genitourinary: Negative for dysuria and flank pain.  Musculoskeletal: Positive for neck pain. Negative for back pain and neck stiffness.  Neurological: Positive for dizziness, weakness, light-headedness, numbness and headaches. Negative for seizures, syncope and speech difficulty.  Psychiatric/Behavioral: Negative for agitation and confusion.  All other systems reviewed and are negative.   Physical Exam Updated Vital Signs BP 127/77 (BP Location: Right Arm)   Pulse (!) 59   Temp 98 F (36.7 C) (Oral)   Resp 16   Ht 5\' 7"  (1.702 m)   SpO2 96%   BMI 24.15 kg/m   Physical Exam Vitals and nursing note reviewed.  Constitutional:      General: He is not in acute distress.    Appearance: He is well-developed. He is not ill-appearing, toxic-appearing or diaphoretic.  HENT:     Head: Normocephalic and atraumatic.     Nose: No congestion or rhinorrhea.     Mouth/Throat:     Mouth: Mucous membranes are dry.      Pharynx: No oropharyngeal exudate or posterior oropharyngeal erythema.  Eyes:     Extraocular Movements: Extraocular movements intact.     Conjunctiva/sclera: Conjunctivae normal.     Pupils: Pupils are equal, round, and reactive to light.  Cardiovascular:     Rate and Rhythm: Normal rate and regular rhythm.     Pulses: Normal pulses.     Heart sounds: No murmur heard.   Pulmonary:     Effort: Pulmonary effort is normal. No respiratory distress.     Breath sounds: Normal breath sounds.  Abdominal:     Palpations: Abdomen is soft.     Tenderness: There is no abdominal tenderness. There is no right CVA tenderness, left CVA tenderness, guarding or rebound.  Musculoskeletal:        General: No tenderness.     Cervical back: Neck supple. No tenderness.  Skin:    General: Skin is warm and dry.     Capillary Refill: Capillary refill takes less than 2 seconds.  Findings: No bruising or erythema.  Neurological:     Mental Status: He is alert.     Sensory: Sensory deficit present.     Motor: Weakness present.     Coordination: Coordination normal.     Comments: Somnolent  Psychiatric:        Mood and Affect: Mood normal.     ED Results / Procedures / Treatments   Labs (all labs ordered are listed, but only abnormal results are displayed) Labs Reviewed  CBC WITH DIFFERENTIAL/PLATELET - Abnormal; Notable for the following components:      Result Value   RBC 4.08 (*)    Hemoglobin 11.5 (*)    HCT 36.4 (*)    RDW 15.7 (*)    All other components within normal limits  COMPREHENSIVE METABOLIC PANEL - Abnormal; Notable for the following components:   Creatinine, Aijalon 1.30 (*)    All other components within normal limits  TSH - Abnormal; Notable for the following components:   TSH 6.637 (*)    All other components within normal limits  URINE CULTURE  BRAIN NATRIURETIC PEPTIDE  LACTIC ACID, PLASMA  RAPID URINE DRUG SCREEN, HOSP PERFORMED  URINALYSIS, ROUTINE W REFLEX  MICROSCOPIC  TROPONIN I (HIGH SENSITIVITY)  TROPONIN I (HIGH SENSITIVITY)    EKG EKG Interpretation  Date/Time:  Friday June 20 2020 16:09:07 EDT Ventricular Rate:  61 PR Interval:  182 QRS Duration: 103 QT Interval:  483 QTC Calculation: 487 R Axis:   77 Text Interpretation: Sinus rhythm Left ventricular hypertrophy Borderline prolonged QT interval When compared to prior, similar appearance. NO STEMI Confirmed by Theda Belfast ([id number]) on 06/20/2020 4:47:59 PM   Radiology CT Angio Head W or Wo Contrast  Result Date: 06/20/2020 CLINICAL DATA:  Headache and neck pain with blurry vision and dizziness EXAM: CT ANGIOGRAPHY HEAD AND NECK TECHNIQUE: Multidetector CT imaging of the head and neck was performed using the standard protocol during bolus administration of intravenous contrast. Multiplanar CT image reconstructions and MIPs were obtained to evaluate the vascular anatomy. Carotid stenosis measurements (when applicable) are obtained utilizing NASCET criteria, using the distal internal carotid diameter as the denominator. CONTRAST:  39mL OMNIPAQUE IOHEXOL 350 MG/ML SOLN COMPARISON:  None. FINDINGS: CT HEAD FINDINGS Brain: There is no mass, hemorrhage or extra-axial collection. The size and configuration of the ventricles and extra-axial CSF spaces are normal. There is no acute or chronic infarction. The brain parenchyma is normal. Skull: The visualized skull base, calvarium and extracranial soft tissues are normal. Sinuses/Orbits: No fluid levels or advanced mucosal thickening of the visualized paranasal sinuses. No mastoid or middle ear effusion. The orbits are normal. CTA NECK FINDINGS SKELETON: There is no bony spinal canal stenosis. No lytic or blastic lesion. OTHER NECK: Normal pharynx, larynx and major salivary glands. No cervical lymphadenopathy. Unremarkable thyroid gland. UPPER CHEST: No pneumothorax or pleural effusion. No nodules or masses. AORTIC ARCH: There is no calcific  atherosclerosis of the aortic arch. There is no aneurysm, dissection or hemodynamically significant stenosis of the visualized portion of the aorta. Conventional 3 vessel aortic branching pattern. The visualized proximal subclavian arteries are widely patent. RIGHT CAROTID SYSTEM: Normal without aneurysm, dissection or stenosis. LEFT CAROTID SYSTEM: Normal without aneurysm, dissection or stenosis. VERTEBRAL ARTERIES: Left dominant configuration. Both origins are clearly patent. There is no dissection, occlusion or flow-limiting stenosis to the skull base (V1-V3 segments). CTA HEAD FINDINGS POSTERIOR CIRCULATION: --Vertebral arteries: Non dominant right vertebral artery terminates in PICA. --Inferior cerebellar arteries: Normal. --Basilar artery:  Normal. --Superior cerebellar arteries: Normal. --Posterior cerebral arteries (PCA): Normal. ANTERIOR CIRCULATION: --Intracranial internal carotid arteries: Normal. --Anterior cerebral arteries (ACA): Normal. Both A1 segments are present. Patent anterior communicating artery (a-comm). --Middle cerebral arteries (MCA): Normal. VENOUS SINUSES: As permitted by contrast timing, patent. ANATOMIC VARIANTS: None Review of the MIP images confirms the above findings. IMPRESSION: Normal CTA of the head and neck.  No vertebral artery dissection. Electronically Signed   By: Deatra Robinson M.D.   On: 06/20/2020 20:45   CT Angio Neck W and/or Wo Contrast  Result Date: 06/20/2020 CLINICAL DATA:  Headache and neck pain with blurry vision and dizziness EXAM: CT ANGIOGRAPHY HEAD AND NECK TECHNIQUE: Multidetector CT imaging of the head and neck was performed using the standard protocol during bolus administration of intravenous contrast. Multiplanar CT image reconstructions and MIPs were obtained to evaluate the vascular anatomy. Carotid stenosis measurements (when applicable) are obtained utilizing NASCET criteria, using the distal internal carotid diameter as the denominator. CONTRAST:   62mL OMNIPAQUE IOHEXOL 350 MG/ML SOLN COMPARISON:  None. FINDINGS: CT HEAD FINDINGS Brain: There is no mass, hemorrhage or extra-axial collection. The size and configuration of the ventricles and extra-axial CSF spaces are normal. There is no acute or chronic infarction. The brain parenchyma is normal. Skull: The visualized skull base, calvarium and extracranial soft tissues are normal. Sinuses/Orbits: No fluid levels or advanced mucosal thickening of the visualized paranasal sinuses. No mastoid or middle ear effusion. The orbits are normal. CTA NECK FINDINGS SKELETON: There is no bony spinal canal stenosis. No lytic or blastic lesion. OTHER NECK: Normal pharynx, larynx and major salivary glands. No cervical lymphadenopathy. Unremarkable thyroid gland. UPPER CHEST: No pneumothorax or pleural effusion. No nodules or masses. AORTIC ARCH: There is no calcific atherosclerosis of the aortic arch. There is no aneurysm, dissection or hemodynamically significant stenosis of the visualized portion of the aorta. Conventional 3 vessel aortic branching pattern. The visualized proximal subclavian arteries are widely patent. RIGHT CAROTID SYSTEM: Normal without aneurysm, dissection or stenosis. LEFT CAROTID SYSTEM: Normal without aneurysm, dissection or stenosis. VERTEBRAL ARTERIES: Left dominant configuration. Both origins are clearly patent. There is no dissection, occlusion or flow-limiting stenosis to the skull base (V1-V3 segments). CTA HEAD FINDINGS POSTERIOR CIRCULATION: --Vertebral arteries: Non dominant right vertebral artery terminates in PICA. --Inferior cerebellar arteries: Normal. --Basilar artery: Normal. --Superior cerebellar arteries: Normal. --Posterior cerebral arteries (PCA): Normal. ANTERIOR CIRCULATION: --Intracranial internal carotid arteries: Normal. --Anterior cerebral arteries (ACA): Normal. Both A1 segments are present. Patent anterior communicating artery (a-comm). --Middle cerebral arteries (MCA):  Normal. VENOUS SINUSES: As permitted by contrast timing, patent. ANATOMIC VARIANTS: None Review of the MIP images confirms the above findings. IMPRESSION: Normal CTA of the head and neck.  No vertebral artery dissection. Electronically Signed   By: Deatra Robinson M.D.   On: 06/20/2020 20:45   DG Chest Portable 1 View  Result Date: 06/20/2020 CLINICAL DATA:  60 year old male with hypertension. EXAM: PORTABLE CHEST 1 VIEW COMPARISON:  Chest radiograph dated 05/21/2020. FINDINGS: No focal consolidation, pleural effusion, pneumothorax. The cardiac silhouette is within limits. No acute osseous pathology. IMPRESSION: No active disease. Electronically Signed   By: Elgie Collard M.D.   On: 06/20/2020 17:47    Procedures Procedures   Medications Ordered in ED Medications  iohexol (OMNIPAQUE) 350 MG/ML injection 75 mL (75 mLs Intravenous Contrast Given 06/20/20 2030)  prochlorperazine (COMPAZINE) injection 10 mg (10 mg Intravenous Given 06/20/20 2252)  diphenhydrAMINE (BENADRYL) injection 25 mg (25 mg Intravenous Given 06/20/20 2253)  sodium chloride 0.9 % bolus 500 mL (0 mLs Intravenous Stopped 06/20/20 2351)    ED Course  I have reviewed the triage vital signs and the nursing notes.  Pertinent labs & imaging results that were available during my care of the patient were reviewed by me and considered in my medical decision making (see chart for details).    MDM Rules/Calculators/A&P                          Seth Snyder is a 60 y.o. male with a past medical history significant for atrial fibrillation on Eliquis therapy, hypertension, anemia, depression, and CHF who presents with several complaints including worsening headache, neck pain, transient blurry vision, right arm and right leg numbness and weakness, lightheadedness, dizziness, and hypotension with EMS.  According to patient through a [place] interpreter, he is brought in today because EMS found him to be bradycardic and hypotensive feeling  very lightheaded like he was going to pass out.  He denies actually having a syncopal episode but he was given fluids in route and blood pressure has now improved.  He says that this has been going on for a "long time" and has a very difficult time telling me what any of his symptoms began.  He describes that his blood pressure has been very high and occasionally low over the last few weeks and a visit several weeks ago found his blood pressure to be in the 200s.  Patient has been seen by cardiology try to get blood pressure under better control.  He reports that he has been having worsened pain in his neck going into his head that seems to be associated with dizziness, lightheadedness, headache, blurry vision, and he is describing these episodes of numbness and weakness in his right arm and right leg.  He says that they have been going on for also "a long time" and cannot give me a hard start time despite prolonged investigation with conversation.  Currently he is denying any chest pain or palpitations.  He is denying nausea, vomiting, constipation, or diarrhea.  Denies any urinary changes.  Denies any new trauma.  On exam, lungs are clear and chest is nontender.  Abdomen is nontender.  Patient has subjective numbness in right arm and right leg but has normal sensation of the face.  Symmetric smile.  He has slightly decreased strength in the right leg compared to the left but had symmetric grip strength for me.  Normal finger-nose-finger testing.  No carotid bruit appreciated.  Neck was nontender and back was nontender.  No rashes seen.  Pupils are symmetric reactive normal extraocular movements.  Patient is somnolent even with [place] interpreter.  Patient had no tenderness of the neck but he describes the pain going from his neck into his head.  EKG shows sinus rhythm.  No STEMI.  Clinically with his waxing and waning blood pressures, this new pain in his neck going into his head, and these neurologic  complaints, we will get CTA imaging of his head and  neck to rule out dissection or intracranial abnormality.  Will give him some more fluids for the hypotension and near syncope today.  We will get some other screening labs given the fatigue, somnolence, and hypotension.  We will look for occult infections otherwise.  Given his lack of fever, lower suspicion for meningitis at this time.  Anticipate reassessment after work-up to determine disposition.   Work-up began to return.  CT of the head and neck did not show aneurysm or dissection or acute stroke.  Laboratory testing began to return and was overall reassuring and similar to prior.  Troponin negative.  Chest x-ray unremarkable no acute disease.  I touch base with neurology who recommended MRI brain to rule out stroke given the persistent right-sided numbness and weakness in his arm and leg.  We discussed that given his relative hypotension compared to his baseline previously, patient may have had hypoperfusion causing some of his neurologic symptoms.  We will give him a headache cocktail given his persistent headache as complicated migraine could be contributing as well.  Anticipate reassessment after MRI to determine disposition but if he is feeling better and no concerning findings are discovered, anticipate discharge home.    Final Clinical Impression(s) / ED Diagnoses Final diagnoses:  Lightheadedness  Near syncope  Numbness and tingling of right arm and leg  Right arm weakness  Right leg weakness  Intractable headache, unspecified chronicity pattern, unspecified headache type     Clinical Impression: 1. Lightheadedness   2. Near syncope   3. Numbness and tingling of right arm and leg   4. Right arm weakness   5. Right leg weakness   6. Intractable headache, unspecified chronicity pattern, unspecified headache type     Disposition: Care transferred to oncoming team awaiting for reassessment after MRI and headache  cocktail.  This note was prepared with assistance of Conservation officer, historic buildings. Occasional wrong-word or sound-a-like substitutions may have occurred due to the inherent limitations of voice recognition software.     Clarabell Matsuoka, Canary Brim, MD 06/20/20 2351

## 2020-06-20 NOTE — ED Notes (Signed)
Patient to MRI via transport.

## 2020-06-20 NOTE — ED Triage Notes (Signed)
Patient presents to the ED via [facility] and reports hypotension and bradycardia.  EMS states that initial blood pressure was 90/58 with heart rate between 58-60.  EMS gave a fluid bolus and states that BP was 12820. Patient reports of being dizzy and states this has been going on for a long time.

## 2020-06-21 ENCOUNTER — Emergency Department (HOSPITAL_COMMUNITY): Payer: Medicaid Other

## 2020-06-21 MED ORDER — HYDRALAZINE HCL 20 MG/ML IJ SOLN: 5.0000 mg | Freq: Once | INTRAMUSCULAR | Status: DC

## 2020-06-21 NOTE — ED Notes (Addendum)
Ambulatory top bathroom @ this time. Denies dizziness. Even and steady gait.

## 2020-06-21 NOTE — ED Notes (Signed)
Ambulatory down hall. States slight dizziness. Pt resting in bed @ this time w/ NAD noted. VSS. Placed on Cardiac monitoring in place w/ bp cycling and spo2 being monnitored. Updated on plan of care. Deny needs or concerns @ this time. Bed low and locked.

## 2020-06-21 NOTE — Discharge Instructions (Signed)
Your testing shows no evidence of stroke.  Follow-up with your doctor for management of your blood pressure. Return to the ED with new or worsening symptoms

## 2020-06-21 NOTE — ED Notes (Signed)
ED Provider at bedside. 

## 2020-06-21 NOTE — ED Provider Notes (Signed)
Care assumed from Dr. Rush Landmark.   Patient with atrial fibrillation on Eliquis, hypertension, CHF here with right-sided numbness, dizziness, headache and neck pain.  Unknown last seen normal.  CTA was negative.  MRI is pending.  MRI shows no acute stroke.  Patient is able to ambulate without dizziness or lightheadedness.  States his headache is improved. Blood pressure remains elevated Dose of hydralazine was given.  Has improved without hydralazine and this was held.  He is able to ambulate.  Advised to keep a close eye on his blood pressure and follow-up with his specialist. Return precautions discussed  BP (!) 154/73 (BP Location: Right Arm)   Pulse 80   Temp 98.4 F (36.9 C) (Oral)   Resp 14   Ht 5\' 7"  (1.702 m)   SpO2 98%   BMI 24.15 kg/m     , MD 06/21/20 [phone]

## 2020-06-21 NOTE — ED Notes (Signed)
Follow up appts reviewed w/ pt. Denies questions or concerns @ this time. Pt educated on tx of hypertension. Pt BP stable @ this time Education on s/s of worsening and when to return. Evaluated for dizziness states relief after fluids. Workup reassuring states relief @ this time. Left w/ even and steady gait. NAD noted. PIV removed and VSS.

## 2020-06-24 ENCOUNTER — Other Ambulatory Visit: Payer: Self-pay | Admitting: Cardiovascular Disease

## 2020-06-24 ENCOUNTER — Ambulatory Visit (INDEPENDENT_AMBULATORY_CARE_PROVIDER_SITE_OTHER): Payer: Medicaid Other | Admitting: Cardiovascular Disease

## 2020-06-24 ENCOUNTER — Encounter: Payer: Self-pay | Admitting: Cardiovascular Disease

## 2020-06-24 ENCOUNTER — Other Ambulatory Visit: Payer: Self-pay | Admitting: General Practice

## 2020-06-24 ENCOUNTER — Other Ambulatory Visit: Payer: Self-pay

## 2020-06-24 VITALS — BP 98/57 | HR 60 | Ht 67.0 in | Wt 155.8 lb

## 2020-06-24 DIAGNOSIS — I10 Essential (primary) hypertension: Secondary | ICD-10-CM

## 2020-06-24 DIAGNOSIS — I4819 Other persistent atrial fibrillation: Secondary | ICD-10-CM | POA: Diagnosis not present

## 2020-06-24 DIAGNOSIS — I5042 Chronic combined systolic (congestive) and diastolic (congestive) heart failure: Secondary | ICD-10-CM | POA: Diagnosis not present

## 2020-06-24 DIAGNOSIS — E039 Hypothyroidism, unspecified: Secondary | ICD-10-CM

## 2020-06-24 MED ORDER — AMLODIPINE BESYLATE 2.5 MG PO TABS: 2.5000 mg | ORAL_TABLET | Freq: Every day | ORAL | 3 refills | Status: DC

## 2020-06-24 NOTE — Patient Instructions (Addendum)
Medication Instructions:  STOP HYDRALAZINE   OK TO USE HYDRALAZINE 50 MG 1/2 TABLET AS NEEDED FOR BLOOD PRESSURE ABOVE 150  START AMLODIPINE 2.5 MG DAILY   *If you need a refill on your cardiac medications before your next appointment, please call your pharmacy*  Lab Work: TSH/FT4/FT3/METANEPHRINES/CATCHOLAMINES TODAY   If you have labs (blood work) drawn today and your tests are completely normal, you will receive your results only by: Marland Kitchen MyChart Message (if you have MyChart) OR . A paper copy in the mail If you have any lab test that is abnormal or we need to change your treatment, we will call you to review the results.  Testing/Procedures: NONE  Follow-Up: At [facility], you and your health needs are our priority.  As part of our continuing mission to provide you with exceptional heart care, we have created designated Provider Care Teams.  These Care Teams include your primary Cardiologist (physician) and Advanced Practice Providers (APPs -  Physician Assistants and Nurse Practitioners) who all work together to provide you with the care you need, when you need it.  We recommend signing up for the patient portal called "MyChart".  Sign up information is provided on this After Visit Summary.  MyChart is used to connect with patients for Virtual Visits (Telemedicine).  Patients are able to view lab/test results, encounter notes, upcoming appointments, etc.  Non-urgent messages can be sent to your provider as well.   To learn more about what you can do with MyChart, go to [email or web address].    Your next appointment:   3-4 MONTHS   The format for your next appointment:   In Person  Provider:   DR [facility] AT [facility] LOCATION   Your physician recommends that you schedule a follow-up appointment in: 1 MONTH WITH JESSE   WILL HAVE SOCIAL WORKER ISABEL REACH OUT TO YOU

## 2020-06-24 NOTE — Telephone Encounter (Signed)
48m, 69.9kg, scr 1.3 06/20/20, lovw/cleaver 05/23/20

## 2020-06-24 NOTE — Assessment & Plan Note (Addendum)
BP is very labile.  We will stop hydralazine and start amlodipine 2.5mg  daily.  He can take hydralazine25 mg as needed for SBP >150.  Continue carvedilol.  Repeat TSH and add T3 and T3-T4 for hypothyroidism.  Check serum catecholamines and metanephrines to rule out pheochromocytoma.

## 2020-06-24 NOTE — Assessment & Plan Note (Addendum)
Systolic function has recovered EF 60 to 65% on his most recent echo.  It was 25% at the time of his cath.  Likely hypertensive cardiomyopathy.  Continue carvedilol.  He did not tolerate ARB or Entresto.  Blood pressure management as above.

## 2020-06-24 NOTE — Progress Notes (Signed)
Cardiology Office Note   Date:  06/24/2020   ID:  Seth Jeremaine, Snyder [birth year], MRN [id number]  PCP:  Barbette Merino, NP  Cardiologist:   Chilton Si, MD   No chief complaint on file.    History of Present Illness: Seth Snyder is a 60 y.o. male with atrial fibrillation, hypertension, and chronic systolic diastolic heart failure who presents for follow-up.  He was initially diagnosed with atrial fibrillation 01/2020 [facility].  He presented with hypertension and palpitations.  He had an echo 05/2019 that revealed LVEF 45 to 50% with global hypokinesis.  Right atrial pressure was 8 mmHg.  He underwent stress test 05/2019 which was suggestive of apical ischemia.  He subsequently had a left heart cath 06/2019 that revealed normal coronaries.  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  He underwent TEE/cardioversion on 04/2019 but went back into atrial fibrillation.  He was started on amiodarone.  He is been seen by Dr. Johney Frame, most recently 10/2019.  They discussed ablation in the future.  However this was deferred as he may need surgical intervention on his cervical radiculopathy.  He was in sinus rhythm at that appointment.  He stopped Entresto and spironolactone due to worsening renal function. At his last appointment, his bp was extremely elevated, metoprolol was switched to carvedilol and hydralazine was added. He was seen in the ED 07/10/19 with dizziness and hypotension. Hydralazine was discontinued.   His at home bps ranged from /  He is not doing well at this visit. He has concerns about his blood pressure fluctuating, it is usually higher in the mornings and lower in the afternoons. He states his blood pressure also changes suddenly at times. When his blood pressure is high, he has dizziness. When his blood pressure is low, he has SOB, palpitations, and burning in his chest. He has occasion sharp pain in his feet(R>L). He has been taking Carvedilol, Hydralazine,  Tylenol, and Eliquis in the morning, and in the evenings. He fell on 06/20/2020 and was seen in the ED. His blood pressure was high at that time as well. He has no LE edema or PND. He does not salt his foods. He does intake tea or coffee. He eat mostly fruits and vegetables. He is unaware of the supplement that his daughter gives him daily but he plans to bring this on the next visit. He needs to have neck and shoulder procedure completed soon, but his flucuating blood pressure is impeding this process.    Past Medical History:  Diagnosis Date  . Anemia   . Atrial fibrillation (HCC)   . Hypertension   . Low back pain     Past Surgical History:  Procedure Laterality Date  . CARDIOVERSION N/A 05/10/2019   Procedure: CARDIOVERSION;  Surgeon: Lewayne Bunting, MD;  Location: [facility] ENDOSCOPY;  Service: Cardiovascular;  Laterality: N/A;  . COLONOSCOPY  07/12/2013  . LEFT HEART CATH AND CORONARY ANGIOGRAPHY N/A 08/30/2019   Procedure: LEFT HEART CATH AND CORONARY ANGIOGRAPHY;  Surgeon: Corky Crafts, MD;  Location: [facility] INVASIVE CV LAB;  Service: Cardiovascular;  Laterality: N/A;  . TEE WITHOUT CARDIOVERSION N/A 05/10/2019   Procedure: TRANSESOPHAGEAL ECHOCARDIOGRAM (TEE);  Surgeon: Lewayne Bunting, MD;  Location: [facility] ENDOSCOPY;  Service: Cardiovascular;  Laterality: N/A;     Current Outpatient Medications  Medication Sig Dispense Refill  . acetaminophen (TYLENOL) 500 MG tablet Take 1,000 mg by mouth daily as needed for mild pain.    Marland Kitchen amiodarone (  PACERONE) 200 MG tablet TAKE 1 TABLET BY MOUTH EVERY DAY (Patient taking differently: Take 200 mg by mouth daily.) 90 tablet 1  . amLODipine (NORVASC) 2.5 MG tablet Take 1 tablet (2.5 mg total) by mouth daily. 90 tablet 3  . atorvastatin (LIPITOR) 40 MG tablet TAKE 1 TABLET (40 MG TOTAL) BY MOUTH DAILY AT 6 PM. 90 tablet 3  . citalopram (CELEXA) 40 MG tablet Take 1 tablet (40 mg total) by mouth daily. 90 tablet 1  . ELIQUIS 5 MG TABS tablet TAKE 1  TABLET BY MOUTH TWICE A DAY 180 tablet 1  . gabapentin (NEURONTIN) 300 MG capsule Take 3 capsules (900 mg total) by mouth at bedtime. (Patient taking differently: Take 300-600 mg by mouth See admin instructions. Takes 2 capsule in the morning and 1 capsule at night) 90 capsule 11  . hydrALAZINE (APRESOLINE) 50 MG tablet Take 50 mg by mouth as directed. TAKE 1/2 TABLET AS NEEDED FOR BLOOD PRESSURE ABOVE 150    . traZODone (DESYREL) 50 MG tablet Take half to whole tablet at bedtime for sleep. 90 tablet 1  . carvedilol (COREG) 25 MG tablet Take 1 tablet (25 mg total) by mouth 2 (two) times daily. 180 tablet 3   No current facility-administered medications for this visit.    Allergies:   Entresto [sacubitril-valsartan]    Social History:  The patient  reports that he quit smoking about 18 months ago. He has never used smokeless tobacco. He reports previous alcohol use. He reports that he does not use drugs.   Family History:  The patient's family history includes Diabetes in his mother and sister; Mental illness in his brother and sister.    ROS:  Please see the history of present illness.   Otherwise, review of systems are positive for none.   All other systems are reviewed and negative.    PHYSICAL EXAM: VS:  BP (!) 98/57   Pulse 60   Ht 5\' 7"  (1.702 m)   Wt 155 lb 12.8 oz (70.7 kg)   SpO2 97%   BMI 24.40 kg/m  , BMI Body mass index is 24.4 kg/m. GENERAL:  Clearly in pain HEENT:  Pupils equal round and reactive, fundi not visualized, oral mucosa unremarkable NECK:  No jugular venous distention, waveform within normal limits, carotid upstroke brisk and symmetric, no bruits LUNGS:  Clear to auscultation bilaterally HEART:  RRR.  PMI not displaced or sustained,S1 and S2 within normal limits, no S3, no S4, no clicks, no rubs, no murmurs ABD:  Flat, positive bowel sounds normal in frequency in pitch, no bruits, no rebound, no guarding, no midline pulsatile mass, no hepatomegaly, no  splenomegaly EXT:  2 plus pulses throughout, no edema, no cyanosis no clubbing SKIN:  No rashes no nodules NEURO:  Cranial nerves II through XII grossly intact, motor grossly intact throughout PSYCH:  Cognitively intact, oriented to person place and time  EKG:   06/24/2020: EKG is not ordered today.  LHC 08/2019:  There is severe left ventricular systolic dysfunction.  The left ventricular ejection fraction is less than 25% by visual estimate.  LV end diastolic pressure is normal. LVEDP 12 mm Hg.  There is no aortic valve stenosis.  No significant CAD.  Severe vasospasm of right radial artery, even with 4 Fr catheter. Would not use the right radial artery if cath was needed in the future.   Medical therapy for nonischemic cardiomyopathy.   Resume Eliquis tomorrow morning.   Lexiscan Myoview 05/2019:  There was no ST segment deviation noted during stress.  Study was not gated due to atrial fibrillation  Defect 1: There is a small fixed defect of mild severity present in the apex location.  Defect 2: There is a small reversible defect of mild severity present in the basal inferior, mid inferior and apical inferior location.  Findings consistent with ischemia and prior myocardial infarction.  This is a low risk study given small area of ischemia.   Fixed defect at apex, suggests infarct.  Reversible inferior defect, suggests ischemia.  Echo 04/2019: 1. Left ventricular ejection fraction, by estimation, is 45 to 50%. The  left ventricle has mildly decreased function. The left ventricle  demonstrates global hypokinesis. Left ventricular diastolic function could  not be evaluated.  2. Right ventricular systolic function is low normal. The right  ventricular size is normal. There is mildly elevated pulmonary artery  systolic pressure.  3. The mitral valve is grossly normal. Mild mitral valve regurgitation.  4. Tricuspid valve regurgitation is mild to moderate.  5. The  aortic valve is tricuspid. Aortic valve regurgitation is mild. No  aortic stenosis is present.  6. There is mild dilatation of the ascending aorta measuring 39 mm.  7. The inferior vena cava is dilated in size with >50% respiratory  variability, suggesting right atrial pressure of 8 mmHg.   Echo 03/26/20: 1. Left ventricular ejection fraction, by estimation, is 60 to 65%. Left  ventricular ejection fraction by 3D volume is 61 %. The left ventricle has  normal function. The left ventricle has no regional wall motion  abnormalities. Left ventricular diastolic  parameters were normal.  2. Right ventricular systolic function is normal. The right ventricular  size is normal. There is normal pulmonary artery systolic pressure.  3. The mitral valve is normal in structure. No evidence of mitral valve  regurgitation. No evidence of mitral stenosis.  4. The aortic valve is normal in structure. Aortic valve regurgitation is  mild. No aortic stenosis is present. Aortic regurgitation PHT measures 458  msec.  5. Aortic dilatation noted. There is mild dilatation of the ascending  aorta, measuring 40 mm.  6. The inferior vena cava is normal in size with greater than 50%  respiratory variability, suggesting right atrial pressure of 3 mmHg.   Recent Labs: 06/20/2020: ALT 27; B Natriuretic Peptide 42.8; BUN 13; Creatinine, Coulter 1.30; Hemoglobin 11.5; Platelets 220; Potassium 4.7; Sodium 136; TSH 6.637    Lipid Panel    Component Value Date/Time   CHOL 94 (L) 08/15/2019 0925   TRIG 93 08/15/2019 0925   HDL 41 08/15/2019 0925   CHOLHDL 2.3 08/15/2019 0925   CHOLHDL 2.8 05/07/2019 0823   VLDL 17 05/07/2019 0823   LDLCALC 35 08/15/2019 0925      Wt Readings from Last 3 Encounters:  06/24/20 155 lb 12.8 oz (70.7 kg)  05/23/20 154 lb 3.2 oz (69.9 kg)  05/14/20 153 lb (69.4 kg)      ASSESSMENT AND PLAN: Persistent atrial fibrillation (HCC) Maintaining sinus rhythm on amiodarone.  Continue  carvedilol and Eliquis.  Ok to hold Eliquis for shoulder surgery.  Hypertension BP is very labile.  We will stop hydralazine and start amlodipine 2.5mg  daily.  He can take hydralazine25 mg as needed for SBP >150.  Continue carvedilol.  Repeat TSH and add T3 and T3-T4 for hypothyroidism.  Check serum catecholamines and metanephrines to rule out pheochromocytoma.  Hypothyroidism TSH was elevated in the hospital.  We will recheck a TSH,  T3, and free T4 today.  This may be contributing to his labile blood pressures.    Chronic combined systolic and diastolic heart failure (HCC) Systolic function has recovered EF 60 to 65% on his most recent echo.  It was 25% at the time of his cath.  Likely hypertensive cardiomyopathy.  Continue carvedilol.  He did not tolerate ARB or Entresto.  Blood pressure management as above.    Current medicines are reviewed at length with the patient today.  The patient does not have concerns regarding medicines.  The following changes have been made: Stop hydralazine and add amlodipine.  He can still take hydralazine as needed for blood pressures that are greater than 1 daily.  Labs/ tests ordered today include:   Orders Placed This Encounter  Procedures  . TSH  . T4, free  . Catecholamines, Fractionated, Plasma  . Metanephrines, plasma  . T3, free     Disposition:   FU with Audra Bellard C. Duke Salvia, MD, [facility] in 3 months.  APP in 1 month  I,Mathew Stumpf,acting as a Neurosurgeon for Chilton Si, MD.,have documented all relevant documentation on the behalf of Chilton Si, MD,as directed by  Chilton Si, MD while in the presence of Chilton Si, MD.   Signed, Dantrell Schertzer C. Duke Salvia, MD, [facility]  06/24/2020 4:58 PM    [facility]

## 2020-06-24 NOTE — Assessment & Plan Note (Signed)
Maintaining sinus rhythm on amiodarone.  Continue carvedilol and Eliquis.  Ok to hold Eliquis for shoulder surgery.

## 2020-06-24 NOTE — Assessment & Plan Note (Signed)
TSH was elevated in the hospital.  We will recheck a TSH, T3, and free T4 today.  This may be contributing to his labile blood pressures.

## 2020-06-24 NOTE — Progress Notes (Signed)
Heart and Vascular Care Navigation  06/24/2020  Seth Snyder [birth year] [id number]  Reason for Referral:    Engaged with patient face to face for follow up visit for Heart and Vascular Care Coordination.                                                                                                   Assessment:                Referred to f/u with pt regarding disability application. Per chart review pt and this Probation officer have previously met at [employer] office. I met with him post labs today in the office. Interpretation provided by [employer] interpreter services. Reintroduced self, role, reason for visit. Pt still residing with his daughter, he is receiving SNAP benefits and denies issues with accessing or affording food or medication. He has been connected to [employer] if needed. Pt shares he has been waiting on disability determination for ~ 10 months. I confirmed that this process is often lengthy and that from what I can see in his chart review Disability Determination Services (DDS) has been sending ROI to Medical Records. He is worried they are not sending information needed. I offered to speak with DDS tomorrow and will call pt w/ interpreter and discuss any updates received. Pt states understanding, he too has been trying to keep up with updates. Provided support for frustration. Pt also shared that he has been waiting on further surgeries due to fluctuating BP, he has spoken with Dr. Oval Linsey about that and is hopeful for improvement that will allow him to further pursue medical work up for other ongoing chronic needs.                     HRT/VAS Care Coordination    Patients Home Cardiology Office [place] Team Social Worker   Social Worker Name: Valeda Malm, [place] [employer] [phone]   Living arrangements for the past 2 months Single Family Home   Lives with: Adult Children   Patient Current Insurance Coverage Medicaid    Patient Has Concern With Paying Medical Bills No   Patient Concerns With Medical Bills Pt now has Medicaid   Medical Bill Referrals: F/u on disability through DDS   Does Patient Have Prescription Coverage? Yes   Patient Prescription Assistance Programs Blue Card   Blue Card Medications currently getting all mediations through [employer] None   DME Agency NA   [facility] Agency NA      Social History:                                                                             SDOH Screenings   Alcohol Screen: Not on file  Depression (PHQ2-9): Low  Risk   . PHQ-2 Score: 1  Financial Resource Strain: Medium Risk  . Difficulty of Paying Living Expenses: Somewhat hard  Food Insecurity: No Food Insecurity  . Worried About Charity fundraiser in the Last Year: Never true  . Ran Out of Food in the Last Year: Never true  Housing: Low Risk   . Last Housing Risk Score: 0  Physical Activity: Not on file  Social Connections: Not on file  Stress: Not on file  Tobacco Use: Medium Risk  . Smoking Tobacco Use: Former Smoker  . Smokeless Tobacco Use: Never Used  Transportation Needs: No Transportation Needs  . Lack of Transportation (Medical): No  . Lack of Transportation (Non-Medical): No    SDOH Interventions: Financial Resources:  Financial Strain Interventions: Other (Comment) (f/u on disability application)  Food Insecurity:  Food Insecurity Interventions: Intervention Not Indicated (pt recieves SNAP)  Housing Insecurity:  Housing Interventions: Intervention Not Indicated (lives w/ daughter)  Transportation:   Transportation Interventions: Financial planner     Other Care Navigation Interventions:     Provided Pharmacy assistance resources  pt denies any current concerns accessing/affording medications.   Follow-up plan:   Once I have reached out to [place] I will f/u with pt w/ the assistance of [place] interpreter to go  over what information I have received. Melinda and Dr. Oval Linsey aware that I have spoken w/ pt. I remain available for any ongoing questions/concerns.

## 2020-06-25 ENCOUNTER — Telehealth: Payer: Self-pay | Admitting: Licensed Clinical Social Worker

## 2020-06-25 NOTE — Progress Notes (Signed)
Heart and Vascular Care Navigation  06/25/2020  Seth Snyder [birth year] [id number]  Reason for Referral:  F/u on Disability Claim/Patient Concerns Engaged with patient by telephone for follow up visit for Heart and Vascular Care Coordination.                                                                                                   Assessment:   LCSW called [phone] to speak with Disability Determination Services regarding pt current case (#[id number]). Was connected w/ Ms. Julian Reil [phone]), pt caseworker who shares that they have received pt clinicals through the beginning of 2022. I shared that pt has been back to the hospital/seen outpatient providers since that time. Ms. Julian Reil shares that she will send new records request to office. She shares that she also is missing some information about pt previous work history, seems even w/ intepreter services there was still some confusion and she hopes to have a family member or friend to call her with pt to go over what they still need. I shared that I would call and let pt know of needed information and to try and find someone to assist him with that.   I was able to call pt and leave a message w/ assistance of [employer] [phone]). Pt then returned my call and I was able to go over the above information w/ assistance of [employer] [phone]). Pt stated that he has already given them that information and they are just waiting on documents from [place]. I again reviewed that I had called office today and they also are in need of that work history information. I explained to pt that I had shared pt additional medical visits and that the worker would ensure additional records requests sent. I also explained to pt that often times these claims can take a very long time to be processed and apologized for the frustration that he feels.    HRT/VAS Care Coordination    Patients Home Cardiology Office [facility]    Outpatient Care Team Social Worker   Social Worker Name: Nile Riggs, [place] [facility] [phone]   Living arrangements for the past 2 months Single Family Home   Lives with: Adult Children   Patient Current Insurance Coverage Medicaid   Patient Has Concern With Paying Medical Bills No   Patient Concerns With Medical Bills and $20,000 in medical bills   Medical Bill Referrals: currently awaiting Medicaid decision- app submitted in February per patient   Does Patient Have Prescription Coverage? Yes   Patient Prescription Assistance Programs Blue Card   Blue Card Medications currently getting all mediations through [facility]   Home Assistive Devices/Equipment None   DME Agency NA   [facility] Agency NA      Social History:  SDOH Screenings   Alcohol Screen: Not on file  Depression (PHQ2-9): Low Risk   . PHQ-2 Score: 1  Financial Resource Strain: Medium Risk  . Difficulty of Paying Living Expenses: Somewhat hard  Food Insecurity: No Food Insecurity  . Worried About Programme researcher, broadcasting/film/video in the Last Year: Never true  . Ran Out of Food in the Last Year: Never true  Housing: Low Risk   . Last Housing Risk Score: 0  Physical Activity: Not on file  Social Connections: Not on file  Stress: Not on file  Tobacco Use: Medium Risk  . Smoking Tobacco Use: Former Smoker  . Smokeless Tobacco Use: Never Used  Transportation Needs: No Transportation Needs  . Lack of Transportation (Medical): No  . Lack of Transportation (Non-Medical): No    SDOH Interventions: Financial Resources:  Financial Strain Interventions: Other (Comment) (f/u on disability application)   Follow-up plan:   Pt will find a family member or friend that will assist him with calling and f/u on this. Pt has my number and DDS number for f/u. I will make a note to f/u with him in 1-2 weeks to ensure he has been able to connect w/ the disability office.

## 2020-06-26 ENCOUNTER — Other Ambulatory Visit: Payer: Self-pay

## 2020-06-26 ENCOUNTER — Ambulatory Visit (INDEPENDENT_AMBULATORY_CARE_PROVIDER_SITE_OTHER): Payer: Medicaid Other | Admitting: Pulmonary Disease

## 2020-06-26 ENCOUNTER — Encounter: Payer: Self-pay | Admitting: Pulmonary Disease

## 2020-06-26 VITALS — BP 116/70 | HR 60 | Temp 97.2°F | Ht 67.0 in | Wt 154.4 lb

## 2020-06-26 DIAGNOSIS — R0600 Dyspnea, unspecified: Secondary | ICD-10-CM

## 2020-06-26 DIAGNOSIS — R0609 Other forms of dyspnea: Secondary | ICD-10-CM

## 2020-06-26 NOTE — Progress Notes (Signed)
Seth Snyder    [id number]    Nov 01, 1960  Primary Care Physician:King, Shana Chute, NP  Referring Physician: Ronney Asters, NP [address] [place],  [place] [address]  Chief complaint: Consult for dyspnea  HPI: 60 year old Nepali speaker with history of atrial fibrillation on Eliquis, major depression, nonischemic cardiomyopathy.  Complains of dyspnea on exertion.  He has constant chest pressure, epigastric pain.  Follows closely with cardiology for this.  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  No mold, hot tub, Jacuzzi Smoking history: 15-pack-year smoker.  Quit in 2020 Travel history: Immigrated from [place] in 2011, previously lived in [place] and [place].  No significant recent travel Relevant family history: No family history of lung disease   Outpatient Encounter Medications as of 06/26/2020  Medication Sig  . acetaminophen (TYLENOL) 500 MG tablet Take 1,000 mg by mouth daily as needed for mild pain.  Marland Kitchen amiodarone (PACERONE) 200 MG tablet TAKE 1 TABLET BY MOUTH EVERY DAY (Patient taking differently: Take 200 mg by mouth daily.)  . amLODipine (NORVASC) 2.5 MG tablet Take 1 tablet (2.5 mg total) by mouth daily.  Marland Kitchen atorvastatin (LIPITOR) 40 MG tablet TAKE 1 TABLET (40 MG TOTAL) BY MOUTH DAILY AT 6 PM.  . citalopram (CELEXA) 40 MG tablet Take 1 tablet (40 mg total) by mouth daily.  Marland Kitchen ELIQUIS 5 MG TABS tablet TAKE 1 TABLET BY MOUTH TWICE A DAY  . gabapentin (NEURONTIN) 300 MG capsule Take 3 capsules (900 mg total) by mouth at bedtime. (Patient taking differently: Take 300-600 mg by mouth See admin instructions. Takes 2 capsule in the morning and 1 capsule at night)  . hydrALAZINE (APRESOLINE) 50 MG tablet Take 50 mg by mouth as directed. TAKE 1/2 TABLET AS NEEDED FOR BLOOD PRESSURE ABOVE 150  . traZODone  (DESYREL) 50 MG tablet Take half to whole tablet at bedtime for sleep.  . carvedilol (COREG) 25 MG tablet Take 1 tablet (25 mg total) by mouth 2 (two) times daily.   No facility-administered encounter medications on file as of 06/26/2020.    Allergies as of 06/26/2020 - Review Complete 06/26/2020  Allergen Reaction Noted  . Entresto [sacubitril-valsartan]  01/04/2020    Past Medical History:  Diagnosis Date  . Anemia   . Atrial fibrillation (HCC)   . Hypertension   . Low back pain     Past Surgical History:  Procedure Laterality Date  . CARDIOVERSION N/A 05/10/2019   Procedure: CARDIOVERSION;  Surgeon: Lewayne Bunting, MD;  Location[facility] ENDOSCOPY;  Service: Cardiovascular;  Laterality: N/A;  . COLONOSCOPY  07/12/2013  . LEFT HEART CATH AND CORONARY ANGIOGRAPHY N/A 08/30/2019   Procedure: LEFT HEART CATH AND CORONARY ANGIOGRAPHY;  Surgeon: Corky Crafts, MD;  Location: [facility] INVASIVE CV LAB;  Service: Cardiovascular;  Laterality: N/A;  . TEE WITHOUT CARDIOVERSION N/A 05/10/2019   Procedure: TRANSESOPHAGEAL ECHOCARDIOGRAM (TEE);  Surgeon: Lewayne Bunting, MD;  Location: [facility] ENDOSCOPY;  Service: Cardiovascular;  Laterality: N/A;    Family History  Problem Relation Age of Onset  . Diabetes Mother   . Diabetes Sister   . Mental illness Sister   . Mental illness Brother     Social History   Socioeconomic History  . Marital status: Single    Spouse name: Not on file  . Number of children: Not  on file  . Years of education: Not on file  . Highest education level: Not on file  Occupational History  . Not on file  Tobacco Use  . Smoking status: Former Smoker    Quit date: 12/21/2018    Years since quitting: 1.5  . Smokeless tobacco: Never Used  Vaping Use  . Vaping Use: Never used  Substance and Sexual Activity  . Alcohol use: Not Currently  . Drug use: Never  . Sexual activity: Not on file  Other Topics Concern  . Not on file  Social History Narrative  . Not on  file   Social Determinants of Health   Financial Resource Strain: Medium Risk  . Difficulty of Paying Living Expenses: Somewhat hard  Food Insecurity: No Food Insecurity  . Worried About Programme researcher, broadcasting/film/video in the Last Year: Never true  . Ran Out of Food in the Last Year: Never true  Transportation Needs: No Transportation Needs  . Lack of Transportation (Medical): No  . Lack of Transportation (Non-Medical): No  Physical Activity: Not on file  Stress: Not on file  Social Connections: Not on file  Intimate Partner Violence: Not on file    Review of systems: Review of Systems  Constitutional: Negative for fever and chills.  HENT: Negative.   Eyes: Negative for blurred vision.  Respiratory: as per HPI  Cardiovascular: Negative for chest pain and palpitations.  Gastrointestinal: Negative for vomiting, diarrhea, blood per rectum. Genitourinary: Negative for dysuria, urgency, frequency and hematuria.  Musculoskeletal: Negative for myalgias, back pain and joint pain.  Skin: Negative for itching and rash.  Neurological: Negative for dizziness, tremors, focal weakness, seizures and loss of consciousness.  Endo/Heme/Allergies: Negative for environmental allergies.  Psychiatric/Behavioral: Negative for depression, suicidal ideas and hallucinations.  All other systems reviewed and are negative.  Physical Exam: Blood pressure 116/70, pulse 60, temperature (!) 97.2 F (36.2 C), temperature source Temporal, height 5\' 7"  (1.702 m), weight 154 lb 6.4 oz (70 kg), SpO2 98 %. Gen:      No acute distress HEENT:  EOMI, sclera anicteric Neck:     No masses; no thyromegaly Lungs:    Clear to auscultation bilaterally; normal respiratory effort CV:         Regular rate and rhythm; no murmurs Abd:      + bowel sounds; soft, non-tender; no palpable masses, no distension Ext:    No edema; adequate peripheral perfusion Skin:      Warm and dry; no rash Neuro: alert and oriented x 3 Psych: normal mood  and affect  Data Reviewed: Imaging: Chest x-ray 06/20/2020-no active disease.  I have reviewed the images personally.  PFTs:  Labs:  Assessment:  Dyspnea on exertion Unclear if this is lung related.  Recent chest x-ray and lung imaging shows no abnormality.  He does have significant cardiac issues including atrial fibrillation and cardiomyopathy.  Evaluate for COPD given smoking history.  Schedule PFTs  Plan/Recommendations: PFTs  08/20/2020 MD [facility] 06/26/2020, 11:29 AM  CC: 08/26/2020, NP

## 2020-06-26 NOTE — Patient Instructions (Signed)
We will schedule PFTs and follow-up in clinic in 1 to 2 months.

## 2020-06-30 ENCOUNTER — Ambulatory Visit: Payer: Self-pay | Admitting: Nurse Practitioner

## 2020-07-02 ENCOUNTER — Other Ambulatory Visit: Payer: Self-pay

## 2020-07-02 ENCOUNTER — Ambulatory Visit (INDEPENDENT_AMBULATORY_CARE_PROVIDER_SITE_OTHER): Payer: Medicaid Other | Admitting: Nurse Practitioner

## 2020-07-02 ENCOUNTER — Encounter: Payer: Self-pay | Admitting: Nurse Practitioner

## 2020-07-02 VITALS — BP 141/82 | HR 57 | Temp 98.0°F | Ht 67.0 in | Wt 154.0 lb

## 2020-07-02 DIAGNOSIS — R300 Dysuria: Secondary | ICD-10-CM

## 2020-07-02 DIAGNOSIS — R197 Diarrhea, unspecified: Secondary | ICD-10-CM

## 2020-07-02 DIAGNOSIS — R42 Dizziness and giddiness: Secondary | ICD-10-CM

## 2020-07-02 DIAGNOSIS — E039 Hypothyroidism, unspecified: Secondary | ICD-10-CM | POA: Diagnosis not present

## 2020-07-02 DIAGNOSIS — F331 Major depressive disorder, recurrent, moderate: Secondary | ICD-10-CM | POA: Diagnosis not present

## 2020-07-02 LAB — POCT URINALYSIS DIPSTICK
Bilirubin, UA: NEGATIVE
Blood, UA: NEGATIVE
Glucose, UA: NEGATIVE
Ketones, UA: 1.02
Leukocytes, UA: NEGATIVE
Nitrite, UA: NEGATIVE
Protein, UA: NEGATIVE
Spec Grav, UA: 1.02 (ref 1.010–1.025)
Urobilinogen, UA: 0.2 E.U./dL
pH, UA: 5.5 (ref 5.0–8.0)

## 2020-07-02 LAB — T4, FREE: Free T4: 1.04 ng/dL (ref 0.82–1.77)

## 2020-07-02 LAB — CATECHOLAMINES, FRACTIONATED, PLASMA
Dopamine: 36 pg/mL (ref 0–48)
Epinephrine: 71 pg/mL — ABNORMAL HIGH (ref 0–62)
Norepinephrine: 879 pg/mL — ABNORMAL HIGH (ref 0–874)

## 2020-07-02 LAB — T3, FREE: T3, Free: 2.9 pg/mL (ref 2.0–4.4)

## 2020-07-02 LAB — METANEPHRINES, PLASMA
Metanephrine, Free: 36.8 pg/mL (ref 0.0–88.0)
Normetanephrine, Free: 129 pg/mL (ref 0.0–244.0)

## 2020-07-02 LAB — TSH: TSH: 11.8 u[IU]/mL — ABNORMAL HIGH (ref 0.450–4.500)

## 2020-07-02 MED ORDER — BISMUTH SUBSALICYLATE 262 MG PO CHEW: 524.0000 mg | CHEWABLE_TABLET | ORAL | 0 refills | Status: DC | PRN

## 2020-07-02 NOTE — Progress Notes (Signed)
Established Patient Office Visit  Subjective:  Patient ID: Seth Snyder, male    DOB: 10/04/1960  Age: 60 y.o. MRN: [id number]  CC:  Chief Complaint  Patient presents with  . Follow-up    Follow up , surgery clearance     HPI [facility] presents for follow up to achieve medical clearance for upcoming surgical procedure. The patient has a plethora of medical complaints today. The patient expressed concerns related to    Past Medical History:  Diagnosis Date  . Anemia   . Atrial fibrillation (HCC)   . Hypertension   . Low back pain     Past Surgical History:  Procedure Laterality Date  . CARDIOVERSION N/A 05/10/2019   Procedure: CARDIOVERSION;  Surgeon: Lewayne Bunting, MD;  Location: [facility] ENDOSCOPY;  Service: Cardiovascular;  Laterality: N/A;  . COLONOSCOPY  07/12/2013  . LEFT HEART CATH AND CORONARY ANGIOGRAPHY N/A 08/30/2019   Procedure: LEFT HEART CATH AND CORONARY ANGIOGRAPHY;  Surgeon: Corky Crafts, MD;  Location: [facility] INVASIVE CV LAB;  Service: Cardiovascular;  Laterality: N/A;  . TEE WITHOUT CARDIOVERSION N/A 05/10/2019   Procedure: TRANSESOPHAGEAL ECHOCARDIOGRAM (TEE);  Surgeon: Lewayne Bunting, MD;  Location: [facility] ENDOSCOPY;  Service: Cardiovascular;  Laterality: N/A;    Family History  Problem Relation Age of Onset  . Diabetes Mother   . Diabetes Sister   . Mental illness Sister   . Mental illness Brother     Social History   Socioeconomic History  . Marital status: Single    Spouse name: Not on file  . Number of children: Not on file  . Years of education: Not on file  . Highest education level: Not on file  Occupational History  . Not on file  Tobacco Use  . Smoking status: Former Smoker    Quit date: 12/21/2018    Years since quitting: 1.5  . Smokeless tobacco: Never Used  Vaping Use  . Vaping Use: Never used  Substance and Sexual Activity  . Alcohol use: Not Currently  . Drug use: Never  . Sexual activity: Not on file  Other  Topics Concern  . Not on file  Social History Narrative  . Not on file   Social Determinants of Health   Financial Resource Strain: Medium Risk  . Difficulty of Paying Living Expenses: Somewhat hard  Food Insecurity: No Food Insecurity  . Worried About Programme researcher, broadcasting/film/video in the Last Year: Never true  . Ran Out of Food in the Last Year: Never true  Transportation Needs: No Transportation Needs  . Lack of Transportation (Medical): No  . Lack of Transportation (Non-Medical): No  Physical Activity: Not on file  Stress: Not on file  Social Connections: Not on file  Intimate Partner Violence: Not on file    Outpatient Medications Prior to Visit  Medication Sig Dispense Refill  . acetaminophen (TYLENOL) 500 MG tablet Take 1,000 mg by mouth daily as needed for mild pain.    Marland Kitchen amiodarone (PACERONE) 200 MG tablet TAKE 1 TABLET BY MOUTH EVERY DAY (Patient taking differently: Take 200 mg by mouth daily.) 90 tablet 1  . amLODipine (NORVASC) 2.5 MG tablet Take 1 tablet (2.5 mg total) by mouth daily. 90 tablet 3  . atorvastatin (LIPITOR) 40 MG tablet TAKE 1 TABLET (40 MG TOTAL) BY MOUTH DAILY AT 6 PM. 90 tablet 3  . citalopram (CELEXA) 40 MG tablet Take 1 tablet (40 mg total) by mouth daily. 90 tablet 1  .  ELIQUIS 5 MG TABS tablet TAKE 1 TABLET BY MOUTH TWICE A DAY 180 tablet 1  . gabapentin (NEURONTIN) 300 MG capsule Take 3 capsules (900 mg total) by mouth at bedtime. (Patient taking differently: Take 300-600 mg by mouth See admin instructions. Takes 2 capsule in the morning and 1 capsule at night) 90 capsule 11  . hydrALAZINE (APRESOLINE) 50 MG tablet Take 50 mg by mouth as directed. TAKE 1/2 TABLET AS NEEDED FOR BLOOD PRESSURE ABOVE 150    . traZODone (DESYREL) 50 MG tablet Take half to whole tablet at bedtime for sleep. 90 tablet 1  . carvedilol (COREG) 25 MG tablet Take 1 tablet (25 mg total) by mouth 2 (two) times daily. 180 tablet 3   No facility-administered medications prior to visit.     Allergies  Allergen Reactions  . Entresto [Sacubitril-Valsartan]     Abnormal kidney functions     ROS Review of Systems  Constitutional: Negative for chills and fever.  HENT: Positive for sinus pain (bilateral maxillary). Negative for congestion, ear pain (left ear) and postnasal drip.   Eyes: Negative.   Respiratory: Negative for cough, chest tightness, shortness of breath (at rest) and wheezing.   Cardiovascular: Positive for chest pain (burning in center of chest x 1 year).  Gastrointestinal: Positive for abdominal pain (burning x 1 year), diarrhea (one day beginning 07/01/20 x 8 occurences ) and nausea (with low or high  blood pressure). Negative for blood in stool, constipation and vomiting.  Endocrine: Negative.   Genitourinary: Positive for dysuria (with urination), frequency and urgency. Negative for hematuria.  Musculoskeletal: Positive for back pain (continuous > 1 year. 9/10).  Skin: Negative for rash.       Generalized itching. Denies rash/wounds  Allergic/Immunologic: Positive for food allergies (eggs and milk products). Negative for environmental allergies.  Neurological: Positive for dizziness (continuous) and numbness (right arm radiating into fingers tips). Negative for light-headedness.  Hematological: Negative.  Does not bruise/bleed easily.  Psychiatric/Behavioral: Negative.  Negative for agitation, self-injury and suicidal ideas. The patient is not nervous/anxious.       Objective:    Physical Exam Constitutional:      Appearance: Normal appearance. He is normal weight.  HENT:     Head: Normocephalic.     Right Ear: Tympanic membrane, ear canal and external ear normal.     Nose: Nose normal. No congestion or rhinorrhea.     Mouth/Throat:     Mouth: Mucous membranes are moist.     Pharynx: Oropharynx is clear.  Cardiovascular:     Rate and Rhythm: Normal rate and regular rhythm.     Pulses: Normal pulses.     Heart sounds: Normal heart sounds. No  murmur heard. No friction rub. No gallop.   Pulmonary:     Effort: Pulmonary effort is normal. No respiratory distress.     Breath sounds: Normal breath sounds. No wheezing, rhonchi or rales.  Abdominal:     General: Abdomen is flat. Bowel sounds are normal.     Palpations: Abdomen is soft.     Hernia: No hernia is present.  Musculoskeletal:        General: No swelling.     Cervical back: Tenderness: upon palpitation.     Right lower leg: No edema.     Left lower leg: No edema.  Skin:    General: Skin is warm and dry.  Neurological:     General: No focal deficit present.     Mental Status: He  is alert and oriented to person, place, and time.     Sensory: No sensory deficit.     Motor: No weakness.  Psychiatric:        Mood and Affect: Mood normal.        Behavior: Behavior normal.        Thought Content: Thought content normal.        Judgment: Judgment normal.     BP (!) 141/82 (BP Location: Left Arm, Patient Position: Sitting, Cuff Size: Normal)   Pulse (!) 57   Temp 98 F (36.7 C) (Temporal)   Ht 5\' 7"  (1.702 m)   Wt 154 lb (69.9 kg)   SpO2 98%   BMI 24.12 kg/m  Wt Readings from Last 3 Encounters:  07/02/20 154 lb (69.9 kg)  06/26/20 154 lb 6.4 oz (70 kg)  06/24/20 155 lb 12.8 oz (70.7 kg)     There are no preventive care reminders to display for this patient.  There are no preventive care reminders to display for this patient.  Lab Results  Component Value Date   TSH 11.800 (H) 06/24/2020   Lab Results  Component Value Date   WBC 6.1 06/20/2020   HGB 11.5 (L) 06/20/2020   HCT 36.4 (L) 06/20/2020   MCV 89.2 06/20/2020   PLT 220 06/20/2020   Lab Results  Component Value Date   NA 136 06/20/2020   K 4.7 06/20/2020   CO2 22 06/20/2020   GLUCOSE 86 06/20/2020   BUN 13 06/20/2020   CREATININE 1.30 (H) 06/20/2020   BILITOT 0.8 06/20/2020   ALKPHOS 58 06/20/2020   AST 26 06/20/2020   ALT 27 06/20/2020   PROT 7.0 06/20/2020   ALBUMIN 4.0 06/20/2020    CALCIUM 9.0 06/20/2020   ANIONGAP 8 06/20/2020   Lab Results  Component Value Date   CHOL 94 (L) 08/15/2019   Lab Results  Component Value Date   HDL 41 08/15/2019   Lab Results  Component Value Date   LDLCALC 35 08/15/2019   Lab Results  Component Value Date   TRIG 93 08/15/2019   Lab Results  Component Value Date   CHOLHDL 2.3 08/15/2019   No results found for: HGBA1C    Assessment & Plan:   Problem List Items Addressed This Visit   None     No orders of the defined types were placed in this encounter.   Follow-up: No follow-ups on file.    08/17/2019, RN

## 2020-07-02 NOTE — Progress Notes (Signed)
Established Patient Office Visit  Subjective:  Patient ID: Seth Snyder, male    DOB: 1960-12-24  Age: 60 y.o. MRN: [id number]  CC:  Chief Complaint  Patient presents with  . Follow-up    Follow up , surgery clearance     HPI Seth Snyder [facility] presents for follow up. He  has a past medical history of Anemia, Atrial fibrillation (HCC), Hypertension, and Low back pain.   He has been to the ED on 05/21/2020 and 06/20/2020 for dizziness.  He is for follow-up with cardiology with some changes in his medication.  Hydralazine was stopped.  He was started on amlodipine 2.5 mg daily.  He will continue the carvedilol.  He is to take drowsing 25 mg if needed for SBP greater than 150.  He is in today however is not able to verbalize which medications he takes.  He continues with multiple symptoms from dizziness chest pain to neck and back pain. He also states he had 8 episodes of diarrhea today and he is not taking any over-the-counter medications.  Past Medical History:  Diagnosis Date  . Anemia   . Atrial fibrillation (HCC)   . Hypertension   . Low back pain     Past Surgical History:  Procedure Laterality Date  . CARDIOVERSION N/A 05/10/2019   Procedure: CARDIOVERSION;  Surgeon: Lewayne Bunting, MD;  Location: [facility] ENDOSCOPY;  Service: Cardiovascular;  Laterality: N/A;  . COLONOSCOPY  07/12/2013  . LEFT HEART CATH AND CORONARY ANGIOGRAPHY N/A 08/30/2019   Procedure: LEFT HEART CATH AND CORONARY ANGIOGRAPHY;  Surgeon: Corky Crafts, MD;  Location: [facility] INVASIVE CV LAB;  Service: Cardiovascular;  Laterality: N/A;  . TEE WITHOUT CARDIOVERSION N/A 05/10/2019   Procedure: TRANSESOPHAGEAL ECHOCARDIOGRAM (TEE);  Surgeon: Lewayne Bunting, MD;  Location: [facility] ENDOSCOPY;  Service: Cardiovascular;  Laterality: N/A;    Family History  Problem Relation Age of Onset  . Diabetes Mother   . Diabetes Sister   . Mental illness Sister   . Mental illness Brother     Social History    Socioeconomic History  . Marital status: Single    Spouse name: Not on file  . Number of children: Not on file  . Years of education: Not on file  . Highest education level: Not on file  Occupational History  . Not on file  Tobacco Use  . Smoking status: Former Smoker    Quit date: 12/21/2018    Years since quitting: 1.5  . Smokeless tobacco: Never Used  Vaping Use  . Vaping Use: Never used  Substance and Sexual Activity  . Alcohol use: Not Currently  . Drug use: Never  . Sexual activity: Not on file  Other Topics Concern  . Not on file  Social History Narrative  . Not on file   Social Determinants of Health   Financial Resource Strain: Medium Risk  . Difficulty of Paying Living Expenses: Somewhat hard  Food Insecurity: No Food Insecurity  . Worried About Programme researcher, broadcasting/film/video in the Last Year: Never true  . Ran Out of Food in the Last Year: Never true  Transportation Needs: No Transportation Needs  . Lack of Transportation (Medical): No  . Lack of Transportation (Non-Medical): No  Physical Activity: Not on file  Stress: Not on file  Social Connections: Not on file  Intimate Partner Violence: Not on file    Outpatient Medications Prior to Visit  Medication Sig Dispense Refill  . acetaminophen (TYLENOL) 500 MG tablet  Take 1,000 mg by mouth daily as needed for mild pain.    Marland Kitchen amiodarone (PACERONE) 200 MG tablet TAKE 1 TABLET BY MOUTH EVERY DAY (Patient taking differently: Take 200 mg by mouth daily.) 90 tablet 1  . amLODipine (NORVASC) 2.5 MG tablet Take 1 tablet (2.5 mg total) by mouth daily. 90 tablet 3  . atorvastatin (LIPITOR) 40 MG tablet TAKE 1 TABLET (40 MG TOTAL) BY MOUTH DAILY AT 6 PM. 90 tablet 3  . citalopram (CELEXA) 40 MG tablet Take 1 tablet (40 mg total) by mouth daily. 90 tablet 1  . ELIQUIS 5 MG TABS tablet TAKE 1 TABLET BY MOUTH TWICE A DAY 180 tablet 1  . gabapentin (NEURONTIN) 300 MG capsule Take 3 capsules (900 mg total) by mouth at bedtime.  (Patient taking differently: Take 300-600 mg by mouth See admin instructions. Takes 2 capsule in the morning and 1 capsule at night) 90 capsule 11  . hydrALAZINE (APRESOLINE) 50 MG tablet Take 50 mg by mouth as directed. TAKE 1/2 TABLET AS NEEDED FOR BLOOD PRESSURE ABOVE 150    . traZODone (DESYREL) 50 MG tablet Take half to whole tablet at bedtime for sleep. 90 tablet 1  . carvedilol (COREG) 25 MG tablet Take 1 tablet (25 mg total) by mouth 2 (two) times daily. 180 tablet 3   No facility-administered medications prior to visit.    Allergies  Allergen Reactions  . Entresto [Sacubitril-Valsartan]     Abnormal kidney functions     ROS Review of Systems    Objective:    Physical Exam Constitutional:      General: He is not in acute distress.    Appearance: He is not ill-appearing, toxic-appearing or diaphoretic.  HENT:     Head: Normocephalic and atraumatic.  Cardiovascular:     Rate and Rhythm: Normal rate and regular rhythm.     Pulses: Normal pulses.     Heart sounds: Normal heart sounds.  Pulmonary:     Effort: Pulmonary effort is normal.     Breath sounds: Normal breath sounds.  Abdominal:     Palpations: Abdomen is soft.  Musculoskeletal:        General: Tenderness (Right groin area figure-of-four positive) present.     Cervical back: Normal range of motion. Tenderness present.  Skin:    General: Skin is warm and dry.     Capillary Refill: Capillary refill takes less than 2 seconds.  Neurological:     General: No focal deficit present.     Mental Status: He is alert and oriented to person, place, and time.  Psychiatric:        Behavior: Behavior normal.     BP (!) 141/82 (BP Location: Left Arm, Patient Position: Sitting, Cuff Size: Normal)   Pulse (!) 57   Temp 98 F (36.7 C) (Temporal)   Ht 5\' 7"  (1.702 m)   Wt 154 lb (69.9 kg)   SpO2 98%   BMI 24.12 kg/m  Wt Readings from Last 3 Encounters:  07/02/20 154 lb (69.9 kg)  06/26/20 154 lb 6.4 oz (70 kg)   06/24/20 155 lb 12.8 oz (70.7 kg)     There are no preventive care reminders to display for this patient.  There are no preventive care reminders to display for this patient.  Lab Results  Component Value Date   TSH 11.800 (H) 06/24/2020   Lab Results  Component Value Date   WBC 6.1 06/20/2020   HGB 11.5 (L) 06/20/2020  HCT 36.4 (L) 06/20/2020   MCV 89.2 06/20/2020   PLT 220 06/20/2020   Lab Results  Component Value Date   NA 136 06/20/2020   K 4.7 06/20/2020   CO2 22 06/20/2020   GLUCOSE 86 06/20/2020   BUN 13 06/20/2020   CREATININE 1.30 (H) 06/20/2020   BILITOT 0.8 06/20/2020   ALKPHOS 58 06/20/2020   AST 26 06/20/2020   ALT 27 06/20/2020   PROT 7.0 06/20/2020   ALBUMIN 4.0 06/20/2020   CALCIUM 9.0 06/20/2020   ANIONGAP 8 06/20/2020   Lab Results  Component Value Date   CHOL 94 (L) 08/15/2019   Lab Results  Component Value Date   HDL 41 08/15/2019   Lab Results  Component Value Date   LDLCALC 35 08/15/2019   Lab Results  Component Value Date   TRIG 93 08/15/2019   Lab Results  Component Value Date   CHOLHDL 2.3 08/15/2019   No results found for: HGBA1C    Assessment & Plan:   Problem List Items Addressed This Visit      Endocrine   Hypothyroidism Reevaluation TSH with cardiology and labs sent.  No T3 T4 was completed   Relevant Orders   T3 (Completed)   T4, free (Completed)     Other   MDD (major depressive disorder), recurrent episode, moderate (HCC) Stable on current treatment    Other Visit Diagnoses    Dizziness    -  Primary Persistent referral to neurology for further evaluation   Relevant Orders   Ambulatory referral to Neurology   Diarrhea, unspecified type     Recommend Pepto-Bismol Chewable tablets. Concern with Imodium due to patient's inability to follow prescription instructions carefully   Dysuria     Urine culture pending   Relevant Orders   POCT urinalysis dipstick (Completed)   Urine Culture      Meds  ordered this encounter  Medications  . bismuth subsalicylate (PEPTO-BISMOL) 262 MG chewable tablet    Sig: Chew 2 tablets (524 mg total) by mouth as needed.    Dispense:  30 tablet    Refill:  0    Order Specific Question:   Supervising Provider    Answer:   Quentin Angst [id number]    Follow-up: Return in about 3 months (around 10/01/2020) for [id number] hypotyroidism .    Barbette Merino, NP

## 2020-07-02 NOTE — Patient Instructions (Addendum)
Syncope Syncope is when you pass out (faint) for a short time. It is caused by a sudden decrease in blood flow to the brain. Signs that you may be about to pass out include:  Feeling dizzy or light-headed.  Feeling sick to your stomach (nauseous).  Seeing all white or all black.  Having cold, clammy skin. If you pass out, get help right away. Call your local emergency services (911 in the [place] Do not drive yourself to the hospital. Follow these instructions at home: Watch for any changes in your symptoms. Take these actions to stay safe and help with your symptoms: Lifestyle  Do not drive, use machinery, or play sports until your doctor says it is okay.  Do not drink alcohol.  Do not use any products that contain nicotine or tobacco, such as cigarettes and e-cigarettes. If you need help quitting, ask your doctor.  Drink enough fluid to keep your pee (urine) pale yellow. General instructions  Take over-the-counter and prescription medicines only as told by your doctor.  If you are taking blood pressure or heart medicine, sit up and stand up slowly. Spend a few minutes getting ready to sit and then stand. This can help you feel less dizzy.  Have someone stay with you until you feel stable.  If you start to feel like you might pass out, lie down right away and raise (elevate) your feet above the level of your heart. Breathe deeply and steadily. Wait until all of the symptoms are gone.  Keep all follow-up visits as told by your doctor. This is important. Get help right away if:  You have a very bad headache.  You pass out once or more than once.  You have pain in your chest, belly, or back.  You have a very fast or uneven heartbeat (palpitations).  It hurts to breathe.  You are bleeding from your mouth or your bottom (rectum).  You have black or tarry poop (stool).  You have jerky movements that you cannot control (seizure).  You are confused.  You have trouble  walking.  You are very weak.  You have vision problems. These symptoms may be an emergency. Do not wait to see if the symptoms will go away. Get medical help right away. Call your local emergency services (911 in the [place] Do not drive yourself to the hospital. Summary  Syncope is when you pass out (faint) for a short time. It is caused by a sudden decrease in blood flow to the brain.  Signs that you may be about to faint include feeling dizzy, light-headed, or sick to your stomach, seeing all white or all black, or having cold, clammy skin.  If you start to feel like you might pass out, lie down right away and raise (elevate) your feet above the level of your heart. Breathe deeply and steadily. Wait until all of the symptoms are gone. This information is not intended to replace advice given to you by your health care provider. Make sure you discuss any questions you have with your health care provider. Document Revised: 04/18/2019 Document Reviewed: 04/20/2017 Elsevier Patient Education  2021 [employer]  Bismuth Subsalicylate chewable tablets What is this medicine? BISMUTH SUBSALICYLATE (biz muth sub sa LIS i late) relieves the symptoms of diarrhea, upset stomach, heartburn, acid indigestion, and nausea. This medicine may be used for other purposes; ask your health care provider or pharmacist if you have questions. COMMON BRAND NAME(S): Bismatrol, Peptic Relief, Pepto-Bismol, Pink Bismuth What should  I tell my health care provider before I take this medicine? They need to know if you have any of these conditions:  bleeding problems  fever  kidney disease  liver disease  recent vaccination with chickenpox vaccine  recent viral illness, such as the flu or chickenpox  ulcer  an unusual or allergic reaction to bismuth subsalicylate, aspirin, other salicylates or other medicines, foods, dyes, or preservatives  pregnant or trying to get pregnant  breast-feeding How should  I use this medicine? Take this medicine by mouth. Follow the directions on the label. Chew or let the tablet dissolve in your mouth. Do not take your medicine more often than directed. Talk to your pediatrician regarding the use of this medicine in children. While this medicine may be used in children as young as 12 years for selected conditions, precautions do apply. Overdosage: If you think you have taken too much of this medicine contact a poison control center or emergency room at once. NOTE: This medicine is only for you. Do not share this medicine with others. What if I miss a dose? If you miss a dose, take it as soon as you can. If it is almost time for your next dose, take only that dose. Do not take double or extra doses. What may interact with this medicine? Do not take this medicine with any of the following medications:  methotrexate This medicine may also interact with the following medications:  aspirin and aspirin-like medicines  medicines for diabetes  medicines for gout  medicines that treat or prevent blood clots like warfarin This list may not describe all possible interactions. Give your health care provider a list of all the medicines, herbs, non-prescription drugs, or dietary supplements you use. Also tell them if you smoke, drink alcohol, or use illegal drugs. Some items may interact with your medicine. What should I watch for while using this medicine? Tell your doctor or health care professional if your symptoms do not start to get better or if they get worse. Do not treat diarrhea for more than 2 days without talking to your doctor or health care professional. Call your doctor or health care professional as soon as you can if you get a fever, or nausea and vomiting. These could be symptoms of a more serious illness. Drink plenty of clear fluids to help prevent dehydration caused by diarrhea. What side effects may I notice from receiving this medicine? Side effects  that you should report to your doctor or health care professional as soon as possible:  anxiety, confusion  dizziness, drowsiness  headache  increased sweating  increased thirst  loss of hearing, ringing in the ears  muscle weakness  nausea, vomiting that does not go away  tiredness  trembling, or uncontrollable movements Side effects that usually do not require medical attention (report to your doctor or health care professional if they continue or are bothersome):  black stools  blackened tongue  constipation This list may not describe all possible side effects. Call your doctor for medical advice about side effects. You may report side effects to FDA at 1-800-FDA-1088. Where should I keep my medicine? Keep out of the reach of children. Store at room temperature between 15 and 30 degrees C (59 and 86 degrees F). Do not freeze. Throw away any unused medicine after the expiration date. NOTE: This sheet is a summary. It may not cover all possible information. If you have questions about this medicine, talk to your doctor, pharmacist, or health  care provider.  2021 [employer] Standard (2007-06-08 14:15:19)

## 2020-07-03 LAB — T4, FREE: Free T4: 0.98 ng/dL (ref 0.82–1.77)

## 2020-07-03 LAB — T3: T3, Total: 83 ng/dL (ref 71–180)

## 2020-07-05 LAB — URINE CULTURE

## 2020-07-25 NOTE — Progress Notes (Signed)
Cardiology Clinic Note   Patient Name: Seth Snyder Date of Encounter: 07/28/2020  Primary Care Provider:  Barbette MerinoKing, [name] M, NP Primary Cardiologist:  Chilton Siiffany Hinds, MD  Patient Profile    Seth Snyder 60 year old male presents today for follow-up of his atrial fibrillation, HTN,CAD,and combined systolic and diastolic heart failure.  Past Medical History    Past Medical History:  Diagnosis Date  . Anemia   . Atrial fibrillation (HCC)   . Hypertension   . Low back pain    Past Surgical History:  Procedure Laterality Date  . CARDIOVERSION N/A 05/10/2019   Procedure: CARDIOVERSION;  Surgeon: Lewayne Buntingrenshaw, Brian S, MD;  [facility];  Service: Cardiovascular;  Laterality: N/A;  . COLONOSCOPY  07/12/2013  . LEFT HEART CATH AND CORONARY ANGIOGRAPHY N/A 08/30/2019   Procedure: LEFT HEART CATH AND CORONARY ANGIOGRAPHY;  Surgeon: Corky CraftsVaranasi, Jayadeep S, MD;  [facility];  Service: Cardiovascular;  Laterality: N/A;  . TEE WITHOUT CARDIOVERSION N/A 05/10/2019   Procedure: TRANSESOPHAGEAL ECHOCARDIOGRAM (TEE);  Surgeon: Lewayne Buntingrenshaw, Brian S, MD;  [facility];  Service: Cardiovascular;  Laterality: N/A;    Allergies  Allergies  Allergen Reactions  . Entresto [Sacubitril-Valsartan]     Abnormal kidney functions     History of Present Illness    Mr.Samalhas a PMH of paroxysmal [place] [facility] and was admitted 02/12/2019-02/17/2019 for hypertensive urgency and A. fib/flutter. He was started on Eliquis 5 mg twice daily, aspirin 81 mg and metoprolol ER 100 mg. He followed up with his PCP 03/12/2019 and was stable. At that time he indicated that [place] His PMH also includes hypothyroidism, lower extremity venous insufficiency, HTN, anemia, and tobacco abuse.  He was admitted to the hospital  05/07/2019-05/10/2019 with A. fib RVR. His echocardiogram 05/07/2019 showed an LVEF of 45-50%, diastolic dysfunction could not be evaluated, mildly elevated pulmonary arterial pressures, mild mitral regurgitation, tricuspid regurgitation mild-moderate, and mild aortic regurgitation. He underwent TEE and DCCV 05/10/2019 which was successful. (120 J to normal sinus rhythm, atrial fibrillation recurred and repeat DCCV with 150 J resulted in sinus rhythm ). He was started on metoprolol 150 mg and due to his LV function, age, and lack of insurance options for antiarrhythmics were limited. His thyroid function was also noted to be mildly abnormal making amiodarone an undesirable option. Apixaban 5mg twice daily was continued.  He presentedto the clinic 2/24/21for follow-up and statedhewashaving right shoulder pain. This hadbeen ongoing for several months. He didnot recall any fall, work-related injury or trauma. His painwasreproducible in the office with light and deep palpation.When asked about his breathing and his heart rate he statedhe feltwell. He hadnot noticed any irregular heartbeats or increased heart rate since his cardioversion. He hadbeen compliant withEliquis.  He underwent a nuclear stress test on 05/23/2019. There was no ST deviation during his stress test. 2 defects were noted. There was a fixed defect at the apex that suggested infarct and a reversible inferior defect that suggested ischemia. Cardiac catheterization was recommended. Several attempts were made to contact the patient and detailed messages were left about scheduling a cardiac catheterization. A translated letter was also sent.  He presents the clinic today for follow-up evaluation and stateshe does not have any chest pain. He is no longer working due to his right shoulder and neck pain. His stress test results were reviewed and he is agreeable to cardiac catheterization. I  explained to him that in  order to give him preoperative cardiac clearance the procedure needs to be done. He states he has had occasional periods of palpitations but overall feels well today except for his neck and shoulder pain.  He was seen by Dr. Duke Salvia on 03/11/2020.  At that time he was noted to be hypertensive and was not cleared for upcoming surgery.  He complained of itchiness and palpitations.  He was seen by Dr. Johney Frame 03/26/2020.  During that time he was doing fairly well.  His palpitations were controlled on amiodarone.  Dr. Johney Frame wished to continue to treat medically as opposed to proceed with ablation procedure due to cardiomyopathy and hypertension.  Recommended reducing amiodarone to 100 mg if no recurrence of atrial fibrillation in 6 months.  He presented to the emergency department 05/21/20 with complaints of dizziness and low blood pressure.  Has history of orthostatic hypotension was reviewed.  He reported that he was dizzy and had a headache when he woke up in the morning.  He called EMS.  He was given a 500 cc fluid bolus.  His blood pressure on arrival was 150 systolic.  He denied chest pain or shortness of breath.  He had been seen in the emergency department 1 week prior for similar symptoms.  He continued to take hydralazine 75 mg twice daily.  A CT of his head showed no acute findings.  His blood pressure rose to over 200 systolic and then decreased to 170s systolic with hydralazine dosing.  He was asked to maintain a blood pressure log and follow-up with cardiology.  Discharged 2143.  He presented to the clinic 05/23/2020 for follow-up evaluation stated he continued to have shortness of breath and dyspnea with exertion.  We reviewed his prior echocardiogram and cardiac catheterization.  He reported that he had constant chest pressure which was worse with deep breaths.  His heart rate today was 57.  He continued to have right arm pain as well.  He reported that he had been trying to stay better hydrated.   His blood pressure was 132/82.  I  refered him to pulmonology for  evaluation of his dyspnea and to have PFTs.  We continued his current medication regimen and planned follow-up in 3 months.  He was seen by Dr. Duke Salvia on 06/24/2020.  He was not doing well at that visit.  He had concerns about his blood pressure fluctuating.  He reported it was higher in the mornings and lower in the afternoons.  He also reported sudden fluctuations in his blood pressure.  He noted that when his blood pressure was high he would have dizziness.  When his blood pressure was low he would have shortness of breath palpitations and burning in his chest.  He reported patient sharp pain in his feet.  He was taking carvedilol, hydralazine, Tylenol, and Eliquis in the a.m. and in p.m.  He reported a fall on 06/20/2020.  He was seen in the ED.  His blood pressure at that time was well controlled.  He denied lower extremity edema or PND.  He reported that he did not salt his food.  He reported eating mostly fruits and vegetables.  He reported a supplement that has daughter would give him but, did not know what it was.  He reported he would bring it to his next visit.  He had not had his neck/shoulder procedures completed due to his fluctuating blood pressure.  His hydralazine was discontinued and he was started  on amlodipine 2.5 mg.  He was instructed to take hydralazine 25 mg as needed for blood pressure greater than 150 systolic.  His TSH was abnormal.  He was instructed to follow-up with his PCP.  He presents the clinic today for follow-up evaluation states he continues to have fluctuations in his blood pressure.  He presents with a interpreter.  I reviewed his blood pressure log which shows much better controlled blood pressures.  He does however have occasional blood pressures systolic in the 80s-70s.  When asked if he takes his blood pressure medication on these days he reports that he continues to take his blood pressure medication on  these days.  I have asked him to hold his amlodipine medication when he notices systolic blood pressures less than 100.  We reviewed his hydralazine dosing and he reports that he is only taking 25 mg for blood pressures above 150.  Even with interpreter  present his assessment is difficult to distinguish.  His assessment is pan positive.  He does report that his breathing is somewhat better.  I will have him follow-up in 4-6 months and as needed.  I have also encouraged him to be more physically active and do light lower extremity stretching.  Today hedenies increased shortness of breath, increased lower extremity edema, fatigue, palpitations, melena, hematuria, hemoptysis, diaphoresis, presyncope, syncope, orthopnea, and PND.  Home Medications    Prior to Admission medications   Medication Sig Start Date End Date Taking? Authorizing Provider  acetaminophen (TYLENOL) 500 MG tablet Take 1,000 mg by mouth daily as needed for mild pain.    [provider]  amiodarone (PACERONE) 200 MG tablet TAKE 1 TABLET BY MOUTH EVERY DAY Patient taking differently: Take 200 mg by mouth daily. 03/31/20   Chilton Si, MD  amLODipine (NORVASC) 2.5 MG tablet Take 1 tablet (2.5 mg total) by mouth daily. 06/24/20 09/22/20  Chilton Si, MD  atorvastatin (LIPITOR) 40 MG tablet TAKE 1 TABLET (40 MG TOTAL) BY MOUTH DAILY AT 6 PM. 06/24/20   Chilton Si, MD  bismuth subsalicylate (PEPTO-BISMOL) 262 MG chewable tablet Chew 2 tablets (524 mg total) by mouth as needed. 07/02/20   Barbette Merino, NP  carvedilol (COREG) 25 MG tablet Take 1 tablet (25 mg total) by mouth 2 (two) times daily. 03/11/20 06/09/20  Chilton Si, MD  citalopram (CELEXA) 40 MG tablet Take 1 tablet (40 mg total) by mouth daily. 06/11/20   Zena Amos, MD  ELIQUIS 5 MG TABS tablet TAKE 1 TABLET BY MOUTH TWICE A DAY 06/24/20   Ronney Asters, NP  gabapentin (NEURONTIN) 300 MG capsule Take 3 capsules (900 mg total) by mouth at  bedtime. Patient taking differently: Take 300-600 mg by mouth See admin instructions. Takes 2 capsule in the morning and 1 capsule at night 03/31/20 03/31/21  Barbette Merino, NP  hydrALAZINE (APRESOLINE) 50 MG tablet Take 50 mg by mouth as directed. TAKE 1/2 TABLET AS NEEDED FOR BLOOD PRESSURE ABOVE 150    [provider]  traZODone (DESYREL) 50 MG tablet Take half to whole tablet at bedtime for sleep. 06/11/20   Zena Amos, MD    Family History    Family History  Problem Relation Age of Onset  . Diabetes Mother   . Diabetes Sister   . Mental illness Sister   . Mental illness Brother    He indicated that his mother is deceased. He indicated that his father is alive. He indicated that all of his four sisters  are alive. He indicated that his brother is alive. He indicated that his maternal grandmother is deceased. He indicated that his maternal grandfather is deceased. He indicated that his paternal grandmother is deceased. He indicated that his paternal grandfather is deceased.  Social History    Social History   Socioeconomic History  . Marital status: Single    Spouse name: Not on file  . Number of children: Not on file  . Years of education: Not on file  . Highest education level: Not on file  Occupational History  . Not on file  Tobacco Use  . Smoking status: Former Smoker    Quit date: 12/21/2018    Years since quitting: 1.6  . Smokeless tobacco: Never Used  Vaping Use  . Vaping Use: Never used  Substance and Sexual Activity  . Alcohol use: Not Currently  . Drug use: Never  . Sexual activity: Not on file  Other Topics Concern  . Not on file  Social History Narrative  . Not on file   Social Determinants of Health   Financial Resource Strain: Medium Risk  . Difficulty of Paying Living Expenses: Somewhat hard  Food Insecurity: No Food Insecurity  . Worried About Programme researcher, broadcasting/film/video in the Last Year: Never true  . Ran Out of Food in the Last Year: Never  true  Transportation Needs: No Transportation Needs  . Lack of Transportation (Medical): No  . Lack of Transportation (Non-Medical): No  Physical Activity: Not on file  Stress: Not on file  Social Connections: Not on file  Intimate Partner Violence: Not on file     Review of Systems    General:  No chills, fever, night sweats or weight changes.  Cardiovascular:  No chest pain, dyspnea on exertion, edema, orthopnea, palpitations, paroxysmal nocturnal dyspnea. Dermatological: No rash, lesions/masses Respiratory: No cough, dyspnea Urologic: No hematuria, dysuria Abdominal:   No nausea, vomiting, diarrhea, bright red blood per rectum, melena, or hematemesis Neurologic:  No visual changes, wkns, changes in mental status. All other systems reviewed and are otherwise negative except as noted above.  Physical Exam    VS:  BP 122/76 (BP Location: Left Arm, Patient Position: Sitting, Cuff Size: Normal)   Pulse (!) 52   Ht 5\' 7"  (1.702 m)   Wt 155 lb 3.2 oz (70.4 kg)   BMI 24.31 kg/m  , BMI Body mass index is 24.31 kg/m. GEN: Well nourished, well developed, in no acute distress. HEENT: normal. Neck: Supple, no JVD, carotid bruits, or masses. Cardiac: RRR, no murmurs, rubs, or gallops. No clubbing, cyanosis, edema.  Radials/DP/PT 2+ and equal bilaterally.  Respiratory:  Respirations regular and unlabored, clear to auscultation bilaterally. GI: Soft, nontender, nondistended, BS + x 4. MS: no deformity or atrophy. Skin: warm and dry, no rash. Neuro:  Strength and sensation are intact. Psych: Normal affect.  Accessory Clinical Findings    Recent Labs: 06/20/2020: ALT 27; B Natriuretic Peptide 42.8; BUN 13; Creatinine, [place] 1.30; Hemoglobin 11.5; Platelets 220; Potassium 4.7; Sodium 136 06/24/2020: TSH 11.800   Recent Lipid Panel    Component Value Date/Time   CHOL 94 (L) 08/15/2019 0925   TRIG 93 08/15/2019 0925   HDL 41 08/15/2019 0925   CHOLHDL 2.3 08/15/2019 0925   CHOLHDL 2.8  05/07/2019 0823   VLDL 17 05/07/2019 0823   LDLCALC 35 08/15/2019 0925    ECG personally reviewed by me today-none today.   Echocardiogram 05/07/2019 IMPRESSIONS    1. Left ventricular ejection fraction,  by estimation, is 45 to 50%. The  left ventricle has mildly decreased function. The left ventricle  demonstrates global hypokinesis. Left ventricular diastolic function could  not be evaluated.  2. Right ventricular systolic function is low normal. The right  ventricular size is normal. There is mildly elevated pulmonary artery  systolic pressure.  3. The mitral valve is grossly normal. Mild mitral valve regurgitation.  4. Tricuspid valve regurgitation is mild to moderate.  5. The aortic valve is tricuspid. Aortic valve regurgitation is mild. No  aortic stenosis is present.  6. There is mild dilatation of the ascending aorta measuring 39 mm.  7. The inferior vena cava is dilated in size with >50% respiratory  variability, suggesting right atrial pressure of 8 mmHg.   TEE 05/10/2019 IMPRESSIONS    1. Moderate to severe global reduction in LV systolic function; mild AI  and MR; mild LAE; spontaneous contrast in LAA but no thrombus; mild RAE  and RVE; moderate TR.  2. Left ventricular ejection fraction, by estimation, is 30 to 35%. The  left ventricle has moderately decreased function. The left ventricle  demonstrates global hypokinesis. Left ventricular diastolic function could  not be evaluated.  3. Right ventricular systolic function is normal. The right ventricular  size is mildly enlarged.  4. Left atrial size was mildly dilated. No left atrial/left atrial  appendage thrombus was detected.  5. Right atrial size was mildly dilated.  6. The mitral valve is normal in structure and function. Mild mitral  valve regurgitation.  7. Tricuspid valve regurgitation is moderate.  8. The aortic valve is tricuspid. Aortic valve regurgitation is mild.  9. There is  mild (Grade II) plaque involving the descending aorta.   Conclusion(s)/Recomendation(s): No LA/LAA thrombus identified. Successful  cardioversion performed with restoration of normal sinus rhythm.  Nuclear stress test 05/23/2019  There was no ST segment deviation noted during stress.  Study was not gated due to atrial fibrillation  Defect 1: There is a small fixed defect of mild severity present in the apex location.  Defect 2: There is a small reversible defect of mild severity present in the basal inferior, mid inferior and apical inferior location.  Findings consistent with ischemia and prior myocardial infarction.  This is a low risk study given small area of ischemia.  Fixed defect at apex, suggests infarct. Reversible inferior defect, suggests ischemia.  Cardiac catheterization 08/30/2019  There is severe left ventricular systolic dysfunction.  The left ventricular ejection fraction is less than 25% by visual estimate.  LV end diastolic pressure is normal. LVEDP 12 mm Hg.  There is no aortic valve stenosis.  No significant CAD.  Severe vasospasm of right radial artery, even with 4 Fr catheter. Would not use the right radial artery if cath was needed in the future.  Medical therapy for nonischemic cardiomyopathy.  Resume Eliquis tomorrow morning.   Echocardiogram 03/26/2020  IMPRESSIONS    1. Left ventricular ejection fraction, by estimation, is 60 to 65%. Left  ventricular ejection fraction by 3D volume is 61 %. The left ventricle has  normal function. The left ventricle has no regional wall motion  abnormalities. Left ventricular diastolic  parameters were normal.  2. Right ventricular systolic function is normal. The right ventricular  size is normal. There is normal pulmonary artery systolic pressure.  3. The mitral valve is normal in structure. No evidence of mitral valve  regurgitation. No evidence of mitral stenosis.  4. The aortic valve is  normal in structure. Aortic  valve regurgitation is  mild. No aortic stenosis is present. Aortic regurgitation PHT measures 458  msec.  5. Aortic dilatation noted. There is mild dilatation of the ascending  aorta, measuring 40 mm.  6. The inferior vena cava is normal in size with greater than 50%  respiratory variability, suggesting right atrial pressure of 3 mmHg.   Comparison(s): Prior images reviewed side by side. The left ventricular  function has improved.  Assessment & Plan   1. Essential hypertension-BP today  122/76.  He brings blood pressure log today which shows much better controlled blood pressures.  He continues to have occasional episodes of hypotension.  He reports that he is still taking his medication when his blood pressures are lower.  I have instructed him to only take his hydralazine for blood pressure greater than 150 systolic.  We reviewed his amlodipine dosing and I will have him hold his amlodipine for systolic less than 100. Continue carvedilol, amlodipine, hydralazine as needed heart healthy low-sodium diet Increase physical activity as tolerated   Nonischemic cardiomyopathy- reports his breathing is somewhat better.  Cardiac catheterization 08/30/2019 showed an LVEF of 25%. Echocardiogram 03/26/2020 showed improved LVEF of 60-65%, and mild aortic valve regurgitation. Continue  carvedilol Heart healthy low-sodium diet-salty 6 given Increase physical activity as tolerated  Atrial fibrillation with RVR-heart rate [id number]/21showedatrial fibrillation 90 bpm. TEE with DCCV x2 on 05/10/2019. [place] and cardioversion on 01/2019. Then relocated to [facility] and ran out of his medications.Hecontinues to becardiac unaware and feels his breathing is athis baseline.  Follows with Dr. Johney Frame who has discussed ablation procedure and wished to continue with amiodarone therapy at this time. Continue amiodarone,  carvedilol Continue apixaban 5 mg tablet twice daily Avoid triggers caffeine, chocolate, EtOH etc. Order nuclear stress test to evaluate for ischemia.  Coronary artery disease-reports chronic type chest pressure. Underwent cardiac catheterization 08/30/2019 which showed no significant coronary artery disease and an LVEF of 25%. Medical therapy for nonischemic cardiomyopathy was recommended. Details above.Nuclear stress test 05/23/2019 showed 2 defects. One being reversible and inferior in nature. Continue carvedilol, amlodipine Heart healthy low-sodium diet-salty 6 given Increase physical activity as tolerated  Aortic atherosclerosis-goal LDL less than 70.08/15/2019: Cholesterol, Total 94; HDL 41; LDL Chol Calc (NIH) 35; Triglycerides 93 Continue atorvastatin 40 mg daily Heart healthy low-sodium high-fiber diet Increase physical activity as tolerated  Right shoulder pain- through full range of motion. No trauma, falls, work-related injuries. Orthopedics  wants to treat conservatively.  Disposition: Follow-up with Dr. Duke Salvia in 4-6 month.   [name]. Daryn Hicks NP-C    07/28/2020, 1:56 PM [facility] [address] Office [phone] Fax [phone]  Notice: This dictation was prepared with Dragon dictation along with smaller phrase technology. Any transcriptional errors that result from this process are unintentional and may not be corrected upon review.  I spent 15 minutes examining this patient, reviewing medications, and using patient centered shared decision making involving her cardiac care.  Prior to her visit I spent greater than 20 minutes reviewing her past medical history,  medications, and prior cardiac tests.

## 2020-07-28 ENCOUNTER — Encounter: Payer: Self-pay | Admitting: General Practice

## 2020-07-28 ENCOUNTER — Ambulatory Visit (INDEPENDENT_AMBULATORY_CARE_PROVIDER_SITE_OTHER): Payer: Medicaid Other | Admitting: General Practice

## 2020-07-28 ENCOUNTER — Other Ambulatory Visit: Payer: Self-pay

## 2020-07-28 VITALS — BP 122/76 | HR 52 | Ht 67.0 in | Wt 155.2 lb

## 2020-07-28 DIAGNOSIS — M25511 Pain in right shoulder: Secondary | ICD-10-CM

## 2020-07-28 DIAGNOSIS — I10 Essential (primary) hypertension: Secondary | ICD-10-CM

## 2020-07-28 DIAGNOSIS — I358 Other nonrheumatic aortic valve disorders: Secondary | ICD-10-CM

## 2020-07-28 DIAGNOSIS — I428 Other cardiomyopathies: Secondary | ICD-10-CM

## 2020-07-28 DIAGNOSIS — I4819 Other persistent atrial fibrillation: Secondary | ICD-10-CM | POA: Diagnosis not present

## 2020-07-28 DIAGNOSIS — I2510 Atherosclerotic heart disease of native coronary artery without angina pectoris: Secondary | ICD-10-CM | POA: Diagnosis not present

## 2020-07-28 NOTE — Patient Instructions (Signed)
Medication Instructions:  The current medical regimen is effective;  continue present plan and medications as directed. Please refer to the Current Medication list given to you today.  *If you need a refill on your cardiac medications before your next appointment, please call your pharmacy*  Lab Work:   Testing/Procedures:  NONE    NONE  Special Instructions PLEASE INCREASE PHYSICAL ACTIVITY AS TOLERATED-PLEASE DO LIGHT LEG EXERCISES AND LIGHT STRETCHING 3 TIMES A DAY FOR 5 MINUTES AT A TIME ????? ??????? ?????????? ???? ????? ??????????-????? ????? ???????? ??????? ????????? ? ?? ????? 5 ??????? ???? ????? 3 ??? ????? ????????? ?????????? Kr?pay? ??r?rika gatividhil?'? sahan? r?pam? ba?h?'unuh?s-kr?pay? halk? khu???k? vy?y?ma garnuh?s ra ?ka pa?akam? 5 min??ak? l?gi dinam? 3 pa?aka halk? s?r?ci?a garnuh?s.  TAKE BLOOD PRESSURE IF <100 SYSTOLIC(TOP NEMBER) PLEASE HOLD YOUR AMLODIPINE ???? ?????? ???????? ??? <100 ????????? (????? ??????) ????? ????? ?????????? ????? ????????? Bla?a pr?sara linuh?s yadi <100 sis??lika (??r?a n?mbara) kr?pay? ?phn? amal??ip?'ina h?l?a garnuh?s  Follow-Up: Your next appointment:  4-6 month(s) In Person with Chilton Si, MD OR IF UNAVAILABLE JESSE CLEAVER, FNP-C ??????? ????? ????????????: 4 ?????(???) Chilton Si, MD ?? ??? ?????? ??? ??? JESSE CLEAVER, FNP-C ??? ????????? Tap?'??k? ark? ap?'in?am?n?a: 4-6 Mahin?(har?) Chilton Si, MD v? Marguerita Merles upalabdha chaina bhan? JESSE CLEAVER, FNP-C sam?ga vyaktim?  At [facility], you and your health needs are our priority.  As part of our continuing mission to provide you with exceptional heart care, we have created designated Provider Care Teams.  These Care Teams include your primary Cardiologist (physician) and Advanced Practice Providers (APPs -  Physician Assistants and Nurse Practitioners) who all work together to provide you with the care you need, when you need it.

## 2020-07-29 ENCOUNTER — Encounter: Payer: Self-pay | Admitting: Neurology

## 2020-07-29 ENCOUNTER — Ambulatory Visit: Payer: Medicaid Other | Admitting: Neurology

## 2020-07-29 VITALS — BP 134/82 | HR 53 | Ht 67.0 in | Wt 156.5 lb

## 2020-07-29 DIAGNOSIS — M25551 Pain in right hip: Secondary | ICD-10-CM | POA: Insufficient documentation

## 2020-07-29 DIAGNOSIS — R32 Unspecified urinary incontinence: Secondary | ICD-10-CM | POA: Diagnosis not present

## 2020-07-29 DIAGNOSIS — M542 Cervicalgia: Secondary | ICD-10-CM | POA: Diagnosis not present

## 2020-07-29 DIAGNOSIS — R269 Unspecified abnormalities of gait and mobility: Secondary | ICD-10-CM

## 2020-07-29 DIAGNOSIS — G8929 Other chronic pain: Secondary | ICD-10-CM | POA: Insufficient documentation

## 2020-07-29 DIAGNOSIS — M5441 Lumbago with sciatica, right side: Secondary | ICD-10-CM

## 2020-07-29 MED ORDER — DULOXETINE HCL 60 MG PO CPEP: 60.0000 mg | ORAL_CAPSULE | Freq: Every day | ORAL | 12 refills | Status: DC

## 2020-07-29 NOTE — Progress Notes (Signed)
Chief Complaint  Patient presents with  . New Patient (Initial Visit)    He is here with an interpreter from [facility]. Reports intermittent dizzy spells. Symptoms worsen with positional changes. Orthostatic Vitals: Lying: 134/82, 53, Sitting: 136/85, 54, Standing: 130/80, 52, Standing x 3: 119/79, 53.      ASSESSMENT AND PLAN  Seth Snyder is a 60 y.o. male   Gait abnormality, worsening functional status, urinary incontinence  Hyperreflexia of bilateral patellar reflex, MRI of cervical spine to rule out cervical spondylitic myelopathy Worsening low back pain, radiating pain to right lower extremity, right groin pain,  X-ray of right hip to rule out right hip pathology, differentiation diagnoses also include right lumbar radiculopathy  MRI of lumbar spine  EMG nerve conduction study  Cymbalta 60 mg daily    DIAGNOSTIC DATA (LABS, IMAGING, TESTING) - I reviewed patient records, labs, notes, testing and imaging myself where available.  MRI of brain on June 21 2020.  1. No acute intracranial abnormality. 2. Multifocal hyperintense T2-weighted signal within the white matter, most commonly seen in the setting of chronic microvascular Ischemia.  ECHO on Jan 5th 2022. Left ventricular ejection fraction, by estimation, is 60 to 65%. Left ventricular ejection fraction by 3D volume is 61 %. The left ventricle has normal function. The left ventricle has no regional wall motion abnormalities. Left ventricular diastolic parameters were normal. 2. Right ventricular systolic function is normal. The right ventricular size is normal. There is normal pulmonary artery systolic pressure. 3. The mitral valve is normal in structure. No evidence of mitral valve regurgitation. No evidence of mitral stenosis. 4. The aortic valve is normal in structure. Aortic valve regurgitation is mild. No aortic stenosis is present. Aortic regurgitation PHT measures 458 msec. 5. Aortic dilatation noted. There  is mild dilatation of the ascending aorta, measuring 40 mm. 6. The inferior vena cava is normal in size with greater than 50% respiratory variability, suggesting right atrial pressure of 3 mmHg.  HISTORICAL  Seth Snyder is a 60 year old male, seen in request by his primary care nurse practitioner Barbette Merino, for evaluation of constellation of complaints, emphasized on right low back pain, radiating pain to right lower extremity, declining functional status, gait abnormality, initial evaluation was on Jul 29, 2020, he speaks limited [place], native of [place].  I reviewed and summarized the referring note. PMHx HTN HLD. Atrial fibrillation, on eliquis 5mg  bid.  [place] than [place], used to work at [place] job, last job was in December 2020, he complains worsening depression, pain, has to went on disability.  He took his documented daily blood pressure measurement at today's visit, it was in the range of 70/50, up to 150/100  He reported poor functional status at home, came in with a walker, complains of excruciating pain from right gluteus region radiating to right lower extremity, and right groin area, worsening with weightbearing, frequent lightheadedness, dizziness, lower extremity give out underneath him, also complains of a year history of worsening urinary incontinence, urgency,  He reported history of chronic neck pain, now complaints of increased gait abnormality, incontinence, radiating pain to bilateral shoulder  Personally reviewed MRI of the brain in April 2022 showed multiple supratentorium small vessel disease no acute abnormality  Echocardiogram in January 2022 showed normal ejection fraction 60 to 65%, no other significant abnormality  He also complains of worsening depression because diffuse body moderate to severe pain, constant, difficult to function, difficulty sleeping, taking  trazodone 50 mg daily,  REVIEW OF SYSTEMS:   Full 14 system review of systems performed and notable only for as above All other review of systems were negative.  PHYSICAL EXAM:   Vitals:   07/29/20 1318  BP: 134/82  Pulse: (!) 53  Weight: 156 lb 8 oz (71 kg)  Height: 5\' 7"  (1.702 m)   Not recorded     Body mass index is 24.51 kg/m.  PHYSICAL EXAMNIATION:  Gen: NAD, conversant, well nourised, well groomed                     Cardiovascular: Regular rate rhythm, no peripheral edema, warm, nontender. Eyes: Conjunctivae clear without exudates or hemorrhage Neck: Supple, no carotid bruits. Pulmonary: Clear to auscultation bilaterally   NEUROLOGICAL EXAM:  MENTAL STATUS: Speech:    Speech is normal; fluent and spontaneous with normal comprehension.  Cognition:     Difficult to evaluate due to language barrier, depressed looking middle-aged male  CRANIAL NERVES: CN II: Visual fields are full to confrontation. Pupils are round equal and briskly reactive to light. CN III, IV, VI: extraocular movement are normal. No ptosis. CN V: Facial sensation is intact to light touch CN VII: Face is symmetric with normal eye closure  CN VIII: Hearing is normal to causal conversation. CN IX, X: Phonation is normal. CN XI: Head turning and shoulder shrug are intact  MOTOR: Motor examination is limited mainly due to right hip pain, felt there was no significant bilateral upper extremity, and lower extremity proximal and distal weakness,  REFLEXES: Reflexes are 2+ and symmetric at the biceps, triceps, 3/3 knees, and ankles. Plantar responses are extensor bilaterally  SENSORY: Mildly decreased light touch vibratory sensation, pinprick to left ankle level  COORDINATION: There is no trunk or limb dysmetria noted.  GAIT/STANCE: He needs push-up to get up from seated position, limp, cautious, mildly unsteady  ALLERGIES: Allergies  Allergen Reactions  . Entresto [Sacubitril-Valsartan]     Abnormal kidney functions     HOME  MEDICATIONS: Current Outpatient Medications  Medication Sig Dispense Refill  . acetaminophen (TYLENOL) 500 MG tablet Take 1,000 mg by mouth daily as needed for mild pain.    amiodarone (PACERONE) 200 MG tablet TAKE 1 TABLET BY MOUTH EVERY DAY (Patient taking differently: Take 200 mg by mouth daily.) 90 tablet 1  . amLODipine (NORVASC) 2.5 MG tablet Take 1 tablet (2.5 mg total) by mouth daily. 90 tablet 3  . atorvastatin (LIPITOR) 40 MG tablet TAKE 1 TABLET (40 MG TOTAL) BY MOUTH DAILY AT 6 PM. 90 tablet 3  . bismuth subsalicylate (PEPTO-BISMOL) 262 MG chewable tablet Chew 2 tablets (524 mg total) by mouth as needed. 30 tablet 0  . citalopram (CELEXA) 40 MG tablet Take 1 tablet (40 mg total) by mouth daily. 90 tablet 1  . ELIQUIS 5 MG TABS tablet TAKE 1 TABLET BY MOUTH TWICE A DAY 180 tablet 1  . gabapentin (NEURONTIN) 300 MG capsule Take 3 capsules (900 mg total) by mouth at bedtime. (Patient taking differently: Take 300-600 mg by mouth See admin instructions. Takes 2 capsule in the morning and 1 capsule at night) 90 capsule 11  . hydrALAZINE (APRESOLINE) 50 MG tablet Take 50 mg by mouth as directed. TAKE 1/2 TABLET AS NEEDED FOR BLOOD PRESSURE ABOVE 150    . traZODone (DESYREL) 50 MG tablet Take half to whole tablet at bedtime for sleep. 90 tablet 1  . carvedilol (COREG) 25 MG tablet Take 1  tablet (25 mg total) by mouth 2 (two) times daily. 180 tablet 3   No current facility-administered medications for this visit.    PAST MEDICAL HISTORY: Past Medical History:  Diagnosis Date  . Anemia   . Atrial fibrillation (HCC)   . Dizziness   . Hypertension   . Low back pain     PAST SURGICAL HISTORY: Past Surgical History:  Procedure Laterality Date  . CARDIOVERSION N/A 05/10/2019   Procedure: CARDIOVERSION;  Surgeon: Lewayne Bunting, MD;  Location: [facility] ENDOSCOPY;  Service: Cardiovascular;  Laterality: N/A;  . COLONOSCOPY  07/12/2013  . LEFT HEART CATH AND CORONARY ANGIOGRAPHY N/A  08/30/2019   Procedure: LEFT HEART CATH AND CORONARY ANGIOGRAPHY;  Surgeon: Corky Crafts, MD;  Location: [facility] INVASIVE CV LAB;  Service: Cardiovascular;  Laterality: N/A;  . TEE WITHOUT CARDIOVERSION N/A 05/10/2019   Procedure: TRANSESOPHAGEAL ECHOCARDIOGRAM (TEE);  Surgeon: Lewayne Bunting, MD;  Location: [facility] ENDOSCOPY;  Service: Cardiovascular;  Laterality: N/A;    FAMILY HISTORY: Family History  Problem Relation Age of Onset  . Diabetes Mother   . Other Father        unsure of medical history  . Diabetes Sister   . Mental illness Sister   . Mental illness Brother     SOCIAL HISTORY: Social History   Socioeconomic History  . Marital status: Single    Spouse name: Not on file  . Number of children: 1  . Years of education: 8th grade  . Highest education level: Not on file  Occupational History  . Occupation: Disabled  Tobacco Use  . Smoking status: Former Smoker    Quit date: 12/21/2018    Years since quitting: 1.6  . Smokeless tobacco: Never Used  Vaping Use  . Vaping Use: Never used  Substance and Sexual Activity  . Alcohol use: Not Currently  . Drug use: Never  . Sexual activity: Not on file  Other Topics Concern  . Not on file  Social History Narrative   Right-handed.   Lives at home with his daughter and son-in-law.   No daily use of caffeine.      Social Determinants of Health   Financial Resource Strain: Medium Risk  . Difficulty of Paying Living Expenses: Somewhat hard  Food Insecurity: No Food Insecurity  . Worried About Programme researcher, broadcasting/film/video in the Last Year: Never true  . Ran Out of Food in the Last Year: Never true  Transportation Needs: No Transportation Needs  . Lack of Transportation (Medical): No  . Lack of Transportation (Non-Medical): No  Physical Activity: Not on file  Stress: Not on file  Social Connections: Not on file  Intimate Partner Violence: Not on file      Levert Feinstein, M.D. Ph.D.  [facility] [address] [place], [place] [address] Ph: [phone] Fax: [phone]  CC:  Barbette Merino, NP [address] [place],  [place] [address]  Barbette Merino, NP

## 2020-07-30 ENCOUNTER — Telehealth: Payer: Self-pay | Admitting: Neurology

## 2020-07-30 NOTE — Telephone Encounter (Signed)
[facility] pending faxed notes.  °

## 2020-07-31 NOTE — Telephone Encounter (Signed)
Mo with [facility] transportation called stating since the MRI's are for [facility] and not with [facility] she wanted to know if our office is paying for his transportation. I informed her no we are not paying for his transportation. She stated he wanted to go to [facility] today I informed her he can't just go there today because its not approved by his insurance yet it is still pending.. I informed her if or when it gets approved I will switch the sight location to [facility].   [facility]  ph # [phone].

## 2020-08-04 ENCOUNTER — Other Ambulatory Visit (HOSPITAL_COMMUNITY)
Admission: RE | Admit: 2020-08-04 | Discharge: 2020-08-04 | Disposition: A | Discharge status: Home / Self Care | Payer: Medicaid Other | Source: Ambulatory Visit | Attending: Pulmonary Disease | Admitting: Pulmonary Disease

## 2020-08-04 DIAGNOSIS — Z01812 Encounter for preprocedural laboratory examination: Secondary | ICD-10-CM | POA: Insufficient documentation

## 2020-08-04 DIAGNOSIS — Z20822 Contact with and (suspected) exposure to covid-19: Secondary | ICD-10-CM | POA: Insufficient documentation

## 2020-08-04 NOTE — Telephone Encounter (Signed)
Please connect me Peer to peer review for MRI lumbar

## 2020-08-04 NOTE — Telephone Encounter (Signed)
I received a fax from [employer] stating that patient's MRI Cervical Spine was approved. PA #[id number] (07/30/20- 09/28/20), but the MRI Lumbar Spine was not approved. I called [employer] [phone], option 4) and spoke with Deja. She states that the MRI Lumbar Spine is needing a peer-to-peer by calling [facility]. I changed the site location to [facility] per Emily's previous note. Reference #[id number].

## 2020-08-05 LAB — SARS CORONAVIRUS 2 (TAT 6-24 HRS): SARS Coronavirus 2: NEGATIVE

## 2020-08-05 NOTE — Telephone Encounter (Signed)
I called scheduling at [facility] [phone]) and spoke with Ethelene Browns to schedule patient's MRIs (brain & cervical spine). Ethelene Browns was able to schedule those for June 3rd at 2:00. Patient will need to arrive to [facility] by 1:30. DG hip is a walk-in procedure and can be done the same day of the MRIs.

## 2020-08-05 NOTE — Telephone Encounter (Signed)
MRI lumbar: [id number].

## 2020-08-05 NOTE — Telephone Encounter (Signed)
I called [facility] Transportation [phone]) and spoke with Mo to set up transportation for patient's MRIs. Pick-up is around 12:45. She states they will call patient to let him know.

## 2020-08-06 ENCOUNTER — Other Ambulatory Visit: Payer: Self-pay

## 2020-08-06 ENCOUNTER — Ambulatory Visit (INDEPENDENT_AMBULATORY_CARE_PROVIDER_SITE_OTHER): Payer: Medicaid Other | Admitting: Acute Care

## 2020-08-06 ENCOUNTER — Ambulatory Visit (INDEPENDENT_AMBULATORY_CARE_PROVIDER_SITE_OTHER): Payer: Medicaid Other | Admitting: Pulmonary Disease

## 2020-08-06 ENCOUNTER — Encounter: Payer: Self-pay | Admitting: Acute Care

## 2020-08-06 VITALS — BP 128/84 | HR 54 | Temp 98.6°F | Ht 67.0 in | Wt 153.0 lb

## 2020-08-06 DIAGNOSIS — R0609 Other forms of dyspnea: Secondary | ICD-10-CM

## 2020-08-06 DIAGNOSIS — R0600 Dyspnea, unspecified: Secondary | ICD-10-CM

## 2020-08-06 DIAGNOSIS — J449 Chronic obstructive pulmonary disease, unspecified: Secondary | ICD-10-CM

## 2020-08-06 LAB — PULMONARY FUNCTION TEST
DL/VA % pred: 78 %
DL/VA: 3.47 ml/min/mmHg/L
DLCO cor % pred: 74 %
DLCO cor: 20.98 ml/min/mmHg
DLCO unc % pred: 74 %
DLCO unc: 20.98 ml/min/mmHg
FEF 25-75 Post: 1.02 L/sec
FEF 25-75 Pre: 1.03 L/sec
FEF2575-%Change-Post: -1 %
FEF2575-%Pred-Post: 37 %
FEF2575-%Pred-Pre: 37 %
FEV1-%Change-Post: 0 %
FEV1-%Pred-Post: 60 %
FEV1-%Pred-Pre: 60 %
FEV1-Post: 1.86 L
FEV1-Pre: 1.86 L
FEV1FVC-%Change-Post: 0 %
FEV1FVC-%Pred-Pre: 76 %
FEV6-%Change-Post: 0 %
FEV6-Post: 3.05 L
FEV6-Pre: 3.06 L
FEV6FVC-%Change-Post: 0 %
FVC-%Change-Post: 0 %
FVC-%Pred-Post: 78 %
FVC-%Pred-Pre: 78 %
FVC-Post: 3.07 L
FVC-Pre: 3.06 L
Post FEV1/FVC ratio: 61 %
Post FEV6/FVC ratio: 99 %
Pre FEV1/FVC ratio: 61 %
Pre FEV6/FVC Ratio: 100 %
RV % pred: 120 %
RV: 2.46 L
TLC % pred: 94 %
TLC: 6.03 L

## 2020-08-06 MED ORDER — ANORO ELLIPTA 62.5-25 MCG/INH IN AEPB: 1.0000 | INHALATION_SPRAY | Freq: Every day | RESPIRATORY_TRACT | 0 refills | Status: DC

## 2020-08-06 MED ORDER — ANORO ELLIPTA 62.5-25 MCG/INH IN AEPB: 1.0000 | INHALATION_SPRAY | Freq: Every day | RESPIRATORY_TRACT | 5 refills | Status: DC

## 2020-08-06 NOTE — Progress Notes (Signed)
History of Present Illness Seth Snyder is a 60 y.o. male former smoker seen 06/26/2020 for consult for dyspnea with Dr. Isaiah Serge . He has history of atrial fibrillation on Eliquis, major depression, nonischemic cardiomyopathy. Recommendations after that consult  were for PFT's to evaluate for COPD.   08/06/2020  Pt. Presents for follow up after PFT's . I am speaking to him through an interpreter. He states he does have shortness of breath. He has rare to  occasional wheezing. Pt. Was unable to complete the post BD portion of the PFT's. He also relays through the interpreter that he has a weak voice.I have asked him to folow up with his PCP.  He is in NAD. Sats are 98% 0n RA. PFT's show moderate COPD . We will start patient on an inhaler to see if he has any improvement. Pt. Is on Medicaid. We will start with Anoro samples, and we can increase to dual therapy if no improvement on Anoro.   Test Results:         CBC Latest Ref Rng & Units 06/20/2020 05/21/2020 05/14/2020  WBC 4.0 - 10.5 K/uL 6.1 5.6 7.4  Hemoglobin 13.0 - 17.0 g/dL 11.5(L) 11.2(L) 11.5(L)  Hematocrit 39.0 - 52.0 % 36.4(L) 35.2(L) 36.0(L)  Platelets 150 - 400 K/uL 220 173 205    BMP Latest Ref Rng & Units 06/20/2020 05/21/2020 05/14/2020  Glucose 70 - 99 mg/dL 86 093(G) 94  BUN 6 - 20 mg/dL 13 12 14   Creatinine 0.61 - 1.24 mg/dL ) 1.82(X) 9.37(J)  BUN/Creat Ratio 9 - 20 - - -  Sodium 135 - 145 mmol/L 136 136 142  Potassium 3.5 - 5.1 mmol/L 4.7 4.2 4.6  Chloride 98 - 111 mmol/L 106 106 110  CO2 22 - 32 mmol/L 22 22 21(L)  Calcium 8.9 - 10.3 mg/dL 9.0 9.0 6.96(V)    BNP    Component Value Date/Time   BNP 42.8 06/20/2020 1816    ProBNP No results found for: PROBNP  PFT    Component Value Date/Time   FEV1PRE 1.86 08/06/2020 1352   FEV1POST 1.86 08/06/2020 1352   FVCPRE 3.06 08/06/2020 1352   FVCPOST 3.07 08/06/2020 1352   TLC 6.03 08/06/2020 1352   DLCOUNC 20.98 08/06/2020 1352   PREFEV1FVCRT 61  08/06/2020 1352   PSTFEV1FVCRT 61 08/06/2020 1352    No results found.   Past medical hx Past Medical History:  Diagnosis Date  . Anemia   . Atrial fibrillation (HCC)   . Dizziness   . Hypertension   . Low back pain      Social History   Tobacco Use  . Smoking status: Former Smoker    Quit date: 12/21/2018    Years since quitting: 1.6  . Smokeless tobacco: Never Used  Vaping Use  . Vaping Use: Never used  Substance Use Topics  . Alcohol use: Not Currently  . Drug use: Never    Mr.Seth Snyder reports that he quit smoking about 19 months ago. He has never used smokeless tobacco. He reports previous alcohol use. He reports that he does not use drugs.  Tobacco Cessation: Former smoker quit 2021.  Past surgical hx, Family hx, Social hx all reviewed.  Current Outpatient Medications on File Prior to Visit  Medication Sig  . acetaminophen (TYLENOL) 500 MG tablet Take 1,000 mg by mouth daily as needed for mild pain.  2022 amiodarone (PACERONE) 200 MG tablet TAKE 1 TABLET BY MOUTH EVERY DAY (Patient taking differently: Take 200 mg by  mouth daily.)  . amLODipine (NORVASC) 2.5 MG tablet Take 1 tablet (2.5 mg total) by mouth daily.  Marland Kitchen atorvastatin (LIPITOR) 40 MG tablet TAKE 1 TABLET (40 MG TOTAL) BY MOUTH DAILY AT 6 PM.  . bismuth subsalicylate (PEPTO-BISMOL) 262 MG chewable tablet Chew 2 tablets (524 mg total) by mouth as needed.  . citalopram (CELEXA) 40 MG tablet Take 1 tablet (40 mg total) by mouth daily.  . DULoxetine (CYMBALTA) 60 MG capsule Take 1 capsule (60 mg total) by mouth daily.  Marland Kitchen ELIQUIS 5 MG TABS tablet TAKE 1 TABLET BY MOUTH TWICE A DAY  . gabapentin (NEURONTIN) 300 MG capsule Take 3 capsules (900 mg total) by mouth at bedtime. (Patient taking differently: Take 300-600 mg by mouth See admin instructions. Takes 2 capsule in the morning and 1 capsule at night)  . hydrALAZINE (APRESOLINE) 50 MG tablet Take 50 mg by mouth as directed. TAKE 1/2 TABLET AS NEEDED FOR BLOOD  PRESSURE ABOVE 150  . traZODone (DESYREL) 50 MG tablet Take half to whole tablet at bedtime for sleep.   No current facility-administered medications on file prior to visit.     Allergies  Allergen Reactions  . Entresto [Sacubitril-Valsartan]     Abnormal kidney functions     Review Of Systems:  Constitutional:   No  weight loss, night sweats,  Fevers, chills, fatigue, or  lassitude.  HEENT:   No headaches,  Difficulty swallowing,  Tooth/dental problems, or  Sore throat,                No sneezing, itching, ear ache, nasal congestion, post nasal drip,   CV:  No chest pain,  Orthopnea, PND, swelling in lower extremities, anasarca, dizziness, palpitations, syncope.   GI  No heartburn, indigestion, abdominal pain, nausea, vomiting, diarrhea, change in bowel habits, loss of appetite, bloody stools.   Resp: + shortness of breath with exertion or at rest.  Occasional  excess mucus, + productive cough,  No non-productive cough,  No coughing up of blood.  No change in color of mucus.  Occasional  wheezing.  No chest wall deformity  Skin: no rash or lesions.  GU: no dysuria, change in color of urine, no urgency or frequency.  No flank pain, no hematuria   MS:  No joint pain or swelling.  No decreased range of motion.  No back pain.  Psych:  No change in mood or affect. No depression or anxiety.  No memory loss.   Vital Signs BP 128/84 (BP Location: Left Arm, Cuff Size: Normal)   Pulse (!) 54   Temp 98.6 F (37 C) (Oral)   Ht 5\' 7"  (1.702 m)   Wt 153 lb (69.4 kg)   SpO2 98%   BMI 23.96 kg/m    Physical Exam:  General- No distress,  A&Ox3, communicating through interpreter ENT: No sinus tenderness, TM clear, pale nasal mucosa, no oral exudate,no post nasal drip, no LAN Cardiac: S1, S2, regular rate and rhythm, no murmur Chest: No wheeze/ rales/ dullness; no accessory muscle use, no nasal flaring, no sternal retractions, very diminished breath sounds  Abd.: Soft Non-tender,  ND, BS +, Body mass index is 23.96 kg/m. Ext: No clubbing cyanosis, edema Neuro:  normal strength, MAE x 4, A&O x 3 Skin: No rashes, warm and dry, no lesions Psych: normal mood and behavior   Assessment/Plan  COPD on PFT's Moderate with mild reduction in DLCO Plan We will prescribe an inhaler for you to use. Use Anoro Inhaler  once daily Rinse mouth after use  Take this every day without fail. Follow up in 1 month with Dr. Isaiah Serge or Maralyn Sago NP  to evaluate how this medication is working for you. If it is not we can change to include a dual therapy inhaler. Please contact office for sooner follow up if symptoms do not improve or worsen or seek emergency care   ?? ???????? ???? ?? ?????? ????? ?  ??????? PFT ?? COPD ???????? ? ??????? ???? ???? ???? ??????? ?????? ????????? ????? ?? ??? ????? ??????? ?????? ????????? ?????? ??? ??? ?? ?????????  ?? ???? ??? ???? ????????? ?? ?????? ??????? ???? ???? ??? ???????? ? ??? ????????? ???? ??. ????? ?? ???? ??????? ? ??????? ?????? ?????????? ???????? ???????? ?? ???????? ?? ??????? ????? ????? ??? ????? ????? ???????? ???? ?????????? ??????? ?????????    I spent 45 minutes dedicated to the care of this patient on the date of this encounter to include pre-visit review of records, face-to-face time with the patient discussing conditions above, post visit ordering of testing, clinical documentation with the electronic health record, making appropriate referrals as documented, and communicating necessary information to the patient's healthcare team.There was additional time spent teaching patient with a language barrier to use an inhaler.  It takes additional time to care for patient's when there is a language barrier and communication is through an interpreter.   Bevelyn Ngo, NP 08/06/2020  3:39 PM

## 2020-08-06 NOTE — Progress Notes (Signed)
Full PFT performed today. °

## 2020-08-06 NOTE — Patient Instructions (Signed)
Full PFT performed today. °

## 2020-08-06 NOTE — Patient Instructions (Addendum)
It is nice to meet you today.  Your PFT's do show COPD. We will prescribe an inhaler for you to use. Use Anoro Inhaler once daily Rinse mouth after use  Take this every day without fail. Follow up in 1 month with Dr. Isaiah Serge or Maralyn Sago NP  to evaluate how this medication is working for you. If it is not we can change to include a dual therapy inhaler. Please contact office for sooner follow up if symptoms do not improve or worsen or seek emergency care   ?? ???????? ???? ?? ?????? ????? ?  ??????? PFT ?? COPD ???????? ? ??????? ???? ???? ???? ??????? ?????? ????????? ????? ?? ??? ????? ??????? ?????? ????????? ?????? ??? ??? ?? ?????????  ?? ???? ??? ???? ????????? ?? ?????? ??????? ???? ???? ??? ???????? ? ??? ????????? ???? ??. ????? ?? ???? ??????? ? ??????? ?????? ?????????? ???????? ???????? ?? ???????? ?? ??????? ????? ????? ??? ????? ????? ???????? ???? ?????????? ??????? ?????????

## 2020-08-07 ENCOUNTER — Encounter: Payer: Self-pay | Admitting: Acute Care

## 2020-08-13 ENCOUNTER — Ambulatory Visit (INDEPENDENT_AMBULATORY_CARE_PROVIDER_SITE_OTHER): Payer: Medicaid Other | Admitting: Neurology

## 2020-08-13 DIAGNOSIS — M5441 Lumbago with sciatica, right side: Secondary | ICD-10-CM

## 2020-08-13 DIAGNOSIS — M25551 Pain in right hip: Secondary | ICD-10-CM

## 2020-08-13 DIAGNOSIS — R269 Unspecified abnormalities of gait and mobility: Secondary | ICD-10-CM

## 2020-08-13 DIAGNOSIS — R32 Unspecified urinary incontinence: Secondary | ICD-10-CM

## 2020-08-13 DIAGNOSIS — M542 Cervicalgia: Secondary | ICD-10-CM

## 2020-08-13 DIAGNOSIS — G8929 Other chronic pain: Secondary | ICD-10-CM

## 2020-08-13 DIAGNOSIS — Z0289 Encounter for other administrative examinations: Secondary | ICD-10-CM

## 2020-08-13 NOTE — Progress Notes (Signed)
EMG report is under procedure tab. 

## 2020-08-13 NOTE — Procedures (Signed)
Full Name: Seth Snyder Gender: Male MRN #: [id number] Date of Birth: 1961-02-26    Visit Date: 08/13/2020 08:09 Age: 60 Years Examining Physician: Levert Feinstein, MD  Referring Physician: Levert Feinstein, MD History: 60 years old male, complains of chronic neck, low back pain, gait abnormality, weakness right more than left  Summary of the test  Nerve conduction study: Bilateral sural, superficial peroneal, right median and ulnar sensory responses were normal. Bilateral tibial, right peroneal, median and ulnar motor responses were normal.  Left peroneal to EDB motor response was absent, on examination, there was underdevelopment of left extensor digitorum brevis muscle.  Electromyography: Selective needle examination of bilateral lower extremity muscles, bilateral lumbosacral paraspinal muscles; right upper extremity muscles and right cervical paraspinal muscles were performed.  There was no significant abnormality noted.  Conclusion: This is a normal study.  There is no electrodiagnostic evidence of bilateral lumbosacral radiculopathy, right cervical radiculopathy, intrinsic muscle disease, or large fiber peripheral neuropathy.    ------------------------------- Levert Feinstein, M.D. PhD  [facility] [address], [address] [place], [place] [address] Tel: [phone] Fax: [phone]  Verbal informed consent was obtained from the patient, patient was informed of potential risk of procedure, including bruising, bleeding, hematoma formation, infection, muscle weakness, muscle pain, numbness, among others.        MNC    Nerve / Sites Muscle Latency Ref. Amplitude Ref. Rel Amp Segments Distance Velocity Ref. Area    ms ms mV mV %  cm m/s m/s mVms  R Median - APB     Wrist APB 3.5 ?4.4 9.3 ?4.0 100 Wrist - APB 7   30.6     Upper arm APB 7.9  8.0  86.2 Upper arm - Wrist 23 53 ?49 26.3  R Ulnar - ADM     Wrist ADM 2.8 ?3.3 16.0 ?6.0 100 Wrist - ADM 7   40.3      B.Elbow ADM 6.7  16.1  101 B.Elbow - Wrist 20 52 ?49 44.5     A.Elbow ADM 8.6  15.5  96.1 A.Elbow - B.Elbow 10 52 ?49 44.7  R Peroneal - EDB     Ankle EDB 6.5 ?6.5 3.9 ?2.0 100 Ankle - EDB 9   10.0     Fib head EDB 13.9  2.8  71.9 Fib head - Ankle 28 38 ?44 8.1     Pop fossa EDB 16.7  2.7  94.1 Pop fossa - Fib head 10 36 ?44 7.3         Pop fossa - Ankle      L Peroneal - EDB     Ankle EDB NR ?6.5 NR ?2.0 NR Ankle - EDB 9   NR     Fib head EDB NR  NR  NR Fib head - Ankle 29 NR ?44 NR     Pop fossa EDB NR  NR  NR Pop fossa - Fib head 10 NR ?44 NR         Pop fossa - Ankle      R Tibial - AH     Ankle AH 5.8 ?5.8 9.4 ?4.0 100 Ankle - AH 9   17.3     Pop fossa AH 15.0  6.1  65.2 Pop fossa - Ankle 39 43 ?41 13.7  L Tibial - AH     Ankle AH 4.8 ?5.8 11.5 ?4.0 100 Ankle - AH 9   27.2     Pop fossa AH  14.0  7.5  65.7 Pop fossa - Ankle 38 41 ?41 30.0                 SNC    Nerve / Sites Rec. Site Peak Lat Ref.  Amp Ref. Segments Distance    ms ms V V  cm  R Sural - Ankle (Calf)     Calf Ankle 3.0 ?4.4 18 ?6 Calf - Ankle 14  L Sural - Ankle (Calf)     Calf Ankle 4.0 ?4.4 14 ?6 Calf - Ankle 14  R Superficial peroneal - Ankle     Lat leg Ankle 3.9 ?4.4 7 ?6 Lat leg - Ankle 14  L Superficial peroneal - Ankle     Lat leg Ankle 3.3 ?4.4 8 ?6 Lat leg - Ankle 14  R Median - Orthodromic (Dig II, Mid palm)     Dig II Wrist 2.9 ?3.4 20 ?10 Dig II - Wrist 13  R Ulnar - Orthodromic, (Dig V, Mid palm)     Dig V Wrist 2.9 ?3.1 7 ?5 Dig V - Wrist 46                 F  Wave    Nerve F Lat Ref.   ms ms  R Tibial - AH 55.2 ?56.0  L Tibial - AH 55.7 ?56.0  R Ulnar - ADM 29.7 ?32.0           EMG Summary Table    Spontaneous MUAP Recruitment  Muscle IA Fib PSW Fasc Other Amp Dur. Poly Pattern  R. Tibialis anterior Normal None None None _______ Normal Normal Normal Normal  R. Tibialis posterior Normal None None None _______ Normal Normal Normal Normal  R. Peroneus longus Normal None None None  _______ Normal Normal Normal Normal  R. Gastrocnemius (Medial head) Normal None None None _______ Normal Normal Normal Normal  R. Vastus lateralis Normal None None None _______ Normal Normal Normal Normal  L. Tibialis anterior Normal None None None _______ Normal Normal Normal Normal  L. Peroneus longus Normal None None None _______ Normal Normal Normal Normal  L. Tibialis posterior Normal None None None _______ Normal Normal Normal Normal  L. Vastus lateralis Normal None None None _______ Normal Normal Normal Normal  R. Lumbar paraspinals (mid) Normal None None None _______ Normal Normal Normal Normal  R. Lumbar paraspinals (up) Normal None None None _______ Normal Normal Normal Normal  L. Lumbar paraspinals (low) Normal None None None _______ Normal Normal Normal Normal  L. Lumbar paraspinals (mid) Normal None None None _______ Normal Normal Normal Normal  R. First dorsal interosseous Normal None None None _______ Normal Normal Normal Normal  R. Pronator teres Normal None None None _______ Normal Normal Normal Normal  R. Extensor digitorum communis Normal None None None _______ Normal Normal Normal Normal  R. Biceps brachii Normal None None None _______ Normal Normal Normal Normal  R. Deltoid Normal None None None _______ Normal Normal Normal Normal  R. Triceps brachii Normal None None None _______ Normal Normal Normal Normal  R. Cervical paraspinals Normal None None None _______ Normal Normal Normal Normal

## 2020-08-19 ENCOUNTER — Other Ambulatory Visit: Payer: Self-pay | Admitting: Cardiovascular Disease

## 2020-08-22 ENCOUNTER — Other Ambulatory Visit: Payer: Self-pay | Admitting: Neurology

## 2020-08-22 ENCOUNTER — Ambulatory Visit (HOSPITAL_COMMUNITY)
Admission: RE | Admit: 2020-08-22 | Discharge: 2020-08-22 | Disposition: A | Discharge status: Home / Self Care | Payer: Medicaid Other | Source: Ambulatory Visit | Attending: Neurology | Admitting: Neurology

## 2020-08-22 ENCOUNTER — Other Ambulatory Visit: Payer: Self-pay

## 2020-08-22 DIAGNOSIS — M25551 Pain in right hip: Secondary | ICD-10-CM | POA: Insufficient documentation

## 2020-08-22 DIAGNOSIS — R32 Unspecified urinary incontinence: Secondary | ICD-10-CM

## 2020-08-22 DIAGNOSIS — M542 Cervicalgia: Secondary | ICD-10-CM

## 2020-08-22 DIAGNOSIS — R269 Unspecified abnormalities of gait and mobility: Secondary | ICD-10-CM

## 2020-08-22 DIAGNOSIS — M5441 Lumbago with sciatica, right side: Secondary | ICD-10-CM | POA: Insufficient documentation

## 2020-08-22 DIAGNOSIS — G8929 Other chronic pain: Secondary | ICD-10-CM

## 2020-08-26 ENCOUNTER — Ambulatory Visit: Payer: Medicaid Other | Admitting: General Practice

## 2020-09-03 ENCOUNTER — Telehealth: Payer: Self-pay | Admitting: Neurology

## 2020-09-03 NOTE — Telephone Encounter (Signed)
    IMPRESSION: 1. No acute abnormality of the lumbar spine. 2. L4-L5 left lateral recess and mild left neural foraminal stenosis secondary to disc bulge and annular fissure. 3. L5-S1 right subarticular disc protrusion and annular fissure minimally narrowing the right lateral recess. Correlate for right S1 radiculopathy.    IMPRESSION: 1. Decreased size of right foraminal disc extrusion at C6-7 with improved moderate right neural foraminal stenosis. 2. Unchanged mild spinal canal stenosis and moderate right neural foraminal stenosis at C5-6. 3. 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   Please call patient, lumbar spine MRI showed mild degenerative changes, no evidence of the spinal cord or nerve root compression  MRI of cervical spine radiology reported to mild improvement, but still evidence of mild canal stenosis at C5-6, moderate right foraminal stenosis at C5-6, severe left C3-4 foraminal stenosis  Please give him a follow-up appointment to review MRI findings.

## 2020-09-04 ENCOUNTER — Encounter: Payer: Self-pay | Admitting: *Deleted

## 2020-09-04 NOTE — Telephone Encounter (Signed)
I used [employer] (language line) to contact the patient. They left a message for him to call us back to discuss his MRI results. He has also been scheduled for a follow up to review the findings (09/18/20 at 1pm - arrive early for check-in). The appt information was left in the same message.  He lives with his daughter and son-in-law who both speak english (on [place]). There was no answer at either number and voicemail was not set up.   Additionally, I sent a mychart message letting him know we are trying to reach him and provided the appt details.

## 2020-09-08 ENCOUNTER — Encounter: Payer: Self-pay | Admitting: Acute Care

## 2020-09-08 ENCOUNTER — Ambulatory Visit (INDEPENDENT_AMBULATORY_CARE_PROVIDER_SITE_OTHER): Payer: Medicaid Other | Admitting: Acute Care

## 2020-09-08 ENCOUNTER — Other Ambulatory Visit: Payer: Self-pay

## 2020-09-08 VITALS — BP 110/70 | HR 60 | Ht 67.0 in | Wt 154.4 lb

## 2020-09-08 DIAGNOSIS — J441 Chronic obstructive pulmonary disease with (acute) exacerbation: Secondary | ICD-10-CM | POA: Diagnosis not present

## 2020-09-08 MED ORDER — ALBUTEROL SULFATE HFA 108 (90 BASE) MCG/ACT IN AERS: 2.0000 | INHALATION_SPRAY | Freq: Four times a day (QID) | RESPIRATORY_TRACT | 6 refills | Status: DC | PRN

## 2020-09-08 MED ORDER — ADVAIR HFA 115-21 MCG/ACT IN AERO: 2.0000 | INHALATION_SPRAY | Freq: Two times a day (BID) | RESPIRATORY_TRACT | 12 refills | Status: DC

## 2020-09-08 NOTE — Patient Instructions (Addendum)
It is good to see you today.  We will prescribe Advair HFA or Air Duo 2 puffs twice daily>> Every day Rinse mouth after use This is your maintenance inhaler. Use every day whether you feel good or bad We will send in a prescription for a rescue inhaler. This is albuterol. Use this only if you have breakthrough shortness of breath.  1-2 puffs up to 2 times daily, but only if you have shortness of breath.  This is not an every day inhaler.  Follow up with Maralyn Sago NP or Dr. Isaiah Serge in 2 months to evaluate if better on new inhaler.  Please contact office for sooner follow up if symptoms do not improve or worsen or seek emergency care

## 2020-09-08 NOTE — Progress Notes (Signed)
History of Present Illness Seth Snyder is a 60 y.o. male former smoker with COPD. He is followed by Dr.Mannam   09/08/2020 Pt.presents for follow up. He is communicating through an interpreter. He states he had some improvement with Anoro inhaler. On a scale of 1-10 he states this is about a 3. He states on a normal day he stretches, but does not partake in any other significant activity. He is walking with a walker . He has remained smoke free.He is interested in stepping up his inhaler to ICS/ LABA. He denies fever, chest pain, orthopnea of hemoptysis.   Test Results:            CBC Latest Ref Rng & Units 06/20/2020 05/21/2020 05/14/2020  WBC 4.0 - 10.5 K/uL 6.1 5.6 7.4  Hemoglobin 13.0 - 17.0 g/dL 11.5(L) 11.2(L) 11.5(L)  Hematocrit 39.0 - 52.0 % 36.4(L) 35.2(L) 36.0(L)  Platelets 150 - 400 K/uL 220 173 205    BMP Latest Ref Rng & Units 06/20/2020 05/21/2020 05/14/2020  Glucose 70 - 99 mg/dL 86 174(Y) 94  BUN 6 - 20 mg/dL 13 12 14   Creatinine 0.61 - 1.24 mg/dL ) 8.14(G) 8.18(H)  BUN/Creat Ratio 9 - 20 - - -  Sodium 135 - 145 mmol/L 136 136 142  Potassium 3.5 - 5.1 mmol/L 4.7 4.2 4.6  Chloride 98 - 111 mmol/L 106 106 110  CO2 22 - 32 mmol/L 22 22 21(L)  Calcium 8.9 - 10.3 mg/dL 9.0 9.0 6.31(S)    BNP    Component Value Date/Time   BNP 42.8 06/20/2020 1816    ProBNP No results found for: PROBNP  PFT    Component Value Date/Time   FEV1PRE 1.86 08/06/2020 1352   FEV1POST 1.86 08/06/2020 1352   FVCPRE 3.06 08/06/2020 1352   FVCPOST 3.07 08/06/2020 1352   TLC 6.03 08/06/2020 1352   DLCOUNC 20.98 08/06/2020 1352   PREFEV1FVCRT 61 08/06/2020 1352   PSTFEV1FVCRT 61 08/06/2020 1352    MR CERVICAL SPINE WO CONTRAST  Result Date: 08/22/2020 CLINICAL DATA:  Ataxia with chronic neck pain EXAM: MRI CERVICAL SPINE WITHOUT CONTRAST TECHNIQUE: Multiplanar, multisequence MR imaging of the cervical spine was performed. No intravenous contrast was administered.  COMPARISON:  07/19/2019 FINDINGS: Alignment: Physiologic. Vertebrae: No fracture, evidence of discitis, or bone lesion. Cord: Normal signal and morphology. Posterior Fossa, vertebral arteries, paraspinal tissues: Negative. Disc levels: C1-2: Unremarkable. C2-3: Normal disc space and facet joints. There is no spinal canal stenosis. No neural foraminal stenosis. C3-4: Unchanged left asymmetric disc bulge and uncovertebral hypertrophy. There is no spinal canal stenosis. Unchanged severe left neural foraminal stenosis. C4-5: Small disc bulge with uncovertebral spurring. There is no spinal canal stenosis. Mild bilateral neural foraminal stenosis. C5-6: Small central disc protrusion with right-greater-than-left uncovertebral hypertrophy. Mild spinal canal stenosis. Moderate right and mild neural foraminal stenosis. C6-7: Small disc bulge with bilateral uncovertebral hypertrophy. Previously visible right foraminal extrusion has decreased in size. Spinal canal stenosis. Improved moderate right neural foraminal stenosis. C7-T1: Normal disc space and facet joints. There is no spinal canal stenosis. No neural foraminal stenosis. IMPRESSION: 1. Decreased size of right foraminal disc extrusion at C6-7 with improved moderate right neural foraminal stenosis. 2. Unchanged mild spinal canal stenosis and moderate right neural foraminal stenosis at C5-6. 3. 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 Electronically Signed   By: 07/21/2019 M.D.   On: 08/22/2020 20:37   MR LUMBAR SPINE WO CONTRAST  Result Date: 08/22/2020 CLINICAL DATA:  Ataxia EXAM:  MRI LUMBAR SPINE WITHOUT CONTRAST TECHNIQUE: Multiplanar, multisequence MR imaging of the lumbar spine was performed. No intravenous contrast was administered. COMPARISON:  None. FINDINGS: Segmentation:  Standard Alignment:  Normal Vertebrae:  Normal Conus medullaris and cauda equina: Conus extends to the L1 level. Conus and cauda equina appear normal. Paraspinal and other  soft tissues: Negative Disc levels: L1-L2: Normal disc space and facet joints. No spinal canal stenosis. No neural foraminal stenosis. L2-L3: Small disc bulge. No spinal canal stenosis. No neural foraminal stenosis. L3-L4: Mild facet hypertrophy. Normal disc. No spinal canal stenosis. No neural foraminal stenosis. L4-L5: Left asymmetric disc bulge with annular fissure. Left lateral recess narrowing without central spinal canal stenosis. Mild left neural foraminal stenosis. L5-S1: Small right subarticular disc protrusion and annular fissure. Right lateral recess narrowing without central spinal canal stenosis. No neural foraminal stenosis. Visualized sacrum: Normal. IMPRESSION: 1. No acute abnormality of the lumbar spine. 2. L4-L5 left lateral recess and mild left neural foraminal stenosis secondary to disc bulge and annular fissure. 3. L5-S1 right subarticular disc protrusion and annular fissure minimally narrowing the right lateral recess. Correlate for right S1 radiculopathy. Electronically Signed   By: Deatra Robinson M.D.   On: 08/22/2020 20:46   NCV with EMG(electromyography)  Result Date: 08/13/2020 Levert Feinstein, MD     08/13/2020 10:53 AM     Full Name: Oree Suazo Gender: Male MRN #: [id number] Date of Birth: Jul 28, 1960   Visit Date: 08/13/2020 08:09 Age: 64 Years Examining Physician: Levert Feinstein, MD Referring Physician: Levert Feinstein, MD History: 60 years old male, complains of chronic neck, low back pain, gait abnormality, weakness right more than left Summary of the test Nerve conduction study: Bilateral sural, superficial peroneal, right median and ulnar sensory responses were normal. Bilateral tibial, right peroneal, median and ulnar motor responses were normal. Left peroneal to EDB motor response was absent, on examination, there was underdevelopment of left extensor digitorum brevis muscle. Electromyography: Selective needle examination of bilateral lower extremity muscles, bilateral lumbosacral paraspinal  muscles; right upper extremity muscles and right cervical paraspinal muscles were performed.  There was no significant abnormality noted. Conclusion: This is a normal study.  There is no electrodiagnostic evidence of bilateral lumbosacral radiculopathy, right cervical radiculopathy, intrinsic muscle disease, or large fiber peripheral neuropathy. ------------------------------- Levert Feinstein, M.D. PhD [facility] [address], [address] [place], [place] [address] Tel: [phone] Fax: [phone] Verbal informed consent was obtained from the patient, patient was informed of potential risk of procedure, including bruising, bleeding, hematoma formation, infection, muscle weakness, muscle pain, numbness, among others.     MNC   Nerve / Sites Muscle Latency Ref. Amplitude Ref. Rel Amp Segments Distance Velocity Ref. Area   ms ms mV mV %  cm m/s m/s mVms R Median - APB    Wrist APB 3.5 ?4.4 9.3 ?4.0 100 Wrist - APB 7   30.6    Upper arm APB 7.9  8.0  86.2 Upper arm - Wrist 23 53 ?49 26.3 R Ulnar - ADM    Wrist ADM 2.8 ?3.3 16.0 ?6.0 100 Wrist - ADM 7   40.3    B.Elbow ADM 6.7  16.1  101 B.Elbow - Wrist 20 52 ?49 44.5    A.Elbow ADM 8.6  15.5  96.1 A.Elbow - B.Elbow 10 52 ?49 44.7 R Peroneal - EDB    Ankle EDB 6.5 ?6.5 3.9 ?2.0 100 Ankle - EDB 9   10.0    Fib head EDB 13.9  2.8  71.9 Fib head - Ankle 28 38 ?44 8.1    Pop fossa EDB 16.7  2.7  94.1 Pop fossa - Fib head 10 36 ?44 7.3        Pop fossa - Ankle     L Peroneal - EDB    Ankle EDB NR ?6.5 NR ?2.0 NR Ankle - EDB 9   NR    Fib head EDB NR  NR  NR Fib head - Ankle 29 NR ?44 NR    Pop fossa EDB NR  NR  NR Pop fossa - Fib head 10 NR ?44 NR        Pop fossa - Ankle     R Tibial - AH    Ankle AH 5.8 ?5.8 9.4 ?4.0 100 Ankle - AH 9   17.3    Pop fossa AH 15.0  6.1  65.2 Pop fossa - Ankle 39 43 ?41 13.7 L Tibial - AH    Ankle AH 4.8 ?5.8 11.5 ?4.0 100 Ankle - AH 9   27.2    Pop fossa AH 14.0  7.5  65.7 Pop fossa - Ankle 38 41 ?41 30.0               SNC   Nerve /  Sites Rec. Site Peak Lat Ref.  Amp Ref. Segments Distance   ms ms V V  cm R Sural - Ankle (Calf)    Calf Ankle 3.0 ?4.4 18 ?6 Calf - Ankle 14 L Sural - Ankle (Calf)    Calf Ankle 4.0 ?4.4 14 ?6 Calf - Ankle 14 R Superficial peroneal - Ankle    Lat leg Ankle 3.9 ?4.4 7 ?6 Lat leg - Ankle 14 L Superficial peroneal - Ankle    Lat leg Ankle 3.3 ?4.4 8 ?6 Lat leg - Ankle 14 R Median - Orthodromic (Dig II, Mid palm)    Dig II Wrist 2.9 ?3.4 20 ?10 Dig II - Wrist 13 R Ulnar - Orthodromic, (Dig V, Mid palm)    Dig V Wrist 2.9 ?3.1 7 ?5 Dig V - Wrist 62               F  Wave   Nerve F Lat Ref.  ms ms R Tibial - AH 55.2 ?56.0 L Tibial - AH 55.7 ?56.0 R Ulnar - ADM 29.7 ?32.0         EMG Summary Table   Spontaneous MUAP Recruitment Muscle IA Fib PSW Fasc Other Amp Dur. Poly Pattern R. Tibialis anterior Normal None None None _______ Normal Normal Normal Normal R. Tibialis posterior Normal None None None _______ Normal Normal Normal Normal R. Peroneus longus Normal None None None _______ Normal Normal Normal Normal R. Gastrocnemius (Medial head) Normal None None None _______ Normal Normal Normal Normal R. Vastus lateralis Normal None None None _______ Normal Normal Normal Normal L. Tibialis anterior Normal None None None _______ Normal Normal Normal Normal L. Peroneus longus Normal None None None _______ Normal Normal Normal Normal L. Tibialis posterior Normal None None None _______ Normal Normal Normal Normal L. Vastus lateralis Normal None None None _______ Normal Normal Normal Normal R. Lumbar paraspinals (mid) Normal None None None _______ Normal Normal Normal Normal R. Lumbar paraspinals (up) Normal None None None _______ Normal Normal Normal Normal L. Lumbar paraspinals (low) Normal None None None _______ Normal Normal Normal Normal L. Lumbar paraspinals (mid) Normal None None None _______ Normal Normal Normal Normal R. First dorsal interosseous Normal None None None _______  Normal Normal Normal Normal R. Pronator teres  Normal None None None _______ Normal Normal Normal Normal R. Extensor digitorum communis Normal None None None _______ Normal Normal Normal Normal R. Biceps brachii Normal None None None _______ Normal Normal Normal Normal R. Deltoid Normal None None None _______ Normal Normal Normal Normal R. Triceps brachii Normal None None None _______ Normal Normal Normal Normal R. Cervical paraspinals Normal None None None _______ Normal Normal Normal Normal      Past medical hx Past Medical History:  Diagnosis Date   Anemia    Atrial fibrillation (HCC)    Dizziness    Hypertension    Low back pain      Social History   Tobacco Use   Smoking status: Former    Pack years: 0.00    Types: Cigarettes    Quit date: 12/21/2018    Years since quitting: 1.7   Smokeless tobacco: Never  Vaping Use   Vaping Use: Never used  Substance Use Topics   Alcohol use: Not Currently   Drug use: Never    Mr.Laine reports that he quit smoking about 20 months ago. His smoking use included cigarettes. He has never used smokeless tobacco. He reports previous alcohol use. He reports that he does not use drugs.  Tobacco Cessation: Former smoker quit 03/2018 . NO pack year smoking history on file.   Past surgical hx, Family hx, Social hx all reviewed.  Current Outpatient Medications on File Prior to Visit  Medication Sig   acetaminophen (TYLENOL) 500 MG tablet Take 1,000 mg by mouth daily as needed for mild pain.   amiodarone (PACERONE) 200 MG tablet TAKE 1 TABLET BY MOUTH EVERY DAY   amLODipine (NORVASC) 2.5 MG tablet Take 1 tablet (2.5 mg total) by mouth daily.   atorvastatin (LIPITOR) 40 MG tablet TAKE 1 TABLET (40 MG TOTAL) BY MOUTH DAILY AT 6 PM.   citalopram (CELEXA) 40 MG tablet Take 1 tablet (40 mg total) by mouth daily.   DULoxetine (CYMBALTA) 60 MG capsule TAKE 1 CAPSULE BY MOUTH EVERY DAY   ELIQUIS 5 MG TABS tablet TAKE 1 TABLET BY MOUTH TWICE A DAY   gabapentin (NEURONTIN) 300 MG capsule Take 3  capsules (900 mg total) by mouth at bedtime. (Patient taking differently: Take 300-600 mg by mouth See admin instructions. Takes 2 capsule in the morning and 1 capsule at night)   hydrALAZINE (APRESOLINE) 50 MG tablet Take 50 mg by mouth as directed. TAKE 1/2 TABLET AS NEEDED FOR BLOOD PRESSURE ABOVE 150   traZODone (DESYREL) 50 MG tablet Take half to whole tablet at bedtime for sleep.   umeclidinium-vilanterol (ANORO ELLIPTA) 62.5-25 MCG/INH AEPB Inhale 1 puff into the lungs daily.   umeclidinium-vilanterol (ANORO ELLIPTA) 62.5-25 MCG/INH AEPB Inhale 1 puff into the lungs daily.   No current facility-administered medications on file prior to visit.     Allergies  Allergen Reactions   Entresto [Sacubitril-Valsartan]     Abnormal kidney functions     Review Of Systems:  Constitutional:   No  weight loss, night sweats,  Fevers, chills, fatigue, or  lassitude.  HEENT:   No headaches,  Difficulty swallowing,  Tooth/dental problems, or  Sore throat,                No sneezing, itching, ear ache, nasal congestion, post nasal drip,   CV:  No chest pain,  Orthopnea, PND, swelling in lower extremities, anasarca, dizziness, palpitations, syncope.   GI  No heartburn, indigestion,  abdominal pain, nausea, vomiting, diarrhea, change in bowel habits, loss of appetite, bloody stools.   Resp: + shortness of breath with exertion or at rest.  + excess mucus, + productive cough,  No non-productive cough,  No coughing up of blood.  No change in color of mucus.  + wheezing.  No chest wall deformity  Skin: no rash or lesions.  GU: no dysuria, change in color of urine, no urgency or frequency.  No flank pain, no hematuria   MS:  + joint pain or swelling.  + decreased range of motion.  No back pain.  Psych:  No change in mood or affect. No depression or anxiety.  No memory loss.   Vital Signs BP 110/70 (BP Location: Right Arm, Patient Position: Sitting, Cuff Size: Normal)   Pulse 60   Ht 5\' 7"  (1.702  m)   Wt 154 lb 6.4 oz (70 kg)   SpO2 98%   BMI 24.18 kg/m    Physical Exam:  General- No distress,  A&Ox3, pleasant ENT: No sinus tenderness, TM clear, pale nasal mucosa, no oral exudate,no post nasal drip, no LAN Cardiac: S1, S2, regular rate and rhythm, no murmur Chest: No wheeze/ rales/ dullness; no accessory muscle use, no nasal flaring, no sternal retractions, diminished per bases Abd.: Soft Non-tender, ND, BS +, Body mass index is 24.18 kg/m. Ext: No clubbing cyanosis, edema Neuro:  deconditioned at baseline, MAE x 4, A&O x 2-3 Skin: No rashes, warm and dry, intact Psych:Flat affect   Assessment/Plan COPD Moderate obstruction per PFT's Plan We will prescribe Advair HFA or Air Duo 2 puffs twice daily>> Every day Rinse mouth after use This is your maintenance inhaler. Use every day whether you feel good or bad We will send in a prescription for a rescue inhaler. This is albuterol. Use this only if you have breakthrough shortness of breath.  1-2 puffs up to 2 times daily, but only if you have shortness of breath.  This is not an every day inhaler.  Follow up with Maralyn SagoSarah NP or Dr. Isaiah SergeMannam in 2 months to evaluate if better on new inhaler.  Please contact office for sooner follow up if symptoms do not improve or worsen or seek emergency care    I spent 35 minutes dedicated to the care of this patient on the date of this encounter to include pre-visit review of records, face-to-face time with the patient discussing conditions above, post visit ordering of testing, clinical documentation with the electronic health record, making appropriate referrals as documented, and communicating necessary information to the patient's healthcare team.    Bevelyn NgoSarah F Hervey Wedig, NP 09/08/2020  11:45 AM

## 2020-09-11 ENCOUNTER — Other Ambulatory Visit: Payer: Self-pay

## 2020-09-11 ENCOUNTER — Ambulatory Visit (INDEPENDENT_AMBULATORY_CARE_PROVIDER_SITE_OTHER): Payer: Medicaid Other | Admitting: Psychiatry

## 2020-09-11 ENCOUNTER — Encounter (HOSPITAL_COMMUNITY): Payer: Self-pay | Admitting: Psychiatry

## 2020-09-11 VITALS — BP 112/76 | HR 62 | Ht 67.0 in | Wt 152.0 lb

## 2020-09-11 DIAGNOSIS — F3341 Major depressive disorder, recurrent, in partial remission: Secondary | ICD-10-CM | POA: Insufficient documentation

## 2020-09-11 MED ORDER — TRAZODONE HCL 50 MG PO TABS: ORAL_TABLET | ORAL | 1 refills | Status: AC

## 2020-09-11 MED ORDER — CITALOPRAM HYDROBROMIDE 40 MG PO TABS: 40.0000 mg | ORAL_TABLET | Freq: Every day | ORAL | 1 refills | Status: AC

## 2020-09-11 NOTE — Progress Notes (Signed)
BH OP Progress Note   Patient Identification: Seth Snyder MRN:  [id number] Date of Evaluation:  09/11/2020   The session was conducted in [place] with help of Nepali speaking interpreter From [facility]..  Chief Complaint:   " I am okay, the same."  Visit Diagnosis:    ICD-10-CM   1. MDD (major depressive disorder), recurrent, in partial remission (HCC)  F33.41 citalopram (CELEXA) 40 MG tablet    traZODone (DESYREL) 50 MG tablet      History of Present Illness: This is a 60 year old male with history of hypertension, atrial fibrillation, anemia, chronic back pain secondary to spinal stenosis with compression of right side with C7 radiculopathy.    Patient reported that he feels pretty much the same.  He stated that he still feels depressed but it is all in the context of him having severe back pain and that impacting his quality of life.  He denied having any suicidal ideations. His sleep is improved after he started taking trazodone.  He stated that he started off with half a tablet and that was too strong for him as it made him feel groggy the next day.  He started taking one fourth of a tablet and that has helped him immensely with his sleep. He stated that he is receiving pain medications which help to some extent. He stated that in the past he was supposed to undergo surgical procedure for his back however due to uncontrolled hypertension the procedure was canceled.  He stated that he is hoping that now his blood pressure is under control so he will become a surgical candidate and he hopes to get the procedure done by end of this year. He informed that everyone in his family is doing well.  He gets to spend time with his grandson. He denied any other concerns or issues today.   Past Psychiatric History: Depression, anxiety  Previous Psychotropic Medications: Yes  - celexa  Substance Abuse History in the last 12 months:  No.  Consequences of Substance Abuse: NA  Past  Medical History:  Past Medical History:  Diagnosis Date   Anemia    Atrial fibrillation (HCC)    Dizziness    Hypertension    Low back pain     Past Surgical History:  Procedure Laterality Date   CARDIOVERSION N/A 05/10/2019   Procedure: CARDIOVERSION;  Surgeon: Lewayne Bunting, MD;  Location: [facility] ENDOSCOPY;  Service: Cardiovascular;  Laterality: N/A;   COLONOSCOPY  07/12/2013   LEFT HEART CATH AND CORONARY ANGIOGRAPHY N/A 08/30/2019   Procedure: LEFT HEART CATH AND CORONARY ANGIOGRAPHY;  Surgeon: Corky Crafts, MD;  Location: [facility] INVASIVE CV LAB;  Service: Cardiovascular;  Laterality: N/A;   TEE WITHOUT CARDIOVERSION N/A 05/10/2019   Procedure: TRANSESOPHAGEAL ECHOCARDIOGRAM (TEE);  Surgeon: Lewayne Bunting, MD;  Location: [facility] ENDOSCOPY;  Service: Cardiovascular;  Laterality: N/A;    Family Psychiatric History: Siblings have some mental health condition, pt now aware of the details.  Family History:  Family History  Problem Relation Age of Onset   Diabetes Mother    Other Father        unsure of medical history   Diabetes Sister    Mental illness Sister    Mental illness Brother     Social History:   Social History   Socioeconomic History   Marital status: Single    Spouse name: Not on file   Number of children: 1   Years of education: 8th grade  Highest education level: Not on file  Occupational History   Occupation: Disabled  Tobacco Use   Smoking status: Former    Pack years: 0.00    Types: Cigarettes    Quit date: 12/21/2018    Years since quitting: 1.7   Smokeless tobacco: Never  Vaping Use   Vaping Use: Never used  Substance and Sexual Activity   Alcohol use: Not Currently   Drug use: Never   Sexual activity: Not on file  Other Topics Concern   Not on file  Social History Narrative   Right-handed.   Lives at home with his daughter and son-in-law.   No daily use of caffeine.      Social Determinants of Health   Financial Resource Strain:  Medium Risk   Difficulty of Paying Living Expenses: Somewhat hard  Food Insecurity: No Food Insecurity   Worried About Programme researcher, broadcasting/film/video in the Last Year: Never true   Ran Out of Food in the Last Year: Never true  Transportation Needs: No Transportation Needs   Lack of Transportation (Medical): No   Lack of Transportation (Non-Medical): No  Physical Activity: Not on file  Stress: Not on file  Social Connections: Not on file    Additional Social History: Lives with his girlfriend, daughter, son-in-law and 3-year-old grandson.  Currently unemployed and is dealing with financial constraints.  Allergies:   Allergies  Allergen Reactions   Entresto [Sacubitril-Valsartan]     Abnormal kidney functions     Metabolic Disorder Labs: No results found for: HGBA1C, MPG No results found for: PROLACTIN Lab Results  Component Value Date   CHOL 94 (L) 08/15/2019   TRIG 93 08/15/2019   HDL 41 08/15/2019   CHOLHDL 2.3 08/15/2019   VLDL 17 05/07/2019   LDLCALC 35 08/15/2019   LDLCALC 88 05/07/2019   Lab Results  Component Value Date   TSH 11.800 (H) 06/24/2020    Therapeutic Level Labs: No results found for: LITHIUM No results found for: CBMZ No results found for: VALPROATE  Current Medications: Current Outpatient Medications  Medication Sig Dispense Refill   acetaminophen (TYLENOL) 500 MG tablet Take 1,000 mg by mouth daily as needed for mild pain.     albuterol (VENTOLIN HFA) 108 (90 Base) MCG/ACT inhaler Inhale 2 puffs into the lungs every 6 (six) hours as needed for wheezing or shortness of breath. 8 g 6   amiodarone (PACERONE) 200 MG tablet TAKE 1 TABLET BY MOUTH EVERY DAY 90 tablet 3   amLODipine (NORVASC) 2.5 MG tablet Take 1 tablet (2.5 mg total) by mouth daily. 90 tablet 3   atorvastatin (LIPITOR) 40 MG tablet TAKE 1 TABLET (40 MG TOTAL) BY MOUTH DAILY AT 6 PM. 90 tablet 3   DULoxetine (CYMBALTA) 60 MG capsule TAKE 1 CAPSULE BY MOUTH EVERY DAY 90 capsule 3   ELIQUIS 5 MG  TABS tablet TAKE 1 TABLET BY MOUTH TWICE A DAY 180 tablet 1   fluticasone-salmeterol (ADVAIR HFA) 115-21 MCG/ACT inhaler Inhale 2 puffs into the lungs 2 (two) times daily. 1 each 12   gabapentin (NEURONTIN) 300 MG capsule Take 3 capsules (900 mg total) by mouth at bedtime. (Patient taking differently: Take 300-600 mg by mouth See admin instructions. Takes 2 capsule in the morning and 1 capsule at night) 90 capsule 11   hydrALAZINE (APRESOLINE) 50 MG tablet Take 50 mg by mouth as directed. TAKE 1/2 TABLET AS NEEDED FOR BLOOD PRESSURE ABOVE 150     citalopram (CELEXA) 40 MG tablet  Take 1 tablet (40 mg total) by mouth daily. 90 tablet 1   traZODone (DESYREL) 50 MG tablet Take half tablet at bedtime as needed for sleep. 90 tablet 1   No current facility-administered medications for this visit.    Musculoskeletal: Strength & Muscle Tone: within normal limits Gait & Station: unsteady, uses a walker for ambulation Patient leans: Front  Psychiatric Specialty Exam: Review of Systems  Blood pressure 112/76, pulse 62, height 5\' 7"  (1.702 m), weight 152 lb (68.9 kg).Body mass index is 23.81 kg/m.  General Appearance: Fairly Groomed  Eye Contact:  Fair  Speech:  Clear and Coherent and Slow  Volume:  Decreased  Mood: Less Depressed compared to last visit  Affect:  Congruent  Thought Process:  Goal Directed and Descriptions of Associations: Intact  Orientation:  Full (Time, Place, and Person)  Thought Content:  Logical  Suicidal Thoughts:  No  Homicidal Thoughts:  No  Memory:  Immediate;   Good Recent;   Good Remote;   Good  Judgement:  Fair  Insight:  Fair  Psychomotor Activity:  Decreased  Concentration:  Concentration: Good and Attention Span: Good  Recall:  Good  Fund of Knowledge:Good  Language: Good  Akathisia:  Negative  Handed:  Left  AIMS (if indicated):  0  Assets:  Communication Skills Desire for Improvement Financial Resources/Insurance Housing  ADL's:  Intact  Cognition:  WNL  Sleep:  Good   Screenings: PHQ2-9    Flowsheet Row Office Visit from 10/08/2019 in [facility] Office Visit from 09/10/2019 in [facility] Office Visit from 06/13/2019 in [facility] Office Visit from 05/16/2019 in [facility]  PHQ-2 Total Score 1 0 0 1      Flowsheet Row ED from 05/21/2020 in [facility] EMERGENCY DEPARTMENT ED from 05/14/2020 in [facility]  C-SSRS RISK CATEGORY Error: Question 6 not populated No Risk       Assessment and Plan: Patient is still dealing with depressed mood in conjunction with his poor quality of life due to the chronic neurological pain.  He is hoping that he will be a good surgical candidate later this year and once he undergoes a procedure his pain will improve. Since patient is relatively stable and writer does not think any other interventions are necessary from psychiatry 05/16/2020 recommended that he continues to see his PCP for his continuation of medications.  He verbalized his understanding.  1. MDD (major depressive disorder), recurrent, in partial remission (HCC)  - citalopram (CELEXA) 40 MG tablet; Take 1 tablet (40 mg total) by mouth daily.  Dispense: 90 tablet; Refill: 1 - traZODone (DESYREL) 50 MG tablet; Take half tablet at bedtime as needed for sleep.  Dispense: 90 tablet; Refill: 1  Continue same medication regimen. Follow up with PCP. May return back to see psychiatry if needed.   Financial controller, MD 6/23/20222:24 PM

## 2020-09-18 ENCOUNTER — Encounter: Payer: Self-pay | Admitting: Neurology

## 2020-09-18 ENCOUNTER — Ambulatory Visit: Payer: Medicaid Other | Admitting: Neurology

## 2020-09-18 VITALS — BP 128/77 | HR 68 | Ht 67.0 in | Wt 155.0 lb

## 2020-09-18 DIAGNOSIS — M25551 Pain in right hip: Secondary | ICD-10-CM

## 2020-09-18 DIAGNOSIS — G8929 Other chronic pain: Secondary | ICD-10-CM

## 2020-09-18 DIAGNOSIS — M542 Cervicalgia: Secondary | ICD-10-CM | POA: Diagnosis not present

## 2020-09-18 DIAGNOSIS — M5441 Lumbago with sciatica, right side: Secondary | ICD-10-CM | POA: Diagnosis not present

## 2020-09-18 DIAGNOSIS — N189 Chronic kidney disease, unspecified: Secondary | ICD-10-CM

## 2020-09-18 NOTE — Progress Notes (Signed)
Chief Complaint  Patient presents with   Follow-up    New room w/ interpreter from [facility]. Follow up for gait abnormality and neck pain. Here to review cervical and lumbar MRI findings.       ASSESSMENT AND PLAN  Seth Snyder is a 60 y.o. male   Gait abnormality, worsening functional status, urinary frequency, urgency Worsening low back pain, radiating pain to right lower extremity, right groin pain,  EMG nerve conduction study showed no significant abnormality, in specific, there is no evidence of bilateral lumbosacral radiculopathy, or right cervical radiculopathy  MRI of cervical spine showed multilevel degenerative changes, most prominent lower cervical region, C5-6, C6-T1, with mild canal stenosis, no cord signal abnormality, variable  degree of foraminal narrowing  MRI lumbar spine showed multilevel degenerative changes, no evidence of spinal canal or nerve root compression  Is now taking Cymbalta 60 mg daily, plus gabapentin 300 mg 3 tablets at nighttime, which helped his symptoms some, he also take Tylenol as needed, not a good candidate for NSAIDs, with mild abnormal kidney function, Right hip/groin area pain  X-ray of right hip  Refer him to pain management  Physical therapy  DIAGNOSTIC DATA (LABS, IMAGING, TESTING) - I reviewed patient records, labs, notes, testing and imaging myself where available.  MRI of brain on June 21 2020.  1. No acute intracranial abnormality. 2. Multifocal hyperintense T2-weighted signal within the white matter, most commonly seen in the setting of chronic microvascular Ischemia.  ECHO on Jan 5th 2022.  Left ventricular ejection fraction, by estimation, is 60 to 65%. Left ventricular ejection fraction by 3D volume is 61 %. The left ventricle has normal function. The left ventricle has no regional wall motion abnormalities. Left ventricular diastolic parameters were normal. 2. Right ventricular systolic function is normal. The right  ventricular size is normal. There is normal pulmonary artery systolic pressure. 3. The mitral valve is normal in structure. No evidence of mitral valve regurgitation. No evidence of mitral stenosis. 4. The aortic valve is normal in structure. Aortic valve regurgitation is mild. No aortic stenosis is present. Aortic regurgitation PHT measures 458 msec. 5. Aortic dilatation noted. There is mild dilatation of the ascending aorta, measuring 40 mm. 6. The inferior vena cava is normal in size with greater than 50% respiratory variability, suggesting right atrial pressure of 3 mmHg.  HISTORICAL  Seth Snyder is a 60 year old male, seen in request by his primary care nurse practitioner Barbette Merino, for evaluation of constellation of complaints, emphasized on right low back pain, radiating pain to right lower extremity, declining functional status, gait abnormality, initial evaluation was on Jul 29, 2020, he speaks limited [place], native of [place].  I reviewed and summarized the referring note. PMHx HTN HLD. Atrial fibrillation, on eliquis 5mg  bid.  [place] than [place], used to work at [place] job, last job was in December 2020, he complains worsening depression, pain, has to went on disability.  He took his documented daily blood pressure measurement at today's visit, it was in the range of 70/50, up to 150/100  He reported poor functional status at home, came in with a walker, complains of excruciating pain from right gluteus region radiating to right lower extremity, and right groin area, worsening with weightbearing, frequent lightheadedness, dizziness, lower extremity give out underneath him, also complains of a year history of worsening urinary incontinence, urgency,  He reported history of chronic neck pain, now complaints of increased  gait abnormality, incontinence, radiating pain to bilateral shoulder  Personally reviewed MRI of the brain  in April 2022 showed multiple supratentorium small vessel disease no acute abnormality  Echocardiogram in January 2022 showed normal ejection fraction 60 to 65%, no other significant abnormality  He also complains of worsening depression because diffuse body moderate to severe pain, constant, difficult to function, difficulty sleeping, taking trazodone 50 mg daily,  UPDATE September 18 2020: EMG/NCS in May 2022 This is a normal study.  There is no electrodiagnostic evidence of bilateral lumbosacral radiculopathy, right cervical radiculopathy, intrinsic muscle disease, or large fiber peripheral neuropathy.    IMPRESSION: 1. No acute abnormality of the lumbar spine. 2. L4-L5 left lateral recess and mild left neural foraminal stenosis secondary to disc bulge and annular fissure. 3. L5-S1 right subarticular disc protrusion and annular fissure minimally narrowing the right lateral recess. Correlate for right S1 radiculopathy.   IMPRESSION: 1. Decreased size of right foraminal disc extrusion at C6-7 with improved moderate right neural foraminal stenosis. 2. Unchanged mild spinal canal stenosis and moderate right neural foraminal stenosis at C5-6. 3. 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     REVIEW OF SYSTEMS:  Full 14 system review of systems performed and notable only for as above All other review of systems were negative.  PHYSICAL EXAM:   Vitals:   09/18/20 1251  BP: 128/77  Pulse: 68  Weight: 155 lb (70.3 kg)  Height: 5\' 7"  (1.702 m)   Not recorded     Body mass index is 24.28 kg/m.  PHYSICAL EXAMNIATION: Depressed looking middle-aged male  Gen: NAD, conversant, well nourised, well groomed                       NEUROLOGICAL EXAM:  MENTAL STATUS: Speech/cognition: Awake, alert, oriented to history taking and casual conversation  CRANIAL NERVES: CN II: Visual fields are full to confrontation. Pupils are round equal and briskly reactive to light. CN  III, IV, VI: extraocular movement are normal. No ptosis. CN V: Facial sensation is intact to light touch CN VII: Face is symmetric with normal eye closure  CN VIII: Hearing is normal to causal conversation. CN IX, X: Phonation is normal. CN XI: Head turning and shoulder shrug are intact  MOTOR: Motor examination is limited mainly due to right hip pain, felt there was no significant bilateral upper extremity, and lower extremity proximal and distal weakness,  REFLEXES: Reflexes are 2+ and symmetric at the biceps, triceps, 3/3 knees, and ankles. Plantar responses are extensor bilaterally  SENSORY: Mildly decreased light touch vibratory sensation, pinprick to left ankle level  COORDINATION: There is no trunk or limb dysmetria noted.  GAIT/STANCE: He can get up  from seated position arm crossed, limp, antalgic, cautious, able to stand on tip toe and heel  ALLERGIES: Allergies  Allergen Reactions   Entresto [Sacubitril-Valsartan]     Abnormal kidney functions     HOME MEDICATIONS: Current Outpatient Medications  Medication Sig Dispense Refill   acetaminophen (TYLENOL) 500 MG tablet Take 1,000 mg by mouth daily as needed for mild pain.     albuterol (VENTOLIN HFA) 108 (90 Base) MCG/ACT inhaler Inhale 2 puffs into the lungs every 6 (six) hours as needed for wheezing or shortness of breath. 8 g 6   amiodarone (PACERONE) 200 MG tablet TAKE 1 TABLET BY MOUTH EVERY DAY 90 tablet 3   amLODipine (NORVASC) 2.5 MG tablet Take 1 tablet (2.5 mg total) by mouth daily. 90  tablet 3   atorvastatin (LIPITOR) 40 MG tablet TAKE 1 TABLET (40 MG TOTAL) BY MOUTH DAILY AT 6 PM. 90 tablet 3   citalopram (CELEXA) 40 MG tablet Take 1 tablet (40 mg total) by mouth daily. 90 tablet 1   DULoxetine (CYMBALTA) 60 MG capsule TAKE 1 CAPSULE BY MOUTH EVERY DAY 90 capsule 3   ELIQUIS 5 MG TABS tablet TAKE 1 TABLET BY MOUTH TWICE A DAY 180 tablet 1   fluticasone-salmeterol (ADVAIR HFA) 115-21 MCG/ACT inhaler Inhale  2 puffs into the lungs 2 (two) times daily. 1 each 12   gabapentin (NEURONTIN) 300 MG capsule Take 3 capsules (900 mg total) by mouth at bedtime. (Patient taking differently: Take 300-600 mg by mouth See admin instructions. Takes 2 capsule in the morning and 1 capsule at night) 90 capsule 11   hydrALAZINE (APRESOLINE) 50 MG tablet Take 50 mg by mouth as directed. TAKE 1/2 TABLET AS NEEDED FOR BLOOD PRESSURE ABOVE 150     traZODone (DESYREL) 50 MG tablet Take half tablet at bedtime as needed for sleep. 90 tablet 1   No current facility-administered medications for this visit.    PAST MEDICAL HISTORY: Past Medical History:  Diagnosis Date   Anemia    Atrial fibrillation (HCC)    Dizziness    Hypertension    Low back pain     PAST SURGICAL HISTORY: Past Surgical History:  Procedure Laterality Date   CARDIOVERSION N/A 05/10/2019   Procedure: CARDIOVERSION;  Surgeon: Lewayne Bunting, MD;  Location: [facility] ENDOSCOPY;  Service: Cardiovascular;  Laterality: N/A;   COLONOSCOPY  07/12/2013   LEFT HEART CATH AND CORONARY ANGIOGRAPHY N/A 08/30/2019   Procedure: LEFT HEART CATH AND CORONARY ANGIOGRAPHY;  Surgeon: Corky Crafts, MD;  Location: [facility] INVASIVE CV LAB;  Service: Cardiovascular;  Laterality: N/A;   TEE WITHOUT CARDIOVERSION N/A 05/10/2019   Procedure: TRANSESOPHAGEAL ECHOCARDIOGRAM (TEE);  Surgeon: Lewayne Bunting, MD;  Location: [facility] ENDOSCOPY;  Service: Cardiovascular;  Laterality: N/A;    FAMILY HISTORY: Family History  Problem Relation Age of Onset   Diabetes Mother    Other Father        unsure of medical history   Diabetes Sister    Mental illness Sister    Mental illness Brother     SOCIAL HISTORY: Social History   Socioeconomic History   Marital status: Single    Spouse name: Not on file   Number of children: 1   Years of education: 8th grade   Highest education level: Not on file  Occupational History   Occupation: Disabled  Tobacco Use   Smoking status:  Former    Pack years: 0.00    Types: Cigarettes    Quit date: 12/21/2018    Years since quitting: 1.7   Smokeless tobacco: Never  Vaping Use   Vaping Use: Never used  Substance and Sexual Activity   Alcohol use: Not Currently   Drug use: Never   Sexual activity: Not on file  Other Topics Concern   Not on file  Social History Narrative   Right-handed.   Lives at home with his daughter and son-in-law.   No daily use of caffeine.      Social Determinants of Health   Financial Resource Strain: Medium Risk   Difficulty of Paying Living Expenses: Somewhat hard  Food Insecurity: No Food Insecurity   Worried About Running Out of Food in the Last Year: Never true   Ran Out of Food in the Last  Year: Never true  Transportation Needs: No Transportation Needs   Lack of Transportation (Medical): No   Lack of Transportation (Non-Medical): No  Physical Activity: Not on file  Stress: Not on file  Social Connections: Not on file  Intimate Partner Violence: Not on file      Levert Feinstein, M.D. Ph.D.  [facility] [address] [place], [place] [address] Ph: [phone] Fax: [phone]  CC:  Barbette Merino, NP [address] [place],  [place] [address]  Barbette Merino, NP

## 2020-09-18 NOTE — Patient Instructions (Signed)
[facility]    Address: [address] [place], [place], [place] [address]  Phone: [phone]   X-ray of right hip

## 2020-09-24 ENCOUNTER — Telehealth: Payer: Self-pay | Admitting: *Deleted

## 2020-09-24 ENCOUNTER — Ambulatory Visit: Payer: Medicaid Other | Attending: Neurology

## 2020-09-24 ENCOUNTER — Ambulatory Visit
Admission: RE | Admit: 2020-09-24 | Discharge: 2020-09-24 | Disposition: A | Discharge status: Home / Self Care | Payer: Medicaid Other | Source: Ambulatory Visit | Attending: Neurology | Admitting: Neurology

## 2020-09-24 ENCOUNTER — Other Ambulatory Visit: Payer: Self-pay

## 2020-09-24 DIAGNOSIS — G8929 Other chronic pain: Secondary | ICD-10-CM | POA: Diagnosis present

## 2020-09-24 DIAGNOSIS — M6281 Muscle weakness (generalized): Secondary | ICD-10-CM

## 2020-09-24 DIAGNOSIS — M25551 Pain in right hip: Secondary | ICD-10-CM

## 2020-09-24 DIAGNOSIS — M542 Cervicalgia: Secondary | ICD-10-CM | POA: Diagnosis present

## 2020-09-24 DIAGNOSIS — R293 Abnormal posture: Secondary | ICD-10-CM

## 2020-09-24 DIAGNOSIS — M545 Low back pain, unspecified: Secondary | ICD-10-CM | POA: Diagnosis present

## 2020-09-24 DIAGNOSIS — R2689 Other abnormalities of gait and mobility: Secondary | ICD-10-CM | POA: Diagnosis present

## 2020-09-24 DIAGNOSIS — M5441 Lumbago with sciatica, right side: Secondary | ICD-10-CM

## 2020-09-24 NOTE — Therapy (Addendum)
[facility] [address] [place], [place], [address] Phone: [phone]   Fax:  [phone]  Physical Therapy Evaluation  Patient Details  Name: Seth Snyder MRN: [id number] Date of Birth: 1961-02-26 Referring Provider (PT): Levert Feinstein, MD   Encounter Date: 09/24/2020   PT End of Session - 09/24/20 1319     Visit Number 1    Number of Visits 9    Date for PT Re-Evaluation --   re-eval at 9th visit   Authorization Type [employer] (VL: 27)    Authorization Time Period no authorization required    PT Start Time 1315    PT Stop Time 1400    PT Time Calculation (min) 45 min    Activity Tolerance Patient tolerated treatment well    Behavior During Therapy WFL for tasks assessed/performed             Past Medical History:  Diagnosis Date   Anemia    Atrial fibrillation (HCC)    Dizziness    Hypertension    Low back pain     Past Surgical History:  Procedure Laterality Date   CARDIOVERSION N/A 05/10/2019   Procedure: CARDIOVERSION;  Surgeon: Lewayne Bunting, MD;  Location: [facility] ENDOSCOPY;  Service: Cardiovascular;  Laterality: N/A;   COLONOSCOPY  07/12/2013   LEFT HEART CATH AND CORONARY ANGIOGRAPHY N/A 08/30/2019   Procedure: LEFT HEART CATH AND CORONARY ANGIOGRAPHY;  Surgeon: Corky Crafts, MD;  Location: [facility] INVASIVE CV LAB;  Service: Cardiovascular;  Laterality: N/A;   TEE WITHOUT CARDIOVERSION N/A 05/10/2019   Procedure: TRANSESOPHAGEAL ECHOCARDIOGRAM (TEE);  Surgeon: Lewayne Bunting, MD;  Location: [facility] ENDOSCOPY;  Service: Cardiovascular;  Laterality: N/A;    There were no vitals filed for this visit.    Subjective Assessment - 09/24/20 1321     Subjective Patient reports that he has neck pain on the R side that radiates down into the finger (specifically reports the middle finger). Reports neck pain started in 2020, reports that he had an ED visit due to difficulty breathing, since then  he has had the pain in the neck and down into the R arm. Patient also reports pain in the low back along with R hip, reports it is burning sensation. Patient also reports some swelling in the anterior R hip. Patient reports had imaging on hip completed earlier today, no results. Patient reports that when he stands for long periods of time he feels the pain radiate into the lower extremity. Has not worked in two years due to the pain. Patient currently ambulating with [facility] into therapy session. Patient reports he usually walks with a walker.    Patient is accompained by: Interpreter    Pertinent History HTN, A-Fib, Anemia, LBP    Limitations Standing;Walking;House hold activities    How long can you walk comfortably? 5 minutes    Diagnostic tests EMG nerve conduction study showed no significant abnormality, in specific, there is no evidence of bilateral lumbosacral radiculopathy, or right cervical radiculopathy.MRI of cervical spine showed multilevel degenerative changes, most prominent lower cervical region, C5-6, C6-T1, with mild canal stenosis, no cord signal abnormality, variable  degree of foraminal narrowing. MRI lumbar spine showed multilevel degenerative changes, no evidence of spinal canal or nerve root compression    Patient Stated Goals improve pain    Currently in Pain? Yes    Pain Score 9     Pain Location Back    Pain Orientation Right;Posterior  Pain Descriptors / Indicators Burning    Pain Type Chronic pain    Pain Radiating Towards into R Hip/Leg    Pain Onset More than a month ago    Pain Frequency Constant    Aggravating Factors  prolonged position    Pain Relieving Factors Stretching                [facility] PT Assessment - 09/24/20 0001       Assessment   Medical Diagnosis Neck Pain/LBP/R Hip Pain    Referring Provider (PT) Levert Feinstein, MD    Onset Date/Surgical Date 09/18/20    Hand Dominance Right      Precautions   Precautions Fall      Restrictions   Weight  Bearing Restrictions No      Balance Screen   Has the patient fallen in the past 6 months Yes    How many times? 3    Has the patient had a decrease in activity level because of a fear of falling?  Yes    Is the patient reluctant to leave their home because of a fear of falling?  Yes      Home Environment   Living Environment Private residence    Living Arrangements Children    Available Help at Discharge Family    Type of Home House    Home Layout Two level    Alternate Level Stairs-Number of Steps 9-12    Alternate Level Stairs-Rails None    Home Equipment Walker - 2 wheels;Cane - quad      Prior Function   Level of Independence Independent with household mobility with device;Independent with community mobility with device    Vocation On disability      Cognition   Overall Cognitive Status Within Functional Limits for tasks assessed      Observation/Other Assessments   Other Surveys  Oswestry Disability Index    Oswestry Disability Index  provided to patient; will complete with daughter and bring back next session.      Sensation   Light Touch Impaired by gross assessment    Additional Comments decreased light touch on RUE and RLE > compared to LLE/LUE. significant reduction in light touch on RUE (C7 dermatome)      Coordination   Gross Motor Movements are Fluid and Coordinated No    Finger Nose Finger Test slow, reports pain in RUE    Heel Shin Test WNL      Posture/Postural Control   Posture/Postural Control Postural limitations    Postural Limitations Rounded Shoulders;Forward head;Increased thoracic kyphosis    Posture Comments unable to maintain neutral posture with cues, require assistance from PT.      ROM / Strength   AROM / PROM / Strength AROM;Strength      AROM   Overall AROM  Deficits    Overall AROM Comments deficits with pain at end range. Decreased AROM in R Hip due to pain, no formal measurements taken.    AROM Assessment Site Cervical    Cervical  Flexion 30    Cervical Extension 6    Cervical - Right Side Bend 20    Cervical - Left Side Bend 15   pain in R neck   Cervical - Right Rotation 18    Cervical - Left Rotation 14      Strength   Overall Strength Deficits    Strength Assessment Site Hip    Right/Left Hip Right;Left    Right Hip Flexion  3+/5    Right Hip ABduction 3/5    Right Hip ADduction 3+/5    Left Hip Flexion 4/5    Left Hip ABduction 4-/5    Left Hip ADduction 4-/5      Palpation   Palpation comment Tenderness to R Cervical Paraspinals, R Upper Trap, and R Levator Scapule. Significant muscle tension noted along with tenderness to palpation. R lumbar paraspinals tenderness, and increased muscle tension noted.      Special Tests    Special Tests Lumbar;Hip Special Tests    Lumbar Tests Straight Leg Raise    Hip Special Tests  --   attempted FABER, unable to attain position due to pain     Straight Leg Raise   Findings Positive    Side  Right    Comment negative on L side      Bed Mobility   Bed Mobility Rolling Right;Rolling Left;Supine to Sit   independent but increased time required due to pain     Transfers   Transfers Sit to Stand;Stand to Sit    Sit to Stand 5: Supervision    Stand to Sit 5: Supervision    Comments slow guarded movements with sit <> stand due to pain.      Ambulation/Gait   Ambulation/Gait Yes    Ambulation/Gait Assistance 6: Modified independent (Device/Increase time)    Ambulation Distance (Feet) 50 Feet    Assistive device Small based quad cane    Gait Pattern Step-through pattern;Decreased arm swing - right;Decreased step length - left;Decreased stance time - right;Decreased hip/knee flexion - right;Antalgic    Ambulation Surface Level;Indoor    Gait Comments slow guard movements with ambulation, current use of SBQC. PT educating on proper sequencing with use of AD.                        Objective measurements completed on examination: See above findings.                PT Education - 09/24/20 1321     Education Details Educated on [place]; Provided Oswestry to fill out with family and bring back at next session    Person(s) Educated Patient    Methods Explanation    Comprehension Verbalized understanding              PT Short Term Goals - 09/24/20 1507       PT SHORT TERM GOAL #1   Title Patient will be independent with initial HEP for strength/stretching/ROM (ALL STGs: at 4th Visit)    Baseline no HEP established    Time 4    Period Weeks    Status New      PT SHORT TERM GOAL #2   Title to be assessed and LTG to be set as applicable    Baseline TBA    Time 4    Period Weeks    Status New               PT Long Term Goals - 09/24/20 1508       PT LONG TERM GOAL #1   Title Patient will be independent with final HEP for strength/balance/stretching and daily walking program (ALL LTGs Due: at 9th visit)    Baseline no HEP established    Time 8    Period Weeks    Status New    Target Date --   at 9th visit     PT LONG TERM  GOAL #2   Title LTG to be set for    Baseline TBA    Time 8    Period Weeks    Status New      PT LONG TERM GOAL #3   Title LTG to be set for Oswestry    Baseline TBA    Time 8    Period Weeks    Status New      PT LONG TERM GOAL #4   Title Patient will improve cervical ROM by 8 degrees in all direction to demonstrate improved ability to scan environment    Baseline see flowsheet    Time 8    Period Weeks    Status New      PT LONG TERM GOAL #5   Title Patient will be able to ambulate x 100 ft with LRAD vs. No AD and Mod I for improved household mobility    Baseline 50 ft [facility] Supervision    Time 8    Period Weeks    Status New                    Plan - 09/24/20 1319     Clinical Impression Statement Patient is a 60 y.o. male referred to Neuro OPPT services for Neck/Back Pain. Patient's PMH significant for the following: HTN,  A-Fib, Anemia, Chronic LBP. Patient presents with the following impairments: Cervicalgia, LBP and R hip pain, decreased strength, abnormal posture, impaired ROM, impaired sensation, decreased endurance and activity tolerance, abnormal gait, impaired coordination, and decreased balance. Patient demonstrating positive SLR on R Side with radiucular pain reported. Patient will benefit from skilled PT services to address impairments and improve tolerance for functional activity.    Personal Factors and Comorbidities Comorbidity 3+;Time since onset of injury/illness/exacerbation    Comorbidities HTN, A-Fib, Anemia, LBP    Examination-Activity Limitations Bed Mobility;Transfers;Stairs;Stand;Locomotion Level;Squat    Examination-Participation Restrictions Community Activity;Yard Work;Occupation    Stability/Clinical Decision Making Stable/Uncomplicated    Clinical Decision Making Low    Rehab Potential Good    PT Frequency 1x / week    PT Duration 8 weeks    PT Treatment/Interventions ADLs/Self Care Home Management;Aquatic Therapy;Cryotherapy;Electrical Stimulation;Iontophoresis 4mg /ml Dexamethasone;Moist Heat;DME Instruction;Stair training;Functional mobility training;Therapeutic activities;Gait training;Therapeutic exercise;Balance training;Neuromuscular re-education;Patient/family education;Orthotic Fit/Training;Manual techniques;Passive range of motion;Dry needling;Spinal Manipulations;Joint Manipulations    PT Next Visit Plan Assess and update LTG. If patient brought back OSwetry update LTG. further assess low back vs hip involvement. Initiate HEP focused on stretching for cervical/low back.    Consulted and Agree with Plan of Care Patient             Patient will benefit from skilled therapeutic intervention in order to improve the following deficits and impairments:  Abnormal gait, Decreased balance, Difficulty walking, Increased muscle spasms, Impaired sensation, Pain, Postural dysfunction,  Impaired flexibility, Decreased strength, Decreased knowledge of use of DME, Decreased activity tolerance, Decreased coordination, Decreased range of motion, Decreased endurance  Visit Diagnosis: Abnormal posture  Chronic right-sided low back pain, unspecified whether sciatica present  Other abnormalities of gait and mobility  Muscle weakness (generalized)  Cervicalgia  Pain in right hip     Problem List Patient Active Problem List   Diagnosis Date Noted   Chronic kidney disease 09/18/2020   MDD (major depressive disorder), recurrent, in partial remission (HCC) 09/11/2020   Gait abnormality 07/29/2020   Neck pain 07/29/2020   Chronic right-sided low back pain with right-sided sciatica 07/29/2020   Urinary incontinence 07/29/2020  Right hip pain 07/29/2020   MDD (major depressive disorder), recurrent episode, moderate (HCC) 04/15/2020   Persistent atrial fibrillation (HCC) 09/20/2019   Secondary hypercoagulable state (HCC) 09/20/2019   Abnormal nuclear stress test    Spinal stenosis of cervical region 08/13/2019   Protrusion of cervical intervertebral disc 08/13/2019   Shoulder pain    Chronic combined systolic and diastolic heart failure (HCC)    Atrial fibrillation with RVR (HCC) 05/07/2019   Hypertension 05/07/2019   Hypothyroidism 05/07/2019   Hypomagnesemia 05/07/2019   Elevated troponin 05/07/2019   [employer] Authorization   Choose one: Neuro Rehabilitative  Standardized Assessment or Functional Outcome Tool: See Pain Assessment and Oswestry  Score or Percent Disability: TBA (provided to patient, will return to language barrier)  Body Parts Treated (Select each separately):  Cervicothoracic. Overall deficits/functional limitations for body part selected: moderate Lumbopelvic. Overall deficits/functional limitations for body part selected: moderate Other Hip . Overall deficits/functional limitations for body part selected: moderate    Tempie DonningKaitlyn B Robie Mcniel, PT,  DPT 09/24/2020, 3:26 PM  [facility] [address] [place], [place], [address] Phone: [phone]   Fax:  [phone]  Name: Seth Snyder MRN: [id number] Date of Birth: 11-Dec-1960

## 2020-09-24 NOTE — Telephone Encounter (Signed)
I called the patient through the language line [phone]). The interpreter left a message with his hip x-ray results. He also provided our number to call us back with any questions. He lives with his dgt and son-in-law who both english.

## 2020-09-25 NOTE — Telephone Encounter (Signed)
Right hip xray showed degenerative changes, similar to prior study. There was no acute findings.   The is information has been left on his voicemail (through interpreter services). His results were also sent to him through [place].

## 2020-09-25 NOTE — Telephone Encounter (Signed)
Pt returned call to [facility]. Please call.

## 2020-09-29 NOTE — Telephone Encounter (Signed)
Pt called, would like a call from the nurse to discuss results. Pt said, did not get voicemail, do not understand MyChart. Can contact at [phone]

## 2020-09-29 NOTE — Telephone Encounter (Signed)
Pt verified by name and DOB,  results given per provider, pt voiced understanding all question answered. Pt will see pain management

## 2020-10-01 ENCOUNTER — Other Ambulatory Visit: Payer: Self-pay

## 2020-10-01 ENCOUNTER — Ambulatory Visit (INDEPENDENT_AMBULATORY_CARE_PROVIDER_SITE_OTHER): Payer: Medicaid Other | Admitting: Cardiovascular Disease

## 2020-10-01 ENCOUNTER — Encounter (HOSPITAL_BASED_OUTPATIENT_CLINIC_OR_DEPARTMENT_OTHER): Payer: Self-pay | Admitting: Cardiovascular Disease

## 2020-10-01 DIAGNOSIS — I5042 Chronic combined systolic (congestive) and diastolic (congestive) heart failure: Secondary | ICD-10-CM

## 2020-10-01 DIAGNOSIS — R0789 Other chest pain: Secondary | ICD-10-CM

## 2020-10-01 DIAGNOSIS — I4891 Unspecified atrial fibrillation: Secondary | ICD-10-CM | POA: Diagnosis not present

## 2020-10-01 DIAGNOSIS — I10 Essential (primary) hypertension: Secondary | ICD-10-CM

## 2020-10-01 DIAGNOSIS — M25511 Pain in right shoulder: Secondary | ICD-10-CM

## 2020-10-01 HISTORY — DX: Other chest pain: R07.89

## 2020-10-01 MED ORDER — PANTOPRAZOLE SODIUM 40 MG PO TBEC: 40.0000 mg | DELAYED_RELEASE_TABLET | Freq: Every day | ORAL | 3 refills | Status: DC

## 2020-10-01 NOTE — Assessment & Plan Note (Signed)
Seth Snyder has atypical chest pain.  His cath showed nearly normal coronary arteries.  Therefore I do not think this is angina.  We will give him a trial of pantoprazole 40 mg daily to see if that helps.

## 2020-10-01 NOTE — Assessment & Plan Note (Signed)
He is at low risk for shoulder surgery when he is ready.  He plans to do this in the winter.

## 2020-10-01 NOTE — Assessment & Plan Note (Signed)
Systolic function has recovered and normalized.  He is euvolemic on exam and doing well.

## 2020-10-01 NOTE — Progress Notes (Signed)
Cardiology Office Note   Date:  10/05/2020   ID:  Seth Snyder, Waring Jan 29, 1961, MRN [id number]  PCP:  Barbette Merino, NP  Cardiologist:   Chilton Si, MD   Chief Complaint  Patient presents with   Follow-up    3-4 months.   Headache   Shortness of Breath      History of Present Illness: Seth Snyder is a 60 y.o. male with atrial fibrillation, hypertension, and chronic systolic diastolic heart failure who presents for follow-up.  He was initially diagnosed with atrial fibrillation 01/2020 [facility].  He presented with hypertension and palpitations.  He had an echo 05/2019 that revealed LVEF 45 to 50% with global hypokinesis.  Right atrial pressure was 8 mmHg.  He underwent stress test 05/2019 which was suggestive of apical ischemia.  He subsequently had a left heart cath 06/2019 that revealed normal coronaries.  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  He underwent TEE/cardioversion on 04/2019 but went back into atrial fibrillation.  He was started on amiodarone.  He is been seen by Dr. Johney Frame, most recently 10/2019.  They discussed ablation in the future.  However this was deferred as he may need surgical intervention on his cervical radiculopathy.  He was in sinus rhythm at that appointment.  He stopped Entresto and spironolactone due to worsening renal function. At his last appointment, his bp was extremely elevated, metoprolol was switched to carvedilol and hydralazine was added. He was seen in the ED 07/10/19 with dizziness and hypotension. Hydralazine was discontinued.    At his last appointment he complained of labile blood pressures and burning chest pain. Hydralazine was discontinued and he was started on amlodipine. He followed up with Edd Fabian, NP 07/2020 and his blood pressures were better controlled but still labile. He was asked to not take amlodipine whenever his SBP was not elevated. Today, he reports feeling a little better overall. Lately, he  believes his symptoms have improved since the medication changes. Occasionally he still feels some sharp chest pain while sitting, sleeping at night, or while exercising. For exercise, he performs some stretching exercises at home.   He is also having some difficulty sleeping due to back pain and a burning sensation in his feet. He states taking gabapentin is helping his symptoms. Of note, he reports having some facial edema in his left mouth, and he still has some pain in his gums. He denies any shortness of breath, palpitations. No headaches, lightheadedness, or syncope to report. Also has no lower extremity edema, orthopnea or PND.    Past Medical History:  Diagnosis Date   Anemia    Atrial fibrillation (HCC)    Atypical chest pain 10/01/2020   Dizziness    Hypertension    Low back pain     Past Surgical History:  Procedure Laterality Date   CARDIOVERSION N/A 05/10/2019   Procedure: CARDIOVERSION;  Surgeon: Lewayne Bunting, MD;  Location: [facility] ENDOSCOPY;  Service: Cardiovascular;  Laterality: N/A;   COLONOSCOPY  07/12/2013   LEFT HEART CATH AND CORONARY ANGIOGRAPHY N/A 08/30/2019   Procedure: LEFT HEART CATH AND CORONARY ANGIOGRAPHY;  Surgeon: Corky Crafts, MD;  Location: [facility] INVASIVE CV LAB;  Service: Cardiovascular;  Laterality: N/A;   TEE WITHOUT CARDIOVERSION N/A 05/10/2019   Procedure: TRANSESOPHAGEAL ECHOCARDIOGRAM (TEE);  Surgeon: Lewayne Bunting, MD;  Location: [facility] ENDOSCOPY;  Service: Cardiovascular;  Laterality: N/A;     Current Outpatient Medications  Medication Sig Dispense Refill   acetaminophen (  TYLENOL) 500 MG tablet Take 1,000 mg by mouth daily as needed for mild pain.     albuterol (VENTOLIN HFA) 108 (90 Base) MCG/ACT inhaler Inhale 2 puffs into the lungs every 6 (six) hours as needed for wheezing or shortness of breath. 8 g 6   amiodarone (PACERONE) 200 MG tablet TAKE 1 TABLET BY MOUTH EVERY DAY 90 tablet 3   amLODipine (NORVASC) 2.5 MG tablet Take 1 tablet  (2.5 mg total) by mouth daily. 90 tablet 3   atorvastatin (LIPITOR) 40 MG tablet TAKE 1 TABLET (40 MG TOTAL) BY MOUTH DAILY AT 6 PM. 90 tablet 3   citalopram (CELEXA) 40 MG tablet Take 1 tablet (40 mg total) by mouth daily. 90 tablet 1   DULoxetine (CYMBALTA) 60 MG capsule TAKE 1 CAPSULE BY MOUTH EVERY DAY 90 capsule 3   ELIQUIS 5 MG TABS tablet TAKE 1 TABLET BY MOUTH TWICE A DAY 180 tablet 1   fluticasone-salmeterol (ADVAIR HFA) 115-21 MCG/ACT inhaler Inhale 2 puffs into the lungs 2 (two) times daily. 1 each 12   gabapentin (NEURONTIN) 300 MG capsule Take 3 capsules (900 mg total) by mouth at bedtime. (Patient taking differently: Take 300-600 mg by mouth See admin instructions. Takes 2 capsule in the morning and 1 capsule at night) 90 capsule 11   hydrALAZINE (APRESOLINE) 50 MG tablet Take 50 mg by mouth as directed. TAKE 1/2 TABLET AS NEEDED FOR BLOOD PRESSURE ABOVE 150     pantoprazole (PROTONIX) 40 MG tablet Take 1 tablet (40 mg total) by mouth daily. 90 tablet 3   traZODone (DESYREL) 50 MG tablet Take half tablet at bedtime as needed for sleep. 90 tablet 1   carvedilol (COREG) 25 MG tablet Take 25 mg by mouth 2 (two) times daily.     levothyroxine (SYNTHROID) 25 MCG tablet Take 1 tablet (25 mcg total) by mouth daily before breakfast. 90 tablet 0   No current facility-administered medications for this visit.    Allergies:   Entresto [sacubitril-valsartan]    Social History:  The patient  reports that he quit smoking about 21 months ago. His smoking use included cigarettes. He has never used smokeless tobacco. He reports previous alcohol use. He reports that he does not use drugs.   Family History:  The patient's family history includes Diabetes in his mother and sister; Mental illness in his brother and sister; Other in his father.    ROS:   Please see the history of present illness. (+) Sharp chest pain (+) Back pain (+) Burning sensation, bilateral feet All other systems are  reviewed and negative.    PHYSICAL EXAM: VS:  BP 104/70 (BP Location: Left Arm, Patient Position: Sitting, Cuff Size: Normal)   Pulse 63   Ht 5\' 7"  (1.702 m)   Wt 155 lb (70.3 kg)   BMI 24.28 kg/m  , BMI Body mass index is 24.28 kg/m. GENERAL: Well-appearing.  No acute distress HEENT:  Pupils equal round and reactive, fundi not visualized, oral mucosa unremarkable NECK:  No jugular venous distention, waveform within normal limits, carotid upstroke brisk and symmetric, no bruits LUNGS:  Clear to auscultation bilaterally HEART:  RRR.  PMI not displaced or sustained,S1 and S2 within normal limits, no S3, no S4, no clicks, no rubs, no murmurs ABD:  Flat, positive bowel sounds normal in frequency in pitch, no bruits, no rebound, no guarding, no midline pulsatile mass, no hepatomegaly, no splenomegaly EXT:  2 plus pulses throughout, no edema, no cyanosis no clubbing  SKIN:  No rashes no nodules NEURO:  Cranial nerves II through XII grossly intact, motor grossly intact throughout PSYCH:  Cognitively intact, oriented to person place and time  EKG:   10/01/2020: EKG is not ordered today. 06/24/2020: EKG was not ordered.  Echo 03/26/20:  1. Left ventricular ejection fraction, by estimation, is 60 to 65%. Left  ventricular ejection fraction by 3D volume is 61 %. The left ventricle has  normal function. The left ventricle has no regional wall motion  abnormalities. Left ventricular diastolic   parameters were normal.   2. Right ventricular systolic function is normal. The right ventricular  size is normal. There is normal pulmonary artery systolic pressure.   3. The mitral valve is normal in structure. No evidence of mitral valve  regurgitation. No evidence of mitral stenosis.   4. The aortic valve is normal in structure. Aortic valve regurgitation is  mild. No aortic stenosis is present. Aortic regurgitation PHT measures 458  msec.   5. Aortic dilatation noted. There is mild dilatation of the  ascending  aorta, measuring 40 mm.   6. The inferior vena cava is normal in size with greater than 50%  respiratory variability, suggesting right atrial pressure of 3 mmHg.   LHC 08/2019: There is severe left ventricular systolic dysfunction. The left ventricular ejection fraction is less than 25% by visual estimate. LV end diastolic pressure is normal. LVEDP 12 mm Hg. There is no aortic valve stenosis. No significant CAD. Severe vasospasm of right radial artery, even with 4 Fr catheter. Would not use the right radial artery if cath was needed in the future.   Medical therapy for nonischemic cardiomyopathy.   Resume Eliquis tomorrow morning.   Lexiscan Myoview 05/2019: There was no ST segment deviation noted during stress. Study was not gated due to atrial fibrillation Defect 1: There is a small fixed defect of mild severity present in the apex location. Defect 2: There is a small reversible defect of mild severity present in the basal inferior, mid inferior and apical inferior location. Findings consistent with ischemia and prior myocardial infarction. This is a low risk study given small area of ischemia.   Fixed defect at apex, suggests infarct.  Reversible inferior defect, suggests ischemia.  Echo 04/2019:  1. Left ventricular ejection fraction, by estimation, is 45 to 50%. The  left ventricle has mildly decreased function. The left ventricle  demonstrates global hypokinesis. Left ventricular diastolic function could  not be evaluated.   2. Right ventricular systolic function is low normal. The right  ventricular size is normal. There is mildly elevated pulmonary artery  systolic pressure.   3. The mitral valve is grossly normal. Mild mitral valve regurgitation.   4. Tricuspid valve regurgitation is mild to moderate.   5. The aortic valve is tricuspid. Aortic valve regurgitation is mild. No  aortic stenosis is present.   6. There is mild dilatation of the ascending aorta  measuring 39 mm.   7. The inferior vena cava is dilated in size with >50% respiratory  variability, suggesting right atrial pressure of 8 mmHg.   Recent Labs: 06/20/2020: ALT 27; B Natriuretic Peptide 42.8; Hemoglobin 11.5; Platelets 220 10/02/2020: BUN 22; Creatinine, Aloysius 1.44; Potassium 4.3; Sodium 136; TSH 17.100    Lipid Panel    Component Value Date/Time   CHOL 128 10/02/2020 1122   TRIG 131 10/02/2020 1122   HDL 53 10/02/2020 1122   CHOLHDL 2.4 10/02/2020 1122   CHOLHDL 2.8 05/07/2019 2355  VLDL 17 05/07/2019 0823   LDLCALC 52 10/02/2020 1122      Wt Readings from Last 3 Encounters:  10/02/20 157 lb 0.2 oz (71.2 kg)  10/01/20 155 lb (70.3 kg)  09/18/20 155 lb (70.3 kg)      ASSESSMENT AND PLAN: Atrial fibrillation with RVR (HCC) Today he is in sinus rhythm.  Continue amiodarone and Eliquis.  Chronic combined systolic and diastolic heart failure (HCC) Systolic function has recovered and normalized.  He is euvolemic on exam and doing well.  Shoulder pain He is at low risk for shoulder surgery when he is ready.  He plans to do this in the winter.  Hypertension Blood pressure is much more stable.  He has rare readings and is feeling better.  Continue amlodipine.  He has hydralazine to take as needed for SBP greater than 150.  Atypical chest pain Mr. Violett has atypical chest pain.  His cath showed nearly normal coronary arteries.  Therefore I do not think this is angina.  We will give him a trial of pantoprazole 40 mg daily to see if that helps.    Current medicines are reviewed at length with the patient today.  The patient does not have concerns regarding medicines.  The following changes have been made: Stop hydralazine and add amlodipine.  He can still take hydralazine as needed for blood pressures that are greater than 1 daily.  Labs/ tests ordered today include:   No orders of the defined types were placed in this encounter.    Disposition:   FU with  Jasneet Schobert C. Duke Salvia, MD, [facility] in 6 months.  I,Mathew Stumpf,acting as a Neurosurgeon for Chilton Si, MD.,have documented all relevant documentation on the behalf of Chilton Si, MD,as directed by  Chilton Si, MD while in the presence of Chilton Si, MD.  I, Xitlalli Newhard C. Duke Salvia, MD have reviewed all documentation for this visit.  The documentation of the exam, diagnosis, procedures, and orders on 10/05/2020 are all accurate and complete.   Signed, Scout Guyett C. Duke Salvia, MD, [facility]  10/05/2020 1:21 PM    [facility]

## 2020-10-01 NOTE — Assessment & Plan Note (Addendum)
Blood pressure is much more stable.  He has rare readings and is feeling better.  Continue amlodipine.  He has hydralazine to take as needed for SBP greater than 150.

## 2020-10-01 NOTE — Assessment & Plan Note (Signed)
Today he is in sinus rhythm.  Continue amiodarone and Eliquis.

## 2020-10-01 NOTE — Patient Instructions (Addendum)
Medication Instructions:  START PANTOPRAZOLE 40 MG DAILY   *If you need a refill on your cardiac medications before your next appointment, please call your pharmacy*  Lab Work: NONE  Testing/Procedures: NONE   Follow-Up: At [facility], you and your health needs are our priority.  As part of our continuing mission to provide you with exceptional heart care, we have created designated Provider Care Teams.  These Care Teams include your primary Cardiologist (physician) and Advanced Practice Providers (APPs -  Physician Assistants and Nurse Practitioners) who all work together to provide you with the care you need, when you need it.  We recommend signing up for the patient portal called "MyChart".  Sign up information is provided on this After Visit Summary.  MyChart is used to connect with patients for Virtual Visits (Telemedicine).  Patients are able to view lab/test results, encounter notes, upcoming appointments, etc.  Non-urgent messages can be sent to your provider as well.   To learn more about what you can do with MyChart, go to [email or web address].    Your next appointment:   6 month(s)  The format for your next appointment:   In Person  Provider:   Chilton Si, MD or Gillian Shields, NP

## 2020-10-02 ENCOUNTER — Encounter: Payer: Self-pay | Admitting: Nurse Practitioner

## 2020-10-02 ENCOUNTER — Ambulatory Visit (INDEPENDENT_AMBULATORY_CARE_PROVIDER_SITE_OTHER): Payer: Medicaid Other | Admitting: Nurse Practitioner

## 2020-10-02 ENCOUNTER — Ambulatory Visit: Payer: Medicaid Other | Admitting: Physical Therapy

## 2020-10-02 VITALS — BP 105/73 | HR 69 | Temp 97.3°F | Ht 67.0 in | Wt 157.0 lb

## 2020-10-02 DIAGNOSIS — M545 Low back pain, unspecified: Secondary | ICD-10-CM

## 2020-10-02 DIAGNOSIS — M6281 Muscle weakness (generalized): Secondary | ICD-10-CM

## 2020-10-02 DIAGNOSIS — F332 Major depressive disorder, recurrent severe without psychotic features: Secondary | ICD-10-CM | POA: Diagnosis not present

## 2020-10-02 DIAGNOSIS — I4891 Unspecified atrial fibrillation: Secondary | ICD-10-CM

## 2020-10-02 DIAGNOSIS — G8929 Other chronic pain: Secondary | ICD-10-CM

## 2020-10-02 DIAGNOSIS — M25551 Pain in right hip: Secondary | ICD-10-CM

## 2020-10-02 DIAGNOSIS — Z1329 Encounter for screening for other suspected endocrine disorder: Secondary | ICD-10-CM

## 2020-10-02 DIAGNOSIS — M4802 Spinal stenosis, cervical region: Secondary | ICD-10-CM | POA: Diagnosis not present

## 2020-10-02 DIAGNOSIS — M542 Cervicalgia: Secondary | ICD-10-CM

## 2020-10-02 DIAGNOSIS — R293 Abnormal posture: Secondary | ICD-10-CM

## 2020-10-02 DIAGNOSIS — B354 Tinea corporis: Secondary | ICD-10-CM

## 2020-10-02 DIAGNOSIS — I10 Essential (primary) hypertension: Secondary | ICD-10-CM

## 2020-10-02 DIAGNOSIS — R2689 Other abnormalities of gait and mobility: Secondary | ICD-10-CM

## 2020-10-02 DIAGNOSIS — M5441 Lumbago with sciatica, right side: Secondary | ICD-10-CM

## 2020-10-02 DIAGNOSIS — Z13220 Encounter for screening for lipoid disorders: Secondary | ICD-10-CM

## 2020-10-02 NOTE — Therapy (Signed)
[facility] [address] [place], [place], [address] Phone: [phone]   Fax:  [phone]  Physical Therapy Treatment  Patient Details  Name: Seth Snyder MRN: [id number] Date of Birth: 22-Nov-1960 Referring Provider (PT): Levert Feinstein, MD   Encounter Date: 10/02/2020   PT End of Session - 10/02/20 1536     Visit Number 2    Number of Visits 9    Date for PT Re-Evaluation --   re-eval at 9th visit   Authorization Type [employer] (VL: 27)    Authorization Time Period no authorization required    PT Start Time 1450    PT Stop Time 1535    PT Time Calculation (min) 45 min    Activity Tolerance Patient tolerated treatment well    Behavior During Therapy [facility] for tasks assessed/performed             Past Medical History:  Diagnosis Date   Anemia    Atrial fibrillation (HCC)    Atypical chest pain 10/01/2020   Dizziness    Hypertension    Low back pain     Past Surgical History:  Procedure Laterality Date   CARDIOVERSION N/A 05/10/2019   Procedure: CARDIOVERSION;  Surgeon: Lewayne Bunting, MD;  Location: [facility] ENDOSCOPY;  Service: Cardiovascular;  Laterality: N/A;   COLONOSCOPY  07/12/2013   LEFT HEART CATH AND CORONARY ANGIOGRAPHY N/A 08/30/2019   Procedure: LEFT HEART CATH AND CORONARY ANGIOGRAPHY;  Surgeon: Corky Crafts, MD;  Location: [facility] INVASIVE CV LAB;  Service: Cardiovascular;  Laterality: N/A;   TEE WITHOUT CARDIOVERSION N/A 05/10/2019   Procedure: TRANSESOPHAGEAL ECHOCARDIOGRAM (TEE);  Surgeon: Lewayne Bunting, MD;  Location: [facility] ENDOSCOPY;  Service: Cardiovascular;  Laterality: N/A;    There were no vitals filed for this visit.   Subjective Assessment - 10/02/20 1454     Subjective Pt states there was nothing that showed up in regards to his R hip swelling on imaging. Nothing else new or different to report.    Patient is accompained by: Interpreter    Pertinent History HTN,  A-Fib, Anemia, LBP    Limitations Standing;Walking;House hold activities    How long can you walk comfortably? 5 minutes    Diagnostic tests EMG nerve conduction study showed no significant abnormality, in specific, there is no evidence of bilateral lumbosacral radiculopathy, or right cervical radiculopathy.MRI of cervical spine showed multilevel degenerative changes, most prominent lower cervical region, C5-6, C6-T1, with mild canal stenosis, no cord signal abnormality, variable  degree of foraminal narrowing. MRI lumbar spine showed multilevel degenerative changes, no evidence of spinal canal or nerve root compression    Patient Stated Goals improve pain    Currently in Pain? Yes    Pain Score 7     Pain Location Back    Pain Orientation Right;Posterior    Pain Descriptors / Indicators Burning    Pain Type Chronic pain    Pain Onset More than a month ago                [employer] PT Assessment - 10/02/20 0001       Observation/Other Assessments   Oswestry Disability Index  74% or 37/50      6 Minute Walk- Baseline   6 Minute Walk- Baseline yes    BP (mmHg) 146/90    HR (bpm) 61    Modified Borg Scale for Dyspnea 0- Nothing at all      6 minute walk test  results    Aerobic Endurance Distance Walked 395    Endurance additional comments Limited due to R back/hip pain                           [facility] Adult PT Treatment/Exercise - 10/02/20 0001       Exercises   Exercises Lumbar      Lumbar Exercises: Stretches   Double Knee to Chest Stretch 20 seconds;2 reps    Lower Trunk Rotation Other (comment)   x10 reps difficulty tolerating   Piriformis Stretch 30 seconds      Lumbar Exercises: Seated   Other Seated Lumbar Exercises pball press down 10x3"    Other Seated Lumbar Exercises Pelvic tilt x10      Lumbar Exercises: Supine   Pelvic Tilt 5 reps   attempted but unable without too much pain   Large Ball Abdominal Isometric 5 reps   attempted but too much  pain                     PT Short Term Goals - 10/02/20 1717       PT SHORT TERM GOAL #1   Title Patient will be independent with initial HEP for strength/stretching/ROM (ALL STGs: at 4th Visit)    Baseline no HEP established    Time 4    Period Weeks    Status On-going      PT SHORT TERM GOAL #2   Title to be assessed and LTG to be set as applicable    Baseline TBA    Time 4    Period Weeks    Status Achieved               PT Long Term Goals - 10/02/20 1717       PT LONG TERM GOAL #1   Title Patient will be independent with final HEP for strength/balance/stretching and daily walking program (ALL LTGs Due: at 9th visit)    Baseline no HEP established    Time 8    Period Weeks    Status New      PT LONG TERM GOAL #2   Title Pt will be able to tolerate ambulating at least 595' during 6 MWT with rollator to demo MDIC and improved community mobility    Baseline 395' with rollator    Time 8    Period Weeks    Status New    Target Date 11/27/20      PT LONG TERM GOAL #3   Title Pt will have improved Oswestry Low Back Disability score by at least 11 points to demonstrate MDC    Baseline 37/50    Time 8    Period Weeks    Status New    Target Date 11/27/20      PT LONG TERM GOAL #4   Title Patient will improve cervical ROM by 8 degrees in all direction to demonstrate improved ability to scan environment    Baseline see flowsheet    Time 8    Period Weeks    Status New      PT LONG TERM GOAL #5   Title Patient will be able to ambulate x 100 ft with LRAD vs. No AD and Mod I for improved household mobility    Baseline 50 ft [facility] Supervision    Time 8    Period Weeks    Status New  Plan - 10/02/20 1708     Clinical Impression Statement Checked 6 MWT performed this session -- pt very slow at 395'. R hip/back pain flared up by end of 6 minutes. ODI calculated to be 74% (37/50). Attempted to initiate supine  strengthening; however, pt unable to tolerate PPT in supine or contract core in supine position. Able to tolerate core strengthening in sitting. Provided stretches to address low back pain. Pt with hypomobile pelvic/lumbar movement with noted paraspinal spasm upon palpation. Discussed transfer to [facility] as this is closer to his home.    Personal Factors and Comorbidities Comorbidity 3+;Time since onset of injury/illness/exacerbation    Comorbidities HTN, A-Fib, Anemia, LBP    Examination-Activity Limitations Bed Mobility;Transfers;Stairs;Stand;Locomotion Level;Squat    Stability/Clinical Decision Making Stable/Uncomplicated    Rehab Potential Good    PT Frequency 1x / week    PT Duration 8 weeks    PT Treatment/Interventions ADLs/Self Care Home Management;Aquatic Therapy;Cryotherapy;Electrical Stimulation;Iontophoresis 4mg /ml Dexamethasone;Moist Heat;DME Instruction;Stair training;Functional mobility training;Therapeutic activities;Gait training;Therapeutic exercise;Balance training;Neuromuscular re-education;Patient/family education;Orthotic Fit/Training;Manual techniques;Passive range of motion;Dry needling;Spinal Manipulations;Joint Manipulations    PT Next Visit Plan Initiate HEP focused on stretching for cervical/low back. Work on core strengthening as pt is able to tolerate. Work on pelvic/low back AROM.    PT Home Exercise Plan Access Code    Consulted and Agree with Plan of Care Patient             Patient will benefit from skilled therapeutic intervention in order to improve the following deficits and impairments:  Abnormal gait, Decreased balance, Difficulty walking, Increased muscle spasms, Impaired sensation, Pain, Postural dysfunction, Impaired flexibility, Decreased strength, Decreased knowledge of use of DME, Decreased activity tolerance, Decreased coordination, Decreased range of motion, Decreased endurance  Visit Diagnosis: Abnormal posture  Chronic right-sided  low back pain, unspecified whether sciatica present  Other abnormalities of gait and mobility  Muscle weakness (generalized)  Cervicalgia  Pain in right hip     Problem List Patient Active Problem List   Diagnosis Date Noted   Atypical chest pain 10/01/2020   Chronic kidney disease 09/18/2020   MDD (major depressive disorder), recurrent, in partial remission (HCC) 09/11/2020   Gait abnormality 07/29/2020   Neck pain 07/29/2020   Chronic right-sided low back pain with right-sided sciatica 07/29/2020   Urinary incontinence 07/29/2020   Right hip pain 07/29/2020   MDD (major depressive disorder), recurrent episode, moderate (HCC) 04/15/2020   Persistent atrial fibrillation (HCC) 09/20/2019   Secondary hypercoagulable state (HCC) 09/20/2019   Abnormal nuclear stress test    Spinal stenosis of cervical region 08/13/2019   Protrusion of cervical intervertebral disc 08/13/2019   Shoulder pain    Chronic combined systolic and diastolic heart failure (HCC)    Atrial fibrillation with RVR (HCC) 05/07/2019   Hypertension 05/07/2019   Hypothyroidism 05/07/2019   Hypomagnesemia 05/07/2019   Elevated troponin 05/07/2019    Barre Aydelott April Ma L Jaquise Faux PT, DPT 10/02/2020, 5:22 PM  [facility] [address] [place], [place], [place] Phone: [phone]   Fax:  [phone]  Name: Vipul Hagen Bohorquez MRN: Lucious Groves Date of Birth: 22-Feb-1961

## 2020-10-02 NOTE — Patient Instructions (Signed)
Epidermoid Cyst  An epidermoid cyst, also called an epidermal cyst, is a small lump under your skin. The cyst contains a substance called keratin. Do not try to pop or openthe cyst yourself. What are the causes? A blocked hair follicle. A hair that curls and re-enters the skin instead of growing straight out of the skin. A blocked pore. Irritated skin. An injury to the skin. Certain conditions that are passed along from parent to child. Human papillomavirus (HPV). This happens rarely when cysts occur on the bottom of the feet. Long-term sun damage to the skin. What increases the risk? Having acne. Being male. Having an injury to the skin. Being past puberty. Having certain conditions caused by genes (genetic disorder) What are the signs or symptoms? These cysts are usually harmless, but they can get infected. Symptoms of infection may include: Redness. Inflammation. Tenderness. Warmth. Fever. A bad-smelling substance that drains from the cyst. Pus that drains from the cyst. How is this treated? In many cases, epidermoid cysts go away on their own without treatment. If a cyst becomes infected, treatment may include: Opening and draining the cyst, done by a doctor. After draining, you may need minor surgery to remove the rest of the cyst. Antibiotic medicine. Shots of medicines (steroids) that help to reduce inflammation. Surgery to remove the cyst. Surgery may be done if the cyst: Becomes large. Bothers you. Has a chance of turning into cancer. Do not try to open a cyst yourself. Follow these instructions at home: Medicines Take over-the-counter and prescription medicines as told by your doctor. If you were prescribed an antibiotic medicine, take it as told by your doctor. Do not stop taking it even if you start to feel better. General instructions Keep the area around your cyst clean and dry. Wear loose, dry clothing. Avoid touching your cyst. Check your cyst every day  for signs of infection. Check for: Redness, swelling, or pain. Fluid or blood. Warmth. Pus or a bad smell. Keep all follow-up visits. How is this prevented? Wear clean, dry, clothing. Avoid wearing tight clothing. Keep your skin clean and dry. Take showers or baths every day. Contact a doctor if: Your cyst has symptoms of infection. Your condition does not improve or gets worse. You have a cyst that looks different from other cysts you have had. You have a fever. Get help right away if: Redness spreads from the cyst into the area close by. Summary An epidermoid cyst is a small lump under your skin. If a cyst becomes infected, treatment may include surgery to open and drain the cyst, or to remove it. Take over-the-counter and prescription medicines only as told by your doctor. Contact a doctor if your condition is not improving or is getting worse. Keep all follow-up visits. This information is not intended to replace advice given to you by your health care provider. Make sure you discuss any questions you have with your healthcare provider. Document Revised: 06/13/2019 Document Reviewed: 06/13/2019 Elsevier Patient Education  2022 Elsevier Inc.  

## 2020-10-02 NOTE — Progress Notes (Signed)
[facility] Patient [facility] [address] [place], [place]  [address] Phone:  [phone]   Fax:  [phone]   Established Patient Office Visit  Subjective:  Patient ID: Seth Snyder, male    DOB: [birth year]  Age: 60 y.o. MRN: [id number]  CC:  Chief Complaint  Patient presents with   Follow-up    3 month follow up; shoulder and back pain    HPI [facility] presents for follow up. He  has a past medical history of Anemia, Atrial fibrillation (HCC), Atypical chest pain (10/01/2020), Dizziness, Hypertension, and Low back pain.   He has a history of depression. He was seen by psychiatry; the MD retiring and has asked that I continue the current regimen. No additional changes are needed at this time.   He feels like his BP is better.  He continues to follow-up with cardiology.  He is planning to have surgery late summer or early fall. He is currently in PT. He continues to have shoulder neck and back pain.   Denies headache, dizziness, visual changes, shortness of breath, dyspnea on exertion, chest pain, nausea, vomiting or any edema.   Past Medical History:  Diagnosis Date   Anemia    Atrial fibrillation (HCC)    Atypical chest pain 10/01/2020   Dizziness    Hypertension    Low back pain     Past Surgical History:  Procedure Laterality Date   CARDIOVERSION N/A 05/10/2019   Procedure: CARDIOVERSION;  Surgeon: Lewayne Bunting, MD;  Location: [facility] ENDOSCOPY;  Service: Cardiovascular;  Laterality: N/A;   COLONOSCOPY  07/12/2013   LEFT HEART CATH AND CORONARY ANGIOGRAPHY N/A 08/30/2019   Procedure: LEFT HEART CATH AND CORONARY ANGIOGRAPHY;  Surgeon: Corky Crafts, MD;  Location: [facility] INVASIVE CV LAB;  Service: Cardiovascular;  Laterality: N/A;   TEE WITHOUT CARDIOVERSION N/A 05/10/2019   Procedure: TRANSESOPHAGEAL ECHOCARDIOGRAM (TEE);  Surgeon: Lewayne Bunting, MD;  Location: [facility] ENDOSCOPY;  Service: Cardiovascular;  Laterality: N/A;    Family History   Problem Relation Age of Onset   Diabetes Mother    Other Father        unsure of medical history   Diabetes Sister    Mental illness Sister    Mental illness Brother     Social History   Socioeconomic History   Marital status: Single    Spouse name: Not on file   Number of children: 1   Years of education: 8th grade   Highest education level: Not on file  Occupational History   Occupation: Disabled  Tobacco Use   Smoking status: Former    Types: Cigarettes    Quit date: 12/21/2018    Years since quitting: 1.7   Smokeless tobacco: Never  Vaping Use   Vaping Use: Never used  Substance and Sexual Activity   Alcohol use: Not Currently   Drug use: Never   Sexual activity: Not on file  Other Topics Concern   Not on file  Social History Narrative   Right-handed.   Lives at home with his daughter and son-in-law.   No daily use of caffeine.      Social Determinants of Health   Financial Resource Strain: Medium Risk   Difficulty of Paying Living Expenses: Somewhat hard  Food Insecurity: No Food Insecurity   Worried About Programme researcher, broadcasting/film/video in the Last Year: Never true   Ran Out of Food in the Last Year: Never true  Transportation Needs: No  Transportation Needs   Lack of Transportation (Medical): No   Lack of Transportation (Non-Medical): No  Physical Activity: Not on file  Stress: Not on file  Social Connections: Not on file  Intimate Partner Violence: Not on file    Outpatient Medications Prior to Visit  Medication Sig Dispense Refill   acetaminophen (TYLENOL) 500 MG tablet Take 1,000 mg by mouth daily as needed for mild pain.     albuterol (VENTOLIN HFA) 108 (90 Base) MCG/ACT inhaler Inhale 2 puffs into the lungs every 6 (six) hours as needed for wheezing or shortness of breath. 8 g 6   amiodarone (PACERONE) 200 MG tablet TAKE 1 TABLET BY MOUTH EVERY DAY 90 tablet 3   atorvastatin (LIPITOR) 40 MG tablet TAKE 1 TABLET (40 MG TOTAL) BY MOUTH DAILY AT 6 PM. 90  tablet 3   citalopram (CELEXA) 40 MG tablet Take 1 tablet (40 mg total) by mouth daily. 90 tablet 1   DULoxetine (CYMBALTA) 60 MG capsule TAKE 1 CAPSULE BY MOUTH EVERY DAY 90 capsule 3   ELIQUIS 5 MG TABS tablet TAKE 1 TABLET BY MOUTH TWICE A DAY 180 tablet 1   fluticasone-salmeterol (ADVAIR HFA) 115-21 MCG/ACT inhaler Inhale 2 puffs into the lungs 2 (two) times daily. 1 each 12   gabapentin (NEURONTIN) 300 MG capsule Take 3 capsules (900 mg total) by mouth at bedtime. (Patient taking differently: Take 300-600 mg by mouth See admin instructions. Takes 2 capsule in the morning and 1 capsule at night) 90 capsule 11   hydrALAZINE (APRESOLINE) 50 MG tablet Take 50 mg by mouth as directed. TAKE 1/2 TABLET AS NEEDED FOR BLOOD PRESSURE ABOVE 150     pantoprazole (PROTONIX) 40 MG tablet Take 1 tablet (40 mg total) by mouth daily. 90 tablet 3   traZODone (DESYREL) 50 MG tablet Take half tablet at bedtime as needed for sleep. 90 tablet 1   amLODipine (NORVASC) 2.5 MG tablet Take 1 tablet (2.5 mg total) by mouth daily. 90 tablet 3   carvedilol (COREG) 25 MG tablet Take 25 mg by mouth 2 (two) times daily.     No facility-administered medications prior to visit.    Allergies  Allergen Reactions   Entresto [Sacubitril-Valsartan]     Abnormal kidney functions     ROS Review of Systems    Objective:    Physical Exam HENT:     Head: Normocephalic and atraumatic.     Nose: Nose normal.     Mouth/Throat:     Mouth: Mucous membranes are moist.  Cardiovascular:     Rate and Rhythm: Normal rate and regular rhythm.     Pulses: Normal pulses.     Heart sounds: Normal heart sounds.  Pulmonary:     Effort: Pulmonary effort is normal.     Breath sounds: Normal breath sounds.  Abdominal:     Palpations: Abdomen is soft.  Musculoskeletal:     Cervical back: Normal range of motion.     Comments: Walker in use  Skin:    General: Skin is warm and dry.     Capillary Refill: Capillary refill takes less  than 2 seconds.  Neurological:     General: No focal deficit present.     Mental Status: He is alert and oriented to person, place, and time.  Psychiatric:        Mood and Affect: Mood normal.        Behavior: Behavior normal.        Thought Content:  Thought content normal.        Judgment: Judgment normal.    BP 105/73   Pulse 69   Temp (!) 97.3 F (36.3 C)   Ht 5\' 7"  (1.702 m)   Wt 157 lb 0.2 oz (71.2 kg)   SpO2 93%   BMI 24.59 kg/m  Wt Readings from Last 3 Encounters:  10/02/20 157 lb 0.2 oz (71.2 kg)  10/01/20 155 lb (70.3 kg)  09/18/20 155 lb (70.3 kg)     Health Maintenance Due  Topic Date Due   Pneumococcal Vaccine 32-43 Years old (2 - PCV) 04/24/2016   COVID-19 Vaccine (4 - Booster for Pfizer series) 07/30/2020    There are no preventive care reminders to display for this patient.   Lab Results  Component Value Date   WBC 6.1 06/20/2020   HGB 11.5 (L) 06/20/2020   HCT 36.4 (L) 06/20/2020   MCV 89.2 06/20/2020   PLT 220 06/20/2020      Assessment & Plan:   Problem List Items Addressed This Visit       Cardiovascular and Mediastinum   Atrial fibrillation with RVR (HCC) Stable continue with current regimen and follow-up with cardiology as scheduled   Relevant Medications   carvedilol (COREG) 25 MG tablet   Hypertension   Relevant Medications   carvedilol (COREG) 25 MG tablet   Other Relevant Orders   Comp. Metabolic Panel (12) (Completed)     Other   Spinal stenosis of cervical region Persistent planning on having surgery near future   Other Visit Diagnoses     Severe episode of recurrent major depressive disorder, without psychotic features (HCC)    -  Primary Stable we will continue with current regimen and regular follow-up   Chronic bilateral low back pain with right-sided sciatica     Persistent planning on having surgery in the future   Tinea corporis    Noted on bilateral upper arms   Screening for thyroid disorder       Relevant  Orders   TSH (Completed)   Screening for cholesterol level       Relevant Orders   Lipid panel (Completed)       No orders of the defined types were placed in this encounter.   Follow-up: Return in about 3 months (around 01/02/2021) for Follow-up for depression [address]92[address].    01/04/2021, NP

## 2020-10-03 ENCOUNTER — Other Ambulatory Visit: Payer: Self-pay | Admitting: Nurse Practitioner

## 2020-10-03 DIAGNOSIS — E039 Hypothyroidism, unspecified: Secondary | ICD-10-CM

## 2020-10-03 LAB — COMP. METABOLIC PANEL (12)
AST: 35 IU/L (ref 0–40)
Albumin/Globulin Ratio: 1.6 (ref 1.2–2.2)
Albumin: 4.2 g/dL (ref 3.8–4.9)
Alkaline Phosphatase: 89 IU/L (ref 44–121)
BUN/Creatinine Ratio: 15 (ref 9–20)
BUN: 22 mg/dL (ref 6–24)
Bilirubin Total: 0.4 mg/dL (ref 0.0–1.2)
Calcium: 9 mg/dL (ref 8.7–10.2)
Chloride: 104 mmol/L (ref 96–106)
Creatinine, Ser: 1.44 mg/dL — ABNORMAL HIGH (ref 0.76–1.27)
Globulin, Total: 2.6 g/dL (ref 1.5–4.5)
Glucose: 87 mg/dL (ref 65–99)
Potassium: 4.3 mmol/L (ref 3.5–5.2)
Sodium: 136 mmol/L (ref 134–144)
Total Protein: 6.8 g/dL (ref 6.0–8.5)
eGFR: 56 mL/min/{1.73_m2} — ABNORMAL LOW (ref >59)

## 2020-10-03 LAB — LIPID PANEL
Chol/HDL Ratio: 2.4 ratio (ref 0.0–5.0)
Cholesterol, Total: 128 mg/dL (ref 100–199)
HDL: 53 mg/dL (ref >39)
LDL Chol Calc (NIH): 52 mg/dL (ref 0–99)
Triglycerides: 131 mg/dL (ref 0–149)
VLDL Cholesterol Cal: 23 mg/dL (ref 5–40)

## 2020-10-03 LAB — TSH: TSH: 17.1 u[IU]/mL — ABNORMAL HIGH (ref 0.450–4.500)

## 2020-10-03 MED ORDER — LEVOTHYROXINE SODIUM 25 MCG PO TABS: 25.0000 ug | ORAL_TABLET | Freq: Every day | ORAL | 0 refills | Status: DC

## 2020-10-05 ENCOUNTER — Encounter (HOSPITAL_BASED_OUTPATIENT_CLINIC_OR_DEPARTMENT_OTHER): Payer: Self-pay | Admitting: Cardiovascular Disease

## 2020-10-06 ENCOUNTER — Other Ambulatory Visit: Payer: Self-pay

## 2020-10-06 ENCOUNTER — Ambulatory Visit: Payer: Medicaid Other | Admitting: Physical Therapy

## 2020-10-06 ENCOUNTER — Encounter: Payer: Self-pay | Admitting: Physical Therapy

## 2020-10-06 DIAGNOSIS — M25551 Pain in right hip: Secondary | ICD-10-CM

## 2020-10-06 DIAGNOSIS — G8929 Other chronic pain: Secondary | ICD-10-CM

## 2020-10-06 DIAGNOSIS — M542 Cervicalgia: Secondary | ICD-10-CM

## 2020-10-06 DIAGNOSIS — M545 Low back pain, unspecified: Secondary | ICD-10-CM

## 2020-10-06 DIAGNOSIS — R2689 Other abnormalities of gait and mobility: Secondary | ICD-10-CM

## 2020-10-06 DIAGNOSIS — R293 Abnormal posture: Secondary | ICD-10-CM

## 2020-10-06 DIAGNOSIS — M6281 Muscle weakness (generalized): Secondary | ICD-10-CM

## 2020-10-06 NOTE — Therapy (Signed)
[facility] [address] [place], [place], [address] Phone: [phone]   Fax:  [phone]  Physical Therapy Treatment  Patient Details  Name: Seth Snyder MRN: [id number] Date of Birth: 1960-08-15 Referring Provider (PT): Levert Feinstein, MD   Encounter Date: 10/06/2020   PT End of Session - 10/06/20 1638     Visit Number 3    Number of Visits 9    Authorization Type [employer] (VL: 27)    PT Start Time 1600    PT Stop Time 1645    PT Time Calculation (min) 45 min    Activity Tolerance Patient limited by pain    Behavior During Therapy [facility] for tasks assessed/performed             Past Medical History:  Diagnosis Date   Anemia    Atrial fibrillation (HCC)    Atypical chest pain 10/01/2020   Dizziness    Hypertension    Low back pain     Past Surgical History:  Procedure Laterality Date   CARDIOVERSION N/A 05/10/2019   Procedure: CARDIOVERSION;  Surgeon: Lewayne Bunting, MD;  Location: [facility] ENDOSCOPY;  Service: Cardiovascular;  Laterality: N/A;   COLONOSCOPY  07/12/2013   LEFT HEART CATH AND CORONARY ANGIOGRAPHY N/A 08/30/2019   Procedure: LEFT HEART CATH AND CORONARY ANGIOGRAPHY;  Surgeon: Corky Crafts, MD;  Location: [facility] INVASIVE CV LAB;  Service: Cardiovascular;  Laterality: N/A;   TEE WITHOUT CARDIOVERSION N/A 05/10/2019   Procedure: TRANSESOPHAGEAL ECHOCARDIOGRAM (TEE);  Surgeon: Lewayne Bunting, MD;  Location: [facility] ENDOSCOPY;  Service: Cardiovascular;  Laterality: N/A;    There were no vitals filed for this visit.   Subjective Assessment - 10/06/20 1605     Subjective Pt reports that he is feeling increased pain today in R hip and neck pain    Currently in Pain? Yes    Pain Score 7     Pain Location Back                               [facility] Adult PT Treatment/Exercise - 10/06/20 0001       Lumbar Exercises: Stretches   Passive Hamstring Stretch Right;Left;4 reps;20  seconds    Single Knee to Chest Stretch Right;Left;4 reps;20 seconds    Lower Trunk Rotation 5 reps;10 seconds   very limited ROM + pain   Piriformis Stretch Right;Left;4 reps;20 seconds      Lumbar Exercises: Aerobic   UBE (Upper Arm Bike) L1 x 2 min   discontinued d/t reports of increased arm pain   Nustep L3 x 6 min   very slow with frequent breaks required     Modalities   Modalities Electrical Stimulation;Moist Heat      Moist Heat Therapy   Number Minutes Moist Heat 12 Minutes    Moist Heat Location Lumbar Spine      Electrical Stimulation   Electrical Stimulation Location R lumbar    Electrical Stimulation Action IFC    Electrical Stimulation Parameters supine    Electrical Stimulation Goals Pain                      PT Short Term Goals - 10/02/20 1717       PT SHORT TERM GOAL #1   Title Patient will be independent with initial HEP for strength/stretching/ROM (ALL STGs: at 4th Visit)    Baseline no HEP  established    Time 4    Period Weeks    Status On-going      PT SHORT TERM GOAL #2   Title to be assessed and LTG to be set as applicable    Baseline TBA    Time 4    Period Weeks    Status Achieved               PT Long Term Goals - 10/02/20 1717       PT LONG TERM GOAL #1   Title Patient will be independent with final HEP for strength/balance/stretching and daily walking program (ALL LTGs Due: at 9th visit)    Baseline no HEP established    Time 8    Period Weeks    Status New      PT LONG TERM GOAL #2   Title Pt will be able to tolerate ambulating at least 595' during 6 MWT with rollator to demo MDIC and improved community mobility    Baseline 395' with rollator    Time 8    Period Weeks    Status New    Target Date 11/27/20      PT LONG TERM GOAL #3   Title Pt will have improved Oswestry Low Back Disability score by at least 11 points to demonstrate MDC    Baseline 37/50    Time 8    Period Weeks    Status New     Target Date 11/27/20      PT LONG TERM GOAL #4   Title Patient will improve cervical ROM by 8 degrees in all direction to demonstrate improved ability to scan environment    Baseline see flowsheet    Time 8    Period Weeks    Status New      PT LONG TERM GOAL #5   Title Patient will be able to ambulate x 100 ft with LRAD vs. No AD and Mod I for improved household mobility    Baseline 50 ft [facility] Supervision    Time 8    Period Weeks    Status New                   Plan - 10/06/20 1638     Clinical Impression Statement Pt with very little tolerance to TE d/t increase in R hip/LB/cervical pain with movement. Unable to tolerate more than 2 min on UBE. Very slow and guarded on nustep. Reporting significantly increased pain with LTR. As a result, spent much of session focusing on manual stretching. Pt with R sided LB spasms with end range lumbar/LE stretches. Very tight LB/hips. Trialed estim and heat at end for pain and muscle spasm relief. Education on importance of gentle, daily lumbar stretches with VU and agreement. Will need very gentle progression of TE.    PT Treatment/Interventions ADLs/Self Care Home Management;Aquatic Therapy;Cryotherapy;Electrical Stimulation;Iontophoresis 4mg /ml Dexamethasone;Moist Heat;DME Instruction;Stair training;Functional mobility training;Therapeutic activities;Gait training;Therapeutic exercise;Balance training;Neuromuscular re-education;Patient/family education;Orthotic Fit/Training;Manual techniques;Passive range of motion;Dry needling;Spinal Manipulations;Joint Manipulations    PT Next Visit Plan Initiate HEP focused on stretching for cervical/low back. Work on core strengthening as pt is able to tolerate. Work on pelvic/low back AROM.    Consulted and Agree with Plan of Care Patient             Patient will benefit from skilled therapeutic intervention in order to improve the following deficits and impairments:  Abnormal gait, Decreased  balance, Difficulty walking, Increased muscle spasms, Impaired sensation,  Pain, Postural dysfunction, Impaired flexibility, Decreased strength, Decreased knowledge of use of DME, Decreased activity tolerance, Decreased coordination, Decreased range of motion, Decreased endurance  Visit Diagnosis: Abnormal posture  Chronic right-sided low back pain, unspecified whether sciatica present  Other abnormalities of gait and mobility  Muscle weakness (generalized)  Cervicalgia  Pain in right hip     Problem List Patient Active Problem List   Diagnosis Date Noted   Atypical chest pain 10/01/2020   Chronic kidney disease 09/18/2020   MDD (major depressive disorder), recurrent, in partial remission (HCC) 09/11/2020   Gait abnormality 07/29/2020   Neck pain 07/29/2020   Chronic right-sided low back pain with right-sided sciatica 07/29/2020   Urinary incontinence 07/29/2020   Right hip pain 07/29/2020   MDD (major depressive disorder), recurrent episode, moderate (HCC) 04/15/2020   Persistent atrial fibrillation (HCC) 09/20/2019   Secondary hypercoagulable state (HCC) 09/20/2019   Abnormal nuclear stress test    Spinal stenosis of cervical region 08/13/2019   Protrusion of cervical intervertebral disc 08/13/2019   Shoulder pain    Chronic combined systolic and diastolic heart failure (HCC)    Atrial fibrillation with RVR (HCC) 05/07/2019   Hypertension 05/07/2019   Hypothyroidism 05/07/2019   Hypomagnesemia 05/07/2019   Elevated troponin 05/07/2019   Lysle Rubens, PT, DPT Maryanna Shape Kathi Dohn 10/06/2020, 4:41 PM  [facility] [address] [place], [place], [address] Phone: [phone]   Fax:  [phone]  Name: Seth Snyder MRN: [id number] Date of Birth: 02/03/1961

## 2020-10-07 ENCOUNTER — Telehealth: Payer: Self-pay

## 2020-10-07 NOTE — Telephone Encounter (Signed)
Voicemail full unable to leave message

## 2020-10-07 NOTE — Telephone Encounter (Signed)
-----   Message from Barbette Merino, NP sent at 10/03/2020  2:32 PM EDT ----- I have added levothyroxine 25 mcg for his thyroid level;  all other labs within normal range I have also placed orders for lab only in 4 to 6 weeks, thanks

## 2020-10-07 NOTE — Telephone Encounter (Signed)
Patient returned phone call. Lab results given, lab appointment scheduled 8/24. No additional questions.

## 2020-10-09 ENCOUNTER — Ambulatory Visit: Payer: Medicaid Other | Admitting: Physical Therapy

## 2020-10-14 ENCOUNTER — Encounter: Payer: Self-pay | Admitting: Physical Therapy

## 2020-10-14 ENCOUNTER — Other Ambulatory Visit: Payer: Self-pay

## 2020-10-14 ENCOUNTER — Ambulatory Visit: Payer: Medicaid Other | Admitting: Physical Therapy

## 2020-10-14 DIAGNOSIS — M542 Cervicalgia: Secondary | ICD-10-CM

## 2020-10-14 DIAGNOSIS — M6281 Muscle weakness (generalized): Secondary | ICD-10-CM

## 2020-10-14 DIAGNOSIS — M25551 Pain in right hip: Secondary | ICD-10-CM

## 2020-10-14 DIAGNOSIS — R293 Abnormal posture: Secondary | ICD-10-CM

## 2020-10-14 DIAGNOSIS — M545 Low back pain, unspecified: Secondary | ICD-10-CM

## 2020-10-14 DIAGNOSIS — G8929 Other chronic pain: Secondary | ICD-10-CM

## 2020-10-14 DIAGNOSIS — R2689 Other abnormalities of gait and mobility: Secondary | ICD-10-CM

## 2020-10-14 NOTE — Therapy (Signed)
[facility] [place] [address] [facility] [place], [place], [address] Phone: [phone]   Fax:  [phone]  Physical Therapy Treatment  Patient Details  Name: Seth Snyder MRN: [id number] Date of Birth: 1961-01-29 Referring Provider (PT): Levert Feinstein, MD   Encounter Date: 10/14/2020   PT End of Session - 10/14/20 1340     Visit Number 4    Authorization Type [employer] (VL: 27)    PT Start Time 1258    PT Stop Time 1335    PT Time Calculation (min) 37 min    Activity Tolerance Patient tolerated treatment well;Patient limited by pain    Behavior During Therapy [facility] for tasks assessed/performed             Past Medical History:  Diagnosis Date   Anemia    Atrial fibrillation (HCC)    Atypical chest pain 10/01/2020   Dizziness    Hypertension    Low back pain     Past Surgical History:  Procedure Laterality Date   CARDIOVERSION N/A 05/10/2019   Procedure: CARDIOVERSION;  Surgeon: Lewayne Bunting, MD;  Location: [facility] ENDOSCOPY;  Service: Cardiovascular;  Laterality: N/A;   COLONOSCOPY  07/12/2013   LEFT HEART CATH AND CORONARY ANGIOGRAPHY N/A 08/30/2019   Procedure: LEFT HEART CATH AND CORONARY ANGIOGRAPHY;  Surgeon: Corky Crafts, MD;  Location: [facility] INVASIVE CV LAB;  Service: Cardiovascular;  Laterality: N/A;   TEE WITHOUT CARDIOVERSION N/A 05/10/2019   Procedure: TRANSESOPHAGEAL ECHOCARDIOGRAM (TEE);  Surgeon: Lewayne Bunting, MD;  Location: [facility] ENDOSCOPY;  Service: Cardiovascular;  Laterality: N/A;    There were no vitals filed for this visit.   Subjective Assessment - 10/14/20 1258     Subjective Pain on R side hip and neck. Activity increases pain.    Diagnostic tests EMG nerve conduction study showed no significant abnormality, in specific, there is no evidence of bilateral lumbosacral radiculopathy, or right cervical radiculopathy.MRI of cervical spine showed multilevel degenerative changes, most prominent  lower cervical region, C5-6, C6-T1, with mild canal stenosis, no cord signal abnormality, variable  degree of foraminal narrowing. MRI lumbar spine showed multilevel degenerative changes, no evidence of spinal canal or nerve root compression    Currently in Pain? Yes    Pain Score 8     Pain Location --   hip and Neck   Pain Orientation Right                               [facility] Adult PT Treatment/Exercise - 10/14/20 0001       Ambulation/Gait   Ambulation/Gait Yes    Ambulation/Gait Assistance 6: Modified independent (Device/Increase time)    Ambulation Distance (Feet) 200 Feet    Assistive device Rollator    Gait Pattern Step-through pattern    Ambulation Surface Level;Indoor    Gait Comments as warm up      Lumbar Exercises: Stretches   Passive Hamstring Stretch Right;5 reps;10 seconds    Single Knee to Chest Stretch Right;4 reps;10 seconds;20 seconds    Lower Trunk Rotation 5 reps;20 seconds;10 seconds   decrease pain when going to his L side   Piriformis Stretch Right;4 reps;10 seconds      Lumbar Exercises: Seated   Long Arc Quad on Chair Both;2 sets;10 reps;AROM    Other Seated Lumbar Exercises Rows yellow 2x10      Manual Therapy   Manual Therapy Soft tissue mobilization  Soft tissue mobilization Lumbar para spinales and QL area R side                      PT Short Term Goals - 10/02/20 1717       PT SHORT TERM GOAL #1   Title Patient will be independent with initial HEP for strength/stretching/ROM (ALL STGs: at 4th Visit)    Baseline no HEP established    Time 4    Period Weeks    Status On-going      PT SHORT TERM GOAL #2   Title to be assessed and LTG to be set as applicable    Baseline TBA    Time 4    Period Weeks    Status Achieved               PT Long Term Goals - 10/02/20 1717       PT LONG TERM GOAL #1   Title Patient will be independent with final HEP for strength/balance/stretching and daily  walking program (ALL LTGs Due: at 9th visit)    Baseline no HEP established    Time 8    Period Weeks    Status New      PT LONG TERM GOAL #2   Title Pt will be able to tolerate ambulating at least 595' during 6 MWT with rollator to demo MDIC and improved community mobility    Baseline 395' with rollator    Time 8    Period Weeks    Status New    Target Date 11/27/20      PT LONG TERM GOAL #3   Title Pt will have improved Oswestry Low Back Disability score by at least 11 points to demonstrate MDC    Baseline 37/50    Time 8    Period Weeks    Status New    Target Date 11/27/20      PT LONG TERM GOAL #4   Title Patient will improve cervical ROM by 8 degrees in all direction to demonstrate improved ability to scan environment    Baseline see flowsheet    Time 8    Period Weeks    Status New      PT LONG TERM GOAL #5   Title Patient will be able to ambulate x 100 ft with LRAD vs. No AD and Mod I for improved household mobility    Baseline 50 ft [facility] Supervision    Time 8    Period Weeks    Status New                   Plan - 10/14/20 1341     Clinical Impression Statement Pt enters clinic reporting increase R hip/LB/cervical pain. Gait to utilized as warm up cause aerobic machines cause increase pain. He did do well tolerating some light exercises. R side of body was weaker than R with all interventions. Pain reported in R QL area with LAQ. Positive response to STM and LE stretching more so with lumbar rotations.    Personal Factors and Comorbidities Comorbidity 3+;Time since onset of injury/illness/exacerbation    Comorbidities HTN, A-Fib, Anemia, LBP    Examination-Activity Limitations Bed Mobility;Transfers;Stairs;Stand;Locomotion Level;Squat    Examination-Participation Restrictions Community Activity;Yard Work;Occupation    Stability/Clinical Decision Making Stable/Uncomplicated    Rehab Potential Good    PT Frequency 1x / week    PT Duration 8 weeks    PT  Treatment/Interventions ADLs/Self Care Home  Management;Aquatic Therapy;Cryotherapy;Electrical Stimulation;Iontophoresis 4mg /ml Dexamethasone;Moist Heat;DME Instruction;Stair training;Functional mobility training;Therapeutic activities;Gait training;Therapeutic exercise;Balance training;Neuromuscular re-education;Patient/family education;Orthotic Fit/Training;Manual techniques;Passive range of motion;Dry needling;Spinal Manipulations;Joint Manipulations    PT Next Visit Plan Initiate HEP focused on stretching for cervical/low back. Work on core strengthening as pt is able to tolerate. Work on pelvic/low back AROM.             Patient will benefit from skilled therapeutic intervention in order to improve the following deficits and impairments:  Abnormal gait, Decreased balance, Difficulty walking, Increased muscle spasms, Impaired sensation, Pain, Postural dysfunction, Impaired flexibility, Decreased strength, Decreased knowledge of use of DME, Decreased activity tolerance, Decreased coordination, Decreased range of motion, Decreased endurance  Visit Diagnosis: Other abnormalities of gait and mobility  Muscle weakness (generalized)  Cervicalgia  Pain in right hip  Chronic right-sided low back pain, unspecified whether sciatica present  Abnormal posture     Problem List Patient Active Problem List   Diagnosis Date Noted   Atypical chest pain 10/01/2020   Chronic kidney disease 09/18/2020   MDD (major depressive disorder), recurrent, in partial remission (HCC) 09/11/2020   Gait abnormality 07/29/2020   Neck pain 07/29/2020   Chronic right-sided low back pain with right-sided sciatica 07/29/2020   Urinary incontinence 07/29/2020   Right hip pain 07/29/2020   MDD (major depressive disorder), recurrent episode, moderate (HCC) 04/15/2020   Persistent atrial fibrillation (HCC) 09/20/2019   Secondary hypercoagulable state (HCC) 09/20/2019   Abnormal nuclear stress test    Spinal  stenosis of cervical region 08/13/2019   Protrusion of cervical intervertebral disc 08/13/2019   Shoulder pain    Chronic combined systolic and diastolic heart failure (HCC)    Atrial fibrillation with RVR (HCC) 05/07/2019   Hypertension 05/07/2019   Hypothyroidism 05/07/2019   Hypomagnesemia 05/07/2019   Elevated troponin 05/07/2019    05/09/2019 10/14/2020, 1:44 PM  [facility] [place] [facility] [address] [place], [place], [place] Phone: [phone]   Fax:  [phone]  Name: Ichael Daunte Oestreich MRN: Lucious Groves Date of Birth: Jun 06, 1960

## 2020-10-16 ENCOUNTER — Ambulatory Visit: Payer: Medicaid Other

## 2020-10-16 ENCOUNTER — Other Ambulatory Visit: Payer: Self-pay

## 2020-10-16 DIAGNOSIS — M6281 Muscle weakness (generalized): Secondary | ICD-10-CM

## 2020-10-16 DIAGNOSIS — R293 Abnormal posture: Secondary | ICD-10-CM

## 2020-10-16 DIAGNOSIS — M25551 Pain in right hip: Secondary | ICD-10-CM

## 2020-10-16 DIAGNOSIS — M545 Low back pain, unspecified: Secondary | ICD-10-CM

## 2020-10-16 DIAGNOSIS — M542 Cervicalgia: Secondary | ICD-10-CM

## 2020-10-16 DIAGNOSIS — G8929 Other chronic pain: Secondary | ICD-10-CM

## 2020-10-16 DIAGNOSIS — R2689 Other abnormalities of gait and mobility: Secondary | ICD-10-CM

## 2020-10-16 NOTE — Therapy (Signed)
[facility] [place] [facility] [address] [place], [place], [address] Phone: [phone]   Fax:  [phone]  Physical Therapy Treatment  Patient Details  Name: Seth Snyder MRN: [id number] Date of Birth: Sep 05, 1960 Referring Provider (PT): Levert Feinstein, MD   Encounter Date: 10/16/2020   PT End of Session - 10/16/20 1246     Visit Number 5    Number of Visits 9    Authorization Type [employer] (VL: 27)    PT Start Time 1105    PT Stop Time 1145    PT Time Calculation (min) 40 min    Activity Tolerance Patient tolerated treatment well;Patient limited by pain    Behavior During Therapy [facility] for tasks assessed/performed             Past Medical History:  Diagnosis Date   Anemia    Atrial fibrillation (HCC)    Atypical chest pain 10/01/2020   Dizziness    Hypertension    Low back pain     Past Surgical History:  Procedure Laterality Date   CARDIOVERSION N/A 05/10/2019   Procedure: CARDIOVERSION;  Surgeon: Lewayne Bunting, MD;  Location: [facility] ENDOSCOPY;  Service: Cardiovascular;  Laterality: N/A;   COLONOSCOPY  07/12/2013   LEFT HEART CATH AND CORONARY ANGIOGRAPHY N/A 08/30/2019   Procedure: LEFT HEART CATH AND CORONARY ANGIOGRAPHY;  Surgeon: Corky Crafts, MD;  Location: [facility] INVASIVE CV LAB;  Service: Cardiovascular;  Laterality: N/A;   TEE WITHOUT CARDIOVERSION N/A 05/10/2019   Procedure: TRANSESOPHAGEAL ECHOCARDIOGRAM (TEE);  Surgeon: Lewayne Bunting, MD;  Location: [facility] ENDOSCOPY;  Service: Cardiovascular;  Laterality: N/A;    There were no vitals filed for this visit.                      [facility] Adult PT Treatment/Exercise - 10/16/20 0001       Ambulation/Gait   Ambulation/Gait Yes    Ambulation/Gait Assistance 6: Modified independent (Device/Increase time)    Ambulation Distance (Feet) 375 Feet    Assistive device Rollator    Gait Pattern Step-through pattern    Ambulation Surface  Level;Indoor    Gait Comments as warm up   machine causes pain     Lumbar Exercises: Stretches   Passive Hamstring Stretch Right;5 reps;10 seconds    Lower Trunk Rotation Limitations slow rotations x 10 B    Piriformis Stretch Right;30 seconds    Figure 4 Stretch 30 seconds    Figure 4 Stretch Limitations Right      Lumbar Exercises: Seated   Long Arc Quad on Chair Both;2 sets;10 reps;AROM    LAQ on Chair Weights (lbs) 2    Other Seated Lumbar Exercises Rows at column RTB x20   verbal and tactile cues for upright posture and optimal form   Other Seated Lumbar Exercises Seated pelvic tilt on dyna disc x15, on dyna disc B palloff press and rotation with YTB x 10   max verbal/tactile cues for Paloff press and rotations to correct form and prevent lean                   PT Education - 10/16/20 1145     Education Details Edu on posture affecting back pain    Person(s) Educated Patient    Methods Explanation;Demonstration;Tactile cues;Verbal cues    Comprehension Verbalized understanding;Returned demonstration;Verbal cues required;Tactile cues required              PT Short Term  Goals - 10/02/20 1717       PT SHORT TERM GOAL #1   Title Patient will be independent with initial HEP for strength/stretching/ROM (ALL STGs: at 4th Visit)    Baseline no HEP established    Time 4    Period Weeks    Status On-going      PT SHORT TERM GOAL #2   Title to be assessed and LTG to be set as applicable    Baseline TBA    Time 4    Period Weeks    Status Achieved               PT Long Term Goals - 10/02/20 1717       PT LONG TERM GOAL #1   Title Patient will be independent with final HEP for strength/balance/stretching and daily walking program (ALL LTGs Due: at 9th visit)    Baseline no HEP established    Time 8    Period Weeks    Status New      PT LONG TERM GOAL #2   Title Pt will be able to tolerate ambulating at least 595' during 6 MWT with rollator to  demo MDIC and improved community mobility    Baseline 395' with rollator    Time 8    Period Weeks    Status New    Target Date 11/27/20      PT LONG TERM GOAL #3   Title Pt will have improved Oswestry Low Back Disability score by at least 11 points to demonstrate MDC    Baseline 37/50    Time 8    Period Weeks    Status New    Target Date 11/27/20      PT LONG TERM GOAL #4   Title Patient will improve cervical ROM by 8 degrees in all direction to demonstrate improved ability to scan environment    Baseline see flowsheet    Time 8    Period Weeks    Status New      PT LONG TERM GOAL #5   Title Patient will be able to ambulate x 100 ft with LRAD vs. No AD and Mod I for improved household mobility    Baseline 50 ft [facility] Supervision    Time 8    Period Weeks    Status New                   Plan - 10/16/20 1247     Clinical Impression Statement Pt presents with continued mid back pain and R hip pain. He tolerated session well with no adverse reactions or report of increased pain, only fatigue from exercise. Increased core stabilization today, sitting on dyna disc to perform anterior/posterior pelvic tilts, as well as Palloff press + rotation. Pt was slightly weaker to R side, requiring max verbal/visual cueing from interpreter and visual/tactile cueing from PT to complete exercises appropriately and maintain erect posture. Pt required frequent rest breaks to address fatigue. Ended session with stretching and instructed pt to assess how his back feels over the weekend before next session.    Personal Factors and Comorbidities Comorbidity 3+;Time since onset of injury/illness/exacerbation    Comorbidities HTN, A-Fib, Anemia, LBP    Examination-Activity Limitations Bed Mobility;Transfers;Stairs;Stand;Locomotion Level;Squat    Examination-Participation Restrictions Community Activity;Yard Work;Occupation    Stability/Clinical Decision Making Stable/Uncomplicated    Rehab  Potential Good    PT Frequency 1x / week    PT Duration 8 weeks  PT Treatment/Interventions ADLs/Self Care Home Management;Aquatic Therapy;Cryotherapy;Electrical Stimulation;Iontophoresis 4mg /ml Dexamethasone;Moist Heat;DME Instruction;Stair training;Functional mobility training;Therapeutic activities;Gait training;Therapeutic exercise;Balance training;Neuromuscular re-education;Patient/family education;Orthotic Fit/Training;Manual techniques;Passive range of motion;Dry needling;Spinal Manipulations;Joint Manipulations    PT Next Visit Plan Initiate HEP focused on stretching for cervical/low back. Work on core strengthening as pt is able to tolerate. Work on pelvic/low back AROM.    PT Home Exercise Plan Access Code    Consulted and Agree with Plan of Care Patient             Patient will benefit from skilled therapeutic intervention in order to improve the following deficits and impairments:  Abnormal gait, Decreased balance, Difficulty walking, Increased muscle spasms, Impaired sensation, Pain, Postural dysfunction, Impaired flexibility, Decreased strength, Decreased knowledge of use of DME, Decreased activity tolerance, Decreased coordination, Decreased range of motion, Decreased endurance  Visit Diagnosis: Other abnormalities of gait and mobility  Muscle weakness (generalized)  Cervicalgia  Pain in right hip  Chronic right-sided low back pain, unspecified whether sciatica present  Abnormal posture     Problem List Patient Active Problem List   Diagnosis Date Noted   Atypical chest pain 10/01/2020   Chronic kidney disease 09/18/2020   MDD (major depressive disorder), recurrent, in partial remission (HCC) 09/11/2020   Gait abnormality 07/29/2020   Neck pain 07/29/2020   Chronic right-sided low back pain with right-sided sciatica 07/29/2020   Urinary incontinence 07/29/2020   Right hip pain 07/29/2020   MDD (major depressive disorder), recurrent episode,  moderate (HCC) 04/15/2020   Persistent atrial fibrillation (HCC) 09/20/2019   Secondary hypercoagulable state (HCC) 09/20/2019   Abnormal nuclear stress test    Spinal stenosis of cervical region 08/13/2019   Protrusion of cervical intervertebral disc 08/13/2019   Shoulder pain    Chronic combined systolic and diastolic heart failure (HCC)    Atrial fibrillation with RVR (HCC) 05/07/2019   Hypertension 05/07/2019   Hypothyroidism 05/07/2019   Hypomagnesemia 05/07/2019   Elevated troponin 05/07/2019    05/09/2019, PT, DPT 10/16/2020, 12:54 PM  [facility] [place] [address] [place], [place], [place] Phone: [phone]   Fax:  [phone]  Name: Seth Snyder MRN: Lucious Groves Date of Birth: 02-19-1961

## 2020-10-17 NOTE — Progress Notes (Signed)
PCP:  Barbette Merino, NP Primary Cardiologist: Chilton Si, MD Electrophysiologist: Hillis Range, MD   Nepali translator present  Seth Snyder is a 60 y.o. male seen today for Hillis Range, MD for routine electrophysiology followup.  Since last being seen in our clinic the patient reports doing well overall. He has intermittent palpitations, but "much less than before". Reports palpitations only about once a week.  Sometimes feels lightheadedness briefly when standing after taking his hydralazine. Mostly, he remains limited from his significant radiculopathy with continued symptoms in R neck, shoulder, back, thoracic area, flank, and hip. He is pending surgical evaluation in September. He denies exertional chest pain, dyspnea, PND, orthopnea, nausea, vomiting, syncope, edema, weight gain, or early satiety.  Past Medical History:  Diagnosis Date   Anemia    Atrial fibrillation (HCC)    Atypical chest pain 10/01/2020   Dizziness    Hypertension    Low back pain    Past Surgical History:  Procedure Laterality Date   CARDIOVERSION N/A 05/10/2019   Procedure: CARDIOVERSION;  Surgeon: Lewayne Bunting, MD;  Location: [facility] ENDOSCOPY;  Service: Cardiovascular;  Laterality: N/A;   COLONOSCOPY  07/12/2013   LEFT HEART CATH AND CORONARY ANGIOGRAPHY N/A 08/30/2019   Procedure: LEFT HEART CATH AND CORONARY ANGIOGRAPHY;  Surgeon: Corky Crafts, MD;  Location: [facility] INVASIVE CV LAB;  Service: Cardiovascular;  Laterality: N/A;   TEE WITHOUT CARDIOVERSION N/A 05/10/2019   Procedure: TRANSESOPHAGEAL ECHOCARDIOGRAM (TEE);  Surgeon: Lewayne Bunting, MD;  Location: [facility] ENDOSCOPY;  Service: Cardiovascular;  Laterality: N/A;    Current Outpatient Medications  Medication Sig Dispense Refill   acetaminophen (TYLENOL) 500 MG tablet Take 1,000 mg by mouth daily as needed for mild pain.     albuterol (VENTOLIN HFA) 108 (90 Base) MCG/ACT inhaler Inhale 2 puffs into the lungs every 6 (six) hours as  needed for wheezing or shortness of breath. 8 g 6   amLODipine (NORVASC) 2.5 MG tablet Take 1 tablet (2.5 mg total) by mouth daily. 90 tablet 3   atorvastatin (LIPITOR) 40 MG tablet TAKE 1 TABLET (40 MG TOTAL) BY MOUTH DAILY AT 6 PM. 90 tablet 3   carvedilol (COREG) 25 MG tablet Take 25 mg by mouth 2 (two) times daily.     citalopram (CELEXA) 40 MG tablet Take 1 tablet (40 mg total) by mouth daily. 90 tablet 1   DULoxetine (CYMBALTA) 60 MG capsule TAKE 1 CAPSULE BY MOUTH EVERY DAY 90 capsule 3   ELIQUIS 5 MG TABS tablet TAKE 1 TABLET BY MOUTH TWICE A DAY 180 tablet 1   fluticasone-salmeterol (ADVAIR HFA) 115-21 MCG/ACT inhaler Inhale 2 puffs into the lungs 2 (two) times daily. 1 each 12   gabapentin (NEURONTIN) 300 MG capsule Take 3 capsules (900 mg total) by mouth at bedtime. 90 capsule 11   hydrALAZINE (APRESOLINE) 50 MG tablet Take 50 mg by mouth as directed. TAKE 1/2 TABLET AS NEEDED FOR BLOOD PRESSURE ABOVE 150     levothyroxine (SYNTHROID) 25 MCG tablet Take 1 tablet (25 mcg total) by mouth daily before breakfast. 90 tablet 0   pantoprazole (PROTONIX) 40 MG tablet Take 1 tablet (40 mg total) by mouth daily. 90 tablet 3   traZODone (DESYREL) 50 MG tablet Take half tablet at bedtime as needed for sleep. 90 tablet 1   amiodarone (PACERONE) 100 MG tablet Take 1 tablet (100 mg total) by mouth daily. 90 tablet 3   No current facility-administered medications for this visit.  Allergies  Allergen Reactions   Entresto [Sacubitril-Valsartan]     Abnormal kidney functions     Social History   Socioeconomic History   Marital status: Single    Spouse name: Not on file   Number of children: 1   Years of education: 8th grade   Highest education level: Not on file  Occupational History   Occupation: Disabled  Tobacco Use   Smoking status: Former    Types: Cigarettes    Quit date: 12/21/2018    Years since quitting: 1.8   Smokeless tobacco: Never  Vaping Use   Vaping Use: Never used   Substance and Sexual Activity   Alcohol use: Not Currently   Drug use: Never   Sexual activity: Not on file  Other Topics Concern   Not on file  Social History Narrative   Right-handed.   Lives at home with his daughter and son-in-law.   No daily use of caffeine.      Social Determinants of Health   Financial Resource Strain: Medium Risk   Difficulty of Paying Living Expenses: Somewhat hard  Food Insecurity: No Food Insecurity   Worried About Programme researcher, broadcasting/film/video in the Last Year: Never true   Ran Out of Food in the Last Year: Never true  Transportation Needs: No Transportation Needs   Lack of Transportation (Medical): No   Lack of Transportation (Non-Medical): No  Physical Activity: Not on file  Stress: Not on file  Social Connections: Not on file  Intimate Partner Violence: Not on file   Review of Systems: All other systems reviewed and are otherwise negative except as noted above.  Physical Exam: Vitals:   10/20/20 0845  BP: 108/62  Pulse: (!) 56  SpO2: 97%  Weight: 154 lb 3.2 oz (69.9 kg)  Height: 5\' 7"  (1.702 m)    GEN- The patient is well appearing, alert and oriented x 3 today.   HEENT: normocephalic, atraumatic; sclera clear, conjunctiva pink; hearing intact; oropharynx clear; neck supple, no JVP Lymph- no cervical lymphadenopathy Lungs- Clear to ausculation bilaterally, normal work of breathing.  No wheezes, rales, rhonchi Heart- Regular rate and rhythm, no murmurs, rubs or gallops, PMI not laterally displaced GI- soft, non-tender, non-distended, bowel sounds present, no hepatosplenomegaly Extremities- no clubbing, cyanosis, or edema; DP/PT/radial pulses 2+ bilaterally MS- no significant deformity or atrophy Skin- warm and dry, no rash or lesion Psych- euthymic mood, full affect Neuro- strength and sensation are intact  EKG is not ordered.   Additional studies reviewed include: Previous EP and gen cards note  Assessment and Plan:  1. Persistent  atrial fibrillation/ atrial flutter The patient has symptomatic, recurrent atrial fibrillation. Maintaing NSR on amiodarone the majority of the time.  We will need to follow him closely at least every 6 months on this medicine to avoid toxicity. Decrease amiodarone to 100 mg daily. Follow burden.  Dr. would not advise ablation until his hypertension improves and his cardiomyopathy is stable.  Ultimately, he may do best to stay on amiodarone long term.  As previously discussed, would only discuss ablation after his significant radicular symptoms has been addressed surgically.  Will see again in 6 months.    2. HTN Stable on dose today   3. Chronic systolic and diastolic dysfunction Echo 03/26/2020 with LVEF normalized to 60-65%.  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 Not ICD candidate with EF normalized.  4. Hypothyroid TSH elevated last month.  Noted medication changes and plans for recheck with PCP in 4-6 weeks form previous testing (2-4 weeks from today) Decreasing amiodarone as above may also help.   Graciella Freer, PA-C  10/20/20 9:01 AM

## 2020-10-20 ENCOUNTER — Ambulatory Visit (INDEPENDENT_AMBULATORY_CARE_PROVIDER_SITE_OTHER): Payer: Medicaid Other | Admitting: Student

## 2020-10-20 ENCOUNTER — Other Ambulatory Visit: Payer: Self-pay

## 2020-10-20 ENCOUNTER — Encounter: Payer: Self-pay | Admitting: Student

## 2020-10-20 VITALS — BP 108/62 | HR 56 | Ht 67.0 in | Wt 154.2 lb

## 2020-10-20 DIAGNOSIS — I10 Essential (primary) hypertension: Secondary | ICD-10-CM | POA: Diagnosis not present

## 2020-10-20 DIAGNOSIS — I4891 Unspecified atrial fibrillation: Secondary | ICD-10-CM | POA: Diagnosis not present

## 2020-10-20 DIAGNOSIS — I5042 Chronic combined systolic (congestive) and diastolic (congestive) heart failure: Secondary | ICD-10-CM | POA: Diagnosis not present

## 2020-10-20 MED ORDER — AMIODARONE HCL 100 MG PO TABS: 100.0000 mg | ORAL_TABLET | Freq: Every day | ORAL | 3 refills | Status: DC

## 2020-10-20 NOTE — Patient Instructions (Signed)
Medication Instructions:  Your physician has recommended you make the following change in your medication:   DECREASE: Amiodarone to 100mg  daily  *If you need a refill on your cardiac medications before your next appointment, please call your pharmacy*   Lab Work: None If you have labs (blood work) drawn today and your tests are completely normal, you will receive your results only by: MyChart Message (if you have MyChart) OR A paper copy in the mail If you have any lab test that is abnormal or we need to change your treatment, we will call you to review the results.   Follow-Up: At [facility], you and your health needs are our priority.  As part of our continuing mission to provide you with exceptional heart care, we have created designated Provider Care Teams.  These Care Teams include your primary Cardiologist (physician) and Advanced Practice Providers (APPs -  Physician Assistants and Nurse Practitioners) who all work together to provide you with the care you need, when you need it.   Your next appointment:   6 month(s)  The format for your next appointment:   In Person  Provider:   You may see [facility], MD or one of the following Advanced Practice Providers on your designated Care Team:   Hillis Range, Francis Dowse [place] "[facility]" [place], [place]

## 2020-10-21 ENCOUNTER — Ambulatory Visit: Payer: Medicaid Other | Attending: Neurology

## 2020-10-21 DIAGNOSIS — R293 Abnormal posture: Secondary | ICD-10-CM | POA: Insufficient documentation

## 2020-10-21 DIAGNOSIS — M25551 Pain in right hip: Secondary | ICD-10-CM | POA: Insufficient documentation

## 2020-10-21 DIAGNOSIS — R2689 Other abnormalities of gait and mobility: Secondary | ICD-10-CM

## 2020-10-21 DIAGNOSIS — G8929 Other chronic pain: Secondary | ICD-10-CM | POA: Insufficient documentation

## 2020-10-21 DIAGNOSIS — M6281 Muscle weakness (generalized): Secondary | ICD-10-CM | POA: Diagnosis present

## 2020-10-21 DIAGNOSIS — M545 Low back pain, unspecified: Secondary | ICD-10-CM | POA: Insufficient documentation

## 2020-10-21 DIAGNOSIS — M542 Cervicalgia: Secondary | ICD-10-CM | POA: Insufficient documentation

## 2020-10-21 NOTE — Therapy (Signed)
[facility] [place] [address] [place], [place], [address] Phone: [phone]   Fax:  [phone]  Physical Therapy Treatment  Patient Details  Name: Seth Snyder MRN: [id number] Date of Birth: Sep 08, 1960 Referring Provider (PT): Levert Feinstein, MD   Encounter Date: 10/21/2020   PT End of Session - 10/21/20 1411     Visit Number 6    Number of Visits 9    Authorization Type [employer] (VL: 27)    PT Start Time 1404    PT Stop Time 1442    PT Time Calculation (min) 38 min    Activity Tolerance Patient tolerated treatment well;Patient limited by pain    Behavior During Therapy [facility] for tasks assessed/performed             Past Medical History:  Diagnosis Date   Anemia    Atrial fibrillation (HCC)    Atypical chest pain 10/01/2020   Dizziness    Hypertension    Low back pain     Past Surgical History:  Procedure Laterality Date   CARDIOVERSION N/A 05/10/2019   Procedure: CARDIOVERSION;  Surgeon: Lewayne Bunting, MD;  Location[facility] ENDOSCOPY;  Service: Cardiovascular;  Laterality: N/A;   COLONOSCOPY  07/12/2013   LEFT HEART CATH AND CORONARY ANGIOGRAPHY N/A 08/30/2019   Procedure: LEFT HEART CATH AND CORONARY ANGIOGRAPHY;  Surgeon: Corky Crafts, MD;  Location: [facility] INVASIVE CV LAB;  Service: Cardiovascular;  Laterality: N/A;   TEE WITHOUT CARDIOVERSION N/A 05/10/2019   Procedure: TRANSESOPHAGEAL ECHOCARDIOGRAM (TEE);  Surgeon: Lewayne Bunting, MD;  Location[facility] ENDOSCOPY;  Service: Cardiovascular;  Laterality: N/A;    There were no vitals filed for this visit.   Subjective Assessment - 10/21/20 1405     Subjective Increased pain started after therapy and some burning. It was getting better, but has increased some again. He just came from the dentist and is having a pounding headache that he thinks is from his teeth problems.    Patient is accompained by: Interpreter   Arti   Diagnostic tests EMG nerve  conduction study showed no significant abnormality, in specific, there is no evidence of bilateral lumbosacral radiculopathy, or right cervical radiculopathy.MRI of cervical spine showed multilevel degenerative changes, most prominent lower cervical region, C5-6, C6-T1, with mild canal stenosis, no cord signal abnormality, variable  degree of foraminal narrowing. MRI lumbar spine showed multilevel degenerative changes, no evidence of spinal canal or nerve root compression    Currently in Pain? Yes    Pain Score 8     Pain Location Back    Pain Orientation Right;Lower    Pain Descriptors / Indicators Burning                               [facility] Adult PT Treatment/Exercise - 10/21/20 0001       Ambulation/Gait   Ambulation/Gait Yes    Ambulation/Gait Assistance 6: Modified independent (Device/Increase time)    Ambulation Distance (Feet) 300 Feet    Assistive device Rollator    Gait Pattern Step-through pattern    Ambulation Surface Level;Indoor    Gait Comments as warm up   machine causes pain     Self-Care   Self-Care Other Self-Care Comments    Other Self-Care Comments  see pt edu      Lumbar Exercises: Stretches   Passive Hamstring Stretch Right;5 reps;10 seconds;Left;3 reps    Single Knee to  Chest Stretch Right;4 reps;10 seconds;Left;2 reps    Lower Trunk Rotation Limitations slow rotations x 10 each    Piriformis Stretch Right;30 seconds    Figure 4 Stretch 30 seconds    Figure 4 Stretch Limitations Right      Lumbar Exercises: Standing   Other Standing Lumbar Exercises Blue stablity ball press up wall    Other Standing Lumbar Exercises Blue Stability ball mini squat      Lumbar Exercises: Seated   Other Seated Lumbar Exercises Child's pose fwd x 10 and lateral x 5 ea with blue stability ball   some pain with R lateral flexion on R side, alleviated with L lateral flexion     Lumbar Exercises: Supine   Heel Slides 15 reps    Heel Slides Limitations with  feet on Blue stability ball   for low back stretch and hamstring firing                   PT Education - 10/21/20 1444     Education Details edu on letting therapist know Thursday how he is feeling, and if not improving consider returning to PCP sooner    Person(s) Educated Patient    Methods Explanation;Tactile cues;Verbal cues    Comprehension Verbalized understanding;Verbal cues required;Tactile cues required              PT Short Term Goals - 10/02/20 1717       PT SHORT TERM GOAL #1   Title Patient will be independent with initial HEP for strength/stretching/ROM (ALL STGs: at 4th Visit)    Baseline no HEP established    Time 4    Period Weeks    Status On-going      PT SHORT TERM GOAL #2   Title to be assessed and LTG to be set as applicable    Baseline TBA    Time 4    Period Weeks    Status Achieved               PT Long Term Goals - 10/02/20 1717       PT LONG TERM GOAL #1   Title Patient will be independent with final HEP for strength/balance/stretching and daily walking program (ALL LTGs Due: at 9th visit)    Baseline no HEP established    Time 8    Period Weeks    Status New      PT LONG TERM GOAL #2   Title Pt will be able to tolerate ambulating at least 595' during 6 MWT with rollator to demo MDIC and improved community mobility    Baseline 395' with rollator    Time 8    Period Weeks    Status New    Target Date 11/27/20      PT LONG TERM GOAL #3   Title Pt will have improved Oswestry Low Back Disability score by at least 11 points to demonstrate MDC    Baseline 37/50    Time 8    Period Weeks    Status New    Target Date 11/27/20      PT LONG TERM GOAL #4   Title Patient will improve cervical ROM by 8 degrees in all direction to demonstrate improved ability to scan environment    Baseline see flowsheet    Time 8    Period Weeks    Status New      PT LONG TERM GOAL #5   Title Patient will be  able to ambulate x 100  ft with LRAD vs. No AD and Mod I for improved household mobility    Baseline 50 ft [facility] Supervision    Time 8    Period Weeks    Status New                   Plan - 10/21/20 1412     Clinical Impression Statement Pt presents with increasing burning in R lower back that was previously improving after trialing seated core work last session. Focused on stretching today with blue stability ball, as pt has a ball at home he uses. Educated pt to let his therapist know at next visit on Thursday how he is feeling, and if not improving or getting worse to consider contacting PCP to return sooner.    Personal Factors and Comorbidities Comorbidity 3+;Time since onset of injury/illness/exacerbation    Comorbidities HTN, A-Fib, Anemia, LBP    Examination-Activity Limitations Bed Mobility;Transfers;Stairs;Stand;Locomotion Level;Squat    Examination-Participation Restrictions Community Activity;Yard Work;Occupation    Stability/Clinical Decision Making Stable/Uncomplicated    Rehab Potential Good    PT Frequency 1x / week    PT Duration 8 weeks    PT Treatment/Interventions ADLs/Self Care Home Management;Aquatic Therapy;Cryotherapy;Electrical Stimulation;Iontophoresis 4mg /ml Dexamethasone;Moist Heat;DME Instruction;Stair training;Functional mobility training;Therapeutic activities;Gait training;Therapeutic exercise;Balance training;Neuromuscular re-education;Patient/family education;Orthotic Fit/Training;Manual techniques;Passive range of motion;Dry needling;Spinal Manipulations;Joint Manipulations    PT Next Visit Plan Initiate HEP focused on stretching for cervical/low back. Work on core strengthening as pt is able to tolerate. Work on pelvic/low back AROM.    PT Home Exercise Plan Access Code    Consulted and Agree with Plan of Care Patient             Patient will benefit from skilled therapeutic intervention in order to improve the following deficits and impairments:  Abnormal  gait, Decreased balance, Difficulty walking, Increased muscle spasms, Impaired sensation, Pain, Postural dysfunction, Impaired flexibility, Decreased strength, Decreased knowledge of use of DME, Decreased activity tolerance, Decreased coordination, Decreased range of motion, Decreased endurance  Visit Diagnosis: Other abnormalities of gait and mobility  Muscle weakness (generalized)  Cervicalgia  Pain in right hip  Chronic right-sided low back pain, unspecified whether sciatica present  Abnormal posture     Problem List Patient Active Problem List   Diagnosis Date Noted   Atypical chest pain 10/01/2020   Chronic kidney disease 09/18/2020   MDD (major depressive disorder), recurrent, in partial remission (HCC) 09/11/2020   Gait abnormality 07/29/2020   Neck pain 07/29/2020   Chronic right-sided low back pain with right-sided sciatica 07/29/2020   Urinary incontinence 07/29/2020   Right hip pain 07/29/2020   MDD (major depressive disorder), recurrent episode, moderate (HCC) 04/15/2020   Persistent atrial fibrillation (HCC) 09/20/2019   Secondary hypercoagulable state (HCC) 09/20/2019   Abnormal nuclear stress test    Spinal stenosis of cervical region 08/13/2019   Protrusion of cervical intervertebral disc 08/13/2019   Shoulder pain    Chronic combined systolic and diastolic heart failure (HCC)    Atrial fibrillation with RVR (HCC) 05/07/2019   Hypertension 05/07/2019   Hypothyroidism 05/07/2019   Hypomagnesemia 05/07/2019   Elevated troponin 05/07/2019    05/09/2019, PT, DPT 10/21/2020, 2:47 PM  [facility] [place] [address] [place], [place], [place] Phone: [phone]   Fax:  [phone]  Name: Bradyn Tyress Loden MRN: Lucious [place]ves Date of Birth: [birth year]

## 2020-10-23 ENCOUNTER — Ambulatory Visit: Payer: Medicaid Other | Admitting: Physical Therapy

## 2020-10-23 ENCOUNTER — Other Ambulatory Visit: Payer: Self-pay

## 2020-10-23 ENCOUNTER — Ambulatory Visit: Payer: Medicaid Other

## 2020-10-23 ENCOUNTER — Encounter: Payer: Self-pay | Admitting: Physical Therapy

## 2020-10-23 VITALS — BP 117/77

## 2020-10-23 DIAGNOSIS — M6281 Muscle weakness (generalized): Secondary | ICD-10-CM

## 2020-10-23 DIAGNOSIS — M25551 Pain in right hip: Secondary | ICD-10-CM

## 2020-10-23 DIAGNOSIS — G8929 Other chronic pain: Secondary | ICD-10-CM

## 2020-10-23 DIAGNOSIS — R293 Abnormal posture: Secondary | ICD-10-CM

## 2020-10-23 DIAGNOSIS — M545 Low back pain, unspecified: Secondary | ICD-10-CM

## 2020-10-23 DIAGNOSIS — M542 Cervicalgia: Secondary | ICD-10-CM

## 2020-10-23 DIAGNOSIS — R2689 Other abnormalities of gait and mobility: Secondary | ICD-10-CM | POA: Diagnosis not present

## 2020-10-23 NOTE — Therapy (Signed)
[facility] [address] [place], [place], [address] Phone: [phone]   Fax:  [phone]  Physical Therapy Treatment  Patient Details  Name: Seth Snyder MRN: [id number] Date of Birth: 1960/08/14 Referring Provider (PT): Levert Feinstein, MD   Encounter Date: 10/23/2020   PT End of Session - 10/23/20 1446     Visit Number 7    Number of Visits 9    Authorization Type [employer] (VL: 27)    PT Start Time 1402    PT Stop Time 1455   moist heat   PT Time Calculation (min) 53 min    Activity Tolerance Patient tolerated treatment well;Patient limited by pain    Behavior During Therapy [facility] for tasks assessed/performed             Past Medical History:  Diagnosis Date   Anemia    Atrial fibrillation (HCC)    Atypical chest pain 10/01/2020   Dizziness    Hypertension    Low back pain     Past Surgical History:  Procedure Laterality Date   CARDIOVERSION N/A 05/10/2019   Procedure: CARDIOVERSION;  Surgeon: Lewayne Bunting, MD;  Location: [facility] ENDOSCOPY;  Service: Cardiovascular;  Laterality: N/A;   COLONOSCOPY  07/12/2013   LEFT HEART CATH AND CORONARY ANGIOGRAPHY N/A 08/30/2019   Procedure: LEFT HEART CATH AND CORONARY ANGIOGRAPHY;  Surgeon: Corky Crafts, MD;  Location: [facility] INVASIVE CV LAB;  Service: Cardiovascular;  Laterality: N/A;   TEE WITHOUT CARDIOVERSION N/A 05/10/2019   Procedure: TRANSESOPHAGEAL ECHOCARDIOGRAM (TEE);  Surgeon: Lewayne Bunting, MD;  Location: [facility] ENDOSCOPY;  Service: Cardiovascular;  Laterality: N/A;    Vitals:   10/23/20 1408  BP: 117/77     Subjective Assessment - 10/23/20 1402     Subjective Pain is the same; has not gotten better since starting therapy. Had one of his teeth removed on Monday and since then he has had a HA and some dizziness. Notes most pain when laying supine, better in sitting.    Patient is accompained by: Interpreter    Pertinent History HTN, A-Fib,  Anemia, LBP    Diagnostic tests EMG nerve conduction study showed no significant abnormality, in specific, there is no evidence of bilateral lumbosacral radiculopathy, or right cervical radiculopathy.MRI of cervical spine showed multilevel degenerative changes, most prominent lower cervical region, C5-6, C6-T1, with mild canal stenosis, no cord signal abnormality, variable  degree of foraminal narrowing. MRI lumbar spine showed multilevel degenerative changes, no evidence of spinal canal or nerve root compression    Patient Stated Goals improve pain    Currently in Pain? Yes    Pain Score 8     Pain Location Back    Pain Orientation Right;Lower    Pain Descriptors / Indicators Burning    Pain Type Chronic pain    Pain Radiating Towards into B feet    Multiple Pain Sites Yes    Pain Score 8    Pain Location Neck    Pain Orientation Right    Pain Descriptors / Indicators Burning    Pain Type Chronic pain                               [facility] Adult PT Treatment/Exercise - 10/23/20 0001       Lumbar Exercises: Stretches   Hip Flexor Stretch Right;3 reps;30 seconds    Hip Flexor Stretch Limitations at wall  Other Lumbar Stretch Exercise prayer stretch with pball 10x3" to tolerance      Lumbar Exercises: Aerobic   Recumbent Bike L1 x 6 min   very slow speed     Lumbar Exercises: Seated   Other Seated Lumbar Exercises L/R HS curl with green TB L LE, red TB R LE 10x each; L/R LE LAQ with yellow TB 10x    Other Seated Lumbar Exercises sitting isometrics with pball 10x5"   cues for rhythmic breathing pattern     Modalities   Modalities Moist Heat      Moist Heat Therapy   Number Minutes Moist Heat 10 Minutes    Moist Heat Location Lumbar Spine                    PT Education - 10/23/20 1414     Education Details discussion with patient on limited benefit from PT and encouraged him to speak with his MDs about next steps; update to HEP    Person(s)  Educated Patient    Methods Explanation    Comprehension Verbalized understanding              PT Short Term Goals - 10/02/20 1717       PT SHORT TERM GOAL #1   Title Patient will be independent with initial HEP for strength/stretching/ROM (ALL STGs: at 4th Visit)    Baseline no HEP established    Time 4    Period Weeks    Status On-going      PT SHORT TERM GOAL #2   Title to be assessed and LTG to be set as applicable    Baseline TBA    Time 4    Period Weeks    Status Achieved               PT Long Term Goals - 10/23/20 1451       PT LONG TERM GOAL #1   Title Patient will be independent with final HEP for strength/balance/stretching and daily walking program (ALL LTGs Due: at 9th visit)    Baseline no HEP established    Time 8    Period Weeks    Status On-going      PT LONG TERM GOAL #2   Title Pt will be able to tolerate ambulating at least 595' during 6 MWT with rollator to demo MDIC and improved community mobility    Baseline 395' with rollator    Time 8    Period Weeks    Status On-going      PT LONG TERM GOAL #3   Title Pt will have improved Oswestry Low Back Disability score by at least 11 points to demonstrate MDC    Baseline 37/50    Time 8    Period Weeks    Status On-going      PT LONG TERM GOAL #4   Title Patient will improve cervical ROM by 8 degrees in all direction to demonstrate improved ability to scan environment    Baseline see flowsheet    Time 8    Period Weeks    Status On-going      PT LONG TERM GOAL #5   Title Patient will be able to ambulate x 100 ft with LRAD vs. No AD and Mod I for improved household mobility    Baseline 50 ft [facility] Supervision    Time 8    Period Weeks    Status On-going  Plan - 10/23/20 1450     Clinical Impression Statement Patient arrived to session with report of unchanged pain in R LB and neck. Notes HA and mild dizziness since a tooth removal on Monday. BP was  normal; slight bradycardia at rest but [facility]. Encouraged patient to speak to his MDs about limited benefit from PT at next F/U. Patient performed very gentle lumbopelvic mobility work within a tolerable ROM. Patient with report of "tightness" in his back and pain in the R groin. Proceeded with gentle R hip flexor stretch which was fairly well-tolerated when performing within a limited ROM. Ended session with seated LE strengthening as this is most well-tolerated position for patient, with decreased resistance and increased difficulty on R LE d/t weakness. Patient noted increased R anterior hip pin at end of session which was addressed with moist heat. No  further complaints at end of session.    Personal Factors and Comorbidities Comorbidity 3+;Time since onset of injury/illness/exacerbation    Comorbidities HTN, A-Fib, Anemia, LBP    Examination-Activity Limitations Bed Mobility;Transfers;Stairs;Stand;Locomotion Level;Squat    Examination-Participation Restrictions Community Activity;Yard Work;Occupation    Stability/Clinical Decision Making Stable/Uncomplicated    Rehab Potential Good    PT Frequency 1x / week    PT Duration 8 weeks    PT Treatment/Interventions ADLs/Self Care Home Management;Aquatic Therapy;Cryotherapy;Electrical Stimulation;Iontophoresis 4mg /ml Dexamethasone;Moist Heat;DME Instruction;Stair training;Functional mobility training;Therapeutic activities;Gait training;Therapeutic exercise;Balance training;Neuromuscular re-education;Patient/family education;Orthotic Fit/Training;Manual techniques;Passive range of motion;Dry needling;Spinal Manipulations;Joint Manipulations    PT Next Visit Plan Initiate HEP focused on stretching for cervical/low back. Work on core strengthening as pt is able to tolerate. Work on pelvic/low back AROM.    PT Home Exercise Plan Access Code    Consulted and Agree with Plan of Care Patient             Patient will benefit from skilled  therapeutic intervention in order to improve the following deficits and impairments:  Abnormal gait, Decreased balance, Difficulty walking, Increased muscle spasms, Impaired sensation, Pain, Postural dysfunction, Impaired flexibility, Decreased strength, Decreased knowledge of use of DME, Decreased activity tolerance, Decreased coordination, Decreased range of motion, Decreased endurance  Visit Diagnosis: Other abnormalities of gait and mobility  Muscle weakness (generalized)  Cervicalgia  Pain in right hip  Chronic right-sided low back pain, unspecified whether sciatica present  Abnormal posture     Problem List Patient Active Problem List   Diagnosis Date Noted   Atypical chest pain 10/01/2020   Chronic kidney disease 09/18/2020   MDD (major depressive disorder), recurrent, in partial remission (HCC) 09/11/2020   Gait abnormality 07/29/2020   Neck pain 07/29/2020   Chronic right-sided low back pain with right-sided sciatica 07/29/2020   Urinary incontinence 07/29/2020   Right hip pain 07/29/2020   MDD (major depressive disorder), recurrent episode, moderate (HCC) 04/15/2020   Persistent atrial fibrillation (HCC) 09/20/2019   Secondary hypercoagulable state (HCC) 09/20/2019   Abnormal nuclear stress test    Spinal stenosis of cervical region 08/13/2019   Protrusion of cervical intervertebral disc 08/13/2019   Shoulder pain    Chronic combined systolic and diastolic heart failure (HCC)    Atrial fibrillation with RVR (HCC) 05/07/2019   Hypertension 05/07/2019   Hypothyroidism 05/07/2019   Hypomagnesemia 05/07/2019   Elevated troponin 05/07/2019     05/09/2019, PT, DPT 10/23/20 2:57 PM   [facility] [place] [facility] [address] [place], [place], [place] Phone: [phone]   Fax:  [phone]  Name: Mazin Honorio Devol MRN:  [id number] Date of Birth: 01-26-1961

## 2020-10-28 ENCOUNTER — Encounter: Payer: Self-pay | Admitting: Physical Therapy

## 2020-10-28 ENCOUNTER — Other Ambulatory Visit: Payer: Self-pay

## 2020-10-28 ENCOUNTER — Ambulatory Visit: Payer: Medicaid Other | Admitting: Physical Therapy

## 2020-10-28 DIAGNOSIS — R2689 Other abnormalities of gait and mobility: Secondary | ICD-10-CM | POA: Diagnosis not present

## 2020-10-28 DIAGNOSIS — M25551 Pain in right hip: Secondary | ICD-10-CM

## 2020-10-28 DIAGNOSIS — M6281 Muscle weakness (generalized): Secondary | ICD-10-CM

## 2020-10-28 NOTE — Therapy (Signed)
[facility] [address] [place], [place], [address] Phone: [phone]   Fax:  [phone]  Physical Therapy Treatment  Patient Details  Name: Seth Snyder MRN: [id number] Date of Birth: 04-Jul-1960 Referring Provider (PT): Levert Feinstein, MD   Encounter Date: 10/28/2020   PT End of Session - 10/28/20 1509     Visit Number 8    Authorization Type [employer] (VL: 27)    PT Start Time 1430    PT Stop Time 1510    PT Time Calculation (min) 40 min    Activity Tolerance Patient tolerated treatment well;Patient limited by pain    Behavior During Therapy [facility] for tasks assessed/performed             Past Medical History:  Diagnosis Date   Anemia    Atrial fibrillation (HCC)    Atypical chest pain 10/01/2020   Dizziness    Hypertension    Low back pain     Past Surgical History:  Procedure Laterality Date   CARDIOVERSION N/A 05/10/2019   Procedure: CARDIOVERSION;  Surgeon: Lewayne Bunting, MD;  Location: [facility] ENDOSCOPY;  Service: Cardiovascular;  Laterality: N/A;   COLONOSCOPY  07/12/2013   LEFT HEART CATH AND CORONARY ANGIOGRAPHY N/A 08/30/2019   Procedure: LEFT HEART CATH AND CORONARY ANGIOGRAPHY;  Surgeon: Corky Crafts, MD;  Location: [facility] INVASIVE CV LAB;  Service: Cardiovascular;  Laterality: N/A;   TEE WITHOUT CARDIOVERSION N/A 05/10/2019   Procedure: TRANSESOPHAGEAL ECHOCARDIOGRAM (TEE);  Surgeon: Lewayne Bunting, MD;  Location: [facility] ENDOSCOPY;  Service: Cardiovascular;  Laterality: N/A;    There were no vitals filed for this visit.   Subjective Assessment - 10/28/20 1430     Subjective Pt reports no change    Currently in Pain? Yes    Pain Score 8     Pain Location Hip    Pain Orientation Right                               [facility] Adult PT Treatment/Exercise - 10/28/20 0001       Ambulation/Gait   Ambulation/Gait Yes    Ambulation/Gait Assistance 6: Modified independent  (Device/Increase time)    Ambulation Distance (Feet) 300 Feet    Assistive device Rollator    Gait Pattern Step-through pattern    Ambulation Surface Level;Indoor      Lumbar Exercises: Stretches   Passive Hamstring Stretch Right;10 seconds;Left;4 reps    Double Knee to Chest Stretch 4 reps;10 seconds    Hip Flexor Stretch Right;Left;3 reps;10 seconds;20 seconds    Piriformis Stretch Right;2 reps;10 seconds    Other Lumbar Stretch Exercise Thomas stretch RLE 5x 10''      Lumbar Exercises: Aerobic   Nustep L3 x 3 min      Lumbar Exercises: Machines for Strengthening   Cybex Knee Extension 5lb 2x10    Cybex Knee Flexion 20lb 2x10      Lumbar Exercises: Standing   Other Standing Lumbar Exercises Hip Abd and Ext x10 each                      PT Short Term Goals - 10/02/20 1717       PT SHORT TERM GOAL #1   Title Patient will be independent with initial HEP for strength/stretching/ROM (ALL STGs: at 4th Visit)    Baseline no HEP established    Time 4  Period Weeks    Status On-going      PT SHORT TERM GOAL #2   Title to be assessed and LTG to be set as applicable    Baseline TBA    Time 4    Period Weeks    Status Achieved               PT Long Term Goals - 10/23/20 1451       PT LONG TERM GOAL #1   Title Patient will be independent with final HEP for strength/balance/stretching and daily walking program (ALL LTGs Due: at 9th visit)    Baseline no HEP established    Time 8    Period Weeks    Status On-going      PT LONG TERM GOAL #2   Title Pt will be able to tolerate ambulating at least 595' during 6 MWT with rollator to demo MDIC and improved community mobility    Baseline 395' with rollator    Time 8    Period Weeks    Status On-going      PT LONG TERM GOAL #3   Title Pt will have improved Oswestry Low Back Disability score by at least 11 points to demonstrate MDC    Baseline 37/50    Time 8    Period Weeks    Status On-going       PT LONG TERM GOAL #4   Title Patient will improve cervical ROM by 8 degrees in all direction to demonstrate improved ability to scan environment    Baseline see flowsheet    Time 8    Period Weeks    Status On-going      PT LONG TERM GOAL #5   Title Patient will be able to ambulate x 100 ft with LRAD vs. No AD and Mod I for improved household mobility    Baseline 50 ft [facility] Supervision    Time 8    Period Weeks    Status On-going                   Plan - 10/28/20 1513     Clinical Impression Statement Pt enters clinic reporting increase R hip and low back pain. Pt report a little improvement in  pain after LE and lumbar stretching. He was able to complete a machine level quad and hamstring strengthening as well a standing hip strengthening interventions. Pt wants to try traction next session.    Personal Factors and Comorbidities Comorbidity 3+;Time since onset of injury/illness/exacerbation    Comorbidities HTN, A-Fib, Anemia, LBP    Examination-Activity Limitations Bed Mobility;Transfers;Stairs;Stand;Locomotion Level;Squat    Examination-Participation Restrictions Community Activity;Yard Work;Occupation    Stability/Clinical Decision Making Stable/Uncomplicated    Rehab Potential Good    PT Frequency 1x / week    PT Duration 8 weeks    PT Treatment/Interventions ADLs/Self Care Home Management;Aquatic Therapy;Cryotherapy;Electrical Stimulation;Iontophoresis 4mg /ml Dexamethasone;Moist Heat;DME Instruction;Stair training;Functional mobility training;Therapeutic activities;Gait training;Therapeutic exercise;Balance training;Neuromuscular re-education;Patient/family education;Orthotic Fit/Training;Manual techniques;Passive range of motion;Dry needling;Spinal Manipulations;Joint Manipulations    PT Next Visit Plan Pt wants to try traction. focused on stretching for cervical/low back. Work on core strengthening as pt is able to tolerate. Work on pelvic/low back AROM.              Patient will benefit from skilled therapeutic intervention in order to improve the following deficits and impairments:  Abnormal gait, Decreased balance, Difficulty walking, Increased muscle spasms, Impaired sensation, Pain, Postural dysfunction, Impaired flexibility, Decreased  strength, Decreased knowledge of use of DME, Decreased activity tolerance, Decreased coordination, Decreased range of motion, Decreased endurance  Visit Diagnosis: Other abnormalities of gait and mobility  Muscle weakness (generalized)  Pain in right hip     Problem List Patient Active Problem List   Diagnosis Date Noted   Atypical chest pain 10/01/2020   Chronic kidney disease 09/18/2020   MDD (major depressive disorder), recurrent, in partial remission (HCC) 09/11/2020   Gait abnormality 07/29/2020   Neck pain 07/29/2020   Chronic right-sided low back pain with right-sided sciatica 07/29/2020   Urinary incontinence 07/29/2020   Right hip pain 07/29/2020   MDD (major depressive disorder), recurrent episode, moderate (HCC) 04/15/2020   Persistent atrial fibrillation (HCC) 09/20/2019   Secondary hypercoagulable state (HCC) 09/20/2019   Abnormal nuclear stress test    Spinal stenosis of cervical region 08/13/2019   Protrusion of cervical intervertebral disc 08/13/2019   Shoulder pain    Chronic combined systolic and diastolic heart failure (HCC)    Atrial fibrillation with RVR (HCC) 05/07/2019   Hypertension 05/07/2019   Hypothyroidism 05/07/2019   Hypomagnesemia 05/07/2019   Elevated troponin 05/07/2019    Grayce Sessions 10/28/2020, 3:18 PM  [facility] [address] [place], [place], [address] Phone: [phone]   Fax:  [phone]  Name: Seth Snyder MRN: [id number] Date of Birth: 18-Jul-1960

## 2020-10-30 ENCOUNTER — Other Ambulatory Visit: Payer: Self-pay

## 2020-10-30 ENCOUNTER — Encounter: Payer: Self-pay | Admitting: Physical Therapy

## 2020-10-30 ENCOUNTER — Ambulatory Visit: Payer: Medicaid Other | Admitting: Physical Therapy

## 2020-10-30 ENCOUNTER — Ambulatory Visit: Payer: Medicaid Other

## 2020-10-30 DIAGNOSIS — M6281 Muscle weakness (generalized): Secondary | ICD-10-CM

## 2020-10-30 DIAGNOSIS — M25551 Pain in right hip: Secondary | ICD-10-CM

## 2020-10-30 DIAGNOSIS — M542 Cervicalgia: Secondary | ICD-10-CM

## 2020-10-30 DIAGNOSIS — R293 Abnormal posture: Secondary | ICD-10-CM

## 2020-10-30 DIAGNOSIS — R2689 Other abnormalities of gait and mobility: Secondary | ICD-10-CM | POA: Diagnosis not present

## 2020-10-30 NOTE — Therapy (Signed)
[facility] [address] [place], [place], [address] Phone: [phone]   Fax:  [phone]  Physical Therapy Treatment  Patient Details  Name: Seth Snyder MRN: [id number] Date of Birth: 06-Mar-1961 Referring Provider (PT): Levert Feinstein, MD   Encounter Date: 10/30/2020   PT End of Session - 10/30/20 1512     Visit Number 9    Authorization Type [employer] (VL: 27)    PT Start Time 1435    PT Stop Time 1515    PT Time Calculation (min) 40 min             Past Medical History:  Diagnosis Date   Anemia    Atrial fibrillation (HCC)    Atypical chest pain 10/01/2020   Dizziness    Hypertension    Low back pain     Past Surgical History:  Procedure Laterality Date   CARDIOVERSION N/A 05/10/2019   Procedure: CARDIOVERSION;  Surgeon: Lewayne Bunting, MD;  Location: [facility] ENDOSCOPY;  Service: Cardiovascular;  Laterality: N/A;   COLONOSCOPY  07/12/2013   LEFT HEART CATH AND CORONARY ANGIOGRAPHY N/A 08/30/2019   Procedure: LEFT HEART CATH AND CORONARY ANGIOGRAPHY;  Surgeon: Corky Crafts, MD;  Location: [facility] INVASIVE CV LAB;  Service: Cardiovascular;  Laterality: N/A;   TEE WITHOUT CARDIOVERSION N/A 05/10/2019   Procedure: TRANSESOPHAGEAL ECHOCARDIOGRAM (TEE);  Surgeon: Lewayne Bunting, MD;  Location: [facility] ENDOSCOPY;  Service: Cardiovascular;  Laterality: N/A;    There were no vitals filed for this visit.   Subjective Assessment - 10/30/20 1438     Subjective Pain on the R side, shoulder, back, and anterior R hip    Currently in Pain? Yes    Pain Score 8     Pain Location Back    Pain Orientation Right                               [facility] Adult PT Treatment/Exercise - 10/30/20 0001       Ambulation/Gait   Ambulation/Gait Yes    Ambulation/Gait Assistance 5: Supervision    Ambulation Distance (Feet) 100 Feet    Assistive device None    Gait Pattern Step-through pattern     Ambulation Surface Level;Unlevel      Lumbar Exercises: Aerobic   Nustep L3 x 5 min      Lumbar Exercises: Machines for Strengthening   Cybex Knee Flexion 20lb 2x10      Lumbar Exercises: Standing   Row Theraband;20 reps;Both;Strengthening    Theraband Level (Row) Level 4 (Blue)    Shoulder Extension Strengthening;Both;Theraband;20 reps    Theraband Level (Shoulder Extension) Level 4 (Blue)      Lumbar Exercises: Seated   Sit to Stand 10 reps   x2   Other Seated Lumbar Exercises seated Trunk rotatin w/ ball 2x10    Other Seated Lumbar Exercises OHP yellow ball 2x5                      PT Short Term Goals - 10/02/20 1717       PT SHORT TERM GOAL #1   Title Patient will be independent with initial HEP for strength/stretching/ROM (ALL STGs: at 4th Visit)    Baseline no HEP established    Time 4    Period Weeks    Status On-going      PT SHORT TERM GOAL #2   Title  to be assessed and LTG to be set as applicable    Baseline TBA    Time 4    Period Weeks    Status Achieved               PT Long Term Goals - 10/23/20 1451       PT LONG TERM GOAL #1   Title Patient will be independent with final HEP for strength/balance/stretching and daily walking program (ALL LTGs Due: at 9th visit)    Baseline no HEP established    Time 8    Period Weeks    Status On-going      PT LONG TERM GOAL #2   Title Pt will be able to tolerate ambulating at least 595' during 6 MWT with rollator to demo MDIC and improved community mobility    Baseline 395' with rollator    Time 8    Period Weeks    Status On-going      PT LONG TERM GOAL #3   Title Pt will have improved Oswestry Low Back Disability score by at least 11 points to demonstrate MDC    Baseline 37/50    Time 8    Period Weeks    Status On-going      PT LONG TERM GOAL #4   Title Patient will improve cervical ROM by 8 degrees in all direction to demonstrate improved ability to scan environment     Baseline see flowsheet    Time 8    Period Weeks    Status On-going      PT LONG TERM GOAL #5   Title Patient will be able to ambulate x 100 ft with LRAD vs. No AD and Mod I for improved household mobility    Baseline 50 ft [facility] Supervision    Time 8    Period Weeks    Status On-going                   Plan - 10/30/20 1513     Clinical Impression Statement Pt~ 5 minutes late for today's session. He did really well progressing to a more active treatment session. Pt able to perform S2S I without UE assist. Progressed ambulation without AD. Tactile cue to prevent posterior leaning with standing shoulder rows.    Personal Factors and Comorbidities Comorbidity 3+;Time since onset of injury/illness/exacerbation    Comorbidities HTN, A-Fib, Anemia, LBP    Examination-Activity Limitations Bed Mobility;Transfers;Stairs;Stand;Locomotion Level;Squat    Examination-Participation Restrictions Community Activity;Yard Work;Occupation    Stability/Clinical Decision Making Stable/Uncomplicated    Rehab Potential Good    PT Frequency 1x / week    PT Duration 8 weeks    PT Treatment/Interventions ADLs/Self Care Home Management;Aquatic Therapy;Cryotherapy;Electrical Stimulation;Iontophoresis 4mg /ml Dexamethasone;Moist Heat;DME Instruction;Stair training;Functional mobility training;Therapeutic activities;Gait training;Therapeutic exercise;Balance training;Neuromuscular re-education;Patient/family education;Orthotic Fit/Training;Manual techniques;Passive range of motion;Dry needling;Spinal Manipulations;Joint Manipulations    PT Next Visit Plan Pt wants to try traction. focused on stretching for cervical/low back. Work on core strengthening as pt is able to tolerate. Work on pelvic/low back AROM.             Patient will benefit from skilled therapeutic intervention in order to improve the following deficits and impairments:  Abnormal gait, Decreased balance, Difficulty walking, Increased  muscle spasms, Impaired sensation, Pain, Postural dysfunction, Impaired flexibility, Decreased strength, Decreased knowledge of use of DME, Decreased activity tolerance, Decreased coordination, Decreased range of motion, Decreased endurance  Visit Diagnosis: Pain in right hip  Cervicalgia  Muscle weakness (  generalized)  Abnormal posture  Other abnormalities of gait and mobility     Problem List Patient Active Problem List   Diagnosis Date Noted   Atypical chest pain 10/01/2020   Chronic kidney disease 09/18/2020   MDD (major depressive disorder), recurrent, in partial remission (HCC) 09/11/2020   Gait abnormality 07/29/2020   Neck pain 07/29/2020   Chronic right-sided low back pain with right-sided sciatica 07/29/2020   Urinary incontinence 07/29/2020   Right hip pain 07/29/2020   MDD (major depressive disorder), recurrent episode, moderate (HCC) 04/15/2020   Persistent atrial fibrillation (HCC) 09/20/2019   Secondary hypercoagulable state (HCC) 09/20/2019   Abnormal nuclear stress test    Spinal stenosis of cervical region 08/13/2019   Protrusion of cervical intervertebral disc 08/13/2019   Shoulder pain    Chronic combined systolic and diastolic heart failure (HCC)    Atrial fibrillation with RVR (HCC) 05/07/2019   Hypertension 05/07/2019   Hypothyroidism 05/07/2019   Hypomagnesemia 05/07/2019   Elevated troponin 05/07/2019    Grayce Sessions 10/30/2020, 3:15 PM  [facility] [address] [place], [place], [address] Phone: [phone]   Fax:  [phone]  Name: Jakobee Epimenio Schetter MRN: [id number] Date of Birth: 10-14-1960

## 2020-11-04 ENCOUNTER — Other Ambulatory Visit: Payer: Self-pay

## 2020-11-04 ENCOUNTER — Ambulatory Visit: Payer: Medicaid Other | Admitting: Physical Therapy

## 2020-11-04 ENCOUNTER — Encounter: Payer: Self-pay | Admitting: Physical Therapy

## 2020-11-04 DIAGNOSIS — M6281 Muscle weakness (generalized): Secondary | ICD-10-CM

## 2020-11-04 DIAGNOSIS — M25551 Pain in right hip: Secondary | ICD-10-CM

## 2020-11-04 DIAGNOSIS — M542 Cervicalgia: Secondary | ICD-10-CM

## 2020-11-04 DIAGNOSIS — R2689 Other abnormalities of gait and mobility: Secondary | ICD-10-CM | POA: Diagnosis not present

## 2020-11-04 NOTE — Therapy (Signed)
[facility] [address] [place], [place], [address] Phone: [phone]   Fax:  [phone]  Physical Therapy Treatment  Patient Details  Name: Seth Snyder MRN: [id number] Date of Birth: March 25, 1960 Referring Provider (PT): Levert Feinstein, MD   Encounter Date: 11/04/2020   PT End of Session - 11/04/20 1509     Visit Number 10    Authorization Type [employer] (VL: 27)    PT Start Time 1430    PT Stop Time 1511    PT Time Calculation (min) 41 min    Activity Tolerance Patient limited by pain;Patient limited by fatigue;Patient tolerated treatment well    Behavior During Therapy [facility] for tasks assessed/performed             Past Medical History:  Diagnosis Date   Anemia    Atrial fibrillation (HCC)    Atypical chest pain 10/01/2020   Dizziness    Hypertension    Low back pain     Past Surgical History:  Procedure Laterality Date   CARDIOVERSION N/A 05/10/2019   Procedure: CARDIOVERSION;  Surgeon: Lewayne Bunting, MD;  Location: [facility] ENDOSCOPY;  Service: Cardiovascular;  Laterality: N/A;   COLONOSCOPY  07/12/2013   LEFT HEART CATH AND CORONARY ANGIOGRAPHY N/A 08/30/2019   Procedure: LEFT HEART CATH AND CORONARY ANGIOGRAPHY;  Surgeon: Corky Crafts, MD;  Location: [facility] INVASIVE CV LAB;  Service: Cardiovascular;  Laterality: N/A;   TEE WITHOUT CARDIOVERSION N/A 05/10/2019   Procedure: TRANSESOPHAGEAL ECHOCARDIOGRAM (TEE);  Surgeon: Lewayne Bunting, MD;  Location: [facility] ENDOSCOPY;  Service: Cardiovascular;  Laterality: N/A;    There were no vitals filed for this visit.   Subjective Assessment - 11/04/20 1431     Subjective "Not good" Pain in the R side remains    Currently in Pain? Yes    Pain Score 7     Pain Location --   R shoulder, hip, and back                              [facility] Adult PT Treatment/Exercise - 11/04/20 0001       Lumbar Exercises: Stretches   Passive Hamstring  Stretch Right;10 seconds;Left;4 reps    Lower Trunk Rotation 3 reps;10 seconds    Hip Flexor Stretch Right;5 reps;20 seconds      Lumbar Exercises: Aerobic   Nustep L3 x 5 min      Lumbar Exercises: Standing   Row Theraband;20 reps;Both;Strengthening    Theraband Level (Row) Level 4 (Blue)    Shoulder Extension Strengthening;Both;Theraband;20 reps    Theraband Level (Shoulder Extension) Level 4 (Blue)      Lumbar Exercises: Supine   Bridge Compliant;20 reps;2 seconds      Manual Therapy   Manual Therapy Manual Traction    Manual Traction sheet traction                      PT Short Term Goals - 10/02/20 1717       PT SHORT TERM GOAL #1   Title Patient will be independent with initial HEP for strength/stretching/ROM (ALL STGs: at 4th Visit)    Baseline no HEP established    Time 4    Period Weeks    Status On-going      PT SHORT TERM GOAL #2   Title to be assessed and LTG to be set as applicable  Baseline TBA    Time 4    Period Weeks    Status Achieved               PT Long Term Goals - 10/23/20 1451       PT LONG TERM GOAL #1   Title Patient will be independent with final HEP for strength/balance/stretching and daily walking program (ALL LTGs Due: at 9th visit)    Baseline no HEP established    Time 8    Period Weeks    Status On-going      PT LONG TERM GOAL #2   Title Pt will be able to tolerate ambulating at least 595' during 6 MWT with rollator to demo MDIC and improved community mobility    Baseline 395' with rollator    Time 8    Period Weeks    Status On-going      PT LONG TERM GOAL #3   Title Pt will have improved Oswestry Low Back Disability score by at least 11 points to demonstrate MDC    Baseline 37/50    Time 8    Period Weeks    Status On-going      PT LONG TERM GOAL #4   Title Patient will improve cervical ROM by 8 degrees in all direction to demonstrate improved ability to scan environment    Baseline see  flowsheet    Time 8    Period Weeks    Status On-going      PT LONG TERM GOAL #5   Title Patient will be able to ambulate x 100 ft with LRAD vs. No AD and Mod I for improved household mobility    Baseline 50 ft [facility] Supervision    Time 8    Period Weeks    Status On-going                   Plan - 11/04/20 1435     Clinical Impression Statement Pt continues to reports pain down his R side. Increase fatigue noted during today's session. Tactile cues for posture needed with standing row and extent ions. Pt did well with bridges increasing hip elevation as reps progressed. Positive response to sheet traction.    Personal Factors and Comorbidities Comorbidity 3+;Time since onset of injury/illness/exacerbation    Comorbidities HTN, A-Fib, Anemia, LBP    Examination-Activity Limitations Bed Mobility;Transfers;Stairs;Stand;Locomotion Level;Squat    Examination-Participation Restrictions Community Activity;Yard Work;Occupation    Rehab Potential Good    PT Frequency 1x / week    PT Duration 8 weeks    PT Treatment/Interventions ADLs/Self Care Home Management;Aquatic Therapy;Cryotherapy;Electrical Stimulation;Iontophoresis 4mg /ml Dexamethasone;Moist Heat;DME Instruction;Stair training;Functional mobility training;Therapeutic activities;Gait training;Therapeutic exercise;Balance training;Neuromuscular re-education;Patient/family education;Orthotic Fit/Training;Manual techniques;Passive range of motion;Dry needling;Spinal Manipulations;Joint Manipulations    PT Next Visit Plan assess sheet traction. focused on stretching for cervical/low back. Work on core strengthening as pt is able to tolerate. Work on pelvic/low back AROM.             Patient will benefit from skilled therapeutic intervention in order to improve the following deficits and impairments:  Abnormal gait, Decreased balance, Difficulty walking, Increased muscle spasms, Impaired sensation, Pain, Postural dysfunction,  Impaired flexibility, Decreased strength, Decreased knowledge of use of DME, Decreased activity tolerance, Decreased coordination, Decreased range of motion, Decreased endurance  Visit Diagnosis: Pain in right hip  Cervicalgia  Muscle weakness (generalized)     Problem List Patient Active Problem List   Diagnosis Date Noted   Atypical chest pain  10/01/2020   Chronic kidney disease 09/18/2020   MDD (major depressive disorder), recurrent, in partial remission (HCC) 09/11/2020   Gait abnormality 07/29/2020   Neck pain 07/29/2020   Chronic right-sided low back pain with right-sided sciatica 07/29/2020   Urinary incontinence 07/29/2020   Right hip pain 07/29/2020   MDD (major depressive disorder), recurrent episode, moderate (HCC) 04/15/2020   Persistent atrial fibrillation (HCC) 09/20/2019   Secondary hypercoagulable state (HCC) 09/20/2019   Abnormal nuclear stress test    Spinal stenosis of cervical region 08/13/2019   Protrusion of cervical intervertebral disc 08/13/2019   Shoulder pain    Chronic combined systolic and diastolic heart failure (HCC)    Atrial fibrillation with RVR (HCC) 05/07/2019   Hypertension 05/07/2019   Hypothyroidism 05/07/2019   Hypomagnesemia 05/07/2019   Elevated troponin 05/07/2019    Grayce Sessions 11/04/2020, 3:13 PM  [facility] [place] [facility] [address] [place], [place], [address] Phone: [phone]   Fax:  [phone]  Name: Seth Snyder MRN: [id number] Date of Birth: 05/21/1960

## 2020-11-05 ENCOUNTER — Ambulatory Visit (INDEPENDENT_AMBULATORY_CARE_PROVIDER_SITE_OTHER): Payer: Medicaid Other | Admitting: Orthopaedic Surgery

## 2020-11-05 ENCOUNTER — Encounter: Payer: Self-pay | Admitting: Orthopaedic Surgery

## 2020-11-05 VITALS — BP 151/94 | Ht 67.0 in | Wt 154.0 lb

## 2020-11-05 DIAGNOSIS — M5020 Other cervical disc displacement, unspecified cervical region: Secondary | ICD-10-CM | POA: Diagnosis not present

## 2020-11-05 NOTE — Progress Notes (Signed)
Office Visit Note   Patient: Seth Snyder           Date of Birth: 14-Mar-1961           MRN: [id number] Visit Date: 11/05/2020              Requested by: Barbette Merino, NP [address] [place],  [place] [address] PCP: Barbette Merino, NP   Assessment & Plan: Visit Diagnoses:  1. Protrusion of cervical intervertebral disc   2.   Right hip osteoarthritis  Plan: Patient is here to discuss two-level cervical fusion.  Last MRI actually looks a little bit better at C6-7 which was his most symptomatic level.  He still has stenosis at C5-6.  Hip seems to be bothering him more but he still has joint space maintained despite some lateral osteophyte formation.  We discussed possible hip arthroplasty if he developed progressive hip osteoarthritis.  Would recommend continue follow-up recheck 3 months.  If he develops increasing symptoms we can reconsider the two-level cervical fusion.  Currently his MRI looks better and his symptoms do not seem to have progressed so I would recommend deferring surgical intervention for his cervical spine at this point.  Recheck 3 months.  Follow-Up Instructions: Return in about 3 months (around 02/05/2021).   Orders:  No orders of the defined types were placed in this encounter.  No orders of the defined types were placed in this encounter.     Procedures: No procedures performed   Clinical Data: No additional findings.   Subjective: Chief Complaint  Patient presents with   Neck - Pain   Lower Back - Pain   Right Hip - Pain    HPI 60 year old male seen with interpreter.  He is using a rolling walker and had a recent new cervical MRI scan done on 08/22/2020 which shows decreased size in the right foraminal disc extrusion at C6-7 with improvement moderate right neural foraminal stenosis.  Unchanged spinal stenosis at C5-6.  He states he has had increased pain in his right groin and x-ray showed some spurring of the right hip with maintained  joint space.  Patient also had MRI scan which showed some left lateral recess narrowing at L4-5 and right subarticular disc protrusion L5-S1 patient remains on amiodarone, Celexa and also Eliquis.  He does not think his neck and right shoulder pain is progressed.  We had discussed two-level cervical fusion at C5-6 and C6-7 in the past.  Review of Systems all other systems updated unchanged from 04/22/2020 office visit other than as mentioned above.   Objective: Vital Signs: BP (!) 151/94   Ht 5\' 7"  (1.702 m)   Wt 154 lb (69.9 kg)   BMI 24.12 kg/m   Physical Exam Constitutional:      Appearance: He is well-developed.  HENT:     Head: Normocephalic and atraumatic.     Right Ear: External ear normal.     Left Ear: External ear normal.  Eyes:     Pupils: Pupils are equal, round, and reactive to light.  Neck:     Thyroid: No thyromegaly.     Trachea: No tracheal deviation.  Cardiovascular:     Rate and Rhythm: Normal rate.  Pulmonary:     Effort: Pulmonary effort is normal.     Breath sounds: No wheezing.  Abdominal:     General: Bowel sounds are normal.     Palpations: Abdomen is soft.  Musculoskeletal:  Cervical back: Neck supple.  Skin:    General: Skin is warm and dry.     Capillary Refill: Capillary refill takes less than 2 seconds.  Neurological:     Mental Status: He is alert and oriented to person, place, and time.  Psychiatric:        Behavior: Behavior normal.        Thought Content: Thought content normal.        Judgment: Judgment normal.    Ortho Exam patient is ambulatory slows stride gait and can ambulate without his walker.  Negative Trendelenburg gait.  Some brachial plexus tenderness on the right.  Gait is not as wide and is ambulating better than 04/22/2020 office visit.  2+ upper extremity reflexes.  Biceps triceps and wrist extension is strong right and left. Minimal discomfort with right hip internal rotation at 25 degrees symmetrical with the opposite  left.  Minimal discomfort with FABER positioning. Specialty Comments:  No specialty comments available.  Imaging: Narrative & Impression  CLINICAL DATA:  Ataxia with chronic neck pain   EXAM: MRI CERVICAL SPINE WITHOUT CONTRAST   TECHNIQUE: Multiplanar, multisequence MR imaging of the cervical spine was performed. No intravenous contrast was administered.   COMPARISON:  07/19/2019   FINDINGS: Alignment: Physiologic.   Vertebrae: No fracture, evidence of discitis, or bone lesion.   Cord: Normal signal and morphology.   Posterior Fossa, vertebral arteries, paraspinal tissues: Negative.   Disc levels:   C1-2: Unremarkable.   C2-3: Normal disc space and facet joints. There is no spinal canal stenosis. No neural foraminal stenosis.   C3-4: Unchanged left asymmetric disc bulge and uncovertebral hypertrophy. There is no spinal canal stenosis. Unchanged severe left neural foraminal stenosis.   C4-5: Small disc bulge with uncovertebral spurring. There is no spinal canal stenosis. Mild bilateral neural foraminal stenosis.   C5-6: Small central disc protrusion with right-greater-than-left uncovertebral hypertrophy. Mild spinal canal stenosis. Moderate right and mild neural foraminal stenosis.   C6-7: Small disc bulge with bilateral uncovertebral hypertrophy. Previously visible right foraminal extrusion has decreased in size. Spinal canal stenosis. Improved moderate right neural foraminal stenosis.   C7-T1: Normal disc space and facet joints. There is no spinal canal stenosis. No neural foraminal stenosis.   IMPRESSION: 1. Decreased size of right foraminal disc extrusion at C6-7 with improved moderate right neural foraminal stenosis. 2. Unchanged mild spinal canal stenosis and moderate right neural foraminal stenosis at C5-6. 3. 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     Electronically Signed   By: Deatra Robinson M.D.   On: 08/22/2020 20:37     PMFS  History: Patient Active Problem List   Diagnosis Date Noted   Atypical chest pain 10/01/2020   Chronic kidney disease 09/18/2020   MDD (major depressive disorder), recurrent, in partial remission (HCC) 09/11/2020   Gait abnormality 07/29/2020   Neck pain 07/29/2020   Chronic right-sided low back pain with right-sided sciatica 07/29/2020   Urinary incontinence 07/29/2020   Right hip pain 07/29/2020   MDD (major depressive disorder), recurrent episode, moderate (HCC) 04/15/2020   Persistent atrial fibrillation (HCC) 09/20/2019   Secondary hypercoagulable state (HCC) 09/20/2019   Abnormal nuclear stress test    Spinal stenosis of cervical region 08/13/2019   Protrusion of cervical intervertebral disc 08/13/2019   Shoulder pain    Chronic combined systolic and diastolic heart failure (HCC)    Atrial fibrillation with RVR (HCC) 05/07/2019   Hypertension 05/07/2019   Hypothyroidism 05/07/2019   Hypomagnesemia 05/07/2019  Elevated troponin 05/07/2019   Past Medical History:  Diagnosis Date   Anemia    Atrial fibrillation (HCC)    Atypical chest pain 10/01/2020   Dizziness    Hypertension    Low back pain     Family History  Problem Relation Age of Onset   Diabetes Mother    Other Father        unsure of medical history   Diabetes Sister    Mental illness Sister    Mental illness Brother     Past Surgical History:  Procedure Laterality Date   CARDIOVERSION N/A 05/10/2019   Procedure: CARDIOVERSION;  Surgeon: Lewayne Bunting, MD;  Location: [facility] ENDOSCOPY;  Service: Cardiovascular;  Laterality: N/A;   COLONOSCOPY  07/12/2013   LEFT HEART CATH AND CORONARY ANGIOGRAPHY N/A 08/30/2019   Procedure: LEFT HEART CATH AND CORONARY ANGIOGRAPHY;  Surgeon: Corky Crafts, MD;  Location: [facility] INVASIVE CV LAB;  Service: Cardiovascular;  Laterality: N/A;   TEE WITHOUT CARDIOVERSION N/A 05/10/2019   Procedure: TRANSESOPHAGEAL ECHOCARDIOGRAM (TEE);  Surgeon: Lewayne Bunting, MD;   Location: [facility] ENDOSCOPY;  Service: Cardiovascular;  Laterality: N/A;   Social History   Occupational History   Occupation: Disabled  Tobacco Use   Smoking status: Former    Types: Cigarettes    Quit date: 12/21/2018    Years since quitting: 1.8   Smokeless tobacco: Never  Vaping Use   Vaping Use: Never used  Substance and Sexual Activity   Alcohol use: Not Currently   Drug use: Never   Sexual activity: Not on file

## 2020-11-06 ENCOUNTER — Encounter: Payer: Self-pay | Admitting: Physical Therapy

## 2020-11-06 ENCOUNTER — Other Ambulatory Visit: Payer: Self-pay

## 2020-11-06 ENCOUNTER — Ambulatory Visit: Payer: Medicaid Other

## 2020-11-06 ENCOUNTER — Ambulatory Visit: Payer: Medicaid Other | Admitting: Physical Therapy

## 2020-11-06 DIAGNOSIS — M6281 Muscle weakness (generalized): Secondary | ICD-10-CM

## 2020-11-06 DIAGNOSIS — M542 Cervicalgia: Secondary | ICD-10-CM

## 2020-11-06 DIAGNOSIS — M25551 Pain in right hip: Secondary | ICD-10-CM

## 2020-11-06 DIAGNOSIS — R2689 Other abnormalities of gait and mobility: Secondary | ICD-10-CM | POA: Diagnosis not present

## 2020-11-06 DIAGNOSIS — R293 Abnormal posture: Secondary | ICD-10-CM

## 2020-11-06 NOTE — Therapy (Signed)
[address] [place], [place], [address] Phone: [phone]   Fax:  [phone]  Physical Therapy Treatment  Patient Details  Name: Seth Snyder MRN: [id number] Date of Birth: 1960-12-09 Referring Provider (PT): Marcial Pacas, MD   Encounter Date: 11/06/2020   PT End of Session - 11/06/20 1509     Visit Number 11    PT Start Time 1430    PT Stop Time 1510    PT Time Calculation (min) 40 min    Activity Tolerance Patient limited by pain;Patient limited by fatigue;Patient tolerated treatment well             Past Medical History:  Diagnosis Date   Anemia    Atrial fibrillation ([place])    Atypical chest pain 10/01/2020   Dizziness    Hypertension    Low back pain     Past Surgical History:  Procedure Laterality Date   CARDIOVERSION N/A 05/10/2019   Procedure: CARDIOVERSION;  Surgeon: Lelon Perla, MD;  Location: [facility] [place] [facility] ENDOSCOPY;  Service: Cardiovascular;  Laterality: N/A;   COLONOSCOPY  07/12/2013   LEFT HEART CATH AND CORONARY ANGIOGRAPHY N/A 08/30/2019   Procedure: LEFT HEART CATH AND CORONARY ANGIOGRAPHY;  Surgeon: Jettie Booze, MD;  Location: [place] CV LAB;  Service: Cardiovascular;  Laterality: N/A;   TEE WITHOUT CARDIOVERSION N/A 05/10/2019   Procedure: TRANSESOPHAGEAL ECHOCARDIOGRAM (TEE);  Surgeon: Lelon Perla, MD;  Location: [facility] ENDOSCOPY;  Service: Cardiovascular;  Laterality: N/A;    There were no vitals filed for this visit.   Subjective Assessment - 11/06/20 1431     Subjective Pt enters clinic with band aid below his R knee reporting a fall in the shower Tuesday    Diagnostic tests EMG nerve conduction study showed no significant abnormality, in specific, there is no evidence of bilateral lumbosacral radiculopathy, or right cervical radiculopathy.MRI of cervical spine showed multilevel degenerative changes, most prominent lower cervical region, C5-6, C6-T1, with mild canal  stenosis, no cord signal abnormality, variable  degree of foraminal narrowing. MRI lumbar spine showed multilevel degenerative changes, no evidence of spinal canal or nerve root compression    Currently in Pain? Yes    Pain Score 8     Pain Location Leg    Pain Orientation Right                               [facility] Adult PT Treatment/Exercise - 11/06/20 0001       Lumbar Exercises: Stretches   Passive Hamstring Stretch Right;10 seconds;Left;4 reps    Lower Trunk Rotation 3 reps;10 seconds    Hip Flexor Stretch Right;5 reps;20 seconds      Lumbar Exercises: Aerobic   Nustep L3 x 6 min      Lumbar Exercises: Machines for Strengthening   Cybex Knee Extension 10lb 2x10    Cybex Knee Flexion 25lb 2x10      Lumbar Exercises: Standing   Shoulder Extension Strengthening;Power Tower;Both;20 reps    Shoulder Extension Limitations 5      Manual Therapy   Manual Therapy Manual Traction    Manual Traction sheet traction                      PT Short Term Goals - 10/02/20 1717       PT SHORT TERM GOAL #1   Title Patient will be independent with initial HEP  for strength/stretching/ROM (ALL STGs: at 4th Visit)    Baseline no HEP established    Time 4    Period Weeks    Status On-going      PT SHORT TERM GOAL #2   Title 6MWT to be assessed and LTG to be set as applicable    Baseline TBA    Time 4    Period Weeks    Status Achieved               PT Long Term Goals - 11/06/20 1444       PT LONG TERM GOAL #4   Title Patient will improve cervical ROM by 8 degrees in all direction to demonstrate improved ability to scan environment    Status On-going      PT LONG TERM GOAL #5   Title Patient will be able to ambulate x 100 ft with LRAD vs. No AD and Mod I for improved household mobility    Status Partially Met                   Plan - 11/06/20 1509     Clinical Impression Statement Pt went to MD yesterday reports MD spoke of a  possible hip replacement. Increase resistance tolerated with leg curls and extensions. Pt is very slow with machine level LE interventions requiring increase time. Tactile cues needed with shoulder ext to maintain good posture. Bilateral HS tightness remains.    Personal Factors and Comorbidities Comorbidity 3+;Time since onset of injury/illness/exacerbation    Comorbidities HTN, A-Fib, Anemia, LBP    Examination-Activity Limitations Bed Mobility;Transfers;Stairs;Stand;Locomotion Level;Squat    Examination-Participation Restrictions Community Activity;Yard Work;Occupation    Stability/Clinical Decision Making Stable/Uncomplicated    Rehab Potential Good    PT Frequency 1x / week    PT Duration 8 weeks    PT Treatment/Interventions ADLs/Self Care Home Management;Aquatic Therapy;Cryotherapy;Electrical Stimulation;Iontophoresis 9m/ml Dexamethasone;Moist Heat;DME Instruction;Stair training;Functional mobility training;Therapeutic activities;Gait training;Therapeutic exercise;Balance training;Neuromuscular re-education;Patient/family education;Orthotic Fit/Training;Manual techniques;Passive range of motion;Dry needling;Spinal Manipulations;Joint Manipulations    PT Next Visit Plan focused on stretching for cervical/low back. Work on core strengthening as pt is able to tolerate. Work on pelvic/low back AROM.             Patient will benefit from skilled therapeutic intervention in order to improve the following deficits and impairments:  Abnormal gait, Decreased balance, Difficulty walking, Increased muscle spasms, Impaired sensation, Pain, Postural dysfunction, Impaired flexibility, Decreased strength, Decreased knowledge of use of DME, Decreased activity tolerance, Decreased coordination, Decreased range of motion, Decreased endurance  Visit Diagnosis: Pain in right hip  Cervicalgia  Abnormal posture  Muscle weakness (generalized)     Problem List Patient Active Problem List    Diagnosis Date Noted   Atypical chest pain 10/01/2020   Chronic kidney disease 09/18/2020   MDD (major depressive disorder), recurrent, in partial remission ([place] 09/11/2020   Gait abnormality 07/29/2020   Neck pain 07/29/2020   Chronic right-sided low back pain with right-sided sciatica 07/29/2020   Urinary incontinence 07/29/2020   Right hip pain 07/29/2020   MDD (major depressive disorder), recurrent episode, moderate ([facility] 04/15/2020   Persistent [place] 09/20/2019   Secondary hypercoagulable state ([place] 09/20/2019   Abnormal nuclear stress test    Spinal stenosis of cervical region 08/13/2019   Protrusion of cervical intervertebral disc 08/13/2019   Shoulder pain    Chronic combined systolic and diastolic heart failure (HCC)    Atrial fibrillation with RVR ([place] 05/07/2019   Hypertension  05/07/2019   Hypothyroidism 05/07/2019   Hypomagnesemia 05/07/2019   Elevated troponin 05/07/2019    Scot Jun 11/06/2020, 3:13 PM  [address] [place], [place], [address] Phone: [phone]   Fax:  [phone]  Name: Seth Snyder MRN: [id number] Date of Birth: 02-28-1961

## 2020-11-07 ENCOUNTER — Encounter: Payer: Self-pay | Admitting: Pulmonary Disease

## 2020-11-07 ENCOUNTER — Ambulatory Visit (INDEPENDENT_AMBULATORY_CARE_PROVIDER_SITE_OTHER): Payer: Medicaid Other | Admitting: Pulmonary Disease

## 2020-11-07 VITALS — BP 128/72 | HR 57 | Temp 98.2°F | Ht 67.0 in | Wt 153.4 lb

## 2020-11-07 DIAGNOSIS — J441 Chronic obstructive pulmonary disease with (acute) exacerbation: Secondary | ICD-10-CM | POA: Diagnosis not present

## 2020-11-07 NOTE — Patient Instructions (Signed)
I am glad you are stable with your breathing Continue the advair inhaler as prescribed Follow-up in 6 months.

## 2020-11-07 NOTE — Progress Notes (Signed)
Seth Snyder    [id number]    30-Jan-1961  Primary Care Physician:King, Shana Chute, NP  Referring Physician: Barbette Merino, NP [address] [place],  [place] [address]  Chief complaint:   HPI: 60 year old Nepali speaker with history of atrial fibrillation on Eliquis, major depression, nonischemic cardiomyopathy.  Complains of dyspnea on exertion.  He has constant chest pressure, epigastric pain.  Follows closely with cardiology for this.  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  No mold, hot tub, Jacuzzi Smoking history: 15-pack-year smoker.  Quit in 2020 Travel history: Immigrated from [place] in 2011, previously lived in [place] and [place].  No significant recent travel Relevant family history: No family history of lung disease  Interim history: Started on advair at last visit after PFTs show moderate COPD.  He notes some improvement in breathing with that   Outpatient Encounter Medications as of 11/07/2020  Medication Sig   acetaminophen (TYLENOL) 500 MG tablet Take 1,000 mg by mouth daily as needed for mild pain.   albuterol (VENTOLIN HFA) 108 (90 Base) MCG/ACT inhaler Inhale 2 puffs into the lungs every 6 (six) hours as needed for wheezing or shortness of breath.   amiodarone (PACERONE) 100 MG tablet Take 1 tablet (100 mg total) by mouth daily.   atorvastatin (LIPITOR) 40 MG tablet TAKE 1 TABLET (40 MG TOTAL) BY MOUTH DAILY AT 6 PM.   carvedilol (COREG) 25 MG tablet Take 25 mg by mouth 2 (two) times daily.   citalopram (CELEXA) 40 MG tablet Take 1 tablet (40 mg total) by mouth daily.   DULoxetine (CYMBALTA) 60 MG capsule TAKE 1 CAPSULE BY MOUTH EVERY DAY   ELIQUIS 5 MG TABS tablet TAKE 1 TABLET BY MOUTH TWICE A DAY   fluticasone-salmeterol (ADVAIR HFA) 115-21 MCG/ACT inhaler Inhale 2 puffs into the lungs 2 (two)  times daily.   gabapentin (NEURONTIN) 300 MG capsule Take 3 capsules (900 mg total) by mouth at bedtime.   hydrALAZINE (APRESOLINE) 50 MG tablet Take 50 mg by mouth as directed. TAKE 1/2 TABLET AS NEEDED FOR BLOOD PRESSURE ABOVE 150   levothyroxine (SYNTHROID) 25 MCG tablet Take 1 tablet (25 mcg total) by mouth daily before breakfast.   pantoprazole (PROTONIX) 40 MG tablet Take 1 tablet (40 mg total) by mouth daily.   traZODone (DESYREL) 50 MG tablet Take half tablet at bedtime as needed for sleep.   amLODipine (NORVASC) 2.5 MG tablet Take 1 tablet (2.5 mg total) by mouth daily.   No facility-administered encounter medications on file as of 11/07/2020.    Physical Exam: Blood pressure 116/70, pulse 60, temperature (!) 97.2 F (36.2 C), temperature source Temporal, height 5\' 7"  (1.702 m), weight 154 lb 6.4 oz (70 kg), SpO2 98 %. Gen:      No acute distress HEENT:  EOMI, sclera anicteric Neck:     No masses; no thyromegaly Lungs:    Clear to auscultation bilaterally; normal respiratory effort CV:         Regular rate and rhythm; no murmurs Abd:      + bowel sounds; soft, non-tender; no palpable masses, no distension Ext:    No edema; adequate peripheral perfusion Skin:      Warm and dry; no rash Neuro: alert and oriented x 3 Psych: normal mood and affect  Data Reviewed: Imaging:  Chest x-ray 06/20/2020-no active disease.  I have reviewed the images personally.  PFTs: 08/06/2020 FVC 3.07 [78%], FEV1 1.86 [60%], F/F 61, TLC 6.03 [94%], DLCO 20.98 [74%] Moderate obstructive airway disease with air trapping, mild diffusion defect  Labs: CBC 06/20/2020-WBC 6.1, eos 3%, absolute eosinophil count 183  Assessment:  Moderate COPD PFTs reviewed with patient Shows moderate COPD Symptoms improved with Advair.  Continue current therapy  He does not meet criteria for lung cancer screening given less than 20-pack-year smoking history We will continue to follow with regular chest  x-rays.  Plan/Recommendations: Continue advair Return to clinic in 6 months  Chilton Greathouse MD [facility] 11/07/2020, 9:13 AM  CC: Barbette Merino, NP

## 2020-11-12 ENCOUNTER — Other Ambulatory Visit: Payer: Self-pay

## 2020-11-12 ENCOUNTER — Telehealth (INDEPENDENT_AMBULATORY_CARE_PROVIDER_SITE_OTHER): Payer: Medicaid Other | Admitting: Nurse Practitioner

## 2020-11-12 ENCOUNTER — Other Ambulatory Visit: Payer: Medicaid Other

## 2020-11-12 DIAGNOSIS — G8929 Other chronic pain: Secondary | ICD-10-CM | POA: Diagnosis not present

## 2020-11-12 DIAGNOSIS — R609 Edema, unspecified: Secondary | ICD-10-CM | POA: Diagnosis not present

## 2020-11-12 DIAGNOSIS — M5442 Lumbago with sciatica, left side: Secondary | ICD-10-CM

## 2020-11-12 DIAGNOSIS — M5441 Lumbago with sciatica, right side: Secondary | ICD-10-CM

## 2020-11-12 DIAGNOSIS — E039 Hypothyroidism, unspecified: Secondary | ICD-10-CM

## 2020-11-12 MED ORDER — CVS HEATING PAD PADS: 1.0000 "application " | MEDICATED_PAD | Freq: Three times a day (TID) | 0 refills | Status: DC

## 2020-11-12 NOTE — Progress Notes (Signed)
   [facility] Patient [facility] [address] [place], [place]  [address] Phone:  [phone]   Fax:  [phone] Virtual Visit via Telephone Note  I connected with Seth Snyder on 11/12/20 at  1:40 PM EDT by telephone and verified that I am speaking with the correct person using two identifiers.   I discussed the limitations, risks, security and privacy concerns of performing an evaluation and management service by telephone and the availability of in person appointments. I also discussed with the patient that there may be a patient responsible charge related to this service. The patient expressed understanding and agreed to proceed.  Patient home Provider Office  History of Present Illness:  Seth Snyder  has a past medical history of Anemia, Atrial fibrillation (HCC), Atypical chest pain (10/01/2020), Dizziness, Hypertension, and Low back pain.   He is currently in physical therapy for his lumbar spine.  He is waiting for surgery which has been delayed due to his ongoing cardiac issues.  His blood pressure has been stable over the last month and he has not experienced any recent episodes of syncope.  He reports being compliant with his medication and denies any side effects.  ROS   Observations/Objective: No exam; telephone visit  Assessment and Plan: 1. Chronic midline low back pain with bilateral sciatica Persistent however stable Requesting additional physical therapy - Ambulatory referral to Physical Therapy - Heating Pads ([employer] HEATING PAD) PADS; 1 application by Does not apply route 3 (three) times daily. 15 minute intervals only  Dispense: 1 each; Refill: 0  2. Peripheral edema - Compression stockings   Follow Up Instructions:   66-month follow-up for evaluation of depression    I discussed the assessment and treatment plan with the patient. The patient was provided an opportunity to ask questions and all were answered. The patient agreed with the plan and  demonstrated an understanding of the instructions.   The patient was advised to call back or seek an in-person evaluation if the symptoms worsen or if the condition fails to improve as anticipated.  I provided 5 minutes of telephone- visit time during this encounter.   Barbette Merino, NP

## 2020-11-13 ENCOUNTER — Ambulatory Visit: Payer: Medicaid Other

## 2020-11-13 LAB — T4, FREE: Free T4: 1.01 ng/dL (ref 0.82–1.77)

## 2020-11-13 LAB — TSH: TSH: 9.69 u[IU]/mL — ABNORMAL HIGH (ref 0.450–4.500)

## 2020-11-13 LAB — T3: T3, Total: 99 ng/dL (ref 71–180)

## 2020-11-14 ENCOUNTER — Other Ambulatory Visit: Payer: Self-pay | Admitting: Nurse Practitioner

## 2020-11-14 ENCOUNTER — Other Ambulatory Visit (HOSPITAL_COMMUNITY): Payer: Self-pay | Admitting: Psychiatry

## 2020-11-14 ENCOUNTER — Other Ambulatory Visit: Payer: Self-pay | Admitting: General Practice

## 2020-11-14 DIAGNOSIS — F3341 Major depressive disorder, recurrent, in partial remission: Secondary | ICD-10-CM

## 2020-11-14 NOTE — Telephone Encounter (Signed)
Prescription refill request for Eliquis received. Indication: Afib Last office visit: 10/20/20 Lanna Poche)  Scr: 1.44 (10/02/20) Age: 59 Weight: 69.6kg  Appropriate dose and refill sent to requested pharmacy.

## 2020-11-18 ENCOUNTER — Ambulatory Visit: Payer: Medicaid Other | Admitting: Physical Therapy

## 2020-11-18 ENCOUNTER — Encounter: Payer: Self-pay | Admitting: Physical Therapy

## 2020-11-18 ENCOUNTER — Other Ambulatory Visit: Payer: Self-pay

## 2020-11-18 DIAGNOSIS — M25551 Pain in right hip: Secondary | ICD-10-CM

## 2020-11-18 DIAGNOSIS — M542 Cervicalgia: Secondary | ICD-10-CM

## 2020-11-18 DIAGNOSIS — R2689 Other abnormalities of gait and mobility: Secondary | ICD-10-CM | POA: Diagnosis not present

## 2020-11-18 DIAGNOSIS — M6281 Muscle weakness (generalized): Secondary | ICD-10-CM

## 2020-11-18 DIAGNOSIS — R293 Abnormal posture: Secondary | ICD-10-CM

## 2020-11-18 NOTE — Therapy (Signed)
[place]. [place], [place], [address] Phone: [phone]   Fax:  [phone]  Physical Therapy Treatment  Patient Details  Name: Seth Snyder MRN: [id number] Date of Birth: 04/11/1960 Referring Provider (PT): Marcial Pacas, MD   Encounter Date: 11/18/2020   PT End of Session - 11/18/20 1512     Visit Number 10    Authorization Type [employer] (VL: 27)    Authorization Time Period no authorization required    PT Start Time 1436    PT Stop Time 1515    PT Time Calculation (min) 39 min    Activity Tolerance Patient limited by pain;Patient tolerated treatment well    Behavior During Therapy [facility] for tasks assessed/performed             Past Medical History:  Diagnosis Date   Anemia    Atrial fibrillation ([place])    Atypical chest pain 10/01/2020   Dizziness    Hypertension    Low back pain     Past Surgical History:  Procedure Laterality Date   CARDIOVERSION N/A 05/10/2019   Procedure: CARDIOVERSION;  Surgeon: Lelon Perla, MD;  Location: [facility] ENDOSCOPY;  Service: Cardiovascular;  Laterality: N/A;   COLONOSCOPY  07/12/2013   LEFT HEART CATH AND CORONARY ANGIOGRAPHY N/A 08/30/2019   Procedure: LEFT HEART CATH AND CORONARY ANGIOGRAPHY;  Surgeon: Jettie Booze, MD;  Location: [facility] CV LAB;  Service: Cardiovascular;  Laterality: N/A;   TEE WITHOUT CARDIOVERSION N/A 05/10/2019   Procedure: TRANSESOPHAGEAL ECHOCARDIOGRAM (TEE);  Surgeon: Lelon Perla, MD;  Location: [facility] ENDOSCOPY;  Service: Cardiovascular;  Laterality: N/A;    There were no vitals filed for this visit.   Subjective Assessment - 11/18/20 1437     Subjective Pain in the anterior R hip, back and R shoulder    Currently in Pain? Yes    Pain Score 10-Worst pain ever    Pain Location Leg    Pain Orientation Right                [facility] PT Assessment - 11/18/20 0001       AROM   Cervical Extension 11     Cervical - Right Side Bend 25    Cervical - Left Side Bend 17    Cervical - Right Rotation 21    Cervical - Left Rotation 17                           [facility] Adult PT Treatment/Exercise - 11/18/20 0001       Lumbar Exercises: Aerobic   Nustep L3 x 6 min      Lumbar Exercises: Standing   Row Strengthening;Power tower;Both;10 reps    Row Limitations 10    Shoulder Extension Strengthening;Power Tower;Both;20 reps    Shoulder Extension Limitations 5    Other Standing Lumbar Exercises 6'' step up x10 each, ball squeezes 2x10    Other Standing Lumbar Exercises Resisted gait 20lb 4 way x3                      PT Short Term Goals - 10/02/20 1717       PT SHORT TERM GOAL #1   Title Patient will be independent with initial HEP for strength/stretching/ROM (ALL STGs: at 4th Visit)    Baseline no HEP established    Time 4    Period Weeks  Status On-going      PT SHORT TERM GOAL #2   Title 6MWT to be assessed and LTG to be set as applicable    Baseline TBA    Time 4    Period Weeks    Status Achieved               PT Long Term Goals - 11/18/20 1516       PT LONG TERM GOAL #1   Title Patient will be independent with final HEP for strength/balance/stretching and daily walking program (ALL LTGs Due: at 9th visit)    Status Partially Met      PT LONG TERM GOAL #2   Title Pt will be able to tolerate ambulating at least 595' during 6 MWT with rollator to demo MDIC and improved community mobility    Status On-going      PT LONG TERM GOAL #3   Title Pt will have improved Oswestry Low Back Disability score by at least 11 points to demonstrate [place]    Status On-going      PT LONG TERM GOAL #4   Title Patient will improve cervical ROM by 8 degrees in all direction to demonstrate improved ability to scan environment    Status Achieved      PT LONG TERM GOAL #5   Title Patient will be able to ambulate x 100 ft with LRAD vs. No AD and Mod I for  improved household mobility    Status Partially Met                   Plan - 11/18/20 1518     Clinical Impression Statement Some progress made increasing his cervical ROM. He does report less pain regarding his R shoulder overall. High pain rating to his R hip remains with no improvement. Hip weakness and instability noted with all directions of resisted gait. R hip weakness present with 6 inch step ups.    Personal Factors and Comorbidities Comorbidity 3+;Time since onset of injury/illness/exacerbation    Examination-Activity Limitations Bed Mobility;Transfers;Stairs;Stand;Locomotion Level;Squat    Examination-Participation Restrictions Community Activity;Yard Work;Occupation    Stability/Clinical Decision Making Stable/Uncomplicated    Rehab Potential Good    PT Frequency 1x / week    PT Duration 8 weeks    PT Treatment/Interventions ADLs/Self Care Home Management;Aquatic Therapy;Cryotherapy;Electrical Stimulation;Iontophoresis 79m/ml Dexamethasone;Moist Heat;DME Instruction;Stair training;Functional mobility training;Therapeutic activities;Gait training;Therapeutic exercise;Balance training;Neuromuscular re-education;Patient/family education;Orthotic Fit/Training;Manual techniques;Passive range of motion;Dry needling;Spinal Manipulations;Joint Manipulations    PT Next Visit Plan focused on stretching for cervical/low back. Work on core strengthening as pt is able to tolerate. Work on pelvic/low back AROM.             Patient will benefit from skilled therapeutic intervention in order to improve the following deficits and impairments:  Abnormal gait, Decreased balance, Difficulty walking, Increased muscle spasms, Impaired sensation, Pain, Postural dysfunction, Impaired flexibility, Decreased strength, Decreased knowledge of use of DME, Decreased activity tolerance, Decreased coordination, Decreased range of motion, Decreased endurance  Visit Diagnosis: Pain in right  hip  Cervicalgia  Muscle weakness (generalized)  Abnormal posture     Problem List Patient Active Problem List   Diagnosis Date Noted   Atypical chest pain 10/01/2020   Chronic kidney disease 09/18/2020   MDD (major depressive disorder), recurrent, in partial remission ([place] 09/11/2020   Gait abnormality 07/29/2020   Neck pain 07/29/2020   Urinary incontinence 07/29/2020   Right hip pain 07/29/2020   MDD (major depressive disorder), recurrent  episode, moderate ([place]) 04/15/2020   Persistent atrial fibrillation ([place]) 09/20/2019   Secondary hypercoagulable state ([place]) 09/20/2019   Abnormal nuclear stress test    Spinal stenosis of cervical region 08/13/2019   Protrusion of cervical intervertebral disc 08/13/2019   Shoulder pain    Chronic combined systolic and diastolic heart failure (HCC)    Atrial fibrillation with RVR ([place]) 05/07/2019   Hypothyroidism 05/07/2019   Hypomagnesemia 05/07/2019   Elevated troponin 05/07/2019   Anticoagulated 03/12/2019   Dry skin dermatitis 03/12/2019   Muscle spasm of right shoulder 03/12/2019   Peripheral edema 03/12/2019   Shifting sleep-work schedule, affecting sleep 03/31/2018   Tobacco abuse 03/31/2018   Balanitis 03/20/2018   Hx of gonorrhea 11/23/2017   Anemia 06/27/2015   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   H/O colonoscopy 07/12/2013   Atopic dermatitis 04/18/2013   Venous insufficiency of both lower extremities 04/12/2013   Low back pain 04/12/2013    Scot Jun 11/18/2020, 3:24 PM  [place]. [place], [place], [address] Phone: [phone]   Fax:  [phone]  Name: Micah Danil Wedge MRN: [id number] Date of Birth: [birth year]

## 2020-11-19 ENCOUNTER — Other Ambulatory Visit: Payer: Self-pay | Admitting: Nurse Practitioner

## 2020-11-19 DIAGNOSIS — E038 Other specified hypothyroidism: Secondary | ICD-10-CM

## 2020-11-19 NOTE — Telephone Encounter (Signed)
Message acknowledged and reviewed by provider.

## 2020-11-20 ENCOUNTER — Ambulatory Visit: Payer: Medicaid Other

## 2020-11-20 NOTE — Telephone Encounter (Signed)
Message acknowledged and reviewed by provider.

## 2020-11-25 ENCOUNTER — Ambulatory Visit: Payer: Medicaid Other | Attending: Neurology | Admitting: Physical Therapy

## 2020-11-25 ENCOUNTER — Encounter: Payer: Self-pay | Admitting: Physical Therapy

## 2020-11-25 ENCOUNTER — Other Ambulatory Visit: Payer: Self-pay

## 2020-11-25 DIAGNOSIS — R2689 Other abnormalities of gait and mobility: Secondary | ICD-10-CM | POA: Insufficient documentation

## 2020-11-25 DIAGNOSIS — M25551 Pain in right hip: Secondary | ICD-10-CM

## 2020-11-25 DIAGNOSIS — R293 Abnormal posture: Secondary | ICD-10-CM | POA: Diagnosis present

## 2020-11-25 DIAGNOSIS — G8929 Other chronic pain: Secondary | ICD-10-CM | POA: Insufficient documentation

## 2020-11-25 DIAGNOSIS — M6281 Muscle weakness (generalized): Secondary | ICD-10-CM

## 2020-11-25 DIAGNOSIS — M545 Low back pain, unspecified: Secondary | ICD-10-CM | POA: Diagnosis present

## 2020-11-25 DIAGNOSIS — M542 Cervicalgia: Secondary | ICD-10-CM | POA: Diagnosis present

## 2020-11-25 NOTE — Therapy (Signed)
[facility] [address]. [place], [place], [address] Phone: [phone]   Fax:  [phone]  Physical Therapy Treatment  Patient Details  Name: Seth Snyder MRN: [id number] Date of Birth: [birth year] Referring Provider (PT): Levert Feinstein, MD   Encounter Date: 11/25/2020   PT End of Session - 11/25/20 1501     Visit Number 11    Authorization Type [employer] (VL: 27)    Authorization Time Period no authorization required    PT Start Time 1430    PT Stop Time 1515    PT Time Calculation (min) 45 min    Activity Tolerance Patient tolerated treatment well    Behavior During Therapy [facility] for tasks assessed/performed;Flat affect             Past Medical History:  Diagnosis Date   Anemia    Atrial fibrillation (HCC)    Atypical chest pain 10/01/2020   Dizziness    Hypertension    Low back pain     Past Surgical History:  Procedure Laterality Date   CARDIOVERSION N/A 05/10/2019   Procedure: CARDIOVERSION;  Surgeon: Lewayne Bunting, MD;  Location: [facility] ENDOSCOPY;  Service: Cardiovascular;  Laterality: N/A;   COLONOSCOPY  07/12/2013   LEFT HEART CATH AND CORONARY ANGIOGRAPHY N/A 08/30/2019   Procedure: LEFT HEART CATH AND CORONARY ANGIOGRAPHY;  Surgeon: Corky Crafts, MD;  Location: [facility] INVASIVE CV LAB;  Service: Cardiovascular;  Laterality: N/A;   TEE WITHOUT CARDIOVERSION N/A 05/10/2019   Procedure: TRANSESOPHAGEAL ECHOCARDIOGRAM (TEE);  Surgeon: Lewayne Bunting, MD;  Location: [facility] ENDOSCOPY;  Service: Cardiovascular;  Laterality: N/A;    There were no vitals filed for this visit.   Subjective Assessment - 11/25/20 1428     Subjective Same pain in the hip, back, and shoulder all on the R side    Currently in Pain? Yes    Pain Score 8     Pain Location --   R hip, back, and shoulder                              [facility] Adult PT Treatment/Exercise - 11/25/20 0001       Lumbar  Exercises: Machines for Strengthening   Other Lumbar Machine Exercise Rows & Lats 25lb 2x10      Lumbar Exercises: Standing   Other Standing Lumbar Exercises Lateral step ups 6in x 10 each      Lumbar Exercises: Seated   Sit to Stand 10 reps   OHP yellow ball                     PT Short Term Goals - 10/02/20 1717       PT SHORT TERM GOAL #1   Title Patient will be independent with initial HEP for strength/stretching/ROM (ALL STGs: at 4th Visit)    Baseline no HEP established    Time 4    Period Weeks    Status On-going      PT SHORT TERM GOAL #2   Title to be assessed and LTG to be set as applicable    Baseline TBA    Time 4    Period Weeks    Status Achieved               PT Long Term Goals - 11/25/20 1505       PT LONG TERM GOAL #1  Title Patient will be independent with final HEP for strength/balance/stretching and daily walking program (ALL LTGs Due: at 9th visit)    Status Achieved      PT LONG TERM GOAL #3   Title Pt will have improved Oswestry Low Back Disability score by at least 11 points to demonstrate MDC    Status On-going      PT LONG TERM GOAL #4   Title Patient will improve cervical ROM by 8 degrees in all direction to demonstrate improved ability to scan environment    Status Achieved                   Plan - 11/25/20 1502     Clinical Impression Statement Pt enters in clinic ambulating with SBQU compared to rollator in previous treatment. He reports using cane at home. Pt also reports when his BP goes up he gets dizzy and use his walker. Despite no improvement in R hip, low back, and shoulder pain he did well completing all exercises interventions. Cue needed to full extent working LE with lateral step ups. Added seated rows and lat pull downs without issue. Cue needed not to allow LE to push against table with sit to stands.    Personal Factors and Comorbidities Comorbidity 3+;Time since onset of  injury/illness/exacerbation    Comorbidities HTN, A-Fib, Anemia, LBP    Examination-Activity Limitations Bed Mobility;Transfers;Stairs;Stand;Locomotion Level;Squat    Examination-Participation Restrictions Community Activity;Yard Work;Occupation    Stability/Clinical Decision Making Stable/Uncomplicated    Rehab Potential Good    PT Frequency 1x / week    PT Duration 8 weeks    PT Treatment/Interventions ADLs/Self Care Home Management;Aquatic Therapy;Cryotherapy;Electrical Stimulation;Iontophoresis 4mg /ml Dexamethasone;Moist Heat;DME Instruction;Stair training;Functional mobility training;Therapeutic activities;Gait training;Therapeutic exercise;Balance training;Neuromuscular re-education;Patient/family education;Orthotic Fit/Training;Manual techniques;Passive range of motion;Dry needling;Spinal Manipulations;Joint Manipulations    PT Next Visit Plan focused on stretching for cervical/low back. Work on core strengthening as pt is able to tolerate. Work on pelvic/low back AROM.             Patient will benefit from skilled therapeutic intervention in order to improve the following deficits and impairments:  Abnormal gait, Decreased balance, Difficulty walking, Increased muscle spasms, Impaired sensation, Pain, Postural dysfunction, Impaired flexibility, Decreased strength, Decreased knowledge of use of DME, Decreased activity tolerance, Decreased coordination, Decreased range of motion, Decreased endurance  Visit Diagnosis: Muscle weakness (generalized)  Abnormal posture  Cervicalgia  Pain in right hip     Problem List Patient Active Problem List   Diagnosis Date Noted   Atypical chest pain 10/01/2020   Chronic kidney disease 09/18/2020   MDD (major depressive disorder), recurrent, in partial remission (HCC) 09/11/2020   Gait abnormality 07/29/2020   Neck pain 07/29/2020   Urinary incontinence 07/29/2020   Right hip pain 07/29/2020   MDD (major depressive disorder), recurrent  episode, moderate (HCC) 04/15/2020   Persistent atrial fibrillation (HCC) 09/20/2019   Secondary hypercoagulable state (HCC) 09/20/2019   Abnormal nuclear stress test    Spinal stenosis of cervical region 08/13/2019   Protrusion of cervical intervertebral disc 08/13/2019   Shoulder pain    Chronic combined systolic and diastolic heart failure (HCC)    Atrial fibrillation with RVR (HCC) 05/07/2019   Hypothyroidism 05/07/2019   Hypomagnesemia 05/07/2019   Elevated troponin 05/07/2019   Anticoagulated 03/12/2019   Dry skin dermatitis 03/12/2019   Muscle spasm of right shoulder 03/12/2019   Peripheral edema 03/12/2019   Shifting sleep-work schedule, affecting sleep 03/31/2018   Tobacco abuse 03/31/2018  Balanitis 03/20/2018   Hx of gonorrhea 11/23/2017   Anemia 06/27/2015   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   H/O colonoscopy 07/12/2013   Atopic dermatitis 04/18/2013   Venous insufficiency of both lower extremities 04/12/2013   Low back pain 04/12/2013    Grayce Sessions 11/25/2020, 3:10 PM  [facility] [address]. [place], [place], [address] Phone: [phone]   Fax:  [phone]  Name: Seth Snyder MRN: [id number] Date of Birth: October 03, 1960

## 2020-11-28 ENCOUNTER — Ambulatory Visit (HOSPITAL_BASED_OUTPATIENT_CLINIC_OR_DEPARTMENT_OTHER): Payer: Medicaid Other | Admitting: Cardiovascular Disease

## 2020-12-02 ENCOUNTER — Encounter: Payer: Self-pay | Admitting: Physical Therapy

## 2020-12-02 ENCOUNTER — Ambulatory Visit: Payer: Medicaid Other | Admitting: Physical Therapy

## 2020-12-02 ENCOUNTER — Other Ambulatory Visit: Payer: Self-pay

## 2020-12-02 DIAGNOSIS — M6281 Muscle weakness (generalized): Secondary | ICD-10-CM

## 2020-12-02 DIAGNOSIS — M542 Cervicalgia: Secondary | ICD-10-CM

## 2020-12-02 DIAGNOSIS — R2689 Other abnormalities of gait and mobility: Secondary | ICD-10-CM

## 2020-12-02 DIAGNOSIS — M25551 Pain in right hip: Secondary | ICD-10-CM

## 2020-12-02 NOTE — Therapy (Signed)
[facility] [place] [facility] [address]. [place], [place], [address] Phone: [phone]   Fax:  [phone]  Physical Therapy Treatment  Patient Details  Name: Seth Snyder MRN: [id number] Date of Birth: [birth year] Referring Provider (PT): Levert Feinstein, MD   Encounter Date: 12/02/2020   PT End of Session - 12/02/20 1514     Visit Number 12    Authorization Type [employer] (VL: 27)    PT Start Time 1440    PT Stop Time 1530    PT Time Calculation (min) 50 min    Activity Tolerance Patient limited by pain    Behavior During Therapy [facility] for tasks assessed/performed;Flat affect             Past Medical History:  Diagnosis Date   Anemia    Atrial fibrillation (HCC)    Atypical chest pain 10/01/2020   Dizziness    Hypertension    Low back pain     Past Surgical History:  Procedure Laterality Date   CARDIOVERSION N/A 05/10/2019   Procedure: CARDIOVERSION;  Surgeon: Lewayne Bunting, MD;  Location: [facility] ENDOSCOPY;  Service: Cardiovascular;  Laterality: N/A;   COLONOSCOPY  07/12/2013   LEFT HEART CATH AND CORONARY ANGIOGRAPHY N/A 08/30/2019   Procedure: LEFT HEART CATH AND CORONARY ANGIOGRAPHY;  Surgeon: Corky Crafts, MD;  Location: [facility] INVASIVE CV LAB;  Service: Cardiovascular;  Laterality: N/A;   TEE WITHOUT CARDIOVERSION N/A 05/10/2019   Procedure: TRANSESOPHAGEAL ECHOCARDIOGRAM (TEE);  Surgeon: Lewayne Bunting, MD;  Location: [facility] ENDOSCOPY;  Service: Cardiovascular;  Laterality: N/A;    There were no vitals filed for this visit.   Subjective Assessment - 12/02/20 1446     Subjective Pain in the right hip and the right shoulder, low pain in the shoulder high pain in the hip    Currently in Pain? Yes    Pain Score 7     Pain Location Hip    Pain Orientation Right    Pain Descriptors / Indicators Burning    Pain Relieving Factors nothing helps                [facility] PT Assessment - 12/02/20 0001        Strength   Right Hip Flexion 3+/5    Right Hip Extension 4-/5    Left Hip Flexion 4/5    Left Hip Extension 4/5      Standardized Balance Assessment   Standardized Balance Assessment Timed Up and Go Test      Timed Up and Go Test   Normal TUG (seconds) 42    TUG Comments with 4WW                           [facility] Adult PT Treatment/Exercise - 12/02/20 0001       Lumbar Exercises: Stretches   Other Lumbar Stretch Exercise adductor stretch      Lumbar Exercises: Aerobic   Nustep L3 x 6 min      Lumbar Exercises: Supine   Clam 10 reps;3 seconds    Other Supine Lumbar Exercises 2# chest press and flexion shoulders, SAQ 3# right    Other Supine Lumbar Exercises feet on ball K2C, small trunk rotation, small bridge and isometrica abs      Moist Heat Therapy   Number Minutes Moist Heat 10 Minutes    Moist Heat Location Lumbar Spine;Hip      Electrical Stimulation  Electrical Stimulation Location right hip area    Electrical Stimulation Action IFC    Electrical Stimulation Parameters supine    Electrical Stimulation Goals Pain                       PT Short Term Goals - 10/02/20 1717       PT SHORT TERM GOAL #1   Title Patient will be independent with initial HEP for strength/stretching/ROM (ALL STGs: at 4th Visit)    Baseline no HEP established    Time 4    Period Weeks    Status On-going      PT SHORT TERM GOAL #2   Title to be assessed and LTG to be set as applicable    Baseline TBA    Time 4    Period Weeks    Status Achieved               PT Long Term Goals - 12/02/20 1518       PT LONG TERM GOAL #1   Title Patient will be independent with final HEP for strength/balance/stretching and daily walking program (ALL LTGs Due: at 9th visit)    Status Achieved      PT LONG TERM GOAL #2   Title Pt will be able to tolerate ambulating at least 595' during 6 MWT with rollator to demo MDIC and improved community mobility     Baseline 395' with rollator    Status On-going      PT LONG TERM GOAL #3   Title Pt will have improved Oswestry Low Back Disability score by at least 11 points to demonstrate MDC    Baseline 38/50    Status On-going      PT LONG TERM GOAL #4   Title Patient will improve cervical ROM by 8 degrees in all direction to demonstrate improved ability to scan environment    Status Achieved      PT LONG TERM GOAL #5   Title Patient will be able to ambulate x 100 ft with LRAD vs. No AD and Mod I for improved household mobility                   Plan - 12/02/20 1514     Clinical Impression Statement Patient continues to report high rating of pain on the right side, he seems to be more focused on the right anterior hip today, he is very tender here, the adductors are very tight.  Timed up and go test was 42 seconds with a 4WW.  He reports that he is weak, has had a slight gain in MMT for hip abduction, His pain levels are rated high most times, he does report needing assist to put on shoes and socks. He struggles to move from sit to supine and back up, due to weakness and pain.    PT Frequency 1x / week    PT Duration 8 weeks    PT Treatment/Interventions ADLs/Self Care Home Management;Aquatic Therapy;Cryotherapy;Electrical Stimulation;Iontophoresis 4mg /ml Dexamethasone;Moist Heat;DME Instruction;Stair training;Functional mobility training;Therapeutic activities;Gait training;Therapeutic exercise;Balance training;Neuromuscular re-education;Patient/family education;Orthotic Fit/Training;Manual techniques;Passive range of motion;Dry needling;Spinal Manipulations;Joint Manipulations    PT Next Visit Plan focused on stretching for cervical/low back. Work on core strengthening as pt is able to tolerate. Work on pelvic/low back AROM. More functiona gait    Consulted and Agree with Plan of Care Patient             Patient will benefit from skilled  therapeutic intervention in order to improve  the following deficits and impairments:  Abnormal gait, Decreased balance, Difficulty walking, Increased muscle spasms, Impaired sensation, Pain, Postural dysfunction, Impaired flexibility, Decreased strength, Decreased knowledge of use of DME, Decreased activity tolerance, Decreased coordination, Decreased range of motion, Decreased endurance  Visit Diagnosis: Muscle weakness (generalized)  Cervicalgia  Pain in right hip  Other abnormalities of gait and mobility     Problem List Patient Active Problem List   Diagnosis Date Noted   Atypical chest pain 10/01/2020   Chronic kidney disease 09/18/2020   MDD (major depressive disorder), recurrent, in partial remission (HCC) 09/11/2020   Gait abnormality 07/29/2020   Neck pain 07/29/2020   Urinary incontinence 07/29/2020   Right hip pain 07/29/2020   MDD (major depressive disorder), recurrent episode, moderate (HCC) 04/15/2020   Persistent atrial fibrillation (HCC) 09/20/2019   Secondary hypercoagulable state (HCC) 09/20/2019   Abnormal nuclear stress test    Spinal stenosis of cervical region 08/13/2019   Protrusion of cervical intervertebral disc 08/13/2019   Shoulder pain    Chronic combined systolic and diastolic heart failure (HCC)    Atrial fibrillation with RVR (HCC) 05/07/2019   Hypothyroidism 05/07/2019   Hypomagnesemia 05/07/2019   Elevated troponin 05/07/2019   Anticoagulated 03/12/2019   Dry skin dermatitis 03/12/2019   Muscle spasm of right shoulder 03/12/2019   Peripheral edema 03/12/2019   Shifting sleep-work schedule, affecting sleep 03/31/2018   Tobacco abuse 03/31/2018   Balanitis 03/20/2018   Hx of gonorrhea 11/23/2017   Anemia 06/27/2015   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   H/O colonoscopy 07/12/2013   Atopic dermatitis 04/18/2013   Venous insufficiency of both lower extremities 04/12/2013   Low back pain 04/12/2013    Jearld Lesch, PT 12/02/2020, 3:23 PM  [facility] [address] [place], [place], [address] Phone: [phone]   Fax:  [phone]  Name: Seth Snyder MRN: [id number] Date of Birth: Oct 05, 1960

## 2020-12-03 ENCOUNTER — Encounter: Payer: Self-pay | Admitting: Physical Medicine and Rehabilitation

## 2020-12-10 ENCOUNTER — Ambulatory Visit: Payer: Medicaid Other | Admitting: Physical Therapy

## 2020-12-10 ENCOUNTER — Encounter: Payer: Self-pay | Admitting: Physical Therapy

## 2020-12-10 ENCOUNTER — Other Ambulatory Visit: Payer: Self-pay

## 2020-12-10 DIAGNOSIS — M25551 Pain in right hip: Secondary | ICD-10-CM

## 2020-12-10 DIAGNOSIS — G8929 Other chronic pain: Secondary | ICD-10-CM

## 2020-12-10 DIAGNOSIS — M545 Low back pain, unspecified: Secondary | ICD-10-CM

## 2020-12-10 DIAGNOSIS — M6281 Muscle weakness (generalized): Secondary | ICD-10-CM

## 2020-12-10 DIAGNOSIS — R293 Abnormal posture: Secondary | ICD-10-CM

## 2020-12-10 NOTE — Therapy (Signed)
[facility] [address] [place], [place], [address] Phone: [phone]   Fax:  [phone]  Physical Therapy Treatment  Patient Details  Name: Suleyman Jasyn Mey MRN: [id number] Date of Birth: 28-Oct-1960 Referring Provider (PT): Levert Feinstein, MD   Encounter Date: 12/10/2020   PT End of Session - 12/10/20 1326     Visit Number 13    PT Start Time 1326    PT Stop Time 1412    PT Time Calculation (min) 46 min    Activity Tolerance Patient limited by pain    Behavior During Therapy [facility] for tasks assessed/performed;Flat affect             Past Medical History:  Diagnosis Date   Anemia    Atrial fibrillation (HCC)    Atypical chest pain 10/01/2020   Dizziness    Hypertension    Low back pain     Past Surgical History:  Procedure Laterality Date   CARDIOVERSION N/A 05/10/2019   Procedure: CARDIOVERSION;  Surgeon: Lewayne Bunting, MD;  Location: [facility] ENDOSCOPY;  Service: Cardiovascular;  Laterality: N/A;   COLONOSCOPY  07/12/2013   LEFT HEART CATH AND CORONARY ANGIOGRAPHY N/A 08/30/2019   Procedure: LEFT HEART CATH AND CORONARY ANGIOGRAPHY;  Surgeon: Corky Crafts, MD;  Location: [facility] INVASIVE CV LAB;  Service: Cardiovascular;  Laterality: N/A;   TEE WITHOUT CARDIOVERSION N/A 05/10/2019   Procedure: TRANSESOPHAGEAL ECHOCARDIOGRAM (TEE);  Surgeon: Lewayne Bunting, MD;  Location: [facility] ENDOSCOPY;  Service: Cardiovascular;  Laterality: N/A;    There were no vitals filed for this visit.   Subjective Assessment - 12/10/20 1327     Subjective Pain in the right hip and the right shoulder. He reports the hip pain worsened after the estim last visit.    Pertinent History HTN, A-Fib, Anemia, LBP    Diagnostic tests EMG nerve conduction study showed no significant abnormality, in specific, there is no evidence of bilateral lumbosacral radiculopathy, or right cervical radiculopathy.MRI of cervical spine showed multilevel  degenerative changes, most prominent lower cervical region, C5-6, C6-T1, with mild canal stenosis, no cord signal abnormality, variable  degree of foraminal narrowing. MRI lumbar spine showed multilevel degenerative changes, no evidence of spinal canal or nerve root compression    Patient Stated Goals improve pain    Currently in Pain? Yes    Pain Score 7     Pain Location Hip    Pain Orientation Right    Pain Descriptors / Indicators Burning                               [facility] Adult PT Treatment/Exercise - 12/10/20 0001       Lumbar Exercises: Stretches   Standing Side Bend Right;2 reps;30 seconds    Standing Side Bend Limitations right leg behind left    Standing Extension 10 reps;10 seconds    Standing Extension Limitations with elbows on wall    Press Ups 2 reps    Press Ups Limitations hard on right shoulder      Lumbar Exercises: Aerobic   Nustep L3 x 5 min      Manual Therapy   Manual Therapy Soft tissue mobilization;Joint mobilization;Myofascial release    Manual therapy comments Skilled palpation and monitoring of soft tissues during DN    Joint Mobilization UPA mobs gd III/IV to right lumbar in prone and POE    Soft tissue mobilization to right  lumbar and right gluteals    Myofascial Release TPR to right QL and gluteals in SDLY; to right psoas in hooklying with active hip flexion x 10              Trigger Point Dry Needling - 12/10/20 0001     Consent Given? Yes    Education Handout Provided --   went over h/o verbally through interpreter   Muscles Treated Back/Hip Quadratus lumborum    Dry Needling Comments right    Quadratus Lumborum Response Palpable increased muscle length                   PT Education - 12/10/20 1421     Education Details HEP; education on DN and aftercare verbally given through interpreter; consent given through interpreter    Person(s) Educated Patient;Other (comment)   interpreter   Methods Explanation     Comprehension Verbalized understanding              PT Short Term Goals - 10/02/20 1717       PT SHORT TERM GOAL #1   Title Patient will be independent with initial HEP for strength/stretching/ROM (ALL STGs: at 4th Visit)    Baseline no HEP established    Time 4    Period Weeks    Status On-going      PT SHORT TERM GOAL #2   Title to be assessed and LTG to be set as applicable    Baseline TBA    Time 4    Period Weeks    Status Achieved               PT Long Term Goals - 12/02/20 1518       PT LONG TERM GOAL #1   Title Patient will be independent with final HEP for strength/balance/stretching and daily walking program (ALL LTGs Due: at 9th visit)    Status Achieved      PT LONG TERM GOAL #2   Title Pt will be able to tolerate ambulating at least 595' during 6 MWT with rollator to demo MDIC and improved community mobility    Baseline 395' with rollator    Status On-going      PT LONG TERM GOAL #3   Title Pt will have improved Oswestry Low Back Disability score by at least 11 points to demonstrate MDC    Baseline 38/50    Status On-going      PT LONG TERM GOAL #4   Title Patient will improve cervical ROM by 8 degrees in all direction to demonstrate improved ability to scan environment    Status Achieved      PT LONG TERM GOAL #5   Title Patient will be able to ambulate x 100 ft with LRAD vs. No AD and Mod I for improved household mobility                   Plan - 12/10/20 1544     Clinical Impression Statement Patient reporting same levels of pain as previous visit although he did say the estim made it worse once he got home. We did an initial trial of DN to the right QL today in addition to other manual work with good results. He still had pain in the right psoas but demonstrated improved lumbar extension at end of session. Two stretches issued and pt encouraged to stand up every hour to do lumbar extension at wall. He would likely benefit  from  further DN to psoas, gluteals and multifidi if he could tolerate it. He only wanted to do QL today. LTGs are ongoing.    PT Frequency 1x / week    PT Duration 8 weeks    PT Treatment/Interventions ADLs/Self Care Home Management;Aquatic Therapy;Cryotherapy;Electrical Stimulation;Iontophoresis 4mg /ml Dexamethasone;Moist Heat;DME Instruction;Stair training;Functional mobility training;Therapeutic activities;Gait training;Therapeutic exercise;Balance training;Neuromuscular re-education;Patient/family education;Orthotic Fit/Training;Manual techniques;Passive range of motion;Dry needling;Spinal Manipulations;Joint Manipulations    PT Next Visit Plan Assess DN and mobs. Is pt doing new HEP? Continue as indicated.  Work on core strengthening as pt is able to tolerate. Work on pelvic/low back AROM. More functiona gait    PT Home Exercise Plan Access Code             Patient will benefit from skilled therapeutic intervention in order to improve the following deficits and impairments:  Abnormal gait, Decreased balance, Difficulty walking, Increased muscle spasms, Impaired sensation, Pain, Postural dysfunction, Impaired flexibility, Decreased strength, Decreased knowledge of use of DME, Decreased activity tolerance, Decreased coordination, Decreased range of motion, Decreased endurance  Visit Diagnosis: Pain in right hip  Muscle weakness (generalized)  Chronic right-sided low back pain, unspecified whether sciatica present  Abnormal posture     Problem List Patient Active Problem List   Diagnosis Date Noted   Atypical chest pain 10/01/2020   Chronic kidney disease 09/18/2020   MDD (major depressive disorder), recurrent, in partial remission (HCC) 09/11/2020   Gait abnormality 07/29/2020   Neck pain 07/29/2020   Urinary incontinence 07/29/2020   Right hip pain 07/29/2020   MDD (major depressive disorder), recurrent episode, moderate (HCC) 04/15/2020   Persistent atrial  fibrillation (HCC) 09/20/2019   Secondary hypercoagulable state (HCC) 09/20/2019   Abnormal nuclear stress test    Spinal stenosis of cervical region 08/13/2019   Protrusion of cervical intervertebral disc 08/13/2019   Shoulder pain    Chronic combined systolic and diastolic heart failure (HCC)    Atrial fibrillation with RVR (HCC) 05/07/2019   Hypothyroidism 05/07/2019   Hypomagnesemia 05/07/2019   Elevated troponin 05/07/2019   Anticoagulated 03/12/2019   Dry skin dermatitis 03/12/2019   Muscle spasm of right shoulder 03/12/2019   Peripheral edema 03/12/2019   Shifting sleep-work schedule, affecting sleep 03/31/2018   Tobacco abuse 03/31/2018   Balanitis 03/20/2018   Hx of gonorrhea 11/23/2017   Anemia 06/27/2015   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   H/O colonoscopy 07/12/2013   Atopic dermatitis 04/18/2013   Venous insufficiency of both lower extremities 04/12/2013   Low back pain 04/12/2013   04/14/2013, PT 12/10/2020, 3:52 PM  [facility] [address] [place], [place], [place] Phone: [phone]   Fax:  [phone]  Name: Moyses Jerod Mcquain MRN: Lucious Groves Date of Birth: [birth year]

## 2020-12-10 NOTE — Patient Instructions (Signed)
Access Code: [email or web address] URL: [email or web address] Date: 12/10/2020 Prepared by: Raynelle Fanning  Exercises Supine Double Knee to Chest - 1 x daily - 7 x weekly - 1 sets - 20 sec hold Seated Pelvic Tilt - 2 x daily - 7 x weekly - 1 sets - 10 reps Seated Abdominal Press into [employer] - 2 x daily - 7 x weekly - 1 sets - 10 reps Standing Lumbar Extension at Wall - Forearms - 4-5 x daily - 7 x weekly - 2 sets - 10 reps Standing Quadratus Lumborum Stretch with Doorway (Mirrored) - 2 x daily - 7 x weekly - 1 sets - 2 reps - 60 sec hold

## 2020-12-14 ENCOUNTER — Other Ambulatory Visit: Payer: Self-pay | Admitting: Cardiovascular Disease

## 2020-12-15 NOTE — Telephone Encounter (Signed)
Rx(s) sent to pharmacy electronically.  

## 2020-12-16 ENCOUNTER — Ambulatory Visit: Payer: Medicaid Other | Admitting: Physical Therapy

## 2020-12-16 ENCOUNTER — Encounter: Payer: Self-pay | Admitting: Physical Therapy

## 2020-12-16 ENCOUNTER — Other Ambulatory Visit: Payer: Self-pay

## 2020-12-16 DIAGNOSIS — M6281 Muscle weakness (generalized): Secondary | ICD-10-CM

## 2020-12-16 DIAGNOSIS — R293 Abnormal posture: Secondary | ICD-10-CM

## 2020-12-16 DIAGNOSIS — M25551 Pain in right hip: Secondary | ICD-10-CM

## 2020-12-16 DIAGNOSIS — M545 Low back pain, unspecified: Secondary | ICD-10-CM

## 2020-12-16 NOTE — Therapy (Signed)
[facility] [address] [place], [place], [address] Phone: [phone]   Fax:  [phone]  Physical Therapy Treatment  Patient Details  Name: Seth Snyder MRN: [id number] Date of Birth: 17-Apr-1960 Referring Provider (PT): Levert Feinstein, MD   Encounter Date: 12/16/2020   PT End of Session - 12/16/20 1510     Visit Number 14    Authorization Type [employer] (VL: 27)    Authorization Time Period no authorization required    PT Start Time 1430    PT Stop Time 1515    PT Time Calculation (min) 45 min    Activity Tolerance Patient limited by pain    Behavior During Therapy [facility] for tasks assessed/performed;Flat affect             Past Medical History:  Diagnosis Date   Anemia    Atrial fibrillation (HCC)    Atypical chest pain 10/01/2020   Dizziness    Hypertension    Low back pain     Past Surgical History:  Procedure Laterality Date   CARDIOVERSION N/A 05/10/2019   Procedure: CARDIOVERSION;  Surgeon: Lewayne Bunting, MD;  Location: [facility] ENDOSCOPY;  Service: Cardiovascular;  Laterality: N/A;   COLONOSCOPY  07/12/2013   LEFT HEART CATH AND CORONARY ANGIOGRAPHY N/A 08/30/2019   Procedure: LEFT HEART CATH AND CORONARY ANGIOGRAPHY;  Surgeon: Corky Crafts, MD;  Location: [facility] INVASIVE CV LAB;  Service: Cardiovascular;  Laterality: N/A;   TEE WITHOUT CARDIOVERSION N/A 05/10/2019   Procedure: TRANSESOPHAGEAL ECHOCARDIOGRAM (TEE);  Surgeon: Lewayne Bunting, MD;  Location: [facility] ENDOSCOPY;  Service: Cardiovascular;  Laterality: N/A;    There were no vitals filed for this visit.   Subjective Assessment - 12/16/20 1432     Subjective "Same thing" Pain in the anterior R hip, R low back , and R shoulder    Currently in Pain? Yes    Pain Score 8     Pain Location Back                               [facility] Adult PT Treatment/Exercise - 12/16/20 0001       Lumbar Exercises: Aerobic   Nustep  L3 x 6 min      Lumbar Exercises: Machines for Strengthening   Cybex Knee Extension 5lb x10, x5    Cybex Knee Flexion 20lb 2x10      Lumbar Exercises: Standing   Row Theraband;20 reps;Both;Strengthening    Theraband Level (Row) Level 3 (Green)    Other Standing Lumbar Exercises 6'' step up x10 each, ball squeezes 2x10    Other Standing Lumbar Exercises resisted side step x3 each                       PT Short Term Goals - 10/02/20 1717       PT SHORT TERM GOAL #1   Title Patient will be independent with initial HEP for strength/stretching/ROM (ALL STGs: at 4th Visit)    Baseline no HEP established    Time 4    Period Weeks    Status On-going      PT SHORT TERM GOAL #2   Title to be assessed and LTG to be set as applicable    Baseline TBA    Time 4    Period Weeks    Status Achieved  PT Long Term Goals - 12/16/20 1510       PT LONG TERM GOAL #2   Title Pt will be able to tolerate ambulating at least 595' during 6 MWT with rollator to demo MDIC and improved community mobility    Status On-going                   Plan - 12/16/20 1511     Clinical Impression Statement Pt enters clinic reporting the same pain in the anterior R hip, R low back, and R shoulder. He reports that nothing give him any relief. Pt doe complete the interventions but a really slow pace. Cue needed to fully extend RLE needed with step ups. Tactile cue for posture needed with standing rows and extensions. Cue for full ROM needed with seated curls and extensions    Personal Factors and Comorbidities Comorbidity 3+;Time since onset of injury/illness/exacerbation    Comorbidities HTN, A-Fib, Anemia, LBP    Examination-Activity Limitations Bed Mobility;Transfers;Stairs;Stand;Locomotion Level;Squat    Examination-Participation Restrictions Community Activity;Yard Work;Occupation    Stability/Clinical Decision Making Stable/Uncomplicated    Rehab Potential Good     PT Frequency 1x / week    PT Duration 8 weeks    PT Treatment/Interventions ADLs/Self Care Home Management;Aquatic Therapy;Cryotherapy;Electrical Stimulation;Iontophoresis 4mg /ml Dexamethasone;Moist Heat;DME Instruction;Stair training;Functional mobility training;Therapeutic activities;Gait training;Therapeutic exercise;Balance training;Neuromuscular re-education;Patient/family education;Orthotic Fit/Training;Manual techniques;Passive range of motion;Dry needling;Spinal Manipulations;Joint Manipulations    PT Next Visit Plan Is pt doing new HEP? Continue as indicated.  Work on core strengthening as pt is able to tolerate. Work on pelvic/low back AROM. More functiona gait             Patient will benefit from skilled therapeutic intervention in order to improve the following deficits and impairments:  Abnormal gait, Decreased balance, Difficulty walking, Increased muscle spasms, Impaired sensation, Pain, Postural dysfunction, Impaired flexibility, Decreased strength, Decreased knowledge of use of DME, Decreased activity tolerance, Decreased coordination, Decreased range of motion, Decreased endurance  Visit Diagnosis: Muscle weakness (generalized)  Pain in right hip  Chronic right-sided low back pain, unspecified whether sciatica present  Abnormal posture     Problem List Patient Active Problem List   Diagnosis Date Noted   Atypical chest pain 10/01/2020   Chronic kidney disease 09/18/2020   MDD (major depressive disorder), recurrent, in partial remission (HCC) 09/11/2020   Gait abnormality 07/29/2020   Neck pain 07/29/2020   Urinary incontinence 07/29/2020   Right hip pain 07/29/2020   MDD (major depressive disorder), recurrent episode, moderate (HCC) 04/15/2020   Persistent atrial fibrillation (HCC) 09/20/2019   Secondary hypercoagulable state (HCC) 09/20/2019   Abnormal nuclear stress test    Spinal stenosis of cervical region 08/13/2019   Protrusion of cervical  intervertebral disc 08/13/2019   Shoulder pain    Chronic combined systolic and diastolic heart failure (HCC)    Atrial fibrillation with RVR (HCC) 05/07/2019   Hypothyroidism 05/07/2019   Hypomagnesemia 05/07/2019   Elevated troponin 05/07/2019   Anticoagulated 03/12/2019   Dry skin dermatitis 03/12/2019   Muscle spasm of right shoulder 03/12/2019   Peripheral edema 03/12/2019   Shifting sleep-work schedule, affecting sleep 03/31/2018   Tobacco abuse 03/31/2018   Balanitis 03/20/2018   Hx of gonorrhea 11/23/2017   Anemia 06/27/2015   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 [place], [place], [address] Phone: [phone]   Fax:  [phone]  Name: Ishaaq Manvir Prabhu MRN: [id number] Date of Birth: 1960/10/10

## 2020-12-23 ENCOUNTER — Encounter: Payer: Self-pay | Admitting: Physical Therapy

## 2020-12-23 ENCOUNTER — Other Ambulatory Visit: Payer: Self-pay

## 2020-12-23 ENCOUNTER — Ambulatory Visit: Payer: Medicaid Other | Attending: Neurology | Admitting: Physical Therapy

## 2020-12-23 DIAGNOSIS — R2689 Other abnormalities of gait and mobility: Secondary | ICD-10-CM | POA: Diagnosis present

## 2020-12-23 DIAGNOSIS — G8929 Other chronic pain: Secondary | ICD-10-CM | POA: Diagnosis present

## 2020-12-23 DIAGNOSIS — M6281 Muscle weakness (generalized): Secondary | ICD-10-CM | POA: Insufficient documentation

## 2020-12-23 DIAGNOSIS — M545 Low back pain, unspecified: Secondary | ICD-10-CM | POA: Diagnosis present

## 2020-12-23 DIAGNOSIS — M542 Cervicalgia: Secondary | ICD-10-CM | POA: Insufficient documentation

## 2020-12-23 DIAGNOSIS — R293 Abnormal posture: Secondary | ICD-10-CM | POA: Insufficient documentation

## 2020-12-23 DIAGNOSIS — M25551 Pain in right hip: Secondary | ICD-10-CM | POA: Diagnosis present

## 2020-12-23 NOTE — Therapy (Signed)
[facility] [address] [place], [place], [address] Phone: [phone]   Fax:  [phone]  Physical Therapy Treatment  Patient Details  Name: Seth Snyder MRN: [id number] Date of Birth: 16-Jul-1960 Referring Provider (PT): Levert Feinstein, MD   Encounter Date: 12/23/2020   PT End of Session - 12/23/20 1438     Visit Number 15    Authorization Type [employer] (VL: 27)    Authorization Time Period no authorization required    PT Start Time 1353    PT Stop Time 1444    PT Time Calculation (min) 51 min    Activity Tolerance Patient limited by pain;Patient tolerated treatment well    Behavior During Therapy [facility] for tasks assessed/performed;Flat affect             Past Medical History:  Diagnosis Date   Anemia    Atrial fibrillation (HCC)    Atypical chest pain 10/01/2020   Dizziness    Hypertension    Low back pain     Past Surgical History:  Procedure Laterality Date   CARDIOVERSION N/A 05/10/2019   Procedure: CARDIOVERSION;  Surgeon: Lewayne Bunting, MD;  Location: [facility] ENDOSCOPY;  Service: Cardiovascular;  Laterality: N/A;   COLONOSCOPY  07/12/2013   LEFT HEART CATH AND CORONARY ANGIOGRAPHY N/A 08/30/2019   Procedure: LEFT HEART CATH AND CORONARY ANGIOGRAPHY;  Surgeon: Corky Crafts, MD;  Location: [facility] INVASIVE CV LAB;  Service: Cardiovascular;  Laterality: N/A;   TEE WITHOUT CARDIOVERSION N/A 05/10/2019   Procedure: TRANSESOPHAGEAL ECHOCARDIOGRAM (TEE);  Surgeon: Lewayne Bunting, MD;  Location: [facility] ENDOSCOPY;  Service: Cardiovascular;  Laterality: N/A;    There were no vitals filed for this visit.   Subjective Assessment - 12/23/20 1357     Subjective The R hip and LB is hurting. Might have an appointment with his MD in November. Previously did get benefit from the shot in his hip.    Patient is accompained by: Interpreter    Pertinent History HTN, A-Fib, Anemia, LBP    Diagnostic tests EMG nerve  conduction study showed no significant abnormality, in specific, there is no evidence of bilateral lumbosacral radiculopathy, or right cervical radiculopathy.MRI of cervical spine showed multilevel degenerative changes, most prominent lower cervical region, C5-6, C6-T1, with mild canal stenosis, no cord signal abnormality, variable  degree of foraminal narrowing. MRI lumbar spine showed multilevel degenerative changes, no evidence of spinal canal or nerve root compression    Patient Stated Goals improve pain    Currently in Pain? Yes    Pain Score 8     Pain Location Back    Pain Orientation Right    Pain Descriptors / Indicators Burning    Pain Type Chronic pain                               [facility] Adult PT Treatment/Exercise - 12/23/20 0001       Lumbar Exercises: Stretches   Hip Flexor Stretch Right;2 reps;30 seconds    Hip Flexor Stretch Limitations supine with strap in mod thomas    ITB Stretch Right;30 seconds    ITB Stretch Limitations supine with strap      Lumbar Exercises: Aerobic   Nustep L5 x 6 min      Lumbar Exercises: Seated   Other Seated Lumbar Exercises L diagonal prayer stretch 10x3"   encouraged to stretch further for max stretch within tolerance  Moist Heat Therapy   Number Minutes Moist Heat 10 Minutes    Moist Heat Location --   R hip/LB     Manual Therapy   Manual Therapy Soft tissue mobilization;Myofascial release    Soft tissue mobilization STM to R iliacus and psoas, TFL, QL, and lumbar paraspinals    Myofascial Release manual TPR to R iliacus and psoas, TFL, QL, and lumbar paraspinals   very tight in ilipsoas insertion, QL, paraspinals and TTP throughout                      PT Short Term Goals - 10/02/20 1717       PT SHORT TERM GOAL #1   Title Patient will be independent with initial HEP for strength/stretching/ROM (ALL STGs: at 4th Visit)    Baseline no HEP established    Time 4    Period Weeks    Status  On-going      PT SHORT TERM GOAL #2   Title to be assessed and LTG to be set as applicable    Baseline TBA    Time 4    Period Weeks    Status Achieved               PT Long Term Goals - 12/16/20 1510       PT LONG TERM GOAL #2   Title Pt will be able to tolerate ambulating at least 595' during 6 MWT with rollator to demo MDIC and improved community mobility    Status On-going                   Plan - 12/23/20 1438     Clinical Impression Statement Patient arrived to session with report of continued R hip and LBP. Notes some improvement from R hip injection 2 weeks ago, however pain is still severe today. Patient tolerated STM and manual TPR to R  hip and LB musculature, which was particularly tight in the R ilipsoas insertion, QL, and paraspinals and noted TTP throughout. Patient reported no change after MT. Patient performed gentle hip and QL stretching to address areas of pain and tightness. Encouraged patient to find ROM that allows stretch sensation without pushing into pain. Updated HEP with these stretches- patient reported understanding. Ended session with moist heat to R hip/LB for continued pain relief. No complaints at end of session.    Personal Factors and Comorbidities Comorbidity 3+;Time since onset of injury/illness/exacerbation    Comorbidities HTN, A-Fib, Anemia, LBP    Examination-Activity Limitations Bed Mobility;Transfers;Stairs;Stand;Locomotion Level;Squat    Examination-Participation Restrictions Community Activity;Yard Work;Occupation    Stability/Clinical Decision Making Stable/Uncomplicated    Rehab Potential Good    PT Frequency 1x / week    PT Duration 8 weeks    PT Treatment/Interventions ADLs/Self Care Home Management;Aquatic Therapy;Cryotherapy;Electrical Stimulation;Iontophoresis 4mg /ml Dexamethasone;Moist Heat;DME Instruction;Stair training;Functional mobility training;Therapeutic activities;Gait training;Therapeutic exercise;Balance  training;Neuromuscular re-education;Patient/family education;Orthotic Fit/Training;Manual techniques;Passive range of motion;Dry needling;Spinal Manipulations;Joint Manipulations    PT Next Visit Plan Is pt doing new HEP? Continue as indicated.  Work on core strengthening as pt is able to tolerate. Work on pelvic/low back AROM. More functiona gait    Consulted and Agree with Plan of Care Patient             Patient will benefit from skilled therapeutic intervention in order to improve the following deficits and impairments:  Abnormal gait, Decreased balance, Difficulty walking, Increased muscle spasms, Impaired sensation, Pain, Postural dysfunction, Impaired flexibility,  Decreased strength, Decreased knowledge of use of DME, Decreased activity tolerance, Decreased coordination, Decreased range of motion, Decreased endurance  Visit Diagnosis: Muscle weakness (generalized)  Pain in right hip  Chronic right-sided low back pain, unspecified whether sciatica present  Abnormal posture  Cervicalgia  Other abnormalities of gait and mobility     Problem List Patient Active Problem List   Diagnosis Date Noted   Atypical chest pain 10/01/2020   Chronic kidney disease 09/18/2020   MDD (major depressive disorder), recurrent, in partial remission (HCC) 09/11/2020   Gait abnormality 07/29/2020   Neck pain 07/29/2020   Urinary incontinence 07/29/2020   Right hip pain 07/29/2020   MDD (major depressive disorder), recurrent episode, moderate (HCC) 04/15/2020   Persistent atrial fibrillation (HCC) 09/20/2019   Secondary hypercoagulable state (HCC) 09/20/2019   Abnormal nuclear stress test    Spinal stenosis of cervical region 08/13/2019   Protrusion of cervical intervertebral disc 08/13/2019   Shoulder pain    Chronic combined systolic and diastolic heart failure (HCC)    Atrial fibrillation with RVR (HCC) 05/07/2019   Hypothyroidism 05/07/2019   Hypomagnesemia 05/07/2019   Elevated  troponin 05/07/2019   Anticoagulated 03/12/2019   Dry skin dermatitis 03/12/2019   Muscle spasm of right shoulder 03/12/2019   Peripheral edema 03/12/2019   Shifting sleep-work schedule, affecting sleep 03/31/2018   Tobacco abuse 03/31/2018   Balanitis 03/20/2018   Hx of gonorrhea 11/23/2017   Anemia 06/27/2015   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   H/O colonoscopy 07/12/2013   Atopic dermatitis 04/18/2013   Venous insufficiency of both lower extremities 04/12/2013   Low back pain 04/12/2013    Anette Guarneri, PT, DPT 12/23/20 2:47 PM   [facility] [address] [place], [place], [address] Phone: [phone]   Fax:  [phone]  Name: Seth Snyder MRN: [id number] Date of Birth: 1960/03/23

## 2020-12-30 ENCOUNTER — Other Ambulatory Visit: Payer: Self-pay

## 2020-12-30 ENCOUNTER — Ambulatory Visit: Payer: Medicaid Other | Admitting: Physical Therapy

## 2020-12-30 DIAGNOSIS — M6281 Muscle weakness (generalized): Secondary | ICD-10-CM | POA: Diagnosis not present

## 2020-12-30 DIAGNOSIS — M545 Low back pain, unspecified: Secondary | ICD-10-CM

## 2020-12-30 DIAGNOSIS — M25551 Pain in right hip: Secondary | ICD-10-CM

## 2020-12-30 NOTE — Therapy (Signed)
[address] [place], [place], [address] Phone: [phone]   Fax:  [phone]  Physical Therapy Treatment  Patient Details  Name: Seth Snyder MRN: [id number] Date of Birth: 28-May-1960 Referring Provider (PT): Marcial Pacas, MD   Encounter Date: 12/30/2020   PT End of Session - 12/30/20 1425     Visit Number 16    Authorization Type [employer] (VL: 27)    Authorization Time Period no authorization required    PT Start Time 1350    PT Stop Time 1433    PT Time Calculation (min) 43 min             Past Medical History:  Diagnosis Date   Anemia    Atrial fibrillation ([place])    Atypical chest pain 10/01/2020   Dizziness    Hypertension    Low back pain     Past Surgical History:  Procedure Laterality Date   CARDIOVERSION N/A 05/10/2019   Procedure: CARDIOVERSION;  Surgeon: Lelon Perla, MD;  Location: [facility];  Service: Cardiovascular;  Laterality: N/A;   COLONOSCOPY  07/12/2013   LEFT HEART CATH AND CORONARY ANGIOGRAPHY N/A 08/30/2019   Procedure: LEFT HEART CATH AND CORONARY ANGIOGRAPHY;  Surgeon: Jettie Booze, MD;  Location: [facility] CV LAB;  Service: Cardiovascular;  Laterality: N/A;   TEE WITHOUT CARDIOVERSION N/A 05/10/2019   Procedure: TRANSESOPHAGEAL ECHOCARDIOGRAM (TEE);  Surgeon: Lelon Perla, MD;  Location: [facility] ENDOSCOPY;  Service: Cardiovascular;  Laterality: N/A;    There were no vitals filed for this visit.   Subjective Assessment - 12/30/20 1357     Subjective pt arrived amb slowly with 4WW. c/o RT side pain ( ant hip,back and shld) NE with PT and massage made it worse last time. No changes with any PT per pt.Pt states he wants to finish reaining 2 appts and then return to MD in Nov.    Patient is accompained by: Interpreter    Currently in Pain? Yes    Pain Score 8                 [facility] PT Assessment - 12/30/20 0001       AROM   Cervical  Flexion WFLs    Cervical Extension 28    Cervical - Right Rotation 20    Cervical - Left Rotation 20                           [facility] Adult PT Treatment/Exercise - 12/30/20 0001       Ambulation/Gait   Gait Comments 6MWT performed   4 min with 4WW 1 standing rest. stopped d/t pain and lightheadness     Lumbar Exercises: Aerobic   Nustep L5 x 6 min      Lumbar Exercises: Machines for Strengthening   Cybex Knee Extension 5# 2 sets 10   cued for full ROM   Cybex Knee Flexion 20lb 2x10   cued for full ROM     Lumbar Exercises: Standing   Row Theraband;Both;Strengthening;15 reps    Theraband Level (Row) Level 3 (Green)    Shoulder Extension Strengthening;Both;15 reps    Theraband Level (Shoulder Extension) Level 3 (Green)      Lumbar Exercises: Seated   Sit to Stand 10 reps   3# wt chest press  PT Short Term Goals - 12/30/20 1400       PT SHORT TERM GOAL #1   Title Patient will be independent with initial HEP for strength/stretching/ROM (ALL STGs: at 4th Visit)    Status Achieved               PT Long Term Goals - 12/30/20 1400       PT LONG TERM GOAL #1   Title Patient will be independent with final HEP for strength/balance/stretching and daily walking program (ALL LTGs Due: at 9th visit)    Status Partially Met      PT LONG TERM GOAL #2   Title Pt will be able to tolerate ambulating at least 595' during 6 MWT with rollator to demo MDIC and improved community mobility    Baseline 375  feet with rollator 1 standing rest and c/o increased pain  and some lightheadness. 4 min total walk time    Status On-going      PT LONG TERM GOAL #3   Title Pt will have improved Oswestry Low Back Disability score by at least 11 points to demonstrate [place]    Status On-going      PT LONG TERM GOAL #4   Title Patient will improve cervical ROM by 8 degrees in all direction to demonstrate improved ability to scan environment     Baseline limited by pain    Status On-going      PT LONG TERM GOAL #5   Title Patient will be able to ambulate x 100 ft with LRAD vs. No AD and Mod I for improved household mobility    Status Achieved                   Plan - 12/30/20 1419     Clinical Impression Statement pt very slow with all mvmt and multi c/o pain throughout session with compensations ( decreased weight in stance phase on RT LE and hikes RT shld with mvmt) pt verb PT has not helped and STW/stretch increased pain. slow progression with goals. pt states he wants to finish last 2 sessions and then has MD f/u    PT Treatment/Interventions ADLs/Self Care Home Management;Aquatic Therapy;Cryotherapy;Electrical Stimulation;Iontophoresis 28m/ml Dexamethasone;Moist Heat;DME Instruction;Stair training;Functional mobility training;Therapeutic activities;Gait training;Therapeutic exercise;Balance training;Neuromuscular re-education;Patient/family education;Orthotic Fit/Training;Manual techniques;Passive range of motion;Dry needling;Spinal Manipulations;Joint Manipulations    PT Next Visit Plan pt verb doing HEP reassess and adjust if needed             Patient will benefit from skilled therapeutic intervention in order to improve the following deficits and impairments:  Abnormal gait, Decreased balance, Difficulty walking, Increased muscle spasms, Impaired sensation, Pain, Postural dysfunction, Impaired flexibility, Decreased strength, Decreased knowledge of use of DME, Decreased activity tolerance, Decreased coordination, Decreased range of motion, Decreased endurance  Visit Diagnosis: Muscle weakness (generalized)  Pain in right hip  Chronic right-sided low back pain, unspecified whether sciatica present     Problem List Patient Active Problem List   Diagnosis Date Noted   Atypical chest pain 10/01/2020   Chronic kidney disease 09/18/2020   MDD (major depressive disorder), recurrent, in partial remission ([place]  09/11/2020   Gait abnormality 07/29/2020   Neck pain 07/29/2020   Urinary incontinence 07/29/2020   Right hip pain 07/29/2020   MDD (major depressive disorder), recurrent episode, moderate ([facility] 04/15/2020   Persistent atrial fibrillation ([place] 09/20/2019   Secondary hypercoagulable state ([place] 09/20/2019   Abnormal nuclear stress test    Spinal stenosis of  cervical region 08/13/2019   Protrusion of cervical intervertebral disc 08/13/2019   Shoulder pain    Chronic combined systolic and diastolic heart failure (HCC)    Atrial fibrillation with RVR ([place]) 05/07/2019   Hypothyroidism 05/07/2019   Hypomagnesemia 05/07/2019   Elevated troponin 05/07/2019   Anticoagulated 03/12/2019   Dry skin dermatitis 03/12/2019   Muscle spasm of right shoulder 03/12/2019   Peripheral edema 03/12/2019   Shifting sleep-work schedule, affecting sleep 03/31/2018   Tobacco abuse 03/31/2018   Balanitis 03/20/2018   Hx of gonorrhea 11/23/2017   Anemia 06/27/2015   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   H/O colonoscopy 07/12/2013   Atopic dermatitis 04/18/2013   Venous insufficiency of both lower extremities 04/12/2013   Low back pain 04/12/2013    Fareedah Mahler,ANGIE, PTA 12/30/2020, 2:26 PM  [address] [place], [place], [address] Phone: [phone]   Fax:  [phone]  Name: Danie Raffaele Derise MRN: [id number] Date of Birth: 1960/07/03

## 2020-12-31 ENCOUNTER — Other Ambulatory Visit: Payer: Medicaid Other

## 2020-12-31 DIAGNOSIS — E039 Hypothyroidism, unspecified: Secondary | ICD-10-CM

## 2020-12-31 NOTE — Progress Notes (Signed)
   [facility] [address] [place], [place]  [address] Phone:  [phone]   Fax:  [phone] 

## 2021-01-01 ENCOUNTER — Other Ambulatory Visit: Payer: Self-pay

## 2021-01-01 ENCOUNTER — Encounter: Payer: Self-pay | Admitting: Nurse Practitioner

## 2021-01-01 ENCOUNTER — Other Ambulatory Visit: Payer: Self-pay | Admitting: Nurse Practitioner

## 2021-01-01 ENCOUNTER — Ambulatory Visit (INDEPENDENT_AMBULATORY_CARE_PROVIDER_SITE_OTHER): Payer: Medicaid Other | Admitting: Nurse Practitioner

## 2021-01-01 VITALS — BP 125/83 | HR 60 | Temp 97.2°F | Ht 67.0 in | Wt 155.2 lb

## 2021-01-01 DIAGNOSIS — K4090 Unilateral inguinal hernia, without obstruction or gangrene, not specified as recurrent: Secondary | ICD-10-CM

## 2021-01-01 DIAGNOSIS — R1031 Right lower quadrant pain: Secondary | ICD-10-CM | POA: Diagnosis not present

## 2021-01-01 DIAGNOSIS — R1011 Right upper quadrant pain: Secondary | ICD-10-CM | POA: Diagnosis not present

## 2021-01-01 DIAGNOSIS — E039 Hypothyroidism, unspecified: Secondary | ICD-10-CM

## 2021-01-01 DIAGNOSIS — R21 Rash and other nonspecific skin eruption: Secondary | ICD-10-CM

## 2021-01-01 DIAGNOSIS — F331 Major depressive disorder, recurrent, moderate: Secondary | ICD-10-CM

## 2021-01-01 LAB — T3: T3, Total: 115 ng/dL (ref 71–180)

## 2021-01-01 LAB — TSH: TSH: 10.4 u[IU]/mL — ABNORMAL HIGH (ref 0.450–4.500)

## 2021-01-01 LAB — T4, FREE: Free T4: 1.05 ng/dL (ref 0.82–1.77)

## 2021-01-01 MED ORDER — LEVOTHYROXINE SODIUM 50 MCG PO TABS: 50.0000 ug | ORAL_TABLET | Freq: Every day | ORAL | 3 refills | Status: DC

## 2021-01-01 MED ORDER — TRIAMCINOLONE ACETONIDE 0.5 % EX OINT: 1.0000 "application " | TOPICAL_OINTMENT | Freq: Two times a day (BID) | CUTANEOUS | 0 refills | Status: DC

## 2021-01-01 NOTE — Progress Notes (Signed)
[address] [place], [place]  [address] Phone:  [phone]   Fax:  [phone]   Established Patient Office Visit  Subjective:  Patient ID: Seth Snyder, male    DOB: November 03, 1960  Age: 60 y.o. MRN: [id number]  CC:  Chief Complaint  Patient presents with   Back Pain    Pt is here today for lower back and right hip pains. Pt states that he has been doing physical therapy but it is not helping and feels the pains are worsening. Pt has been using a walker for steady mobility.    HPI Seth Snyder presents for follow up. He  has a past medical history of Anemia, Atrial fibrillation ([place]), Atypical chest pain (10/01/2020), Dizziness, Hypertension, and Low back pain.   He is in for follow up. Interpreter Mira [phone] assisting.   He has had pain since 2011. He is currently currently in PT and following up with orthopedics.    He complains of stiffness and pain all over his body. He has burning sensation to his lower back. He is having right lower abdominal pain. He has found a lump. This has been there for a while. He feels like this lump has grown. He reports that the area was massaged into physical therapy but this caused more pain. He reports that TENS therapy and massage has not been effective.  He has 2 additional sessions which he will complete.  He will follow-up with orthopedist in 4 weeks.  He reports that reconstructive surgery was not needed.  He has a itching area to his right ankle. He denies any additional areas of concern. Past Medical History:  Diagnosis Date   Anemia    Atrial fibrillation ([place])    Atypical chest pain 10/01/2020   Dizziness    Hypertension    Low back pain     Past Surgical History:  Procedure Laterality Date   CARDIOVERSION N/A 05/10/2019   Procedure: CARDIOVERSION;  Surgeon: Lelon Perla, MD;  Location: [facility] ENDOSCOPY;  Service: Cardiovascular;  Laterality: N/A;   COLONOSCOPY  07/12/2013   LEFT HEART  CATH AND CORONARY ANGIOGRAPHY N/A 08/30/2019   Procedure: LEFT HEART CATH AND CORONARY ANGIOGRAPHY;  Surgeon: Jettie Booze, MD;  Location: [facility] CV LAB;  Service: Cardiovascular;  Laterality: N/A;   TEE WITHOUT CARDIOVERSION N/A 05/10/2019   Procedure: TRANSESOPHAGEAL ECHOCARDIOGRAM (TEE);  Surgeon: Lelon Perla, MD;  Location: [facility] ENDOSCOPY;  Service: Cardiovascular;  Laterality: N/A;    Family History  Problem Relation Age of Onset   Diabetes Mother    Other Father        unsure of medical history   Diabetes Sister    Mental illness Sister    Mental illness Brother     Social History   Socioeconomic History   Marital status: Single    Spouse name: Not on file   Number of children: 1   Years of education: 8th grade   Highest education level: Not on file  Occupational History   Occupation: Disabled  Tobacco Use   Smoking status: Former    Types: Cigarettes    Quit date: 12/21/2018    Years since quitting: 2.0   Smokeless tobacco: Never  Vaping Use   Vaping Use: Never used  Substance and Sexual Activity   Alcohol use: Not Currently   Drug use: Never   Sexual activity: Not Currently  Other Topics Concern   Not on file  Social History Narrative   Right-handed.   Lives at home with his daughter and son-in-law.   No daily use of caffeine.      Social Determinants of Health   Financial Resource Strain: Medium Risk   Difficulty of Paying Living Expenses: Somewhat hard  Food Insecurity: No Food Insecurity   Worried About Charity fundraiser in the Last Year: Never true   Ran Out of Food in the Last Year: Never true  Transportation Needs: No Transportation Needs   Lack of Transportation (Medical): No   Lack of Transportation (Non-Medical): No  Physical Activity: Not on file  Stress: Not on file  Social Connections: Not on file  Intimate Partner Violence: Not on file    Outpatient Medications Prior to Visit  Medication Sig Dispense Refill    acetaminophen (TYLENOL) 500 MG tablet Take 1,000 mg by mouth daily as needed for mild pain.     albuterol (VENTOLIN HFA) 108 (90 Base) MCG/ACT inhaler Inhale 2 puffs into the lungs every 6 (six) hours as needed for wheezing or shortness of breath. 8 g 6   amiodarone (PACERONE) 100 MG tablet Take 1 tablet (100 mg total) by mouth daily. 90 tablet 3   ANORO ELLIPTA 62.5-25 MCG/INH AEPB 1 puff daily.     atorvastatin (LIPITOR) 40 MG tablet TAKE 1 TABLET (40 MG TOTAL) BY MOUTH DAILY AT 6 PM. 90 tablet 3   carvedilol (COREG) 25 MG tablet TAKE 1 TABLET BY MOUTH TWICE A DAY 180 tablet 3   citalopram (CELEXA) 40 MG tablet Take 1 tablet (40 mg total) by mouth daily. 90 tablet 1   DULoxetine (CYMBALTA) 60 MG capsule TAKE 1 CAPSULE BY MOUTH EVERY DAY 90 capsule 3   ELIQUIS 5 MG TABS tablet TAKE 1 TABLET BY MOUTH TWICE A DAY 180 tablet 1   fluticasone-salmeterol (ADVAIR HFA) 115-21 MCG/ACT inhaler Inhale 2 puffs into the lungs 2 (two) times daily. 1 each 12   gabapentin (NEURONTIN) 300 MG capsule Take 3 capsules (900 mg total) by mouth at bedtime. 90 capsule 11   Heating Pads ([employer] HEATING PAD) PADS 1 application by Does not apply route 3 (three) times daily. 15 minute intervals only 1 each 0   hydrALAZINE (APRESOLINE) 50 MG tablet TAKE 1 TABLET (50 MG TOTAL) BY MOUTH IN THE MORNING AND AT BEDTIME. 180 tablet 3   pantoprazole (PROTONIX) 40 MG tablet Take 1 tablet (40 mg total) by mouth daily. 90 tablet 3   traZODone (DESYREL) 50 MG tablet Take half tablet at bedtime as needed for sleep. 90 tablet 1   levothyroxine (SYNTHROID) 25 MCG tablet TAKE 1 TABLET BY MOUTH DAILY BEFORE BREAKFAST. 90 tablet 0   amLODipine (NORVASC) 2.5 MG tablet Take 1 tablet (2.5 mg total) by mouth daily. 90 tablet 3   No facility-administered medications prior to visit.    Allergies  Allergen Reactions   Entresto [Sacubitril-Valsartan]     Abnormal kidney functions     ROS Review of Systems  Neurological:  Positive for  dizziness.     Objective:    Physical Exam Constitutional:      General: He is not in acute distress. HENT:     Head: Normocephalic and atraumatic.     Nose: Nose normal.     Mouth/Throat:     Mouth: Mucous membranes are moist.  Cardiovascular:     Rate and Rhythm: Normal rate and regular rhythm.     Pulses: Normal pulses.     Heart  sounds: Normal heart sounds.  Pulmonary:     Effort: Pulmonary effort is normal.     Breath sounds: Normal breath sounds.  Abdominal:     General: Bowel sounds are normal.     Palpations: Abdomen is soft. There is no mass.     Comments: Murphy's sign positive   Musculoskeletal:        General: Tenderness present.     Cervical back: Normal range of motion.  Skin:    General: Skin is warm and dry.     Capillary Refill: Capillary refill takes less than 2 seconds.     Findings: Rash (Right ankle with a scabbed area) present.  Neurological:     General: No focal deficit present.     Mental Status: He is alert.  Psychiatric:        Mood and Affect: Mood normal.        Behavior: Behavior normal.        Thought Content: Thought content normal.        Judgment: Judgment normal.    BP 125/83   Pulse 60   Temp (!) 97.2 F (36.2 C)   Ht _0  (1.702 m)   Wt 155 lb 3.2 oz (70.4 kg)   SpO2 97%   BMI 24.31 kg/m  Wt Readings from Last 3 Encounters:  01/01/21 155 lb 3.2 oz (70.4 kg)  11/07/20 153 lb 6.4 oz (69.6 kg)  11/05/20 154 lb (69.9 kg)     Health Maintenance Due  Topic Date Due   COVID-19 Vaccine (4 - Booster for Pfizer series) 07/30/2020   INFLUENZA VACCINE  10/20/2020    There are no preventive care reminders to display for this patient.  Lab Results  Component Value Date   TSH 10.400 (H) 12/31/2020   Lab Results  Component Value Date   WBC 6.1 06/20/2020   HGB 11.5 (L) 06/20/2020   HCT 36.4 (L) 06/20/2020   MCV 89.2 06/20/2020   PLT 220 06/20/2020   Lab Results  Component Value Date   NA 136 10/02/2020   K 4.3  10/02/2020   CO2 22 06/20/2020   GLUCOSE 87 10/02/2020   BUN 22 10/02/2020   CREATININE 1.44 (H) 10/02/2020   BILITOT 0.4 10/02/2020   ALKPHOS 89 10/02/2020   AST 35 10/02/2020   ALT 27 06/20/2020   PROT 6.8 10/02/2020   ALBUMIN 4.2 10/02/2020   CALCIUM 9.0 10/02/2020   ANIONGAP 8 06/20/2020   EGFR 56 (L) 10/02/2020   Lab Results  Component Value Date   CHOL 128 10/02/2020   Lab Results  Component Value Date   HDL 53 10/02/2020   Lab Results  Component Value Date   LDLCALC 52 10/02/2020   Lab Results  Component Value Date   TRIG 131 10/02/2020   Lab Results  Component Value Date   CHOLHDL 2.4 10/02/2020   No results found for: HGBA1C    Assessment & Plan:   Problem List Items Addressed This Visit       Endocrine   Hypothyroidism Persistent current TSH elevated at 10.4 increased from 9.8. Patient reports compliance with levothyroxine 25 mcg daily Increase levothyroxine to 50 mcg daily 72-monthfollow-up   Relevant Medications   levothyroxine (SYNTHROID) 50 MCG tablet     Other   MDD (major depressive disorder), recurrent episode, moderate (HCC) Persistent however stable  citalopram 40 mg, duloxetine 60 mg along with trazodone 50 mg    Other Visit Diagnoses  RUQ abdominal pain    -  Primary Worsening with positive Murphy sign Ultrasound pending   RLQ abdominal pain       Right groin hernia    Ultrasound for further evaluation pending   Rash     triamcinolone 0.5% twice daily       Meds ordered this encounter  Medications   levothyroxine (SYNTHROID) 50 MCG tablet    Sig: Take 1 tablet (50 mcg total) by mouth daily.    Dispense:  90 tablet    Refill:  3    Order Specific Question:   Supervising Provider    Answer:   Tresa Garter [[id number]]   triamcinolone ointment (KENALOG) 0.5 %    Sig: Apply 1 application topically 2 (two) times daily.    Dispense:  30 g    Refill:  0    Order Specific Question:   Supervising Provider     Answer:   Tresa Garter [id number]    Follow-up: Return in about 3 months (around 04/03/2021) for hypothyrodism and depression [id number].    Vevelyn Francois, NP

## 2021-01-01 NOTE — Patient Instructions (Addendum)
Hypothyroidism Hypothyroidism is when the thyroid gland does not make enough of certain hormones (it is underactive). The thyroid gland is a small gland located in the lower front part of the neck, just in front of the windpipe (trachea). This gland makes hormones that help control how the body uses food for energy (metabolism) as well as how the heart and brain function. These hormones also play a role in keeping your bones strong. When the thyroid is underactive, it produces too little of the hormones thyroxine (T4) and triiodothyronine (T3). What are the causes? This condition may be caused by: Hashimoto's disease. This is a disease in which the body's disease-fighting system (immune system) attacks the thyroid gland. This is the most common cause. Viral infections. Pregnancy. Certain medicines. Birth defects. Past radiation treatments to the head or neck for cancer. Past treatment with radioactive iodine. Past exposure to radiation in the environment. Past surgical removal of part or all of the thyroid. Problems with a gland in the center of the brain (pituitary gland). Lack of enough iodine in the diet. What increases the risk? You are more likely to develop this condition if: You are male. You have a family history of thyroid conditions. You use a medicine called lithium. You take medicines that affect the immune system (immunosuppressants). What are the signs or symptoms? Symptoms of this condition include: Feeling as though you have no energy (lethargy). Not being able to tolerate cold. Weight gain that is not explained by a change in diet or exercise habits. Lack of appetite. Dry skin. Coarse hair. Menstrual irregularity. Slowing of thought processes. Constipation. Sadness or depression. How is this diagnosed? This condition may be diagnosed based on: Your symptoms, your medical history, and a physical exam. Blood tests. You may also have imaging tests, such as an  ultrasound or MRI. How is this treated? This condition is treated with medicine that replaces the thyroid hormones that your body does not make. After you begin treatment, it may take several weeks for symptoms to go away. Follow these instructions at home: Take over-the-counter and prescription medicines only as told by your health care provider. If you start taking any new medicines, tell your health care provider. Keep all follow-up visits as told by your health care provider. This is important. As your condition improves, your dosage of thyroid hormone medicine may change. You will need to have blood tests regularly so that your health care provider can monitor your condition. Contact a health care provider if: Your symptoms do not get better with treatment. You are taking thyroid hormone replacement medicine and you: Sweat a lot. Have tremors. Feel anxious. Lose weight rapidly. Cannot tolerate heat. Have emotional swings. Have diarrhea. Feel weak. Get help right away if you have: Chest pain. An irregular heartbeat. A rapid heartbeat. Difficulty breathing. Summary Hypothyroidism is when the thyroid gland does not make enough of certain hormones (it is underactive). When the thyroid is underactive, it produces too little of the hormones thyroxine (T4) and triiodothyronine (T3). The most common cause is Hashimoto's disease, a disease in which the body's disease-fighting system (immune system) attacks the thyroid gland. The condition can also be caused by viral infections, medicine, pregnancy, or past radiation treatment to the head or neck. Symptoms may include weight gain, dry skin, constipation, feeling as though you do not have energy, and not being able to tolerate cold. This condition is treated with medicine to replace the thyroid hormones that your body does not make. This information   is not intended to replace advice given to you by your health care provider. Make sure you  discuss any questions you have with your health care provider. Document Revised: 12/07/2019 Document Reviewed: 11/22/2019 Elsevier Patient Education  2022 Else[employer] Inc.  Hernia, Adult   A hernia happens when tissue inside your body pushes out through a weak spot in your belly muscles (abdominal wall). This makes a round lump (bulge). The lump may be: In a scar from surgery that was done in your belly (incisional hernia). Near your belly button (umbilical hernia). In your groin (inguinal hernia). Your groin is the area where your leg meets your lower belly (abdomen). This kind of hernia could also be: In your scrotum, if you are male. In folds of skin around your vagina, if you are male. In your upper thigh (femoral hernia). Inside your belly (hiatal hernia). This happens when your stomach slides above the muscle between your belly and your chest (diaphragm). If your hernia is small and it does not cause pain, you may not need treatment. If your hernia is large or it causes pain, you may need surgery. Follow these instructions at home: Activity Avoid stretching or overusing (straining) the muscles near your hernia. Straining can happen when you: Lift something heavy. Poop (have a bowel movement). Do not lift anything that is heavier than 10 lb (4.5 kg), or the limit that you are told, until your doctor says that it is safe. Use the strength of your legs when you lift something heavy. Do not use only your back muscles to lift. General instructions Do these things if told by your doctor so you do not have trouble pooping (constipation): Drink enough fluid to keep your pee (urine) pale yellow. Eat foods that are high in fiber. These include fresh fruits and vegetables, whole grains, and beans. Limit foods that are high in fat and processed sugars. These include foods that are fried or sweet. Take medicine for trouble pooping. When you cough, try to cough gently. You may try to push your  hernia in by very gently pressing on it when you are lying down. Do not try to force the bulge back in if it will not push in easily. If you are overweight, work with your doctor to lose weight safely. Do not use any products that have nicotine or tobacco in them. These include cigarettes and e-cigarettes. If you need help quitting, ask your doctor. If you will be having surgery (hernia repair), watch your hernia for changes in shape, size, or color. Tell your doctor if you see any changes. Take over-the-counter and prescription medicines only as told by your doctor. Keep all follow-up visits as told by your doctor. Contact a doctor if: You get new pain, swelling, or redness near your hernia. You poop fewer times in a week than normal. You have trouble pooping. You have poop (stool) that is more dry than normal. You have poop that is harder or larger than normal. Get help right away if: You have a fever. You have belly pain that gets worse. You feel sick to your stomach (nauseous). You throw up (vomit). Your hernia cannot be pushed in by very gently pressing on it when you are lying down. Do not try to force the bulge back in if it will not push in easily. Your hernia: Changes in shape or size. Changes color. Feels hard or it hurts when you touch it. These symptoms may represent a serious problem that is an emergency.  Do not wait to see if the symptoms will go away. Get medical help right away. Call your local emergency services (911 in the [place] Summary A hernia happens when tissue inside your body pushes out through a weak spot in the belly muscles. This creates a bulge. If your hernia is small and it does not hurt, you may not need treatment. If your hernia is large or it hurts, you may need surgery. If you will be having surgery, watch your hernia for changes in shape, size, or color. Tell your doctor about any changes. This information is not intended to replace advice given to you  by your health care provider. Make sure you discuss any questions you have with your health care provider. Document Revised: 06/29/2018 Document Reviewed: 12/08/2016 Elsevier Patient Education  2021 Elsevier Inc.  Chronic Pain, Adult Chronic pain is a type of pain that lasts or keeps coming back for at least 3-6 months. You may have headaches, pain in the abdomen, or pain in other areas of the body. Chronic pain may be related to an illness, such as fibromyalgia or complex regional pain syndrome. Chronic pain may also be related to an injury or a health condition. Sometimes, the cause of chronic pain is not known. Chronic pain can make it hard for you to do daily activities. If not treated, chronic pain can lead to anxiety and depression. Treatment depends on the cause and severity of your pain. You may need to work with a pain specialist to come up with a treatment plan. The plan may include medicine, counseling, and physical therapy. Many people benefit from a combination of two or more types of treatment to control their pain. Follow these instructions at home: Medicines Take over-the-counter and prescription medicines only as told by your health care provider. Ask your health care provider if the medicine prescribed to you: Requires you to avoid driving or using machinery. Can cause constipation. You may need to take these actions to prevent or treat constipation: Drink enough fluid to keep your urine pale yellow. Take over-the-counter or prescription medicines. Eat foods that are high in fiber, such as beans, whole grains, and fresh fruits and vegetables. Limit foods that are high in fat and processed sugars, such as fried or sweet foods. Treatment plan Follow your treatment plan as told by your health care provider. This may include: Gentle, regular exercise. Eating a healthy diet that includes foods such as vegetables, fruits, fish, and lean meats. Cognitive or behavioral therapy that  changes the way you think or act in response to the pain. This may help improve how you feel. Working with a physical therapist. Meditation, yoga, acupuncture, or massage therapy. Aroma, color, light, or sound therapy. Local electrical stimulation. The electrical pulses help to relieve pain by temporarily stopping the nerve impulses that cause you to feel pain. Injections. These deliver numbing or pain-relieving medicines into the spine or the area of pain.  Lifestyle  Ask your health care provider whether you should keep a pain diary. Your health care provider will tell you what information to write in the diary. This may include when you have pain, what the pain feels like, and how medicines and other behaviors or treatments help to reduce the pain. Consider talking with a mental health care provider about how to manage chronic pain. Consider joining a chronic pain support group. Try to control or lower your stress levels. Talk with your health care provider about ways to do this. General instructions  Learn as much as you can about how to manage your chronic pain. Ask your health care provider if an intensive pain rehabilitation program or a chronic pain specialist would be helpful. Check your pain level as told by your health care provider. Ask your health care provider if you should use a pain scale. It is up to you to get the results of any tests that were done. Ask your health care provider, or the department that is doing the tests, when your results will be ready. Keep all follow-up visits as told by your health care provider. This is important. Contact a health care provider if: Your pain gets worse, or you have new pain. You have trouble sleeping. You have trouble doing your normal activities. Your pain is not controlled with treatment. You have side effects from pain medicine. You feel weak. You notice any other changes that show that your condition is getting worse. Get help  right away if: You lose feeling or have numbness in your body. You lose control of bowel or bladder function. Your pain suddenly gets much worse. You develop shaking or chills. You develop confusion. You develop chest pain. You have trouble breathing or shortness of breath. You pass out. You have thoughts about hurting yourself or others. If you ever feel like you may hurt yourself or others, or have thoughts about taking your own life, get help right away. Go to your nearest emergency department or: Call your local emergency services (911 in the [place] Call a suicide crisis helpline, such as the National Suicide Prevention Lifeline at 313-414-3831. This is open 24 hours a day in the [place] Text the Crisis Text Line at (917) 392-2544 (in the [place] Summary Chronic pain is a type of pain that lasts or keeps coming back for at least 3-6 months. Chronic pain may be related to an illness, injury, or other health condition. Sometimes, the cause of chronic pain is not known. Treatment depends on the cause and severity of your pain. Many people benefit from a combination of two or more types of treatment to control their pain. Follow your treatment plan as told by your health care provider. This information is not intended to replace advice given to you by your health care provider. Make sure you discuss any questions you have with your health care provider. Document Revised: 11/23/2018 Document Reviewed: 11/23/2018 Elsevier Patient Education  2022 [employer].

## 2021-01-02 ENCOUNTER — Emergency Department (HOSPITAL_COMMUNITY): Payer: Medicaid Other

## 2021-01-02 ENCOUNTER — Encounter (HOSPITAL_COMMUNITY): Payer: Self-pay

## 2021-01-02 ENCOUNTER — Emergency Department (HOSPITAL_COMMUNITY)
Admission: EM | Admit: 2021-01-02 | Discharge: 2021-01-02 | Disposition: A | Discharge status: Home / Self Care | Payer: Medicaid Other | Attending: Emergency Medicine | Admitting: Emergency Medicine

## 2021-01-02 ENCOUNTER — Other Ambulatory Visit: Payer: Self-pay

## 2021-01-02 DIAGNOSIS — I4891 Unspecified atrial fibrillation: Secondary | ICD-10-CM | POA: Diagnosis not present

## 2021-01-02 DIAGNOSIS — N189 Chronic kidney disease, unspecified: Secondary | ICD-10-CM | POA: Insufficient documentation

## 2021-01-02 DIAGNOSIS — R531 Weakness: Secondary | ICD-10-CM | POA: Diagnosis not present

## 2021-01-02 DIAGNOSIS — E039 Hypothyroidism, unspecified: Secondary | ICD-10-CM | POA: Insufficient documentation

## 2021-01-02 DIAGNOSIS — Z20822 Contact with and (suspected) exposure to covid-19: Secondary | ICD-10-CM | POA: Insufficient documentation

## 2021-01-02 DIAGNOSIS — I5042 Chronic combined systolic (congestive) and diastolic (congestive) heart failure: Secondary | ICD-10-CM | POA: Insufficient documentation

## 2021-01-02 DIAGNOSIS — R6883 Chills (without fever): Secondary | ICD-10-CM | POA: Diagnosis not present

## 2021-01-02 DIAGNOSIS — L299 Pruritus, unspecified: Secondary | ICD-10-CM | POA: Insufficient documentation

## 2021-01-02 DIAGNOSIS — R55 Syncope and collapse: Secondary | ICD-10-CM | POA: Insufficient documentation

## 2021-01-02 DIAGNOSIS — M25551 Pain in right hip: Secondary | ICD-10-CM | POA: Diagnosis not present

## 2021-01-02 DIAGNOSIS — Z87891 Personal history of nicotine dependence: Secondary | ICD-10-CM | POA: Diagnosis not present

## 2021-01-02 DIAGNOSIS — Z7901 Long term (current) use of anticoagulants: Secondary | ICD-10-CM | POA: Insufficient documentation

## 2021-01-02 DIAGNOSIS — I130 Hypertensive heart and chronic kidney disease with heart failure and stage 1 through stage 4 chronic kidney disease, or unspecified chronic kidney disease: Secondary | ICD-10-CM | POA: Diagnosis not present

## 2021-01-02 DIAGNOSIS — Z79899 Other long term (current) drug therapy: Secondary | ICD-10-CM | POA: Diagnosis not present

## 2021-01-02 LAB — URINALYSIS, ROUTINE W REFLEX MICROSCOPIC
Bilirubin Urine: NEGATIVE
Glucose, UA: NEGATIVE mg/dL
Hgb urine dipstick: NEGATIVE
Ketones, ur: NEGATIVE mg/dL
Leukocytes,Ua: NEGATIVE
Nitrite: NEGATIVE
Protein, ur: NEGATIVE mg/dL
Specific Gravity, Urine: 1.014 (ref 1.005–1.030)
pH: 5 (ref 5.0–8.0)

## 2021-01-02 LAB — CBC WITH DIFFERENTIAL/PLATELET
Abs Immature Granulocytes: 0.03 10*3/uL (ref 0.00–0.07)
Basophils Absolute: 0.1 10*3/uL (ref 0.0–0.1)
Basophils Relative: 1 %
Eosinophils Absolute: 0.2 10*3/uL (ref 0.0–0.5)
Eosinophils Relative: 4 %
HCT: 36.4 % — ABNORMAL LOW (ref 39.0–52.0)
Hemoglobin: 11.4 g/dL — ABNORMAL LOW (ref 13.0–17.0)
Immature Granulocytes: 1 %
Lymphocytes Relative: 19 %
Lymphs Abs: 1.1 10*3/uL (ref 0.7–4.0)
MCH: 29.2 pg (ref 26.0–34.0)
MCHC: 31.3 g/dL (ref 30.0–36.0)
MCV: 93.1 fL (ref 80.0–100.0)
Monocytes Absolute: 0.5 10*3/uL (ref 0.1–1.0)
Monocytes Relative: 8 %
Neutro Abs: 4 10*3/uL (ref 1.7–7.7)
Neutrophils Relative %: 67 %
Platelets: 213 10*3/uL (ref 150–400)
RBC: 3.91 MIL/uL — ABNORMAL LOW (ref 4.22–5.81)
RDW: 13.6 % (ref 11.5–15.5)
WBC: 5.9 10*3/uL (ref 4.0–10.5)
nRBC: 0 % (ref 0.0–0.2)

## 2021-01-02 LAB — COMPREHENSIVE METABOLIC PANEL
ALT: 32 U/L (ref 0–44)
AST: 32 U/L (ref 15–41)
Albumin: 3.7 g/dL (ref 3.5–5.0)
Alkaline Phosphatase: 77 U/L (ref 38–126)
Anion gap: 6 (ref 5–15)
BUN: 11 mg/dL (ref 6–20)
CO2: 24 mmol/L (ref 22–32)
Calcium: 9.1 mg/dL (ref 8.9–10.3)
Chloride: 106 mmol/L (ref 98–111)
Creatinine, Ser: 1.11 mg/dL (ref 0.61–1.24)
GFR, Estimated: >60 mL/min (ref >60)
Glucose, Bld: 127 mg/dL — ABNORMAL HIGH (ref 70–99)
Potassium: 3.9 mmol/L (ref 3.5–5.1)
Sodium: 136 mmol/L (ref 135–145)
Total Bilirubin: 0.8 mg/dL (ref 0.3–1.2)
Total Protein: 6.8 g/dL (ref 6.5–8.1)

## 2021-01-02 LAB — RESP PANEL BY RT-PCR (FLU A&B, COVID) ARPGX2
Influenza A by PCR: NEGATIVE
Influenza B by PCR: NEGATIVE
SARS Coronavirus 2 by RT PCR: NEGATIVE

## 2021-01-02 LAB — TROPONIN I (HIGH SENSITIVITY)
Troponin I (High Sensitivity): 4 ng/L (ref <18)
Troponin I (High Sensitivity): 5 ng/L (ref <18)

## 2021-01-02 NOTE — Discharge Instructions (Addendum)
You were seen in the emergency department for evaluation of a near fainting spell.  You had blood work EKG CAT scan of your head and chest x-ray that did not show any significant findings.  You had a COVID test that was pending at time of discharge.  You can follow this up in MyChart.  Please drink plenty of fluids.  Follow-up with your primary care doctor.  Return to the emergency department if any worsening or concerning symptoms

## 2021-01-02 NOTE — ED Provider Notes (Signed)
[facility] EMERGENCY DEPARTMENT Provider Note   CSN: [id number] Arrival date & time: 01/02/21  1420     History Chief Complaint  Patient presents with   Near Syncope    Seth Snyder is a 60 y.o. male.  Pt reports he felt weak and he almost passed out.  Pt report he almost passed out.  Pt reports his daughter helped him.  Pt did not hit his head. No loc  no injuries.  Pt reports his appetite has been good. No vomiting or diarrhea.  Pt reports feeling as if he has had some chills.  Pt also complains of itching to his right ankle and his right chest.  Pt reports some soreness to his right hip.  Pt states he did not injure hip today he has had hip pain for a while   The history is provided by the patient. The history is limited by a language barrier. A language interpreter was used.  Near Syncope This is a new problem. The current episode started less than 1 hour ago. The problem occurs constantly. The problem has not changed since onset.Associated symptoms include chest pain and abdominal pain. Nothing aggravates the symptoms. Nothing relieves the symptoms. He has tried nothing for the symptoms. The treatment provided no relief.      Past Medical History:  Diagnosis Date   Anemia    Atrial fibrillation (HCC)    Atypical chest pain 10/01/2020   Dizziness    Hypertension    Low back pain     Patient Active Problem List   Diagnosis Date Noted   Atypical chest pain 10/01/2020   Chronic kidney disease 09/18/2020   MDD (major depressive disorder), recurrent, in partial remission (HCC) 09/11/2020   Gait abnormality 07/29/2020   Neck pain 07/29/2020   Urinary incontinence 07/29/2020   Right hip pain 07/29/2020   MDD (major depressive disorder), recurrent episode, moderate (HCC) 04/15/2020   Persistent atrial fibrillation (HCC) 09/20/2019   Secondary hypercoagulable state (HCC) 09/20/2019   Abnormal nuclear stress test    Spinal stenosis of cervical region  08/13/2019   Protrusion of cervical intervertebral disc 08/13/2019   Shoulder pain    Chronic combined systolic and diastolic heart failure (HCC)    Atrial fibrillation with RVR (HCC) 05/07/2019   Hypothyroidism 05/07/2019   Hypomagnesemia 05/07/2019   Elevated troponin 05/07/2019   Anticoagulated 03/12/2019   Dry skin dermatitis 03/12/2019   Muscle spasm of right shoulder 03/12/2019   Peripheral edema 03/12/2019   Atrial fibrillation (HCC) 03/12/2019   Shifting sleep-work schedule, affecting sleep 03/31/2018   Tobacco abuse 03/31/2018   Balanitis 03/20/2018   Hx of gonorrhea 11/23/2017   Anemia 06/27/2015   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   H/O colonoscopy 07/12/2013   Atopic dermatitis 04/18/2013   Venous insufficiency of both lower extremities 04/12/2013   Low back pain 04/12/2013    Past Surgical History:  Procedure Laterality Date   CARDIOVERSION N/A 05/10/2019   Procedure: CARDIOVERSION;  Surgeon: Lewayne Bunting, MD;  Location: [facility] ENDOSCOPY;  Service: Cardiovascular;  Laterality: N/A;   COLONOSCOPY  07/12/2013   LEFT HEART CATH AND CORONARY ANGIOGRAPHY N/A 08/30/2019   Procedure: LEFT HEART CATH AND CORONARY ANGIOGRAPHY;  Surgeon: Corky Crafts, MD;  Location: [facility] INVASIVE CV LAB;  Service: Cardiovascular;  Laterality: N/A;   TEE WITHOUT CARDIOVERSION N/A 05/10/2019   Procedure: TRANSESOPHAGEAL ECHOCARDIOGRAM (TEE);  Surgeon: Lewayne Bunting, MD;  Location: [facility] ENDOSCOPY;  Service: Cardiovascular;  Laterality: N/A;  Family History  Problem Relation Age of Onset   Diabetes Mother    Other Father        unsure of medical history   Diabetes Sister    Mental illness Sister    Mental illness Brother     Social History   Tobacco Use   Smoking status: Former    Types: Cigarettes    Quit date: 12/21/2018    Years since quitting: 2.0   Smokeless tobacco: Never  Vaping Use   Vaping Use: Never used  Substance Use Topics   Alcohol use: Not Currently    Drug use: Never    Home Medications Prior to Admission medications   Medication Sig Start Date End Date Taking? Authorizing Provider  acetaminophen (TYLENOL) 500 MG tablet Take 1,000 mg by mouth daily as needed for mild pain.   Yes [provider]  albuterol (VENTOLIN HFA) 108 (90 Base) MCG/ACT inhaler Inhale 2 puffs into the lungs every 6 (six) hours as needed for wheezing or shortness of breath. 09/08/20  Yes Bevelyn Ngo, NP  amiodarone (PACERONE) 100 MG tablet Take 1 tablet (100 mg total) by mouth daily. 10/20/20  Yes Graciella Freer, PA-C  amLODipine (NORVASC) 2.5 MG tablet Take 2.5 mg by mouth daily.   Yes [provider]  ANORO ELLIPTA 62.5-25 MCG/INH AEPB Inhale 1 puff into the lungs 2 (two) times daily as needed (sob/wheezing). 11/03/20  Yes [provider]  atorvastatin (LIPITOR) 40 MG tablet TAKE 1 TABLET (40 MG TOTAL) BY MOUTH DAILY AT 6 PM. 06/24/20  Yes Chilton Si, MD  carvedilol (COREG) 25 MG tablet TAKE 1 TABLET BY MOUTH TWICE A DAY 12/15/20  Yes Chilton Si, MD  citalopram (CELEXA) 40 MG tablet Take 1 tablet (40 mg total) by mouth daily. 09/11/20  Yes Zena Amos, MD  DULoxetine (CYMBALTA) 60 MG capsule TAKE 1 CAPSULE BY MOUTH EVERY DAY 08/25/20  Yes Levert Feinstein, MD  ELIQUIS 5 MG TABS tablet TAKE 1 TABLET BY MOUTH TWICE A DAY 11/14/20  Yes Graciella Freer, PA-C  fluticasone-salmeterol (ADVAIR HFA) 115-21 MCG/ACT inhaler Inhale 2 puffs into the lungs 2 (two) times daily. 09/08/20  Yes Bevelyn Ngo, NP  gabapentin (NEURONTIN) 300 MG capsule Take 3 capsules (900 mg total) by mouth at bedtime. 03/31/20 03/31/21 Yes King, Shana Chute, NP  hydrALAZINE (APRESOLINE) 50 MG tablet TAKE 1 TABLET (50 MG TOTAL) BY MOUTH IN THE MORNING AND AT BEDTIME. Patient taking differently: Take 50 mg by mouth 2 (two) times daily as needed (high blood pressure). 12/15/20  Yes Chilton Si, MD  levothyroxine (SYNTHROID) 50 MCG tablet Take 1 tablet (50 mcg  total) by mouth daily. 01/01/21  Yes Barbette Merino, NP  pantoprazole (PROTONIX) 40 MG tablet Take 1 tablet (40 mg total) by mouth daily. 10/01/20  Yes Chilton Si, MD  traZODone (DESYREL) 50 MG tablet Take half tablet at bedtime as needed for sleep. 09/11/20  Yes Zena Amos, MD  triamcinolone ointment (KENALOG) 0.5 % Apply 1 application topically 2 (two) times daily. 01/01/21  Yes Barbette Merino, NP  amLODipine (NORVASC) 2.5 MG tablet Take 1 tablet (2.5 mg total) by mouth daily. 06/24/20 10/20/20  Chilton Si, MD  Heating Pads ([employer] HEATING PAD) PADS 1 application by Does not apply route 3 (three) times daily. 15 minute intervals only 11/12/20   Barbette Merino, NP    Allergies    Sherryll Burger [sacubitril-valsartan]  Review of Systems   Review of Systems  Constitutional:  Positive for chills and fatigue.  Cardiovascular:  Positive for chest pain and near-syncope.  Gastrointestinal:  Positive for abdominal pain.  All other systems reviewed and are negative.  Physical Exam Updated Vital Signs BP (!) 133/119   Pulse 64   Resp 16   SpO2 100%   Physical Exam Vitals reviewed.  Constitutional:      Appearance: Normal appearance.  HENT:     Head: Normocephalic and atraumatic.     Right Ear: Tympanic membrane normal.     Left Ear: Tympanic membrane normal.     Nose: Nose normal.  Eyes:     Extraocular Movements: Extraocular movements intact.     Pupils: Pupils are equal, round, and reactive to light.  Cardiovascular:     Rate and Rhythm: Normal rate.     Pulses: Normal pulses.  Pulmonary:     Effort: Pulmonary effort is normal.  Abdominal:     General: Abdomen is flat.  Musculoskeletal:        General: Normal range of motion.  Skin:    General: Skin is warm.  Neurological:     General: No focal deficit present.     Mental Status: He is alert.  Psychiatric:        Mood and Affect: Mood normal.    ED Results / Procedures / Treatments   Labs (all labs ordered are  listed, but only abnormal results are displayed) Labs Reviewed  CBC WITH DIFFERENTIAL/PLATELET - Abnormal; Notable for the following components:      Result Value   RBC 3.91 (*)    Hemoglobin 11.4 (*)    HCT 36.4 (*)    All other components within normal limits  COMPREHENSIVE METABOLIC PANEL - Abnormal; Notable for the following components:   Glucose, Bld 127 (*)    All other components within normal limits  RESP PANEL BY RT-PCR (FLU A&B, COVID) [id number]  URINALYSIS, ROUTINE W REFLEX MICROSCOPIC  TROPONIN I (HIGH SENSITIVITY)  TROPONIN I (HIGH SENSITIVITY)    EKG EKG Interpretation  Date/Time:  Friday January 02 2021 16:13:39 EDT Ventricular Rate:  57 PR Interval:  184 QRS Duration: 99 QT Interval:  467 QTC Calculation: 455 R Axis:   71 Text Interpretation: Sinus rhythm Probable left ventricular hypertrophy No significant change since prior 4/22 Confirmed by Meridee Score [id number]) on 01/02/2021 4:20:16 PM  Radiology CT HEAD WO CONTRAST ( )  Result Date: 01/02/2021 CLINICAL DATA:  Vertigo, near syncope. EXAM: CT HEAD WITHOUT CONTRAST TECHNIQUE: Contiguous axial images were obtained from the base of the skull through the vertex without intravenous contrast. COMPARISON:  MRI June 21, 2020 in head CT May 21, 2020 FINDINGS: Brain: Similar patchy and more confluent areas of periventricular and subcortical white matter hypoattenuation, most likely reflecting chronic microvascular ischemic disease. No evidence of acute large vascular territory infarction, hemorrhage, hydrocephalus, extra-axial collection or mass lesion/mass effect. Vascular: No hyperdense vessel. Atherosclerotic calcifications of the internal carotid arteries at skull base. Skull: Normal. Negative for fracture or focal lesion. Sinuses/Orbits: Mucosal thickening of the bilateral maxillary sinuses and ethmoid air cells, similar prior. Other: None. IMPRESSION: 1. No acute intracranial findings. 2. Similar white matter changes  likely reflecting chronic microvascular ischemic disease. 3. Paranasal sinus disease, similar prior. Electronically Signed   By: Maudry Mayhew M.D.   On: 01/02/2021 16:46   DG Chest Port 1 View  Result Date: 01/02/2021 CLINICAL DATA:  Syncope, chest pain, back pain EXAM: PORTABLE CHEST 1 VIEW COMPARISON:  Chest x-ray 06/20/2020 FINDINGS: The  heart and mediastinal contours are within normal limits. No focal consolidation. No pulmonary edema. No pleural effusion. No pneumothorax. No acute osseous abnormality. IMPRESSION: No active disease. Electronically Signed   By: Tish Frederickson M.D.   On: 01/02/2021 16:10    Procedures Procedures   Medications Ordered in ED Medications - No data to display  ED Course  I have reviewed the triage vital signs and the nursing notes.  Pertinent labs & imaging results that were available during my care of the patient were reviewed by me and considered in my medical decision making (see chart for details).    MDM Rules/Calculators/A&P                           MDM:  Pt has a normal neuro exam.  Ct scan no sign of acute cva.   Labs pending.  Pt's care turned over to Dr. Charm Barges at 7pm.  Final Clinical Impression(s) / ED Diagnoses Final diagnoses:  Near syncope  Weakness    Rx / DC Orders ED Discharge Orders     None        Osie Cheeks 01/02/21 1853    Terrilee Files, MD 01/03/21 1150

## 2021-01-02 NOTE — ED Triage Notes (Signed)
PT BIB ems due to a near syncopal episode. Pt has a hx of vertigo. Pt is on elliquis. Daughter caught pt. Pt went to PCP yesterday for a hernia. Pt is axox4 on arrival. PCP changed pts levothyroxine from 25mg  to 50mg .

## 2021-01-02 NOTE — ED Notes (Signed)
Expressed to pt the need of a urine sample, pt given urinal.

## 2021-01-06 ENCOUNTER — Encounter: Payer: Self-pay | Admitting: Physical Therapy

## 2021-01-06 ENCOUNTER — Ambulatory Visit: Payer: Medicaid Other | Admitting: Physical Therapy

## 2021-01-06 ENCOUNTER — Other Ambulatory Visit: Payer: Self-pay

## 2021-01-06 DIAGNOSIS — M6281 Muscle weakness (generalized): Secondary | ICD-10-CM | POA: Diagnosis not present

## 2021-01-06 DIAGNOSIS — M25551 Pain in right hip: Secondary | ICD-10-CM

## 2021-01-06 DIAGNOSIS — G8929 Other chronic pain: Secondary | ICD-10-CM

## 2021-01-06 DIAGNOSIS — M545 Low back pain, unspecified: Secondary | ICD-10-CM

## 2021-01-06 NOTE — Therapy (Signed)
[place]. [place], [place], [address] Phone: [phone]   Fax:  [phone]  Physical Therapy Treatment  Patient Details  Name: Seth Snyder MRN: [id number] Date of Birth: 23-Apr-1960 Referring Provider (PT): Marcial Pacas, MD   Encounter Date: 01/06/2021   PT End of Session - 01/06/21 1443     Visit Number 27    Authorization Type [employer] (VL: 27)    Authorization Time Period no authorization required    PT Start Time 8916    PT Stop Time 1518    PT Time Calculation (min) 41 min    Activity Tolerance Patient limited by pain;Patient tolerated treatment well    Behavior During Therapy [facility] for tasks assessed/performed;Flat affect             Past Medical History:  Diagnosis Date   Anemia    Atrial fibrillation ([place])    Atypical chest pain 10/01/2020   Dizziness    Hypertension    Low back pain     Past Surgical History:  Procedure Laterality Date   CARDIOVERSION N/A 05/10/2019   Procedure: CARDIOVERSION;  Surgeon: Lelon Perla, MD;  Location: [facility] ENDOSCOPY;  Service: Cardiovascular;  Laterality: N/A;   COLONOSCOPY  07/12/2013   LEFT HEART CATH AND CORONARY ANGIOGRAPHY N/A 08/30/2019   Procedure: LEFT HEART CATH AND CORONARY ANGIOGRAPHY;  Surgeon: Jettie Booze, MD;  Location: [place] CV LAB;  Service: Cardiovascular;  Laterality: N/A;   TEE WITHOUT CARDIOVERSION N/A 05/10/2019   Procedure: TRANSESOPHAGEAL ECHOCARDIOGRAM (TEE);  Surgeon: Lelon Perla, MD;  Location: [facility] ENDOSCOPY;  Service: Cardiovascular;  Laterality: N/A;    There were no vitals filed for this visit.   Subjective Assessment - 01/06/21 1445     Subjective Pt arrives with good standing posture in 4 WW. He was seen in the ER 4 days ago for syncope episode where he fell. He was cleared for CVA with CT, other vitals normal. Pt says he has a f/u with MD.    Patient is accompained by: Interpreter     Pertinent History HTN, A-Fib, Anemia, LBP    Limitations Standing;Walking;House hold activities    Diagnostic tests EMG nerve conduction study showed no significant abnormality, in specific, there is no evidence of bilateral lumbosacral radiculopathy, or right cervical radiculopathy.MRI of cervical spine showed multilevel degenerative changes, most prominent lower cervical region, C5-6, C6-T1, with mild canal stenosis, no cord signal abnormality, variable  degree of foraminal narrowing. MRI lumbar spine showed multilevel degenerative changes, no evidence of spinal canal or nerve root compression    Patient Stated Goals improve pain    Currently in Pain? Yes    Pain Location Hip    Pain Orientation Right;Anterior    Pain Descriptors / Indicators Burning    Pain Type Chronic pain                               [facility] Adult PT Treatment/Exercise - 01/06/21 0001       Lumbar Exercises: Aerobic   Nustep L5 x 6 min      Lumbar Exercises: Machines for Strengthening   Cybex Knee Extension 5# 2 sets 10   cued for full ROM   Cybex Knee Flexion 20lb 2x10   cued for full ROM     Lumbar Exercises: Standing   Row Theraband;Both;Strengthening;10 reps    Theraband Level (Row) Level 3 (Green)  Shoulder Extension Strengthening;Both;10 reps    Theraband Level (Shoulder Extension) Level 3 (Green)    Other Standing Lumbar Exercises isometric push into blue theraball 5 sec hold x 10      Lumbar Exercises: Seated   Sit to Stand 10 reps   3# wt chest press   Other Seated Lumbar Exercises blue Theraball lumbar stretch 5 ea left, right, straight                     PT Education - 01/06/21 1525     Education Details discussed using guided imagery to help with pain    Person(s) Educated Patient    Methods Explanation    Comprehension Verbalized understanding              PT Short Term Goals - 12/30/20 1400       PT SHORT TERM GOAL #1   Title Patient will be  independent with initial HEP for strength/stretching/ROM (ALL STGs: at 4th Visit)    Status Achieved               PT Long Term Goals - 12/30/20 1400       PT LONG TERM GOAL #1   Title Patient will be independent with final HEP for strength/balance/stretching and daily walking program (ALL LTGs Due: at 9th visit)    Status Partially Met      PT LONG TERM GOAL #2   Title Pt will be able to tolerate ambulating at least 595' during 6 MWT with rollator to demo MDIC and improved community mobility    Baseline 375  feet with rollator 1 standing rest and c/o increased pain  and some lightheadness. 4 min total walk time    Status On-going      PT LONG TERM GOAL #3   Title Pt will have improved Oswestry Low Back Disability score by at least 11 points to demonstrate [place]    Status On-going      PT LONG TERM GOAL #4   Title Patient will improve cervical ROM by 8 degrees in all direction to demonstrate improved ability to scan environment    Baseline limited by pain    Status On-going      PT LONG TERM GOAL #5   Title Patient will be able to ambulate x 100 ft with LRAD vs. No AD and Mod I for improved household mobility    Status Achieved                   Plan - 01/06/21 1528     Clinical Impression Statement Patient continues to move very slowly with therex. He completed all TE with only mild c/o pain in left shoulder with UE work. He reports compliance with HEP. PT discussed PNE and use of guided imagery to help with pain control. He verbalized understanding through interpreter. Patient to be discharged at next visit.    Personal Factors and Comorbidities Comorbidity 3+;Time since onset of injury/illness/exacerbation    Comorbidities HTN, A-Fib, Anemia, LBP    Examination-Activity Limitations Bed Mobility;Transfers;Stairs;Stand;Locomotion Level;Squat    PT Frequency 1x / week    PT Duration 8 weeks    PT Treatment/Interventions ADLs/Self Care Home Management;Aquatic  Therapy;Cryotherapy;Electrical Stimulation;Iontophoresis 53m/ml Dexamethasone;Moist Heat;DME Instruction;Stair training;Functional mobility training;Therapeutic activities;Gait training;Therapeutic exercise;Balance training;Neuromuscular re-education;Patient/family education;Orthotic Fit/Training;Manual techniques;Passive range of motion;Dry needling;Spinal Manipulations;Joint Manipulations    PT Next Visit Plan Finalize HEP, check goals and d/c for lack of progress    PT Home  Exercise Plan Access Code [email or web address]             Patient will benefit from skilled therapeutic intervention in order to improve the following deficits and impairments:  Abnormal gait, Decreased balance, Difficulty walking, Increased muscle spasms, Impaired sensation, Pain, Postural dysfunction, Impaired flexibility, Decreased strength, Decreased knowledge of use of DME, Decreased activity tolerance, Decreased coordination, Decreased range of motion, Decreased endurance  Visit Diagnosis: Muscle weakness (generalized)  Pain in right hip  Chronic right-sided low back pain, unspecified whether sciatica present     Problem List Patient Active Problem List   Diagnosis Date Noted   Atypical chest pain 10/01/2020   Chronic kidney disease 09/18/2020   MDD (major depressive disorder), recurrent, in partial remission ([place]) 09/11/2020   Gait abnormality 07/29/2020   Neck pain 07/29/2020   Urinary incontinence 07/29/2020   Right hip pain 07/29/2020   MDD (major depressive disorder), recurrent episode, moderate ([place]) 04/15/2020   Persistent atrial fibrillation ([place]) 09/20/2019   Secondary hypercoagulable state ([place]) 09/20/2019   Abnormal nuclear stress test    Spinal stenosis of cervical region 08/13/2019   Protrusion of cervical intervertebral disc 08/13/2019   Shoulder pain    Chronic combined systolic and diastolic heart failure (HCC)    Atrial fibrillation with RVR ([place]) 05/07/2019   Hypothyroidism 05/07/2019    Hypomagnesemia 05/07/2019   Elevated troponin 05/07/2019   Anticoagulated 03/12/2019   Dry skin dermatitis 03/12/2019   Muscle spasm of right shoulder 03/12/2019   Peripheral edema 03/12/2019   Atrial fibrillation ([place]) 03/12/2019   Shifting sleep-work schedule, affecting sleep 03/31/2018   Tobacco abuse 03/31/2018   Balanitis 03/20/2018   Hx of gonorrhea 11/23/2017   Anemia 06/27/2015   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 [place], [place], [address] Phone: [phone]   Fax:  [phone]  Name: Ellington Morocco Gipe MRN: [id number] Date of Birth: 1961/02/01

## 2021-01-08 ENCOUNTER — Other Ambulatory Visit: Payer: Self-pay

## 2021-01-08 ENCOUNTER — Ambulatory Visit (HOSPITAL_COMMUNITY)
Admission: RE | Admit: 2021-01-08 | Discharge: 2021-01-08 | Disposition: A | Discharge status: Home / Self Care | Payer: Medicaid Other | Source: Ambulatory Visit | Attending: Nurse Practitioner | Admitting: Nurse Practitioner

## 2021-01-08 DIAGNOSIS — K4090 Unilateral inguinal hernia, without obstruction or gangrene, not specified as recurrent: Secondary | ICD-10-CM | POA: Diagnosis present

## 2021-01-08 DIAGNOSIS — R1031 Right lower quadrant pain: Secondary | ICD-10-CM | POA: Diagnosis present

## 2021-01-08 DIAGNOSIS — R1011 Right upper quadrant pain: Secondary | ICD-10-CM | POA: Insufficient documentation

## 2021-01-09 ENCOUNTER — Encounter: Payer: Medicaid Other | Admitting: Physical Medicine and Rehabilitation

## 2021-01-13 ENCOUNTER — Ambulatory Visit: Payer: Medicaid Other | Admitting: Physical Therapy

## 2021-01-27 ENCOUNTER — Ambulatory Visit: Payer: Medicaid Other | Admitting: Physical Therapy

## 2021-01-28 ENCOUNTER — Encounter: Payer: Self-pay | Admitting: Physical Therapy

## 2021-01-28 ENCOUNTER — Ambulatory Visit: Payer: Medicaid Other | Attending: Neurology | Admitting: Physical Therapy

## 2021-01-28 ENCOUNTER — Other Ambulatory Visit: Payer: Self-pay

## 2021-01-28 DIAGNOSIS — R293 Abnormal posture: Secondary | ICD-10-CM | POA: Insufficient documentation

## 2021-01-28 DIAGNOSIS — G8929 Other chronic pain: Secondary | ICD-10-CM | POA: Diagnosis present

## 2021-01-28 DIAGNOSIS — M545 Low back pain, unspecified: Secondary | ICD-10-CM | POA: Insufficient documentation

## 2021-01-28 DIAGNOSIS — M6281 Muscle weakness (generalized): Secondary | ICD-10-CM | POA: Insufficient documentation

## 2021-01-28 DIAGNOSIS — M25551 Pain in right hip: Secondary | ICD-10-CM | POA: Diagnosis present

## 2021-01-28 NOTE — Therapy (Signed)
[place] [place], [place], [address] Phone: [phone]   Fax:  [phone]  Physical Therapy Treatment  Patient Details  Name: Seth Snyder MRN: [id number] Date of Birth: 02-25-1961 Referring Provider (PT): Marcial Pacas, MD   Encounter Date: 01/28/2021   PT End of Session - 01/28/21 1208     Visit Number 18    Authorization Type [employer] (VL: 27)    Authorization Time Period no authorization required    PT Start Time 1324    PT Stop Time 1220    PT Time Calculation (min) 35 min    Activity Tolerance Patient limited by pain    Behavior During Therapy [facility] for tasks assessed/performed;Flat affect             Past Medical History:  Diagnosis Date   Anemia    Atrial fibrillation ([place])    Atypical chest pain 10/01/2020   Dizziness    Hypertension    Low back pain     Past Surgical History:  Procedure Laterality Date   CARDIOVERSION N/A 05/10/2019   Procedure: CARDIOVERSION;  Surgeon: Lelon Perla, MD;  Location: [place];  Service: Cardiovascular;  Laterality: N/A;   COLONOSCOPY  07/12/2013   LEFT HEART CATH AND CORONARY ANGIOGRAPHY N/A 08/30/2019   Procedure: LEFT HEART CATH AND CORONARY ANGIOGRAPHY;  Surgeon: Jettie Booze, MD;  Location: [place] CV LAB;  Service: Cardiovascular;  Laterality: N/A;   TEE WITHOUT CARDIOVERSION N/A 05/10/2019   Procedure: TRANSESOPHAGEAL ECHOCARDIOGRAM (TEE);  Surgeon: Lelon Perla, MD;  Location: [facility] ENDOSCOPY;  Service: Cardiovascular;  Laterality: N/A;    There were no vitals filed for this visit.   Subjective Assessment - 01/28/21 1146     Subjective Same as before no change at all. Pain in the R low back, R shoulder, and neck is worst    Currently in Pain? Yes    Pain Score 7     Pain Location --   R low back, R hip, R shoulder and neck            Interpreter present                  [facility] Adult PT  Treatment/Exercise - 01/28/21 0001       Lumbar Exercises: Aerobic   Nustep L5 x 5 min      Moist Heat Therapy   Number Minutes Moist Heat 10 Minutes    Moist Heat Location Lumbar Spine;Hip      Manual Therapy   Manual Therapy Soft tissue mobilization;Myofascial release    Manual therapy comments tightness in paraspinales    Soft tissue mobilization STM to R iliacus and psoas, TFL, QL, and lumbar paraspinals    Myofascial Release manual TPR to R iliacus and psoas, TFL, QL, and lumbar paraspinals                       PT Short Term Goals - 12/30/20 1400       PT SHORT TERM GOAL #1   Title Patient will be independent with initial HEP for strength/stretching/ROM (ALL STGs: at 4th Visit)    Status Achieved               PT Long Term Goals - 12/30/20 1400       PT LONG TERM GOAL #1   Title Patient will be independent with final HEP for strength/balance/stretching and daily walking  program (ALL LTGs Due: at 9th visit)    Status Partially Met      PT LONG TERM GOAL #2   Title Pt will be able to tolerate ambulating at least 595' during 6 MWT with rollator to demo MDIC and improved community mobility    Baseline 375  feet with rollator 1 standing rest and c/o increased pain  and some lightheadness. 4 min total walk time    Status On-going      PT LONG TERM GOAL #3   Title Pt will have improved Oswestry Low Back Disability score by at least 11 points to demonstrate [place]    Status On-going      PT LONG TERM GOAL #4   Title Patient will improve cervical ROM by 8 degrees in all direction to demonstrate improved ability to scan environment    Baseline limited by pain    Status On-going      PT LONG TERM GOAL #5   Title Patient will be able to ambulate x 100 ft with LRAD vs. No AD and Mod I for improved household mobility    Status Achieved                   Plan - 01/28/21 1209     Clinical Impression Statement Pt enters clinic reporting no that's  his symptoms. Pt reports that his pain is worst. I expressed to pt that he should return to his MD due to lack of progress. Pt stated that he has already discussed this with his MD and he was told to finish his last two sessions of therapy. Pt stated that "Pressure on his back" helps the most. Tightness noted in para spinales and near the iliac crest. MHP to help with tightness    Personal Factors and Comorbidities Comorbidity 3+;Time since onset of injury/illness/exacerbation    Comorbidities HTN, A-Fib, Anemia, LBP    Examination-Activity Limitations Bed Mobility;Transfers;Stairs;Stand;Locomotion Level;Squat    Examination-Participation Restrictions Community Activity;Yard Work;Occupation    Stability/Clinical Decision Making Stable/Uncomplicated    Rehab Potential Good    PT Frequency 1x / week    PT Duration 8 weeks    PT Treatment/Interventions ADLs/Self Care Home Management;Aquatic Therapy;Cryotherapy;Electrical Stimulation;Iontophoresis 4mg /ml Dexamethasone;Moist Heat;DME Instruction;Stair training;Functional mobility training;Therapeutic activities;Gait training;Therapeutic exercise;Balance training;Neuromuscular re-education;Patient/family education;Orthotic Fit/Training;Manual techniques;Passive range of motion;Dry needling;Spinal Manipulations;Joint Manipulations    PT Next Visit Plan d/c for lack of progress             Patient will benefit from skilled therapeutic intervention in order to improve the following deficits and impairments:  Abnormal gait, Decreased balance, Difficulty walking, Increased muscle spasms, Impaired sensation, Pain, Postural dysfunction, Impaired flexibility, Decreased strength, Decreased knowledge of use of DME, Decreased activity tolerance, Decreased coordination, Decreased range of motion, Decreased endurance  Visit Diagnosis: Chronic right-sided low back pain, unspecified whether sciatica present  Pain in right hip  Muscle weakness  (generalized)  Abnormal posture     Problem List Patient Active Problem List   Diagnosis Date Noted   Atypical chest pain 10/01/2020   Chronic kidney disease 09/18/2020   MDD (major depressive disorder), recurrent, in partial remission ([place]) 09/11/2020   Gait abnormality 07/29/2020   Neck pain 07/29/2020   Urinary incontinence 07/29/2020   Right hip pain 07/29/2020   MDD (major depressive disorder), recurrent episode, moderate ([place]) 04/15/2020   Persistent atrial fibrillation ([place]) 09/20/2019   Secondary hypercoagulable state ([place]) 09/20/2019   Abnormal nuclear stress test    Spinal stenosis of cervical  region 08/13/2019   Protrusion of cervical intervertebral disc 08/13/2019   Shoulder pain    Chronic combined systolic and diastolic heart failure (HCC)    Atrial fibrillation with RVR ([place]) 05/07/2019   Hypothyroidism 05/07/2019   Hypomagnesemia 05/07/2019   Elevated troponin 05/07/2019   Anticoagulated 03/12/2019   Dry skin dermatitis 03/12/2019   Muscle spasm of right shoulder 03/12/2019   Peripheral edema 03/12/2019   Atrial fibrillation ([place]) 03/12/2019   Shifting sleep-work schedule, affecting sleep 03/31/2018   Tobacco abuse 03/31/2018   Balanitis 03/20/2018   Hx of gonorrhea 11/23/2017   Anemia 06/27/2015   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   H/O colonoscopy 07/12/2013   Atopic dermatitis 04/18/2013   Venous insufficiency of both lower extremities 04/12/2013   Low back pain 04/12/2013    Scot Jun, PTA 01/28/2021, 12:18 PM  [address] [place], [place], [address] Phone: [phone]   Fax:  [phone]  Name: Trevis Moritz Lever MRN: [id number] Date of Birth: [birth year]

## 2021-01-29 ENCOUNTER — Encounter: Payer: Self-pay | Admitting: Nurse Practitioner

## 2021-01-29 ENCOUNTER — Ambulatory Visit (INDEPENDENT_AMBULATORY_CARE_PROVIDER_SITE_OTHER): Payer: Medicaid Other | Admitting: Nurse Practitioner

## 2021-01-29 VITALS — BP 130/80 | HR 57 | Temp 98.6°F | Ht 67.0 in | Wt 154.0 lb

## 2021-01-29 DIAGNOSIS — E039 Hypothyroidism, unspecified: Secondary | ICD-10-CM | POA: Diagnosis not present

## 2021-01-29 NOTE — Progress Notes (Signed)
[place], [place]  [address] Phone:  [phone]   Fax:  [phone]   Established Patient Office Visit  Subjective:  Patient ID: Seth Snyder, male    DOB: 1960-03-31  Age: 60 y.o. MRN: [id number]  CC:  Chief Complaint  Patient presents with   Follow-up    ED 10/16; Near Syncope.  Patient stated he is still feeling dizzy, left sided pain. Has taken Tylenol with some relief.     HPI [facility] presents for follow up. He  has a past medical history of Anemia, Atrial fibrillation ([place]), Atypical chest pain (10/01/2020), Dizziness, Hypertension, and Low back pain.   He is following up for his hypothyroidism. He was prescribed levothyroxine 50 mcg.  He reports taking this as directed.  He denies any falls or syncopal episodes. He does report that he often feels like he is like he is going to fall. He lives with his daughter. She works Biochemist, clinical. He does have home health aide to assist 18 hours per week. He is requesting additional time.  He reports that his appetite is decreased and he is weak.   Past Medical History:  Diagnosis Date   Anemia    Atrial fibrillation ([place])    Atypical chest pain 10/01/2020   Dizziness    Hypertension    Low back pain     Past Surgical History:  Procedure Laterality Date   CARDIOVERSION N/A 05/10/2019   Procedure: CARDIOVERSION;  Surgeon: Lelon Perla, MD;  Location: [facility] ENDOSCOPY;  Service: Cardiovascular;  Laterality: N/A;   COLONOSCOPY  07/12/2013   LEFT HEART CATH AND CORONARY ANGIOGRAPHY N/A 08/30/2019   Procedure: LEFT HEART CATH AND CORONARY ANGIOGRAPHY;  Surgeon: Jettie Booze, MD;  Location: [place] CV LAB;  Service: Cardiovascular;  Laterality: N/A;   TEE WITHOUT CARDIOVERSION N/A 05/10/2019   Procedure: TRANSESOPHAGEAL ECHOCARDIOGRAM (TEE);  Surgeon: Lelon Perla, MD;  Location: [facility] ENDOSCOPY;  Service: Cardiovascular;  Laterality: N/A;    Family History   Problem Relation Age of Onset   Diabetes Mother    Other Father        unsure of medical history   Diabetes Sister    Mental illness Sister    Mental illness Brother     Social History   Socioeconomic History   Marital status: Single    Spouse name: Not on file   Number of children: 1   Years of education: 8th grade   Highest education level: Not on file  Occupational History   Occupation: Disabled  Tobacco Use   Smoking status: Former    Types: Cigarettes    Quit date: 12/21/2018    Years since quitting: 2.1   Smokeless tobacco: Never  Vaping Use   Vaping Use: Never used  Substance and Sexual Activity   Alcohol use: Not Currently   Drug use: Never   Sexual activity: Not Currently  Other Topics Concern   Not on file  Social History Narrative   Right-handed.   Lives at home with his daughter and son-in-law.   No daily use of caffeine.      Social Determinants of Health   Financial Resource Strain: Medium Risk   Difficulty of Paying Living Expenses: Somewhat hard  Food Insecurity: No Food Insecurity   Worried About Charity fundraiser in the Last Year: Never true   Ran Out of Food in the Last Year: Never true  Transportation Needs:  No Transportation Needs   Lack of Transportation (Medical): No   Lack of Transportation (Non-Medical): No  Physical Activity: Not on file  Stress: Not on file  Social Connections: Not on file  Intimate Partner Violence: Not on file    Outpatient Medications Prior to Visit  Medication Sig Dispense Refill   acetaminophen (TYLENOL) 500 MG tablet Take 1,000 mg by mouth daily as needed for mild pain.     albuterol (VENTOLIN HFA) 108 (90 Base) MCG/ACT inhaler Inhale 2 puffs into the lungs every 6 (six) hours as needed for wheezing or shortness of breath. 8 g 6   amiodarone (PACERONE) 100 MG tablet Take 1 tablet (100 mg total) by mouth daily. 90 tablet 3   amLODipine (NORVASC) 2.5 MG tablet Take 2.5 mg by mouth daily.     ANORO ELLIPTA  62.5-25 MCG/INH AEPB Inhale 1 puff into the lungs 2 (two) times daily as needed (sob/wheezing).     atorvastatin (LIPITOR) 40 MG tablet TAKE 1 TABLET (40 MG TOTAL) BY MOUTH DAILY AT 6 PM. 90 tablet 3   carvedilol (COREG) 25 MG tablet TAKE 1 TABLET BY MOUTH TWICE A DAY 180 tablet 3   citalopram (CELEXA) 40 MG tablet Take 1 tablet (40 mg total) by mouth daily. 90 tablet 1   DULoxetine (CYMBALTA) 60 MG capsule TAKE 1 CAPSULE BY MOUTH EVERY DAY 90 capsule 3   ELIQUIS 5 MG TABS tablet TAKE 1 TABLET BY MOUTH TWICE A DAY 180 tablet 1   fluticasone-salmeterol (ADVAIR HFA) 115-21 MCG/ACT inhaler Inhale 2 puffs into the lungs 2 (two) times daily. 1 each 12   gabapentin (NEURONTIN) 300 MG capsule Take 3 capsules (900 mg total) by mouth at bedtime. 90 capsule 11   Heating Pads ([employer] HEATING PAD) PADS 1 application by Does not apply route 3 (three) times daily. 15 minute intervals only 1 each 0   hydrALAZINE (APRESOLINE) 50 MG tablet TAKE 1 TABLET (50 MG TOTAL) BY MOUTH IN THE MORNING AND AT BEDTIME. (Patient taking differently: Take 50 mg by mouth 2 (two) times daily as needed (high blood pressure).) 180 tablet 3   levothyroxine (SYNTHROID) 50 MCG tablet Take 1 tablet (50 mcg total) by mouth daily. 90 tablet 3   pantoprazole (PROTONIX) 40 MG tablet Take 1 tablet (40 mg total) by mouth daily. 90 tablet 3   traZODone (DESYREL) 50 MG tablet Take half tablet at bedtime as needed for sleep. 90 tablet 1   triamcinolone ointment (KENALOG) 0.5 % Apply 1 application topically 2 (two) times daily. 30 g 0   amLODipine (NORVASC) 2.5 MG tablet Take 1 tablet (2.5 mg total) by mouth daily. 90 tablet 3   No facility-administered medications prior to visit.    Allergies  Allergen Reactions   Entresto [Sacubitril-Valsartan]     Abnormal kidney functions     ROS Review of Systems    Objective:    Physical Exam Constitutional:      General: He is not in acute distress.    Appearance: He is normal weight. He is not  ill-appearing, toxic-appearing or diaphoretic.  HENT:     Head: Normocephalic and atraumatic.  Cardiovascular:     Rate and Rhythm: Normal rate and regular rhythm.     Pulses: Normal pulses.     Heart sounds: Normal heart sounds.  Pulmonary:     Effort: Pulmonary effort is normal.     Breath sounds: Normal breath sounds.  Abdominal:     Palpations: Abdomen is soft.  Musculoskeletal:        General: Normal range of motion.     Cervical back: Normal range of motion.     Comments: Walker in use  Skin:    General: Skin is warm and dry.     Capillary Refill: Capillary refill takes less than 2 seconds.  Neurological:     General: No focal deficit present.     Mental Status: He is alert and oriented to person, place, and time.  Psychiatric:        Mood and Affect: Mood normal.        Behavior: Behavior normal.        Thought Content: Thought content normal.        Judgment: Judgment normal.    BP 130/80   Pulse (!) 57   Temp 98.6 F (37 C)   Ht _0  (1.702 m)   Wt 154 lb 0.4 oz (69.9 kg)   SpO2 97%   BMI 24.12 kg/m  Wt Readings from Last 3 Encounters:  01/29/21 154 lb 0.4 oz (69.9 kg)  01/01/21 155 lb 3.2 oz (70.4 kg)  11/07/20 153 lb 6.4 oz (69.6 kg)     Health Maintenance Due  Topic Date Due   INFLUENZA VACCINE  10/20/2020    There are no preventive care reminders to display for this patient.  Lab Results  Component Value Date   TSH 10.400 (H) 12/31/2020   Lab Results  Component Value Date   WBC 5.9 01/02/2021   HGB 11.4 (L) 01/02/2021   HCT 36.4 (L) 01/02/2021   MCV 93.1 01/02/2021   PLT 213 01/02/2021   Lab Results  Component Value Date   NA 136 01/02/2021   K 3.9 01/02/2021   CO2 24 01/02/2021   GLUCOSE 127 (H) 01/02/2021   BUN 11 01/02/2021   CREATININE 1.11 01/02/2021   BILITOT 0.8 01/02/2021   ALKPHOS 77 01/02/2021   AST 32 01/02/2021   ALT 32 01/02/2021   PROT 6.8 01/02/2021   ALBUMIN 3.7 01/02/2021   CALCIUM 9.1 01/02/2021   ANIONGAP  6 01/02/2021   EGFR 56 (L) 10/02/2020   Lab Results  Component Value Date   CHOL 128 10/02/2020   Lab Results  Component Value Date   HDL 53 10/02/2020   Lab Results  Component Value Date   LDLCALC 52 10/02/2020   Lab Results  Component Value Date   TRIG 131 10/02/2020   Lab Results  Component Value Date   CHOLHDL 2.4 10/02/2020   No results found for: HGBA1C    Assessment & Plan:   Problem List Items Addressed This Visit       Endocrine   Hypothyroidism - Primary Stable labs pending Encourage patient to take medication as directed Provided patient with samples of Ensure to use in between meals or meal replacement to help provide additional nutrition   Relevant Orders   TSH    T4, free    T3     No orders of the defined types were placed in this encounter.   Follow-up: Follow-up appointment already scheduled   Vevelyn Francois, NP

## 2021-01-29 NOTE — Patient Instructions (Signed)

## 2021-01-30 LAB — T4, FREE: Free T4: 1.37 ng/dL (ref 0.82–1.77)

## 2021-01-30 LAB — T3: T3, Total: 78 ng/dL (ref 71–180)

## 2021-01-30 LAB — TSH: TSH: 8.9 u[IU]/mL — ABNORMAL HIGH (ref 0.450–4.500)

## 2021-01-31 ENCOUNTER — Encounter: Payer: Self-pay | Admitting: Nurse Practitioner

## 2021-02-02 ENCOUNTER — Other Ambulatory Visit: Payer: Self-pay | Admitting: Nurse Practitioner

## 2021-02-02 MED ORDER — LEVOTHYROXINE SODIUM 75 MCG PO TABS
75.0000 ug | ORAL_TABLET | Freq: Every day | ORAL | 11 refills | Status: DC
Start: 2021-02-02 — End: 2021-10-19

## 2021-02-06 ENCOUNTER — Ambulatory Visit (INDEPENDENT_AMBULATORY_CARE_PROVIDER_SITE_OTHER): Payer: Medicaid Other | Admitting: Orthopaedic Surgery

## 2021-02-06 ENCOUNTER — Encounter: Payer: Self-pay | Admitting: Orthopaedic Surgery

## 2021-02-06 ENCOUNTER — Other Ambulatory Visit: Payer: Self-pay

## 2021-02-06 ENCOUNTER — Ambulatory Visit: Payer: Medicaid Other | Admitting: Physical Medicine and Rehabilitation

## 2021-02-06 VITALS — BP 106/73 | HR 58 | Ht 67.0 in | Wt 154.0 lb

## 2021-02-06 DIAGNOSIS — M5020 Other cervical disc displacement, unspecified cervical region: Secondary | ICD-10-CM | POA: Diagnosis not present

## 2021-02-09 NOTE — Progress Notes (Signed)
Office Visit Note   Patient: Seth Snyder           Date of Birth: April 23, 1960           MRN: [id number] Visit Date: 02/06/2021              Requested by: Vevelyn Francois, NP [address] [place],  [place] [address] PCP: Vevelyn Francois, NP   Assessment & Plan: Visit Diagnoses:  1. Protrusion of cervical intervertebral disc     Plan: We discussed surgical options for his persistent symptoms cervical spine patient has a neurology appointment coming up in the next month or so and he states he like to return after his neurology appointment.  He is already on Neurontin.  He is still taking his Eliquis for atrial fibrillation.  He can follow-up with me in a few months.  Follow-Up Instructions: No follow-ups on file.   Orders:  No orders of the defined types were placed in this encounter.  No orders of the defined types were placed in this encounter.     Procedures: No procedures performed   Clinical Data: No additional findings.   Subjective: Chief Complaint  Patient presents with   Neck - Follow-up   Right Hip - Follow-up    HPI 60 year old male returns he is here with an interpreter with ongoing problems with cervical disc protrusion and stenosis with two-level involvement C5-6 C6-7.  He does have some hip osteoarthritis as well but neck is been giving him more problems.  He has numbness and tingling burning in his shoulder much more on the right minimal on the left.  He is amatory without any walking aids.  Patient had been using a rolling walker.  No falling problems denies balance problems.  Last MRI 08/22/2020 cervical spine did show some improvement of the foraminal disc extrusion at C6-7 and unchanged moderate foraminal stenosis C5-6 and mild central stenosis.  He did have some foraminal stenosis on the left at C3-4 but no symptoms on that side.  Review of Systems all other systems noncontributory to HPI.  He has had problems with depression, venous  insufficiency lower extremities, atrial fibrillation on Eliquis.   Objective: Vital Signs: BP 106/73   Pulse (!) 58   Ht 5\' 7"  (1.702 m)   Wt 154 lb (69.9 kg)   BMI 24.12 kg/m   Physical Exam Constitutional:      Appearance: He is well-developed.  HENT:     Head: Normocephalic and atraumatic.     Right Ear: External ear normal.     Left Ear: External ear normal.  Eyes:     Pupils: Pupils are equal, round, and reactive to light.  Neck:     Thyroid: No thyromegaly.     Trachea: No tracheal deviation.  Pulmonary:     Effort: Pulmonary effort is normal.     Breath sounds: No wheezing.  Abdominal:     General: Bowel sounds are normal.     Palpations: Abdomen is soft.  Musculoskeletal:     Cervical back: Neck supple.  Skin:    General: Skin is warm and dry.     Capillary Refill: Capillary refill takes less than 2 seconds.  Neurological:     Mental Status: He is alert and oriented to person, place, and time.  Psychiatric:        Behavior: Behavior normal.        Thought Content: Thought content normal.  Judgment: Judgment normal.    Ortho Exam some pain with right hip internal rotation is amatory with mild Trendelenburg gait.  He has brachial plexus tenderness.  Upper extremity reflexes are 2+ and symmetrical.  Pulses at the wrist are normal.  No upper extremity atrophy.  Positive brachial plexus tenderness worse on the right than the left.  Specialty Comments:  No specialty comments available.  Imaging: No results found.   PMFS History: Patient Active Problem List   Diagnosis Date Noted   Atypical chest pain 10/01/2020   Chronic kidney disease 09/18/2020   MDD (major depressive disorder), recurrent, in partial remission ([facility]CC) 09/11/2020   Gait abnormality 07/29/2020   Neck pain 07/29/2020   Urinary incontinence 07/29/2020   Right hip pain 07/29/2020   MDD (major depressive disorder), recurrent episode, moderate ([facility]CC) 04/15/2020   Persistent atrial  fibrillation ([facility]CC) 09/20/2019   Secondary hypercoagulable state (HCC) 09/20/2019   Abnormal nuclear stress test    Spinal stenosis of cervical region 08/13/2019   Protrusion of cervical intervertebral disc 08/13/2019   Shoulder pain    Chronic combined systolic and diastolic heart failure (HCC)    Atrial fibrillation with RVR (HCC) 05/07/2019   Hypothyroidism 05/07/2019   Hypomagnesemia 05/07/2019   Elevated troponin 05/07/2019   Anticoagulated 03/12/2019   Dry skin dermatitis 03/12/2019   Muscle spasm of right shoulder 03/12/2019   Peripheral edema 03/12/2019   Atrial fibrillation ([facility]CC) 03/12/2019   Shifting sleep-work schedule, affecting sleep 03/31/2018   Tobacco abuse 03/31/2018   Balanitis 03/20/2018   Hx of gonorrhea 11/23/2017   Anemia 06/27/2015   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   H/O colonoscopy 07/12/2013   Atopic dermatitis 04/18/2013   Venous insufficiency of both lower extremities 04/12/2013   Low back pain 04/12/2013   Past Medical History:  Diagnosis Date   Anemia    Atrial fibrillation (HCC)    Atypical chest pain 10/01/2020   Dizziness    Hypertension    Low back pain     Family History  Problem Relation Age of Onset   Diabetes Mother    Other Father        unsure of medical history   Diabetes Sister    Mental illness Sister    Mental illness Brother     Past Surgical History:  Procedure Laterality Date   CARDIOVERSION N/A 05/10/2019   Procedure: CARDIOVERSION;  Surgeon: Lewayne Bunting, MD;  Location[facility] ENDOSCOPY;  Service: Cardiovascular;  Laterality: N/A;   COLONOSCOPY  07/12/2013   LEFT HEART CATH AND CORONARY ANGIOGRAPHY N/A 08/30/2019   Procedure: LEFT HEART CATH AND CORONARY ANGIOGRAPHY;  Surgeon: Corky Crafts, MD;  Location: [facility] INVASIVE CV LAB;  Service: Cardiovascular;  Laterality: N/A;   TEE WITHOUT CARDIOVERSION N/A 05/10/2019   Procedure: TRANSESOPHAGEAL ECHOCARDIOGRAM (TEE);  Surgeon: Lewayne Bunting, MD;  Location: [facility]  ENDOSCOPY;  Service: Cardiovascular;  Laterality: N/A;   Social History   Occupational History   Occupation: Disabled  Tobacco Use   Smoking status: Former    Types: Cigarettes    Quit date: 12/21/2018    Years since quitting: 2.1   Smokeless tobacco: Never  Vaping Use   Vaping Use: Never used  Substance and Sexual Activity   Alcohol use: Not Currently   Drug use: Never   Sexual activity: Not Currently

## 2021-02-16 ENCOUNTER — Ambulatory Visit (INDEPENDENT_AMBULATORY_CARE_PROVIDER_SITE_OTHER): Payer: Medicaid Other | Admitting: Nurse Practitioner

## 2021-02-16 ENCOUNTER — Other Ambulatory Visit: Payer: Self-pay

## 2021-02-16 ENCOUNTER — Encounter: Payer: Self-pay | Admitting: Nurse Practitioner

## 2021-02-16 VITALS — BP 106/73 | HR 60 | Temp 97.9°F | Ht 67.0 in | Wt 157.8 lb

## 2021-02-16 DIAGNOSIS — E039 Hypothyroidism, unspecified: Secondary | ICD-10-CM

## 2021-02-16 DIAGNOSIS — R200 Anesthesia of skin: Secondary | ICD-10-CM

## 2021-02-16 DIAGNOSIS — I4891 Unspecified atrial fibrillation: Secondary | ICD-10-CM

## 2021-02-16 DIAGNOSIS — R202 Paresthesia of skin: Secondary | ICD-10-CM | POA: Diagnosis not present

## 2021-02-16 MED ORDER — AMLODIPINE BESYLATE 2.5 MG PO TABS: 2.5000 mg | ORAL_TABLET | Freq: Every day | ORAL | 3 refills | Status: DC

## 2021-02-16 MED ORDER — GABAPENTIN 300 MG PO CAPS: 900.0000 mg | ORAL_CAPSULE | Freq: Every day | ORAL | 3 refills | Status: DC

## 2021-02-16 NOTE — Patient Instructions (Signed)

## 2021-02-16 NOTE — Progress Notes (Signed)
Seth Snyder, [name]  [id number] Phone:  [phone]   Fax:  [phone]    Established Patient Office Visit  Subjective:  Patient ID: Seth Snyder, male    DOB: 01/24/1961  Age: 60 y.o. MRN: [id number]  CC:  Chief Complaint  Patient presents with   Follow-up    Pt has a language barrier and speaks [place].  Pt is here for his follow up visit. Pt states that he has been having some lower back pains and right shoulder pains.    HPI Seth Snyder [facility] presents for follow up. She  has a past medical history of Anemia, Atrial fibrillation ([place]), Atypical chest pain (10/01/2020), Dizziness, Hypertension, and Low back pain.   He has some burning on the back pain. He will follow up with orthopedics in February.  He has completed PT which was not effective. He reports that the is to have an ACDF which will be effective to stop his pain. He is concern about the surgery.   He feels like he has problems with breathing out with sitting. He does have chest pain he describes it as burning to the chest. He denies any fever or chills. His Xray on 01/02/21 was negative. He denies any cough or congestion. He does have an albuterol inhaler which is somewhat effective.   He has appetite has improved some. He is up 3 pound in the last few weeks. He did like the Ensure samples. He denies nausea or vomiting. He continues to have diarrhea; 2 episodes this morning. He is SP colonoscopy in 2014.   He reports that he needs and refill on his Amlodipine 2.5 mg. This is prescribed by cardiology, however this request has not been filled.   Past Medical History:  Diagnosis Date   Anemia    Atrial fibrillation ([place])    Atypical chest pain 10/01/2020   Dizziness    Hypertension    Low back pain     Past Surgical History:  Procedure Laterality Date   CARDIOVERSION N/A 05/10/2019   Procedure: CARDIOVERSION;  Surgeon: Lelon Perla, MD;  Location: [facility] ENDOSCOPY;   Service: Cardiovascular;  Laterality: N/A;   COLONOSCOPY  07/12/2013   LEFT HEART CATH AND CORONARY ANGIOGRAPHY N/A 08/30/2019   Procedure: LEFT HEART CATH AND CORONARY ANGIOGRAPHY;  Surgeon: Jettie Booze, MD;  Location: [place] CV LAB;  Service: Cardiovascular;  Laterality: N/A;   TEE WITHOUT CARDIOVERSION N/A 05/10/2019   Procedure: TRANSESOPHAGEAL ECHOCARDIOGRAM (TEE);  Surgeon: Lelon Perla, MD;  Location: [facility] ENDOSCOPY;  Service: Cardiovascular;  Laterality: N/A;    Family History  Problem Relation Age of Onset   Diabetes Mother    Other Father        unsure of medical history   Diabetes Sister    Mental illness Sister    Mental illness Brother     Social History   Socioeconomic History   Marital status: Single    Spouse name: Not on file   Number of children: 1   Years of education: 8th grade   Highest education level: Not on file  Occupational History   Occupation: Disabled  Tobacco Use   Smoking status: Former    Types: Cigarettes    Quit date: 12/21/2018    Years since quitting: 2.1   Smokeless tobacco: Never  Vaping Use   Vaping Use: Never used  Substance and Sexual Activity   Alcohol use: Not Currently  Drug use: Never   Sexual activity: Not Currently  Other Topics Concern   Not on file  Social History Narrative   Right-handed.   Lives at home with his daughter and son-in-law.   No daily use of caffeine.      Social Determinants of Health   Financial Resource Strain: Medium Risk   Difficulty of Paying Living Expenses: Somewhat hard  Food Insecurity: No Food Insecurity   Worried About Charity fundraiser in the Last Year: Never true   Ran Out of Food in the Last Year: Never true  Transportation Needs: No Transportation Needs   Lack of Transportation (Medical): No   Lack of Transportation (Non-Medical): No  Physical Activity: Not on file  Stress: Not on file  Social Connections: Not on file  Intimate Partner Violence: Not on file     Outpatient Medications Prior to Visit  Medication Sig Dispense Refill   acetaminophen (TYLENOL) 500 MG tablet Take 1,000 mg by mouth daily as needed for mild pain.     albuterol (VENTOLIN HFA) 108 (90 Base) MCG/ACT inhaler Inhale 2 puffs into the lungs every 6 (six) hours as needed for wheezing or shortness of breath. 8 g 6   amiodarone (PACERONE) 100 MG tablet Take 1 tablet (100 mg total) by mouth daily. 90 tablet 3   ANORO ELLIPTA 62.5-25 MCG/INH AEPB Inhale 1 puff into the lungs 2 (two) times daily as needed (sob/wheezing).     atorvastatin (LIPITOR) 40 MG tablet TAKE 1 TABLET (40 MG TOTAL) BY MOUTH DAILY AT 6 PM. 90 tablet 3   carvedilol (COREG) 25 MG tablet TAKE 1 TABLET BY MOUTH TWICE A DAY 180 tablet 3   citalopram (CELEXA) 40 MG tablet Take 1 tablet (40 mg total) by mouth daily. 90 tablet 1   DULoxetine (CYMBALTA) 60 MG capsule TAKE 1 CAPSULE BY MOUTH EVERY DAY 90 capsule 3   ELIQUIS 5 MG TABS tablet TAKE 1 TABLET BY MOUTH TWICE A DAY 180 tablet 1   fluticasone-salmeterol (ADVAIR HFA) 115-21 MCG/ACT inhaler Inhale 2 puffs into the lungs 2 (two) times daily. 1 each 12   Heating Pads ([employer] HEATING PAD) PADS 1 application by Does not apply route 3 (three) times daily. 15 minute intervals only 1 each 0   hydrALAZINE (APRESOLINE) 50 MG tablet TAKE 1 TABLET (50 MG TOTAL) BY MOUTH IN THE MORNING AND AT BEDTIME. (Patient taking differently: Take 50 mg by mouth 2 (two) times daily as needed (high blood pressure).) 180 tablet 3   levothyroxine (SYNTHROID) 75 MCG tablet Take 1 tablet (75 mcg total) by mouth daily. 30 tablet 11   pantoprazole (PROTONIX) 40 MG tablet Take 1 tablet (40 mg total) by mouth daily. 90 tablet 3   traZODone (DESYREL) 50 MG tablet Take half tablet at bedtime as needed for sleep. 90 tablet 1   triamcinolone ointment (KENALOG) 0.5 % Apply 1 application topically 2 (two) times daily. 30 g 0   amLODipine (NORVASC) 2.5 MG tablet Take 2.5 mg by mouth daily.     gabapentin  (NEURONTIN) 300 MG capsule Take 3 capsules (900 mg total) by mouth at bedtime. 90 capsule 11   No facility-administered medications prior to visit.    Allergies  Allergen Reactions   Entresto [Sacubitril-Valsartan]     Abnormal kidney functions     ROS Review of Systems    Objective:    Physical Exam Constitutional:      Appearance: He is normal weight.  HENT:  Head: Normocephalic and atraumatic.     Nose: Nose normal.     Mouth/Throat:     Mouth: Mucous membranes are moist.  Cardiovascular:     Rate and Rhythm: Normal rate and regular rhythm.     Pulses: Normal pulses.     Heart sounds: Normal heart sounds.  Pulmonary:     Effort: Pulmonary effort is normal. No respiratory distress.     Breath sounds: No stridor. No wheezing or rhonchi.     Comments: Diminished  Abdominal:     General: Bowel sounds are normal.     Palpations: Abdomen is soft.     Tenderness: There is abdominal tenderness (lower quqadrant).  Musculoskeletal:        General: Normal range of motion.     Cervical back: Normal range of motion.  Skin:    General: Skin is warm.     Capillary Refill: Capillary refill takes less than 2 seconds.  Neurological:     General: No focal deficit present.     Mental Status: He is alert and oriented to person, place, and time.  Psychiatric:        Mood and Affect: Mood normal.        Behavior: Behavior normal.        Thought Content: Thought content normal.    BP 106/73   Pulse 60   Temp 97.9 F (36.6 C)   Ht 5' 7"  (1.702 m)   Wt 157 lb 12.8 oz (71.6 kg)   SpO2 98%   BMI 24.71 kg/m  Wt Readings from Last 3 Encounters:  02/16/21 157 lb 12.8 oz (71.6 kg)  02/06/21 154 lb (69.9 kg)  01/29/21 154 lb 0.4 oz (69.9 kg)     Health Maintenance Due  Topic Date Due   COVID-19 Vaccine (4 - Booster for Pfizer series) 05/27/2020   INFLUENZA VACCINE  10/20/2020    There are no preventive care reminders to display for this patient.  Lab Results   Component Value Date   TSH 8.900 (H) 01/29/2021   Lab Results  Component Value Date   WBC 5.9 01/02/2021   HGB 11.4 (L) 01/02/2021   HCT 36.4 (L) 01/02/2021   MCV 93.1 01/02/2021   PLT 213 01/02/2021   Lab Results  Component Value Date   NA 136 01/02/2021   K 3.9 01/02/2021   CO2 24 01/02/2021   GLUCOSE 127 (H) 01/02/2021   BUN 11 01/02/2021   CREATININE 1.11 01/02/2021   BILITOT 0.8 01/02/2021   ALKPHOS 77 01/02/2021   AST 32 01/02/2021   ALT 32 01/02/2021   PROT 6.8 01/02/2021   ALBUMIN 3.7 01/02/2021   CALCIUM 9.1 01/02/2021   ANIONGAP 6 01/02/2021   EGFR 56 (L) 10/02/2020   Lab Results  Component Value Date   CHOL 128 10/02/2020   Lab Results  Component Value Date   HDL 53 10/02/2020   Lab Results  Component Value Date   LDLCALC 52 10/02/2020   Lab Results  Component Value Date   TRIG 131 10/02/2020   Lab Results  Component Value Date   CHOLHDL 2.4 10/02/2020   No results found for: HGBA1C    Assessment & Plan:   Problem List Items Addressed This Visit       Cardiovascular and Mediastinum   Atrial fibrillation ([place]) Stable  Refill completed   Relevant Medications   amLODipine (NORVASC) 2.5 MG tablet     Endocrine   Hypothyroidism - Primary Stable Continue with  current regimen.  No changes warranted. Good patient compliance.    Other Visit Diagnoses     Numbness and tingling of right upper extremity    Persistent  Continue with current regimen.  Causes sedation adjusting the dose is contraindicated given his past of frequent falls.    Relevant Medications   gabapentin (NEURONTIN) 300 MG capsule       Meds ordered this encounter  Medications   amLODipine (NORVASC) 2.5 MG tablet    Sig: Take 1 tablet (2.5 mg total) by mouth daily.    Dispense:  90 tablet    Refill:  3    Easy open bottles please   gabapentin (NEURONTIN) 300 MG capsule    Sig: Take 3 capsules (900 mg total) by mouth at bedtime.    Dispense:  270 capsule     Refill:  3    Easy open bottles please    Follow-up: Return in about 3 months (around 05/19/2021).    Vevelyn Francois, NP

## 2021-03-09 ENCOUNTER — Other Ambulatory Visit: Payer: Self-pay

## 2021-03-09 ENCOUNTER — Encounter: Payer: Self-pay | Admitting: Physical Medicine and Rehabilitation

## 2021-03-09 ENCOUNTER — Encounter
Payer: Medicaid Other | Attending: Physical Medicine and Rehabilitation | Admitting: Physical Medicine and Rehabilitation

## 2021-03-09 VITALS — BP 125/80 | HR 62 | Ht 67.0 in | Wt 162.0 lb

## 2021-03-09 DIAGNOSIS — M542 Cervicalgia: Secondary | ICD-10-CM | POA: Diagnosis present

## 2021-03-09 DIAGNOSIS — M4802 Spinal stenosis, cervical region: Secondary | ICD-10-CM | POA: Insufficient documentation

## 2021-03-09 DIAGNOSIS — G8929 Other chronic pain: Secondary | ICD-10-CM | POA: Insufficient documentation

## 2021-03-09 DIAGNOSIS — M5441 Lumbago with sciatica, right side: Secondary | ICD-10-CM | POA: Diagnosis present

## 2021-03-09 DIAGNOSIS — M792 Neuralgia and neuritis, unspecified: Secondary | ICD-10-CM | POA: Insufficient documentation

## 2021-03-09 DIAGNOSIS — M5442 Lumbago with sciatica, left side: Secondary | ICD-10-CM | POA: Diagnosis present

## 2021-03-09 MED ORDER — LEVETIRACETAM 250 MG PO TABS: 250.0000 mg | ORAL_TABLET | Freq: Two times a day (BID) | ORAL | 5 refills | Status: DC

## 2021-03-09 NOTE — Patient Instructions (Signed)
Pt is a 60 yr old male with hx of  (no DM);  Afib?- on Amiodarone; HTN; hypothyroidism; and on Eliquis; so cannot have Epidural steroid injections; also depression and nerve/back pain; here for evaluation of neck and back pain with radiculopathy/sciatica.    Keppra/Levicetracem- 250 mg 2x/day x 1 week, then 500 mg 2x/day for back/nerve pain.   2. Already on Cymbalta and Gabapentin at high doses, so think we have fewer options- don't want to choose Lyrica, due to him still actively depressed.    3. IF Successful with Keppra, will then discuss at next appointment- will discuss reduction of gabapentin. But not before then.   4. If it doesn't work, can try Trileptal or Low dose Naltrexone in future. Since he's describing burning pain, really need to focus on nerve pain.  Cannot use Epidural steroid injections because he's on Elqiuis for Afib.    5.  Of note, based on Neuro note, pt has no Radiculopathy based on EMG/NCS or R cervical radiculopathy. Per Neurology.   6. Give it ~ 1 month- and if not helpful AT ALL- call or if helps some, but not enough, let me know as well.    7. F/U in 3 months.

## 2021-03-09 NOTE — Progress Notes (Signed)
Subjective:    Patient ID: Seth Snyder, male    DOB: 1961/03/10, 60 y.o.   MRN: [id number]  HPI  Pt is a 60 yr old male with hx of  (no DM);  Afib?- on Amiodarone; HTN; hypothyroidism; and on Eliquis; so cannot have Epidural steroid injections; also depression and nerve/back pain; here for evaluation of neck and back pain with radiculopathy/sciatica.   The right side of body hurts from head to toe- and Both legs have a burning sensation-   Tree fell on him at age ~ 65- but didn't have pain afterwards.  Didn't have pain  until last few years.   Made worse: nothing makes it worse- it's "constant".   Made better: has a little relief with doing exercises  Tried Gabapentin 900 mg TID- doesn't feel like it's helping much-  Duloxetine 60 mg daily- a capsule- doesn't feel like it helps the pain.  Never tried Keppra/Trileptal.  Tylenol- takes 6  500 mg daily- so 3 Grams/day.  Helps a little bit- takes the edge off.  Doesn't know if has tried anything else- "doesn't remember".    MRI suggests correlating with R S1 radiculopathy- has a L5-S1 Right subarticular disc protrusion and annular fissure minimally narrowing R lateral recess; also L4/5 L lateral and mild L neural foraminal stenosis- disc bulge and annular fissure.   Cervical MRI 08/22/20 IMPRESSION: 1. Decreased size of right foraminal disc extrusion at C6-7 with improved moderate right neural foraminal stenosis. 2. Unchanged mild spinal canal stenosis and moderate right neural foraminal stenosis at C5-6. 3. 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    talked about cervical fusion, but looking better, so waited for surgery-  C5/6 to - C6/7- by Ortho spine-    Has associated back burning and headaches. Makes it hard to sleep.   Has done PT in past, didn't see any improvements after PT in past- does some HOME exercise program at home, but concerned will have to go back to PT.   Pain Inventory Average Pain  8 Pain Right Now 9 My pain is sharp, burning, dull, stabbing, tingling, and aching  In the last 24 hours, has pain interfered with the following? General activity 9 Relation with others 8 Enjoyment of life 8 What TIME of day is your pain at its worst? daytime and night Sleep (in general) Poor  Pain is worse with: walking, bending, and sitting Pain improves with: medication Relief from Meds: 4  use a walker ability to climb steps?  no do you drive?  no  disabled: date disabled 2021 bowel control problems weakness numbness tingling trouble walking dizziness confusion anxiety suicidal thoughts  Any changes since last visit?  no  Any changes since last visit?  no    Family History  Problem Relation Age of Onset   Diabetes Mother    Other Father        unsure of medical history   Diabetes Sister    Mental illness Sister    Mental illness Brother    Social History   Socioeconomic History   Marital status: Single    Spouse name: Not on file   Number of children: 1   Years of education: 8th grade   Highest education level: Not on file  Occupational History   Occupation: Disabled  Tobacco Use   Smoking status: Former    Types: Cigarettes    Quit date: 12/21/2018    Years since quitting: 2.2   Smokeless tobacco: Never  Vaping Use   Vaping Use: Never used  Substance and Sexual Activity   Alcohol use: Not Currently   Drug use: Never   Sexual activity: Not Currently  Other Topics Concern   Not on file  Social History Narrative   Right-handed.   Lives at home with his daughter and son-in-law.   No daily use of caffeine.      Social Determinants of Health   Financial Resource Strain: Medium Risk   Difficulty of Paying Living Expenses: Somewhat hard  Food Insecurity: No Food Insecurity   Worried About Charity fundraiser in the Last Year: Never true   Ran Out of Food in the Last Year: Never true  Transportation Needs: No Transportation Needs   Lack of  Transportation (Medical): No   Lack of Transportation (Non-Medical): No  Physical Activity: Not on file  Stress: Not on file  Social Connections: Not on file   Past Surgical History:  Procedure Laterality Date   CARDIOVERSION N/A 05/10/2019   Procedure: CARDIOVERSION;  Surgeon: Lelon Perla, MD;  Location: [facility] ENDOSCOPY;  Service: Cardiovascular;  Laterality: N/A;   COLONOSCOPY  07/12/2013   LEFT HEART CATH AND CORONARY ANGIOGRAPHY N/A 08/30/2019   Procedure: LEFT HEART CATH AND CORONARY ANGIOGRAPHY;  Surgeon: Jettie Booze, MD;  Location: [place] CV LAB;  Service: Cardiovascular;  Laterality: N/A;   TEE WITHOUT CARDIOVERSION N/A 05/10/2019   Procedure: TRANSESOPHAGEAL ECHOCARDIOGRAM (TEE);  Surgeon: Lelon Perla, MD;  Location: [facility] ENDOSCOPY;  Service: Cardiovascular;  Laterality: N/A;   Past Medical History:  Diagnosis Date   Anemia    Atrial fibrillation ([place])    Atypical chest pain 10/01/2020   Dizziness    Hypertension    Low back pain    There were no vitals taken for this visit.  Opioid Risk Score:   Fall Risk Score:  `1  Depression screen PHQ 2/9  Depression screen [facility] 2/9 02/16/2021 01/29/2021 01/01/2021 10/02/2020 10/08/2019 10/08/2019 09/10/2019  Decreased Interest 3 - 0 0 0 0 0  Down, Depressed, Hopeless 0 1 0 0 1 0 0  PHQ - 2 Score 3 1 0 0 1 0 0  Altered sleeping 3 1 - 0 - - -  Tired, decreased energy 3 3 - 0 - - -  Change in appetite 0 1 - 0 - - -  Feeling bad or failure about yourself  0 3 - 0 - - -  Trouble concentrating 0 0 - 0 - - -  Moving slowly or fidgety/restless 3 1 - 0 - - -  Suicidal thoughts 0 1 - 0 - - -  PHQ-9 Score 12 11 - 0 - - -  Difficult doing work/chores Somewhat difficult - - - - - -    Review of Systems  Constitutional:  Positive for appetite change.       Poor appetite  Respiratory:  Positive for shortness of breath.   Cardiovascular:  Positive for leg swelling.  Gastrointestinal:  Positive for abdominal pain,  constipation and diarrhea.  Genitourinary:  Positive for difficulty urinating.  Musculoskeletal:  Positive for gait problem.       Pain on the right side of the body. Head to foot  Neurological:  Positive for dizziness and weakness.       Tingling  Psychiatric/Behavioral:  Positive for confusion and suicidal ideas.        Anxiety  All other systems reviewed and are negative.     Objective:   Physical  Exam Awake, alert, depressed affect; using rollator to walk; NAD  MS: UE strength 5/5 in deltoids, biceps, triceps, WE< grip and FA was 5-/5 B/L  LE- HF, KE, KF DF and PF- B/L  5/5 B/L     Neuro:  No clonus; no increased tone; no hoffman's B/L  Decreased to light touch from L4- S2 on RLE  R lateral malleolus swelling/bruising- said Gait- R foot  externally rotated slightly with gait- trace antalgic gait-slightly favoring RLE- no ataxia seen.       Assessment & Plan:   Pt is a 60 yr old male with hx of  (no DM);  Afib?- on Amiodarone; HTN; hypothyroidism; and on Eliquis; so cannot have Epidural steroid injections; also depression and nerve/back pain; here for evaluation of neck and back pain with radiculopathy/sciatica.    Keppra/Levicetracem- 250 mg 2x/day x 1 week, then 500 mg 2x/day for back/nerve pain.   2. Already on Cymbalta and Gabapentin at high doses, so think we have fewer options- don't want to choose Lyrica, due to him still actively depressed.    3. IF Successful with Keppra, will then discuss at next appointment- will discuss reduction of gabapentin. But not before then.   4. If it doesn't work, can try Trileptal or Low dose Naltrexone in future. Since he's describing burning pain, really need to focus on nerve pain.  Cannot use Epidural steroid injections because he's on Elqiuis for Afib.  Unless Cardiology let him hold Eliquis.   5.  Of note, based on Neuro note, pt has no Radiculopathy based on EMG/NCS or R cervical radiculopathy. Per Neurology.   6. Give it  ~ 1 month- and if not helpful AT ALL- call or if helps some, but not enough, let me know as well.    7. F/U in 3 months.    I spent a total of 39 minutes on total visit- as detailed above- needed tele-interpretor to complete visit, which took much longer.

## 2021-04-06 ENCOUNTER — Ambulatory Visit: Payer: Medicaid Other | Admitting: Physical Medicine and Rehabilitation

## 2021-04-06 NOTE — Progress Notes (Signed)
Office Visit    Patient Name: Seth Snyder Date of Encounter: 04/07/2021  PCP:  Vevelyn Francois, NP   [place]  Cardiologist:  Skeet Latch, MD  Advanced Practice Provider:  No care team member to display Electrophysiologist:  Thompson Grayer, MD    Chief Complaint    Seth Snyder is a 61 y.o. male with a hx of atrial fibrillation, hypertension, and chronic systolic and diastolic heart failure presents today for follow-up.  Past Medical History    Past Medical History:  Diagnosis Date   Anemia    Atrial fibrillation ([place])    Atypical chest pain 10/01/2020   Dizziness    Hypertension    Low back pain    Past Surgical History:  Procedure Laterality Date   CARDIOVERSION N/A 05/10/2019   Procedure: CARDIOVERSION;  Surgeon: Lelon Perla, MD;  Location: [facility] ENDOSCOPY;  Service: Cardiovascular;  Laterality: N/A;   COLONOSCOPY  07/12/2013   LEFT HEART CATH AND CORONARY ANGIOGRAPHY N/A 08/30/2019   Procedure: LEFT HEART CATH AND CORONARY ANGIOGRAPHY;  Surgeon: Jettie Booze, MD;  Location: [place] CV LAB;  Service: Cardiovascular;  Laterality: N/A;   TEE WITHOUT CARDIOVERSION N/A 05/10/2019   Procedure: TRANSESOPHAGEAL ECHOCARDIOGRAM (TEE);  Surgeon: Lelon Perla, MD;  Location: [facility] ENDOSCOPY;  Service: Cardiovascular;  Laterality: N/A;    Allergies  Allergies  Allergen Reactions   Entresto [Sacubitril-Valsartan]     Abnormal kidney functions     History of Present Illness    Seth Snyder is a 61 y.o. male with a hx of atrial fibrillation, hypertension, and chronic systolic and diastolic heart failure presents today for follow-up last seen 09/2020 by Dr. Oval Linsey.  He was initially diagnosed with atrial fibrillation 01/2020.  He presented with hypertension and palpitations.  He had an echocardiogram 05/2019 that revealed LVEF 45 to 50% with global hypokinesis.  He underwent stress test 05/2019 which was suggestive of  apical ischemia.  He subsequently underwent a left heart cath 06/2019 that revealed normal coronaries.  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  He underwent TEE/cardioversion on 04/2019 but went back in atrial fibrillation.  He was started on amiodarone.  He is being seen by Dr. Rayann Heman.  Possible ablation was discussed for the future.  However, this was deferred due to the possible need for surgical intervention on his cervical radiculopathy.  He was seen in the ED 06/2019 with dizziness and hypotension.  Hydralazine was discontinued at this time.  He was seen by Coletta Memos, NP 07/2020 and his blood pressure was better at this time but still labile.  He was asked to discontinue his amlodipine whenever his SBP was not elevated.  At his last appointment with Dr. Oval Linsey 09/2020 he was overall feeling better.  He occasionally still felt some sharp chest pain while sitting, sleeping at night, or while exercising.  For exercise he performed some stretching at home.  He was also having some difficulty sleeping due to back pain and a burning sensation in his feet.  Today, he is having a lot of shortness of breath but his biggest worry is his low back pain and burning.  He has seen physical medicine in December and he was started on Keppra and gabapentin.  He had a few medication questions regarding the [place] which I will refer to the prescribing provider.  He does continue to have fluttering and racing of his heart rate which he notices  every other day and it lasts for few hours.  He has been to see EP and they are working on scheduling an ablation for his atrial fibrillation.  He remains on Eliquis and amiodarone 100 mg daily.  He does endorse chest pain which remains about the same.  It does get worse with exertion but also gets worse when he pushes on it.  He has had a hard time breathing or for 2 years now.  When he gets up and uses the bathroom in the middle of the night he gets short of breath and  dizzy.  Last year he had 3 or 4 syncopal episodes but none since then.  He does wake up in the middle of the night short of breath.   No edema on exam today but has had some edema in his legs back when he was working.    EKGs/Labs/Other Studies Reviewed:   The following studies were reviewed today: Echo 03/26/20:  1. Left ventricular ejection fraction, by estimation, is 60 to 65%. Left  ventricular ejection fraction by 3D volume is 61 %. The left ventricle has  normal function. The left ventricle has no regional wall motion  abnormalities. Left ventricular diastolic   parameters were normal.   2. Right ventricular systolic function is normal. The right ventricular  size is normal. There is normal pulmonary artery systolic pressure.   3. The mitral valve is normal in structure. No evidence of mitral valve  regurgitation. No evidence of mitral stenosis.   4. The aortic valve is normal in structure. Aortic valve regurgitation is  mild. No aortic stenosis is present. Aortic regurgitation PHT measures 458  msec.   5. Aortic dilatation noted. There is mild dilatation of the ascending  aorta, measuring 40 mm.   6. The inferior vena cava is normal in size with greater than 50%  respiratory variability, suggesting right atrial pressure of 3 mmHg.    LHC 08/2019: There is severe left ventricular systolic dysfunction. The left ventricular ejection fraction is less than 25% by visual estimate. LV end diastolic pressure is normal. LVEDP 12 mm Hg. There is no aortic valve stenosis. No significant CAD. Severe vasospasm of right radial artery, even with 4 Fr catheter. Would not use the right radial artery if cath was needed in the future.   Medical therapy for nonischemic cardiomyopathy.   Resume Eliquis tomorrow morning.    Lexiscan Myoview 05/2019: There was no ST segment deviation noted during stress. Study was not gated due to atrial fibrillation Defect 1: There is a small fixed defect of  mild severity present in the apex location. Defect 2: There is a small reversible defect of mild severity present in the basal inferior, mid inferior and apical inferior location. Findings consistent with ischemia and prior myocardial infarction. This is a low risk study given small area of ischemia.   Fixed defect at apex, suggests infarct.  Reversible inferior defect, suggests ischemia.   Echo 04/2019:  1. Left ventricular ejection fraction, by estimation, is 45 to 50%. The  left ventricle has mildly decreased function. The left ventricle  demonstrates global hypokinesis. Left ventricular diastolic function could  not be evaluated.   2. Right ventricular systolic function is low normal. The right  ventricular size is normal. There is mildly elevated pulmonary artery  systolic pressure.   3. The mitral valve is grossly normal. Mild mitral valve regurgitation.   4. Tricuspid valve regurgitation is mild to moderate.   5. The aortic valve  is tricuspid. Aortic valve regurgitation is mild. No  aortic stenosis is present.   6. There is mild dilatation of the ascending aorta measuring 39 mm.   7. The inferior vena cava is dilated in size with >50% respiratory  variability, suggesting right atrial pressure of 8 mmHg.     EKG:  EKG is not ordered today.   Recent Labs: 06/20/2020: B Natriuretic Peptide 42.8 01/02/2021: ALT 32; BUN 11; Creatinine, [place] 1.11; Hemoglobin 11.4; Platelets 213; Potassium 3.9; Sodium 136 01/29/2021: TSH 8.900  Recent Lipid Panel    Component Value Date/Time   CHOL 128 10/02/2020 1122   TRIG 131 10/02/2020 1122   HDL 53 10/02/2020 1122   CHOLHDL 2.4 10/02/2020 1122   CHOLHDL 2.8 05/07/2019 0823   VLDL 17 05/07/2019 0823   LDLCALC 52 10/02/2020 1122    Risk Assessment/Calculations:   CHA2DS2-VASc Score = 2   This indicates a 2.2% annual risk of stroke. The patient's score is based upon: CHF History: 1 HTN History: 1 Diabetes History: 0 Stroke History:  0 Vascular Disease History: 0 Age Score: 0 Gender Score: 0     Home Medications   Current Meds  Medication Sig   acetaminophen (TYLENOL) 500 MG tablet Take 1,000 mg by mouth daily as needed for mild pain.   albuterol (VENTOLIN HFA) 108 (90 Base) MCG/ACT inhaler Inhale 2 puffs into the lungs every 6 (six) hours as needed for wheezing or shortness of breath.   amiodarone (PACERONE) 100 MG tablet Take 1 tablet (100 mg total) by mouth daily.   amLODipine (NORVASC) 2.5 MG tablet Take 1 tablet (2.5 mg total) by mouth daily.   ANORO ELLIPTA 62.5-25 MCG/INH AEPB Inhale 1 puff into the lungs 2 (two) times daily as needed (sob/wheezing).   atorvastatin (LIPITOR) 40 MG tablet TAKE 1 TABLET (40 MG TOTAL) BY MOUTH DAILY AT 6 PM.   carvedilol (COREG) 25 MG tablet TAKE 1 TABLET BY MOUTH TWICE A DAY   citalopram (CELEXA) 40 MG tablet Take 1 tablet (40 mg total) by mouth daily.   DULoxetine (CYMBALTA) 60 MG capsule TAKE 1 CAPSULE BY MOUTH EVERY DAY   ELIQUIS 5 MG TABS tablet TAKE 1 TABLET BY MOUTH TWICE A DAY   fluticasone-salmeterol (ADVAIR HFA) 115-21 MCG/ACT inhaler Inhale 2 puffs into the lungs 2 (two) times daily.   gabapentin (NEURONTIN) 300 MG capsule Take 3 capsules (900 mg total) by mouth at bedtime.   Heating Pads ([employer] HEATING PAD) PADS 1 application by Does not apply route 3 (three) times daily. 15 minute intervals only   hydrALAZINE (APRESOLINE) 50 MG tablet TAKE 1 TABLET (50 MG TOTAL) BY MOUTH IN THE MORNING AND AT BEDTIME. (Patient taking differently: Take 50 mg by mouth 2 (two) times daily as needed (high blood pressure).)   levETIRAcetam (KEPPRA) 250 MG tablet Take 1 tablet (250 mg total) by mouth 2 (two) times daily. X 1 week, then 500 mg 2x/day- for nerve/back pain   levothyroxine (SYNTHROID) 75 MCG tablet Take 1 tablet (75 mcg total) by mouth daily.   pantoprazole (PROTONIX) 40 MG tablet Take 1 tablet (40 mg total) by mouth daily.   traZODone (DESYREL) 50 MG tablet Take half tablet at  bedtime as needed for sleep.   triamcinolone ointment (KENALOG) 0.5 % Apply 1 application topically 2 (two) times daily.     Review of Systems      All other systems reviewed and are otherwise negative except as noted above.  Physical Exam  VS:  BP 126/82 (BP Location: Left Arm, Patient Position: Sitting, Cuff Size: Normal)    Pulse 67    Ht 5\' 7"  (1.702 m)    Wt 163 lb 4.8 oz (74.1 kg)    SpO2 98%    BMI 25.58 kg/m  , BMI Body mass index is 25.58 kg/m.  Wt Readings from Last 3 Encounters:  04/07/21 163 lb 4.8 oz (74.1 kg)  03/09/21 162 lb (73.5 kg)  02/16/21 157 lb 12.8 oz (71.6 kg)     GEN: Well nourished, well developed, in no acute distress. HEENT: normal. Neck: Supple, no JVD, carotid bruits, or masses. Cardiac: RRR, no murmurs, rubs, or gallops. No clubbing, cyanosis, edema.  Radials/PT 2+ and equal bilaterally.  Respiratory:  ]Respirations regular and unlabored, clear to auscultation bilaterally. GI: Soft, nontender, nondistended. MS: No deformity or atrophy. Skin: Warm and dry, no rash. Neuro:  Strength and sensation are intact. Psych: Normal affect.  Assessment & Plan    Atrial fibrillation with RVR -Amiodarone decreased to 100 mg daily -Currently taking Eliquis 5 mg twice a day -Still having episodes every other day which last a few hours -Per EP would only discuss ablation after his significant radicular symptoms have been addressed surgically  Hypertension -Well controlled today -Encouraged the patient to take his BP at home -Encouraged low-sodium diet   Atypical chest pain -Does not seem to be associated with activity -It is uncomfortable when he pushes on his chest -Would defer ischemic work-up for now and wait for echo results   Chronic combined systolic and diastolic heart failure -Stable echocardiogram 03/26/2020 with LVEF 60 to 65% -Felt to have hypertensive cardiomyopathy -Did not tolerate Entresto -Will order repeat Echo for continued symptoms  of dyspnea on exertion, PND, and orthopnea  Hypothyroidism -Recent medication change and plans to check TSH -Most recent TSH 11/22 8.9 -Plan per PCP   6. COPD -He has been a lifelong smoker and quit 2 years ago -Currently on Advair, Ellipta and as needed albuterol.  He uses albuterol every day -If echocardiogram stable would suggest referral to pulmonary for further work-up   Disposition: Follow up 3-4 months with Skeet Latch, MD or APP. Follow-up at the end of the month with Oda Kilts, PA-C.   Signed, Elgie Collard, PA-C 04/07/2021, 2:37 PM [facility]

## 2021-04-07 ENCOUNTER — Ambulatory Visit (INDEPENDENT_AMBULATORY_CARE_PROVIDER_SITE_OTHER): Payer: Medicaid Other | Admitting: Physician Assistant

## 2021-04-07 ENCOUNTER — Other Ambulatory Visit: Payer: Self-pay

## 2021-04-07 VITALS — BP 126/82 | HR 67 | Ht 67.0 in | Wt 163.3 lb

## 2021-04-07 DIAGNOSIS — I4891 Unspecified atrial fibrillation: Secondary | ICD-10-CM

## 2021-04-07 DIAGNOSIS — I4819 Other persistent atrial fibrillation: Secondary | ICD-10-CM

## 2021-04-07 DIAGNOSIS — R0789 Other chest pain: Secondary | ICD-10-CM | POA: Diagnosis not present

## 2021-04-07 DIAGNOSIS — J449 Chronic obstructive pulmonary disease, unspecified: Secondary | ICD-10-CM

## 2021-04-07 DIAGNOSIS — I5042 Chronic combined systolic (congestive) and diastolic (congestive) heart failure: Secondary | ICD-10-CM | POA: Diagnosis not present

## 2021-04-07 DIAGNOSIS — I10 Essential (primary) hypertension: Secondary | ICD-10-CM

## 2021-04-07 NOTE — Patient Instructions (Addendum)
Medication Instructions:  Your Physician recommend you continue on your current medication as directed.    *If you need a refill on your cardiac medications before your next appointment, please call your pharmacy*   Lab Work: None ordered today    Testing/Procedures: Your physician has requested that you have an echocardiogram. Echocardiography is a painless test that uses sound waves to create images of your heart. It provides your doctor with information about the size and shape of your heart and how well your hearts chambers and valves are working. This procedure takes approximately one hour. There are no restrictions for this procedure. [address]     Follow-Up: At [facility], you and your health needs are our priority.  As part of our continuing mission to provide you with exceptional heart care, we have created designated Provider Care Teams.  These Care Teams include your primary Cardiologist (physician) and Advanced Practice Providers (APPs -  Physician Assistants and Nurse Practitioners) who all work together to provide you with the care you need, when you need it.  We recommend signing up for the patient portal called "MyChart".  Sign up information is provided on this After Visit Summary.  MyChart is used to connect with patients for Virtual Visits (Telemedicine).  Patients are able to view lab/test results, encounter notes, upcoming appointments, etc.  Non-urgent messages can be sent to your provider as well.   To learn more about what you can do with MyChart, go to [email or web address].    Your next appointment:   Please Keep your appointment with EP on 1/30   3-4 month(s) with Dr. Duke Salvia   The format for your next appointment:   In Person  Provider:   Chilton Si, MD

## 2021-04-08 ENCOUNTER — Ambulatory Visit: Payer: Self-pay | Admitting: Nurse Practitioner

## 2021-04-13 ENCOUNTER — Ambulatory Visit: Payer: Medicaid Other | Admitting: Physical Medicine and Rehabilitation

## 2021-04-16 ENCOUNTER — Other Ambulatory Visit: Payer: Self-pay

## 2021-04-16 ENCOUNTER — Ambulatory Visit (INDEPENDENT_AMBULATORY_CARE_PROVIDER_SITE_OTHER): Payer: Medicaid Other

## 2021-04-16 DIAGNOSIS — I4819 Other persistent atrial fibrillation: Secondary | ICD-10-CM

## 2021-04-16 LAB — ECHOCARDIOGRAM COMPLETE
AR max vel: 2.94 cm2
AV Area VTI: 2.81 cm2
AV Area mean vel: 2.6 cm2
AV Mean grad: 4 mmHg
AV Peak grad: 7.7 mmHg
AV Vena cont: 0.35 cm
Ao pk vel: 1.39 m/s
Area-P 1/2: 2.96 cm2
Calc EF: 58.9 %
P 1/2 time: 530 msec
S' Lateral: 2.82 cm
Single Plane A2C EF: 56 %
Single Plane A4C EF: 59.3 %

## 2021-04-17 NOTE — Progress Notes (Signed)
PCP:  Vevelyn Francois, NP Primary Cardiologist: Skeet Latch, MD Electrophysiologist: Thompson Grayer, MD   Seth Snyder is a 61 y.o. male seen today for Thompson Grayer, MD for routine electrophysiology followup.  Since last being seen in our clinic the patient reports doing OK. Continues to struggle with significant cervical and lumbar radiculopathy.  he denies any recent chest pain (last time over 2 years ago), palpitations, dyspnea, PND, orthopnea, nausea, vomiting, dizziness, syncope, edema, weight gain, or early satiety.  Past Medical History:  Diagnosis Date   Anemia    Atrial fibrillation ([place])    Atypical chest pain 10/01/2020   Dizziness    Hypertension    Low back pain    Past Surgical History:  Procedure Laterality Date   CARDIOVERSION N/A 05/10/2019   Procedure: CARDIOVERSION;  Surgeon: Lelon Perla, MD;  Location: [facility] ENDOSCOPY;  Service: Cardiovascular;  Laterality: N/A;   COLONOSCOPY  07/12/2013   LEFT HEART CATH AND CORONARY ANGIOGRAPHY N/A 08/30/2019   Procedure: LEFT HEART CATH AND CORONARY ANGIOGRAPHY;  Surgeon: Jettie Booze, MD;  Location: [place] CV LAB;  Service: Cardiovascular;  Laterality: N/A;   TEE WITHOUT CARDIOVERSION N/A 05/10/2019   Procedure: TRANSESOPHAGEAL ECHOCARDIOGRAM (TEE);  Surgeon: Lelon Perla, MD;  Location: [facility] ENDOSCOPY;  Service: Cardiovascular;  Laterality: N/A;    Current Outpatient Medications  Medication Sig Dispense Refill   acetaminophen (TYLENOL) 500 MG tablet Take 1,000 mg by mouth daily as needed for mild pain.     albuterol (VENTOLIN HFA) 108 (90 Base) MCG/ACT inhaler Inhale 2 puffs into the lungs every 6 (six) hours as needed for wheezing or shortness of breath. 8 g 6   ANORO ELLIPTA 62.5-25 MCG/INH AEPB Inhale 1 puff into the lungs 2 (two) times daily as needed (sob/wheezing).     citalopram (CELEXA) 40 MG tablet Take 1 tablet (40 mg total) by mouth daily. 90 tablet 1   DULoxetine (CYMBALTA) 60 MG capsule  TAKE 1 CAPSULE BY MOUTH EVERY DAY 90 capsule 3   fluticasone-salmeterol (ADVAIR HFA) 115-21 MCG/ACT inhaler Inhale 2 puffs into the lungs 2 (two) times daily. 1 each 12   gabapentin (NEURONTIN) 300 MG capsule Take 3 capsules (900 mg total) by mouth at bedtime. 270 capsule 3   Heating Pads ([employer] HEATING PAD) PADS 1 application by Does not apply route 3 (three) times daily. 15 minute intervals only 1 each 0   hydrALAZINE (APRESOLINE) 50 MG tablet TAKE 1 TABLET (50 MG TOTAL) BY MOUTH IN THE MORNING AND AT BEDTIME. (Patient taking differently: Take 50 mg by mouth 2 (two) times daily as needed (high blood pressure).) 180 tablet 3   levETIRAcetam (KEPPRA) 250 MG tablet Take 1 tablet (250 mg total) by mouth 2 (two) times daily. X 1 week, then 500 mg 2x/day- for nerve/back pain 120 tablet 5   levothyroxine (SYNTHROID) 75 MCG tablet Take 1 tablet (75 mcg total) by mouth daily. 30 tablet 11   pantoprazole (PROTONIX) 40 MG tablet Take 1 tablet (40 mg total) by mouth daily. 90 tablet 3   traZODone (DESYREL) 50 MG tablet Take half tablet at bedtime as needed for sleep. 90 tablet 1   triamcinolone ointment (KENALOG) 0.5 % Apply 1 application topically 2 (two) times daily. 30 g 0   amLODipine (NORVASC) 2.5 MG tablet Take 1 tablet (2.5 mg total) by mouth daily. 90 tablet 3   apixaban (ELIQUIS) 5 MG TABS tablet Take 1 tablet (5 mg total) by mouth 2 (two)  times daily. 180 tablet 1   atorvastatin (LIPITOR) 40 MG tablet Take 1 tablet (40 mg total) by mouth daily at 6 PM. 90 tablet 3   carvedilol (COREG) 25 MG tablet Take 1 tablet (25 mg total) by mouth 2 (two) times daily. 180 tablet 3   No current facility-administered medications for this visit.    Allergies  Allergen Reactions   Entresto [Sacubitril-Valsartan]     Abnormal kidney functions     Social History   Socioeconomic History   Marital status: Single    Spouse name: Not on file   Number of children: 1   Years of education: 8th grade   Highest  education level: Not on file  Occupational History   Occupation: Disabled  Tobacco Use   Smoking status: Former    Types: Cigarettes    Quit date: 12/21/2018    Years since quitting: 2.3   Smokeless tobacco: Never  Vaping Use   Vaping Use: Never used  Substance and Sexual Activity   Alcohol use: Yes    Comment: rare   Drug use: Never   Sexual activity: Not Currently  Other Topics Concern   Not on file  Social History Narrative   Right-handed.   Lives at home with his daughter and son-in-law.   No daily use of caffeine.      Social Determinants of Health   Financial Resource Strain: Medium Risk   Difficulty of Paying Living Expenses: Somewhat hard  Food Insecurity: No Food Insecurity   Worried About Charity fundraiser in the Last Year: Never true   Ran Out of Food in the Last Year: Never true  Transportation Needs: No Transportation Needs   Lack of Transportation (Medical): No   Lack of Transportation (Non-Medical): No  Physical Activity: Not on file  Stress: Not on file  Social Connections: Not on file  Intimate Partner Violence: Not on file     Review of Systems: All other systems reviewed and are otherwise negative except as noted above.  Physical Exam: Vitals:   04/20/21 1007  BP: 124/84  Pulse: (!) 59  SpO2: 94%  Weight: 161 lb 12.8 oz (73.4 kg)  Height: 5\' 7"  (1.702 m)    GEN- The patient is well appearing, alert and oriented x 3 today.   HEENT: normocephalic, atraumatic; sclera clear, conjunctiva pink; hearing intact; oropharynx clear; neck supple, no JVP Lymph- no cervical lymphadenopathy Lungs- Clear to ausculation bilaterally, normal work of breathing.  No wheezes, rales, rhonchi Heart- Regular rate and rhythm, no murmurs, rubs or gallops, PMI not laterally displaced GI- soft, non-tender, non-distended, bowel sounds present, no hepatosplenomegaly Extremities- no clubbing, cyanosis, or edema; DP/PT/radial pulses 2+ bilaterally MS- no significant  deformity or atrophy Skin- warm and dry, no rash or lesion Psych- euthymic mood, full affect Neuro- strength and sensation are intact  EKG is ordered. Personal review of EKG from today shows NSR/sinus brady at 59 bpm with QTc 481 ms  Additional studies reviewed include: Previous EP and AF office notes.   Assessment and Plan:  1. Persistent atrial fibrillation/ atrial flutter The patient has had symptomatic, recurrent atrial fibrillation. Currently maintaing NSR on amiodarone the majority of the time.  I do not think he will ever be a candidate for ablation given his significant, refractory radiculopathy (per Dr. Rayann Heman notes would need to be managed surgically prior to ablation consideration.) Stop amiodarone. Long discussion today and he would like to attempt to change to tikosyn now that he has  insurance.  If his QT does not shore up a little off amiodarone, we may ultimately have to restart amio and avoid tikosyn. Pre amio, his QTc appears to have been 440-450. As previously discussed, would only discuss ablation after his significant radicular symptoms has been addressed surgically.   CHA2DS2/VASc is at least 3.    2. HTN Stable on current regimen    3. Chronic systolic and diastolic dysfunction Echo 04/16/2021 EF 60-65%  Stable Per Dr Oval Linsey (note reviewed), felt to have a hypertensive cardiomyopathy Did not tolerate entresto. Not ICD candidate with EF normalized.   4. Hypothyroid Surveillance labs today.   Follow up with AF clinic in 6 weeks to consider/time transition to amiodarone.   Shirley Friar, PA-C  04/20/21 10:50 AM

## 2021-04-20 ENCOUNTER — Encounter: Payer: Self-pay | Admitting: Student

## 2021-04-20 ENCOUNTER — Other Ambulatory Visit: Payer: Self-pay

## 2021-04-20 ENCOUNTER — Ambulatory Visit: Payer: Medicaid Other | Admitting: Student

## 2021-04-20 VITALS — BP 124/84 | HR 59 | Ht 67.0 in | Wt 161.8 lb

## 2021-04-20 DIAGNOSIS — I5042 Chronic combined systolic (congestive) and diastolic (congestive) heart failure: Secondary | ICD-10-CM | POA: Diagnosis not present

## 2021-04-20 DIAGNOSIS — I10 Essential (primary) hypertension: Secondary | ICD-10-CM | POA: Diagnosis not present

## 2021-04-20 DIAGNOSIS — I4819 Other persistent atrial fibrillation: Secondary | ICD-10-CM | POA: Diagnosis not present

## 2021-04-20 MED ORDER — CARVEDILOL 25 MG PO TABS: 25.0000 mg | ORAL_TABLET | Freq: Two times a day (BID) | ORAL | 3 refills | Status: AC

## 2021-04-20 MED ORDER — ATORVASTATIN CALCIUM 40 MG PO TABS: 40.0000 mg | ORAL_TABLET | Freq: Every day | ORAL | 3 refills | Status: AC

## 2021-04-20 MED ORDER — AMLODIPINE BESYLATE 2.5 MG PO TABS: 2.5000 mg | ORAL_TABLET | Freq: Every day | ORAL | 3 refills | Status: DC

## 2021-04-20 MED ORDER — APIXABAN 5 MG PO TABS: 5.0000 mg | ORAL_TABLET | Freq: Two times a day (BID) | ORAL | 1 refills | Status: DC

## 2021-04-20 NOTE — Patient Instructions (Signed)
Medication Instructions:  Your physician has recommended you make the following change in your medication:   DISCONTINUE: Amiodarone  *If you need a refill on your cardiac medications before your next appointment, please call your pharmacy*   Lab Work: None  If you have labs (blood work) drawn today and your tests are completely normal, you will receive your results only by: MyChart Message (if you have MyChart) OR A paper copy in the mail If you have any lab test that is abnormal or we need to change your treatment, we will call you to review the results.   Follow-Up: At [facility], you and your health needs are our priority.  As part of our continuing mission to provide you with exceptional heart care, we have created designated Provider Care Teams.  These Care Teams include your primary Cardiologist (physician) and Advanced Practice Providers (APPs -  Physician Assistants and Nurse Practitioners) who all work together to provide you with the care you need, when you need it.  We recommend signing up for the patient portal called "MyChart".  Sign up information is provided on this After Visit Summary.  MyChart is used to connect with patients for Virtual Visits (Telemedicine).  Patients are able to view lab/test results, encounter notes, upcoming appointments, etc.  Non-urgent messages can be sent to your provider as well.   To learn more about what you can do with MyChart, go to [email or web address].    Your next appointment:   6 week(s) (to consider Tikosyn)  The format for your next appointment:   In Person  Provider:   You will follow up in the Atrial Fibrillation Clinic located at [facility]. Your provider will be: Rudi Coco, NP

## 2021-04-27 ENCOUNTER — Ambulatory Visit: Payer: Medicaid Other | Admitting: Internal Medicine

## 2021-05-08 ENCOUNTER — Ambulatory Visit: Payer: Medicaid Other | Admitting: Orthopaedic Surgery

## 2021-05-12 ENCOUNTER — Other Ambulatory Visit: Payer: Self-pay

## 2021-05-12 ENCOUNTER — Ambulatory Visit (INDEPENDENT_AMBULATORY_CARE_PROVIDER_SITE_OTHER): Payer: Medicaid Other | Admitting: Orthopaedic Surgery

## 2021-05-12 ENCOUNTER — Encounter: Payer: Self-pay | Admitting: Orthopaedic Surgery

## 2021-05-12 VITALS — BP 114/74 | HR 58 | Ht 67.0 in | Wt 158.0 lb

## 2021-05-12 DIAGNOSIS — M5020 Other cervical disc displacement, unspecified cervical region: Secondary | ICD-10-CM | POA: Diagnosis not present

## 2021-05-12 DIAGNOSIS — M25551 Pain in right hip: Secondary | ICD-10-CM | POA: Diagnosis not present

## 2021-05-12 NOTE — Progress Notes (Signed)
Office Visit Note   Patient: Seth Snyder           Date of Birth: December 05, 1960           MRN: [id number] Visit Date: 05/12/2021              Requested by: Barbette Merino, NP [address] [place],  [place] [address] PCP: Barbette Merino, NP   Assessment & Plan: Visit Diagnoses:  1. Protrusion of cervical intervertebral disc   2. Right hip pain     Plan: Last cervical MRI 08/22/2020 showed improvement of the foraminal disc extrusion C6-7 on the right.  Unchanged moderate foraminal stenosis C5-6 and mild central stenosis.  Hip radiograph showed hip osteoarthritis.  He will call him when either area becomes much more symptomatic enough to consider surgical intervention but currently states he is doing better and wants to continue with the medications he is taking.  Follow-Up Instructions: No follow-ups on file.   Orders:  No orders of the defined types were placed in this encounter.  No orders of the defined types were placed in this encounter.     Procedures: No procedures performed   Clinical Data: No additional findings.   Subjective: Chief Complaint  Patient presents with   Neck - Pain, Follow-up   Right Hip - Follow-up, Pain   Lower Back - Follow-up, Pain    HPI 61 year old male here with distribution right arm pain.  States tingling in hand is better he still has pain in his shoulder.  He also has problems with right hip osteoarthritis but states has been a little bit better.  He was placed on some Neurontin and has been using a cane.  We have previously discussed hip arthroplasty as well as cervical disc surgery if his symptoms got worse.  Review of Systems positive for atrial fibrillation major depression.  Cervical disc protrusion right hip osteoarthritis all of system review are noncontributory HPI.   Objective: Vital Signs: BP 114/74    Pulse (!) 58    Ht 5\' 7"  (1.702 m)    Wt 158 lb (71.7 kg)    BMI 24.75 kg/m   Physical Exam Constitutional:       Appearance: He is well-developed.  HENT:     Head: Normocephalic and atraumatic.     Right Ear: External ear normal.     Left Ear: External ear normal.  Eyes:     Pupils: Pupils are equal, round, and reactive to light.  Neck:     Thyroid: No thyromegaly.     Trachea: No tracheal deviation.  Cardiovascular:     Rate and Rhythm: Normal rate.  Pulmonary:     Effort: Pulmonary effort is normal.     Breath sounds: No wheezing.  Abdominal:     General: Bowel sounds are normal.     Palpations: Abdomen is soft.  Musculoskeletal:     Cervical back: Neck supple.  Skin:    General: Skin is warm and dry.     Capillary Refill: Capillary refill takes less than 2 seconds.  Neurological:     Mental Status: He is alert and oriented to person, place, and time.  Psychiatric:        Behavior: Behavior normal.        Thought Content: Thought content normal.        Judgment: Judgment normal.    Ortho Exam reproduce pain with 30 degrees internal rotation right hip no pain with  internal/external rotation left hip.  Brachial plexus tenderness on the right side.  Reflexes are intact no interosseous atrophy.  Elbow reaches full extension.  Specialty Comments:  No specialty comments available.  Imaging: No results found.   PMFS History: Patient Active Problem List   Diagnosis Date Noted   Nerve pain 03/09/2021   Atypical chest pain 10/01/2020   Chronic kidney disease 09/18/2020   MDD (major depressive disorder), recurrent, in partial remission (HCC) 09/11/2020   Gait abnormality 07/29/2020   Neck pain, chronic 07/29/2020   Urinary incontinence 07/29/2020   Right hip pain 07/29/2020   MDD (major depressive disorder), recurrent episode, moderate (HCC) 04/15/2020   Persistent atrial fibrillation (HCC) 09/20/2019   Secondary hypercoagulable state (HCC) 09/20/2019   Abnormal nuclear stress test    Spinal stenosis of cervical region 08/13/2019   Protrusion of cervical intervertebral disc  08/13/2019   Shoulder pain    Chronic combined systolic and diastolic heart failure (HCC)    Atrial fibrillation with RVR (HCC) 05/07/2019   Hypothyroidism 05/07/2019   Hypomagnesemia 05/07/2019   Elevated troponin 05/07/2019   Anticoagulated 03/12/2019   Dry skin dermatitis 03/12/2019   Muscle spasm of right shoulder 03/12/2019   Peripheral edema 03/12/2019   Atrial fibrillation (HCC) 03/12/2019   Shifting sleep-work schedule, affecting sleep 03/31/2018   Tobacco abuse 03/31/2018   Balanitis 03/20/2018   Hx of gonorrhea 11/23/2017   Anemia 06/27/2015   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   H/O colonoscopy 07/12/2013   Atopic dermatitis 04/18/2013   Venous insufficiency of both lower extremities 04/12/2013   Chronic bilateral low back pain with bilateral sciatica 04/12/2013   Past Medical History:  Diagnosis Date   Anemia    Atrial fibrillation (HCC)    Atypical chest pain 10/01/2020   Dizziness    Hypertension    Low back pain     Family History  Problem Relation Age of Onset   Diabetes Mother    Other Father        unsure of medical history   Diabetes Sister    Mental illness Sister    Mental illness Brother     Past Surgical History:  Procedure Laterality Date   CARDIOVERSION N/A 05/10/2019   Procedure: CARDIOVERSION;  Surgeon: Lewayne Bunting, MD;  Location: [facility] ENDOSCOPY;  Service: Cardiovascular;  Laterality: N/A;   COLONOSCOPY  07/12/2013   LEFT HEART CATH AND CORONARY ANGIOGRAPHY N/A 08/30/2019   Procedure: LEFT HEART CATH AND CORONARY ANGIOGRAPHY;  Surgeon: Corky Crafts, MD;  Location: [facility] INVASIVE CV LAB;  Service: Cardiovascular;  Laterality: N/A;   TEE WITHOUT CARDIOVERSION N/A 05/10/2019   Procedure: TRANSESOPHAGEAL ECHOCARDIOGRAM (TEE);  Surgeon: Lewayne Bunting, MD;  Location: [facility] ENDOSCOPY;  Service: Cardiovascular;  Laterality: N/A;   Social History   Occupational History   Occupation: Disabled  Tobacco Use   Smoking status: Former    Types:  Cigarettes    Quit date: 12/21/2018    Years since quitting: 2.3   Smokeless tobacco: Never  Vaping Use   Vaping Use: Never used  Substance and Sexual Activity   Alcohol use: Yes    Comment: rare   Drug use: Never   Sexual activity: Not Currently

## 2021-05-20 ENCOUNTER — Other Ambulatory Visit: Payer: Self-pay

## 2021-05-20 ENCOUNTER — Encounter: Payer: Self-pay | Admitting: Nurse Practitioner

## 2021-05-20 ENCOUNTER — Ambulatory Visit (INDEPENDENT_AMBULATORY_CARE_PROVIDER_SITE_OTHER): Payer: Medicaid Other | Admitting: Nurse Practitioner

## 2021-05-20 VITALS — BP 154/104 | HR 100 | Temp 98.2°F | Ht 67.0 in | Wt 159.4 lb

## 2021-05-20 DIAGNOSIS — D649 Anemia, unspecified: Secondary | ICD-10-CM

## 2021-05-20 DIAGNOSIS — Z1159 Encounter for screening for other viral diseases: Secondary | ICD-10-CM | POA: Diagnosis not present

## 2021-05-20 DIAGNOSIS — E039 Hypothyroidism, unspecified: Secondary | ICD-10-CM

## 2021-05-20 DIAGNOSIS — H7292 Unspecified perforation of tympanic membrane, left ear: Secondary | ICD-10-CM

## 2021-05-20 DIAGNOSIS — L299 Pruritus, unspecified: Secondary | ICD-10-CM

## 2021-05-20 DIAGNOSIS — Z13220 Encounter for screening for lipoid disorders: Secondary | ICD-10-CM

## 2021-05-20 DIAGNOSIS — Z1211 Encounter for screening for malignant neoplasm of colon: Secondary | ICD-10-CM

## 2021-05-20 DIAGNOSIS — R0981 Nasal congestion: Secondary | ICD-10-CM

## 2021-05-20 MED ORDER — HYDROXYZINE HCL 10 MG PO TABS: 10.0000 mg | ORAL_TABLET | Freq: Three times a day (TID) | ORAL | 0 refills | Status: DC | PRN

## 2021-05-20 NOTE — Progress Notes (Signed)
[place], [place]  [address] Phone:  [phone]   Fax:  [phone]   Established Patient Office Visit  Subjective:  Patient ID: Seth Snyder, male    DOB: 12/04/1960  Age: 61 y.o. MRN: [id number]  CC:  Chief Complaint  Patient presents with   Follow-up    Pt is here today for his 3 month follow up visit and would like to discuss the rash he has on his right hand and the left side of his left outer thigh. Pt states that the rash is very itchy and had been going on x 2 days.    HPI [facility] presents for follow up. He  has a past medical history of Anemia, Atrial fibrillation ([place]), Atypical chest pain (10/01/2020), Dizziness, Hypertension, and Low back pain.   Rash Patient presents for evaluation of a rash involving the hand, lower extremity, and generalized including scalp . Rash started 3 days ago. He started with an itch that lesions are seen after  scratching. bloody, and flat in texture. Rash has changed over time. Rash is pruritic. Associated symptoms:  nasal congestion and nasal  . He has some yellow discharge in the morning. Patient denies: abdominal pain, arthralgia, fever, headache, irritability, nausea, sore throat, and vomiting. Patient has not had contacts with similar rash. Patient has not had new exposures (soaps, lotions, laundry detergents, foods, medications, plants, insects or animals). He has Neopsorin tried.    He continues to have neck pain. He has been released from ortho. He was instructed to call back if desires surgery. After several years of ongoing desire to have surgery. He is now fearful and is going to continue on the Gabapentin.   He continues to follow up with cardiology for his hypertension. He reports compliance but has noticed BP is fluctuating more on the higher. He reports that he has had a change in hs medication recently.     Past Medical History:  Diagnosis Date   Anemia    Atrial  fibrillation ([place])    Atypical chest pain 10/01/2020   Dizziness    Hypertension    Low back pain     Past Surgical History:  Procedure Laterality Date   CARDIOVERSION N/A 05/10/2019   Procedure: CARDIOVERSION;  Surgeon: Lelon Perla, MD;  Location: [facility] ENDOSCOPY;  Service: Cardiovascular;  Laterality: N/A;   COLONOSCOPY  07/12/2013   LEFT HEART CATH AND CORONARY ANGIOGRAPHY N/A 08/30/2019   Procedure: LEFT HEART CATH AND CORONARY ANGIOGRAPHY;  Surgeon: Jettie Booze, MD;  Location: [facility] CV LAB;  Service: Cardiovascular;  Laterality: N/A;   TEE WITHOUT CARDIOVERSION N/A 05/10/2019   Procedure: TRANSESOPHAGEAL ECHOCARDIOGRAM (TEE);  Surgeon: Lelon Perla, MD;  Location: [facility] ENDOSCOPY;  Service: Cardiovascular;  Laterality: N/A;    Family History  Problem Relation Age of Onset   Diabetes Mother    Other Father        unsure of medical history   Diabetes Sister    Mental illness Sister    Mental illness Brother     Social History   Socioeconomic History   Marital status: Single    Spouse name: Not on file   Number of children: 1   Years of education: 8th grade   Highest education level: Not on file  Occupational History   Occupation: Disabled  Tobacco Use   Smoking status: Former    Types: Cigarettes    Quit date: 12/21/2018  Years since quitting: 2.4   Smokeless tobacco: Never  Vaping Use   Vaping Use: Never used  Substance and Sexual Activity   Alcohol use: Not Currently    Comment: rare   Drug use: Never   Sexual activity: Not Currently  Other Topics Concern   Not on file  Social History Narrative   Right-handed.   Lives at home with his daughter and son-in-law.   No daily use of caffeine.      Social Determinants of Health   Financial Resource Strain: Medium Risk   Difficulty of Paying Living Expenses: Somewhat hard  Food Insecurity: No Food Insecurity   Worried About Charity fundraiser in the Last Year: Never true   Ran Out of  Food in the Last Year: Never true  Transportation Needs: No Transportation Needs   Lack of Transportation (Medical): No   Lack of Transportation (Non-Medical): No  Physical Activity: Not on file  Stress: Not on file  Social Connections: Not on file  Intimate Partner Violence: Not on file    Outpatient Medications Prior to Visit  Medication Sig Dispense Refill   acetaminophen (TYLENOL) 500 MG tablet Take 1,000 mg by mouth daily as needed for mild pain.     albuterol (VENTOLIN HFA) 108 (90 Base) MCG/ACT inhaler Inhale 2 puffs into the lungs every 6 (six) hours as needed for wheezing or shortness of breath. 8 g 6   amLODipine (NORVASC) 2.5 MG tablet Take 1 tablet (2.5 mg total) by mouth daily. 90 tablet 3   ANORO ELLIPTA 62.5-25 MCG/INH AEPB Inhale 1 puff into the lungs 2 (two) times daily as needed (sob/wheezing).     apixaban (ELIQUIS) 5 MG TABS tablet Take 1 tablet (5 mg total) by mouth 2 (two) times daily. 180 tablet 1   atorvastatin (LIPITOR) 40 MG tablet Take 1 tablet (40 mg total) by mouth daily at 6 PM. 90 tablet 3   carvedilol (COREG) 25 MG tablet Take 1 tablet (25 mg total) by mouth 2 (two) times daily. 180 tablet 3   citalopram (CELEXA) 40 MG tablet Take 1 tablet (40 mg total) by mouth daily. 90 tablet 1   DULoxetine (CYMBALTA) 60 MG capsule TAKE 1 CAPSULE BY MOUTH EVERY DAY 90 capsule 3   fluticasone-salmeterol (ADVAIR HFA) 115-21 MCG/ACT inhaler Inhale 2 puffs into the lungs 2 (two) times daily. 1 each 12   gabapentin (NEURONTIN) 300 MG capsule Take 3 capsules (900 mg total) by mouth at bedtime. 270 capsule 3   Heating Pads ([employer] HEATING PAD) PADS 1 application by Does not apply route 3 (three) times daily. 15 minute intervals only 1 each 0   hydrALAZINE (APRESOLINE) 50 MG tablet TAKE 1 TABLET (50 MG TOTAL) BY MOUTH IN THE MORNING AND AT BEDTIME. (Patient taking differently: Take 50 mg by mouth 2 (two) times daily as needed (high blood pressure).) 180 tablet 3   levETIRAcetam  (KEPPRA) 250 MG tablet Take 1 tablet (250 mg total) by mouth 2 (two) times daily. X 1 week, then 500 mg 2x/day- for nerve/back pain 120 tablet 5   levothyroxine (SYNTHROID) 75 MCG tablet Take 1 tablet (75 mcg total) by mouth daily. 30 tablet 11   pantoprazole (PROTONIX) 40 MG tablet Take 1 tablet (40 mg total) by mouth daily. 90 tablet 3   traZODone (DESYREL) 50 MG tablet Take half tablet at bedtime as needed for sleep. 90 tablet 1   triamcinolone ointment (KENALOG) 0.5 % Apply 1 application topically 2 (two) times  daily. 30 g 0   No facility-administered medications prior to visit.    Allergies  Allergen Reactions   Entresto [Sacubitril-Valsartan]     Abnormal kidney functions     ROS Review of Systems    Objective:    Physical Exam Constitutional:      General: He is not in acute distress. HENT:     Head: Normocephalic and atraumatic.     Ears:     Comments: Ruptured TM    Nose: Nose normal.     Mouth/Throat:     Mouth: Mucous membranes are moist.  Cardiovascular:     Rate and Rhythm: Normal rate and regular rhythm.     Pulses: Normal pulses.     Heart sounds: Normal heart sounds.  Pulmonary:     Effort: Pulmonary effort is normal.     Breath sounds: Normal breath sounds.  Musculoskeletal:     Cervical back: Normal range of motion.     Right lower leg: No edema.     Left lower leg: No edema.     Comments: Cane in use  Skin:    General: Skin is warm and dry.     Capillary Refill: Capillary refill takes less than 2 seconds.     Findings: Erythema (related to stratching) present.  Neurological:     General: No focal deficit present.     Mental Status: He is alert and oriented to person, place, and time.  Psychiatric:        Mood and Affect: Mood normal.        Behavior: Behavior normal.        Thought Content: Thought content normal.        Judgment: Judgment normal.    BP (!) 154/104 (BP Location: Left Arm, Cuff Size: Normal)    Pulse 100    Temp 98.2 F  (36.8 C)    Ht 5' 7"  (1.702 m)    Wt 159 lb 6.4 oz (72.3 kg)    SpO2 99%    BMI 24.97 kg/m  Wt Readings from Last 3 Encounters:  05/20/21 159 lb 6.4 oz (72.3 kg)  05/12/21 158 lb (71.7 kg)  04/20/21 161 lb 12.8 oz (73.4 kg)     Health Maintenance Due  Topic Date Due   Hepatitis C Screening  Never done   COLONOSCOPY (Pts 45-69yr Insurance coverage will need to be confirmed)  Never done    There are no preventive care reminders to display for this patient.  Lab Results  Component Value Date   TSH 8.900 (H) 01/29/2021   Lab Results  Component Value Date   WBC 5.9 01/02/2021   HGB 11.4 (L) 01/02/2021   HCT 36.4 (L) 01/02/2021   MCV 93.1 01/02/2021   PLT 213 01/02/2021   Lab Results  Component Value Date   NA 136 01/02/2021   K 3.9 01/02/2021   CO2 24 01/02/2021   GLUCOSE 127 (H) 01/02/2021   BUN 11 01/02/2021   CREATININE 1.11 01/02/2021   BILITOT 0.8 01/02/2021   ALKPHOS 77 01/02/2021   AST 32 01/02/2021   ALT 32 01/02/2021   PROT 6.8 01/02/2021   ALBUMIN 3.7 01/02/2021   CALCIUM 9.1 01/02/2021   ANIONGAP 6 01/02/2021   EGFR 56 (L) 10/02/2020   Lab Results  Component Value Date   CHOL 128 10/02/2020   Lab Results  Component Value Date   HDL 53 10/02/2020   Lab Results  Component Value Date   LDLCALC 52  10/02/2020   Lab Results  Component Value Date   TRIG 131 10/02/2020   Lab Results  Component Value Date   CHOLHDL 2.4 10/02/2020   No results found for: HGBA1C    Assessment & Plan:   Problem List Items Addressed This Visit       Endocrine   Hypothyroidism - Primary Stable reevaluation pending   Relevant Orders   TSH   T3   T4, free   Comp. Metabolic Panel (12)     Other   Anemia Reevaluation   Relevant Orders   CBC with Differential/Platelet   Other Visit Diagnoses     Nasal congestion     Persistent  Will continue to monitor no additional treatment due to evaluated BP   Encounter for hepatitis C screening test for low risk  patient       Relevant Orders   Hepatitis C antibody   Colon cancer screening       Relevant Orders   Ambulatory referral to Gastroenterology   Screening for cholesterol level       Relevant Orders   Lipid panel   Ruptured ear drum, left     Ongoing ENT for further evaluation   Relevant Orders   Ambulatory referral to ENT   Pruritus  Persistent  Trial  hydroxyzine maybe related to anxiety.    Relevant Medications   hydrOXYzine (ATARAX) 10 MG tablet       Meds ordered this encounter  Medications   DISCONTD: hydrOXYzine (ATARAX) 10 MG tablet    Sig: Take 1 tablet (10 mg total) by mouth 3 (three) times daily as needed.    Dispense:  30 tablet    Refill:  0    Order Specific Question:   Supervising Provider    Answer:   Tresa Garter [[id number]]   hydrOXYzine (ATARAX) 10 MG tablet    Sig: Take 1 tablet (10 mg total) by mouth 3 (three) times daily as needed. For itch    Dispense:  30 tablet    Refill:  0    Order Specific Question:   Supervising Provider    Answer:   Tresa Garter [id number]    Follow-up: Return in about 3 months (around 08/20/2021) for hypothyroidism 99213.    Vevelyn Francois, NP

## 2021-05-20 NOTE — Patient Instructions (Addendum)
Eardrum Rupture, Adult An eardrum rupture is a hole (perforation) in the eardrum. The eardrum is a thin, round tissue inside of the ear that separates the ear canal from the middle ear. The eardrum is also called the tympanic membrane. It transfers sound vibrations through small bones in the middle ear to the hearing nerve in the inner ear. It also protects the middle ear from germs. An eardrum rupture can cause pain and hearing loss. What are the causes? This condition may be caused by: An infection. A sudden injury, such as from: Inserting a thin, sharp object into the ear. A hit to the side of the head, especially by an open hand. Falling onto water or a flat surface. A rapid change in pressure, such as from flying or scuba diving. A sudden increase in pressure against the eardrum, such as from an explosion or a very loud noise. Inserting a cotton-tipped swab in the ear. A long-term eustachian tube disorder. Eustachian tubes are parts of the body that connect each middle ear space to the back of the nose. A medical procedure or surgery, such as a procedure to remove wax from the ear canal. Removing a pressure equalization tube(PE tube) that was surgically placed through the eardrum. Having a PE tube fall out. What increases the risk? You are more likely to develop this condition if: You have had PE tubes inserted in your ears. You have an ear infection. You play sports that: Involve balls or contact with other players. Take place in water, such as diving, scuba diving, or waterskiing. What are the signs or symptoms? Symptoms of this condition include: Sudden pain at the time of the injury. Ear pain that suddenly improves. Ringing in the ear after the injury. Drainage from the ear. The drainage may be clear, cloudy or pus-like, or bloody. Hearing loss. Dizziness. How is this diagnosed? This condition is diagnosed based on your symptoms and medical history as well as a physical exam.  Your health care provider can usually see a perforation using an ear scope (otoscope). You may have tests, such as: A hearing test (audiogram) to check for hearing loss. A test in which a sample of ear drainage is tested for infection (culture). How is this treated? An eardrum typically heals on its own within a few weeks. If your eardrum does not heal, your health care provider may recommend a procedure to place a patch over your eardrum or surgery to repair your eardrum. Your health care provider may also prescribe antibiotic medicines to help prevent infection. If the ear heals completely, any hearing loss should be temporary. Follow these instructions at home: Medicines Take over-the-counter and prescription medicines only as told by your health care provider. If you were prescribed an antibiotic medicine, use it as told by your health care provider. Do not stop using the antibiotic even if you start to feel better. Ear care Keep your ear dry. This is very important. Follow instructions from your health care provider about how to keep your ear dry. You may need to wear waterproof earplugs when bathing and swimming. If directed, apply heat to your affected ear as often as told by your health care provider. Use the heat source that your health care provider recommends, such as a moist heat pack or a heating pad. This will help to relieve pain. Place a towel between your skin and the heat source. Leave the heat on for 20-30 minutes. Remove the heat if your skin turns bright red. This  is especially important if you are unable to feel pain, heat, or cold. You have a greater risk of getting burned. General instructions Return to sports and activities as told by your health care provider. Ask your health care provider what activities are safe for you. Wear headgear with ear protection when you play sports in which ear injuries are common. Talk to your health care provider before traveling by  plane. Keep all follow-up visits. This is important. Contact a health care provider if: You have a fever. You have ear pain. You have mucus or blood draining from your ear. You have hearing loss, dizziness, or ringing in your ear. Get help right away if: You have sudden hearing loss. You are very dizzy. You have severe ear pain. Your face feels weak or becomes limp (paralyzed). These symptoms may represent a serious problem that is an emergency. Do not wait to see if the symptoms will go away. Get medical help right away. Call your local emergency services (911 in the [place] Do not drive yourself to the hospital. Summary An eardrum rupture is a hole (perforation) in the eardrum that can cause pain and hearing loss. It is usually caused by a sudden injury to the ear. The eardrum will likely heal on its own within a few weeks. In some cases, surgery may be necessary. Follow instructions from your health care provider about how to keep your ear dry as it heals. This information is not intended to replace advice given to you by your health care provider. Make sure you discuss any questions you have with your health care provider. Document Revised: 01/28/2020 Document Reviewed: 01/28/2020 Elsevier Patient Education  2022 [employer]  Pruritus Pruritus is an itchy feeling on the skin. One of the most common causes is dry skin, but many different things can cause itching. Most cases of itching do not require medical attention. Sometimes itchy skin can turn into a rash. Follow these instructions at home: Skin care  Apply moisturizing lotion to your skin as needed. Lotion that contains petroleum jelly is best. Take medicines or apply medicated creams only as told by your health care provider. This may include: Corticosteroid cream. Anti-itch lotions. Oral antihistamines. Apply a cool, wet cloth (cool compress) to the affected areas. Take baths with one of the following: Epsom salts. You  can get these at your local pharmacy or grocery store. Follow the instructions on the packaging. Baking soda. Pour a small amount into the bath as told by your health care provider. Colloidal oatmeal. You can get this at your local pharmacy or grocery store. Follow the instructions on the packaging. Apply baking soda paste to your skin. To make the paste, stir water into a small amount of baking soda until it reaches a paste-like consistency. Do not scratch your skin. Do not take hot showers or baths, which can make itching worse. A cool shower may help with itching as long as you apply moisturizing lotion after the shower. Do not use scented soaps, detergents, perfumes, and cosmetic products. Instead, use gentle, unscented versions of these items. General instructions Avoid wearing tight clothes. Keep a journal to help find out what is causing your itching. Write down: What you eat and drink. What cosmetic products you use. What soaps or detergents you use. What you wear, including jewelry. Use a humidifier. This keeps the air moist, which helps to prevent dry skin. Be aware of any changes in your itchiness. Contact a health care provider if: The itching  does not go away after several days. You are unusually thirsty or urinating more than normal. Your skin tingles or feels numb. Your skin or the white parts of your eyes turn yellow (jaundice). You feel weak. You have any of the following: Night sweats. Tiredness (fatigue). Weight loss. Abdominal pain. Summary Pruritus is an itchy feeling on the skin. One of the most common causes is dry skin, but many different conditions and factors can cause itching. Apply moisturizing lotion to your skin as needed. Lotion that contains petroleum jelly is best. Take medicines or apply medicated creams only as told by your health care provider. Do not take hot showers or baths. Do not use scented soaps, detergents, perfumes, or cosmetic  products. This information is not intended to replace advice given to you by your health care provider. Make sure you discuss any questions you have with your health care provider. Document Revised: 03/22/2017 Document Reviewed: 03/22/2017 Elsevier Patient Education  2022 [employer].

## 2021-05-21 LAB — LIPID PANEL
Chol/HDL Ratio: 2.3 ratio (ref 0.0–5.0)
Cholesterol, Total: 159 mg/dL (ref 100–199)
HDL: 69 mg/dL (ref >39)
LDL Chol Calc (NIH): 64 mg/dL (ref 0–99)
Triglycerides: 156 mg/dL — ABNORMAL HIGH (ref 0–149)
VLDL Cholesterol Cal: 26 mg/dL (ref 5–40)

## 2021-05-21 LAB — CBC WITH DIFFERENTIAL/PLATELET
Basophils Absolute: 0.1 10*3/uL (ref 0.0–0.2)
Basos: 1 %
EOS (ABSOLUTE): 0.4 10*3/uL (ref 0.0–0.4)
Eos: 5 %
Hematocrit: 34.1 % — ABNORMAL LOW (ref 37.5–51.0)
Hemoglobin: 11.2 g/dL — ABNORMAL LOW (ref 13.0–17.7)
Immature Grans (Abs): 0 10*3/uL (ref 0.0–0.1)
Immature Granulocytes: 0 %
Lymphocytes Absolute: 1.6 10*3/uL (ref 0.7–3.1)
Lymphs: 21 %
MCH: 29.7 pg (ref 26.6–33.0)
MCHC: 32.8 g/dL (ref 31.5–35.7)
MCV: 91 fL (ref 79–97)
Monocytes Absolute: 0.7 10*3/uL (ref 0.1–0.9)
Monocytes: 9 %
Neutrophils Absolute: 4.6 10*3/uL (ref 1.4–7.0)
Neutrophils: 64 %
Platelets: 243 10*3/uL (ref 150–450)
RBC: 3.77 x10E6/uL — ABNORMAL LOW (ref 4.14–5.80)
RDW: 13.2 % (ref 11.6–15.4)
WBC: 7.3 10*3/uL (ref 3.4–10.8)

## 2021-05-21 LAB — COMP. METABOLIC PANEL (12)
AST: 30 IU/L (ref 0–40)
Albumin/Globulin Ratio: 1.8 (ref 1.2–2.2)
Albumin: 4.8 g/dL (ref 3.8–4.9)
Alkaline Phosphatase: 78 IU/L (ref 44–121)
BUN/Creatinine Ratio: 11 (ref 9–20)
BUN: 14 mg/dL (ref 6–24)
Bilirubin Total: 0.7 mg/dL (ref 0.0–1.2)
Calcium: 9.5 mg/dL (ref 8.7–10.2)
Chloride: 103 mmol/L (ref 96–106)
Creatinine, Ser: 1.24 mg/dL (ref 0.76–1.27)
Globulin, Total: 2.6 g/dL (ref 1.5–4.5)
Glucose: 67 mg/dL — ABNORMAL LOW (ref 70–99)
Potassium: 4.3 mmol/L (ref 3.5–5.2)
Sodium: 138 mmol/L (ref 134–144)
Total Protein: 7.4 g/dL (ref 6.0–8.5)
eGFR: 67 mL/min/{1.73_m2} (ref >59)

## 2021-05-21 LAB — T4, FREE: Free T4: 2 ng/dL — ABNORMAL HIGH (ref 0.82–1.77)

## 2021-05-21 LAB — T3: T3, Total: 86 ng/dL (ref 71–180)

## 2021-05-21 LAB — HEPATITIS C ANTIBODY: Hep C Virus Ab: NONREACTIVE

## 2021-05-21 LAB — TSH: TSH: 1.58 u[IU]/mL (ref 0.450–4.500)

## 2021-06-03 ENCOUNTER — Ambulatory Visit (HOSPITAL_COMMUNITY)
Admission: RE | Admit: 2021-06-03 | Discharge: 2021-06-03 | Disposition: A | Discharge status: Home / Self Care | Payer: Medicaid Other | Source: Ambulatory Visit | Attending: Nurse Practitioner | Admitting: Nurse Practitioner

## 2021-06-03 ENCOUNTER — Encounter (HOSPITAL_COMMUNITY): Payer: Self-pay | Admitting: Nurse Practitioner

## 2021-06-03 ENCOUNTER — Other Ambulatory Visit: Payer: Self-pay

## 2021-06-03 VITALS — BP 122/76 | HR 73 | Ht 67.0 in | Wt 163.4 lb

## 2021-06-03 DIAGNOSIS — M25559 Pain in unspecified hip: Secondary | ICD-10-CM | POA: Diagnosis not present

## 2021-06-03 DIAGNOSIS — M5020 Other cervical disc displacement, unspecified cervical region: Secondary | ICD-10-CM | POA: Insufficient documentation

## 2021-06-03 DIAGNOSIS — D6869 Other thrombophilia: Secondary | ICD-10-CM | POA: Insufficient documentation

## 2021-06-03 DIAGNOSIS — I5042 Chronic combined systolic (congestive) and diastolic (congestive) heart failure: Secondary | ICD-10-CM | POA: Diagnosis not present

## 2021-06-03 DIAGNOSIS — M5412 Radiculopathy, cervical region: Secondary | ICD-10-CM | POA: Diagnosis not present

## 2021-06-03 DIAGNOSIS — Z79899 Other long term (current) drug therapy: Secondary | ICD-10-CM | POA: Insufficient documentation

## 2021-06-03 DIAGNOSIS — I110 Hypertensive heart disease with heart failure: Secondary | ICD-10-CM | POA: Diagnosis not present

## 2021-06-03 DIAGNOSIS — Z7901 Long term (current) use of anticoagulants: Secondary | ICD-10-CM | POA: Diagnosis not present

## 2021-06-03 DIAGNOSIS — R9431 Abnormal electrocardiogram [ECG] [EKG]: Secondary | ICD-10-CM | POA: Diagnosis not present

## 2021-06-03 DIAGNOSIS — I4819 Other persistent atrial fibrillation: Secondary | ICD-10-CM | POA: Insufficient documentation

## 2021-06-03 DIAGNOSIS — R001 Bradycardia, unspecified: Secondary | ICD-10-CM | POA: Insufficient documentation

## 2021-06-03 NOTE — Progress Notes (Signed)
? ? ?Primary Care Physician: Vevelyn Francois, NP ?Primary Cardiologist: Dr Oval Linsey ?Primary Electrophysiologist: none ?Referring Physician: Almyra Deforest PA ? ? ?Seth Snyder is a 61 y.o. male with a history of atrial fibrillation, hypertension, and combined systolic and diastolic heart failure who presents for follow up in the [facility]. He was first diagnosed with atrial fibrillation/atrial flutter in [facility] in November 2020.  He was admitted in November 2020 to [facility] for hypertensive urgency and A. Fib.  He was started on Eliquis, aspirin and metoprolol.  After he moved to [place], he was admitted to the hospital in February 2021 with A. fib with RVR.  Echocardiogram at the time showed EF 45 to 50%, indeterminate diastolic function, mild MR, mild AI, mild to moderate TR.  He underwent TEE-DCCVx2 on 05/10/2019.  EF was 30 to 35% on TEE.  Metoprolol was increased to 150 mg daily. Thyroid function was mildly abnormal, therefore amiodarone was avoided.  Myoview obtained on 05/23/2019 showed fixed defect at the apex however reversible inferior defect suggest ischemia.  Cardiac catheterization was recommended.  By the time patient was seen by Coletta Memos on 08/27/2019, EKG shows he has went back into atrial fibrillation. Cardiac catheterization performed on 08/30/2019 showed EF less than 25% by visual estimate, LVEDP 12 mmHg, no significant CAD or aortic valve stenosis. 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 Patient is on Eliquis for a CHADS2VASC score of 2. Patient reports he is unaware of his arrhythmia despite RVR. No h/o significant snoring or alcohol use.  ? ?On follow up today, patient presented for DCCV on 10/15/19 but was in [place]. He reports that he feels improved from a cardiac standpoint with less SOB. His primary concern today is regarding his cervical radiculopathy and hip pain. He is followed by Dr Lorin Mercy.  ? ?Today,  he denies symptoms of palpitations, chest pain, shortness of breath, orthopnea, PND, lower extremity edema, dizziness, presyncope, syncope, snoring, daytime somnolence, bleeding, or neurologic sequela. The patient is tolerating medications without difficulties and is otherwise without complaint today.  ? ?F/u in afib clinic as directed by Oda Kilts, PA, from  his visit there, 04/20/21. He is with an interpretor.  Pt was determined to still not be an ablation candidate for significant, refractory radiculopathy. Now that pt has insurance he expressed desire to come off amiodarone and try tikosyn. His qt interval, if persists being long,may not be compatible to start tikosyn. Per Andy's note, it qt did not shorten up off amio, then he would need to be reloaded on amio.  He has been off 6 weeks now and qtc is still  489 ms. Will get an amio level today. He is also on Celexa and trazodone which prolongs qt.  ? ? ?Atrial Fibrillation Risk Factors: ? ?he does not have symptoms or diagnosis of sleep apnea. ?he does not have a history of rheumatic fever. ?he does not have a history of alcohol use. ?The patient does not have a history of early familial atrial fibrillation or other arrhythmias. ? ?he has a BMI of Body mass index is 25.59 kg/m?Marland KitchenMarland Kitchen ?Filed Weights  ? 06/03/21 0951  ?Weight: 74.1 kg  ? ? ?Family History  ?Problem Relation Age of Onset  ? Diabetes Mother   ? Other Father   ?     unsure of medical history  ? Diabetes Sister   ? Mental illness Sister   ? Mental illness Brother   ? ? ? ?  Atrial Fibrillation Management history: ? ?Previous antiarrhythmic drugs: amiodarone ?Previous cardioversions: 05/10/19 ?Previous ablations: none ?CHADS2VASC score: 2 ?Anticoagulation history: Eliquis ? ? ?Past Medical History:  ?Diagnosis Date  ? Anemia   ? Atrial fibrillation ([place])   ? Atypical chest pain 10/01/2020  ? Dizziness   ? Hypertension   ? Low back pain   ? ?Past Surgical History:  ?Procedure Laterality Date  ? CARDIOVERSION  N/A 05/10/2019  ? Procedure: CARDIOVERSION;  Surgeon: Lelon Perla, MD;  Location: [facility];  Service: Cardiovascular;  Laterality: N/A;  ? COLONOSCOPY  07/12/2013  ? LEFT HEART CATH AND CORONARY ANGIOGRAPHY N/A 08/30/2019  ? Procedure: LEFT HEART CATH AND CORONARY ANGIOGRAPHY;  Surgeon: Jettie Booze, MD;  Location: [facility];  Service: Cardiovascular;  Laterality: N/A;  ? TEE WITHOUT CARDIOVERSION N/A 05/10/2019  ? Procedure: TRANSESOPHAGEAL ECHOCARDIOGRAM (TEE);  Surgeon: Lelon Perla, MD;  Location: [facility] ENDOSCOPY;  Service: Cardiovascular;  Laterality: N/A;  ? ? ?Current Outpatient Medications  ?Medication Sig Dispense Refill  ? acetaminophen (TYLENOL) 500 MG tablet Take 1,000 mg by mouth daily as needed for mild pain.    ? albuterol (VENTOLIN HFA) 108 (90 Base) MCG/ACT inhaler Inhale 2 puffs into the lungs every 6 (six) hours as needed for wheezing or shortness of breath. 8 g 6  ? amLODipine (NORVASC) 2.5 MG tablet Take 1 tablet (2.5 mg total) by mouth daily. 90 tablet 3  ? ANORO ELLIPTA 62.5-25 MCG/INH AEPB Inhale 1 puff into the lungs 2 (two) times daily as needed (sob/wheezing).    ? apixaban (ELIQUIS) 5 MG TABS tablet Take 1 tablet (5 mg total) by mouth 2 (two) times daily. 180 tablet 1  ? atorvastatin (LIPITOR) 40 MG tablet Take 1 tablet (40 mg total) by mouth daily at 6 PM. 90 tablet 3  ? carvedilol (COREG) 25 MG tablet Take 1 tablet (25 mg total) by mouth 2 (two) times daily. 180 tablet 3  ? citalopram (CELEXA) 40 MG tablet Take 1 tablet (40 mg total) by mouth daily. 90 tablet 1  ? DULoxetine (CYMBALTA) 60 MG capsule TAKE 1 CAPSULE BY MOUTH EVERY DAY 90 capsule 3  ? fluticasone-salmeterol (ADVAIR HFA) 115-21 MCG/ACT inhaler Inhale 2 puffs into the lungs 2 (two) times daily. 1 each 12  ? gabapentin (NEURONTIN) 300 MG capsule Take 3 capsules (900 mg total) by mouth at bedtime. 270 capsule 3  ? Heating Pads ([employer] HEATING PAD) PADS 1 application by Does not apply route 3 (three)  times daily. 15 minute intervals only 1 each 0  ? hydrALAZINE (APRESOLINE) 50 MG tablet TAKE 1 TABLET (50 MG TOTAL) BY MOUTH IN THE MORNING AND AT BEDTIME. (Patient taking differently: Take 50 mg by mouth 2 (two) times daily as needed (high blood pressure).) 180 tablet 3  ? hydrOXYzine (ATARAX) 10 MG tablet Take 1 tablet (10 mg total) by mouth 3 (three) times daily as needed. For itch 30 tablet 0  ? levETIRAcetam (KEPPRA) 250 MG tablet Take 1 tablet (250 mg total) by mouth 2 (two) times daily. X 1 week, then 500 mg 2x/day- for nerve/back pain 120 tablet 5  ? levothyroxine (SYNTHROID) 75 MCG tablet Take 1 tablet (75 mcg total) by mouth daily. 30 tablet 11  ? pantoprazole (PROTONIX) 40 MG tablet Take 1 tablet (40 mg total) by mouth daily. 90 tablet 3  ? traZODone (DESYREL) 50 MG tablet Take half tablet at bedtime as needed for sleep. 90 tablet 1  ? triamcinolone ointment (KENALOG)  0.5 % Apply 1 application topically 2 (two) times daily. 30 g 0  ? ?No current facility-administered medications for this encounter.  ? ? ?Allergies  ?Allergen Reactions  ? Entresto [Sacubitril-Valsartan]   ?  Abnormal kidney functions ?  ? ? ?Social History  ? ?Socioeconomic History  ? Marital status: Single  ?  Spouse name: Not on file  ? Number of children: 1  ? Years of education: 8th grade  ? Highest education level: Not on file  ?Occupational History  ? Occupation: Disabled  ?Tobacco Use  ? Smoking status: Former  ?  Types: Cigarettes  ?  Quit date: 12/21/2018  ?  Years since quitting: 2.4  ? Smokeless tobacco: Never  ?Vaping Use  ? Vaping Use: Never used  ?Substance and Sexual Activity  ? Alcohol use: Not Currently  ?  Comment: rare  ? Drug use: Never  ? Sexual activity: Not Currently  ?Other Topics Concern  ? Not on file  ?Social History Narrative  ? Right-handed.  ? Lives at home with his daughter and son-in-law.  ? No daily use of caffeine.  ?   ? ?Social Determinants of Health  ? ?Financial Resource Strain: Medium Risk  ?  Difficulty of Paying Living Expenses: Somewhat hard  ?Food Insecurity: No Food Insecurity  ? Worried About Charity fundraiser in the Last Year: Never true  ? Ran Out of Food in the Last Year: Never true  ?Seth Snyder

## 2021-06-06 ENCOUNTER — Emergency Department (HOSPITAL_COMMUNITY)
Admission: EM | Admit: 2021-06-06 | Discharge: 2021-06-07 | Disposition: A | Discharge status: Home / Self Care | Payer: Medicaid Other | Attending: Emergency Medicine | Admitting: Emergency Medicine

## 2021-06-06 ENCOUNTER — Emergency Department (HOSPITAL_COMMUNITY): Payer: Medicaid Other

## 2021-06-06 ENCOUNTER — Other Ambulatory Visit: Payer: Self-pay

## 2021-06-06 ENCOUNTER — Encounter (HOSPITAL_COMMUNITY): Payer: Self-pay | Admitting: Emergency Medicine

## 2021-06-06 DIAGNOSIS — I10 Essential (primary) hypertension: Secondary | ICD-10-CM | POA: Diagnosis not present

## 2021-06-06 DIAGNOSIS — S3991XA Unspecified injury of abdomen, initial encounter: Secondary | ICD-10-CM | POA: Diagnosis not present

## 2021-06-06 DIAGNOSIS — S80812A Abrasion, left lower leg, initial encounter: Secondary | ICD-10-CM | POA: Insufficient documentation

## 2021-06-06 DIAGNOSIS — S299XXA Unspecified injury of thorax, initial encounter: Secondary | ICD-10-CM | POA: Diagnosis present

## 2021-06-06 DIAGNOSIS — Z7901 Long term (current) use of anticoagulants: Secondary | ICD-10-CM | POA: Insufficient documentation

## 2021-06-06 DIAGNOSIS — Z79899 Other long term (current) drug therapy: Secondary | ICD-10-CM | POA: Diagnosis not present

## 2021-06-06 DIAGNOSIS — I4891 Unspecified atrial fibrillation: Secondary | ICD-10-CM | POA: Diagnosis not present

## 2021-06-06 DIAGNOSIS — S0990XA Unspecified injury of head, initial encounter: Secondary | ICD-10-CM | POA: Insufficient documentation

## 2021-06-06 DIAGNOSIS — S20212A Contusion of left front wall of thorax, initial encounter: Secondary | ICD-10-CM | POA: Diagnosis not present

## 2021-06-06 DIAGNOSIS — S80211A Abrasion, right knee, initial encounter: Secondary | ICD-10-CM | POA: Insufficient documentation

## 2021-06-06 DIAGNOSIS — Y92410 Unspecified street and highway as the place of occurrence of the external cause: Secondary | ICD-10-CM | POA: Diagnosis not present

## 2021-06-06 LAB — COMPREHENSIVE METABOLIC PANEL
ALT: 26 U/L (ref 0–44)
AST: 31 U/L (ref 15–41)
Albumin: 3.6 g/dL (ref 3.5–5.0)
Alkaline Phosphatase: 68 U/L (ref 38–126)
Anion gap: 11 (ref 5–15)
BUN: 6 mg/dL (ref 6–20)
CO2: 21 mmol/L — ABNORMAL LOW (ref 22–32)
Calcium: 8.7 mg/dL — ABNORMAL LOW (ref 8.9–10.3)
Chloride: 104 mmol/L (ref 98–111)
Creatinine, Ser: 1.14 mg/dL (ref 0.61–1.24)
GFR, Estimated: >60 mL/min (ref >60)
Glucose, Bld: 102 mg/dL — ABNORMAL HIGH (ref 70–99)
Potassium: 3.9 mmol/L (ref 3.5–5.1)
Sodium: 136 mmol/L (ref 135–145)
Total Bilirubin: 0.7 mg/dL (ref 0.3–1.2)
Total Protein: 6.4 g/dL — ABNORMAL LOW (ref 6.5–8.1)

## 2021-06-06 LAB — CBC WITH DIFFERENTIAL/PLATELET
Abs Immature Granulocytes: 0.03 10*3/uL (ref 0.00–0.07)
Basophils Absolute: 0.1 10*3/uL (ref 0.0–0.1)
Basophils Relative: 1 %
Eosinophils Absolute: 0.4 10*3/uL (ref 0.0–0.5)
Eosinophils Relative: 6 %
HCT: 33.6 % — ABNORMAL LOW (ref 39.0–52.0)
Hemoglobin: 11.1 g/dL — ABNORMAL LOW (ref 13.0–17.0)
Immature Granulocytes: 1 %
Lymphocytes Relative: 31 %
Lymphs Abs: 2 10*3/uL (ref 0.7–4.0)
MCH: 30.2 pg (ref 26.0–34.0)
MCHC: 33 g/dL (ref 30.0–36.0)
MCV: 91.3 fL (ref 80.0–100.0)
Monocytes Absolute: 0.8 10*3/uL (ref 0.1–1.0)
Monocytes Relative: 12 %
Neutro Abs: 3.1 10*3/uL (ref 1.7–7.7)
Neutrophils Relative %: 49 %
Platelets: 250 10*3/uL (ref 150–400)
RBC: 3.68 MIL/uL — ABNORMAL LOW (ref 4.22–5.81)
RDW: 13.2 % (ref 11.5–15.5)
WBC: 6.4 10*3/uL (ref 4.0–10.5)
nRBC: 0 % (ref 0.0–0.2)

## 2021-06-06 LAB — I-STAT CHEM 8, ED
BUN: 8 mg/dL (ref 6–20)
Calcium, Ion: 1.15 mmol/L (ref 1.15–1.40)
Chloride: 104 mmol/L (ref 98–111)
Creatinine, Ser: 1.5 mg/dL — ABNORMAL HIGH (ref 0.61–1.24)
Glucose, Bld: 101 mg/dL — ABNORMAL HIGH (ref 70–99)
HCT: 34 % — ABNORMAL LOW (ref 39.0–52.0)
Hemoglobin: 11.6 g/dL — ABNORMAL LOW (ref 13.0–17.0)
Potassium: 4 mmol/L (ref 3.5–5.1)
Sodium: 137 mmol/L (ref 135–145)
TCO2: 25 mmol/L (ref 22–32)

## 2021-06-06 LAB — ETHANOL: Alcohol, Ethyl (B): 141 mg/dL — ABNORMAL HIGH (ref <10)

## 2021-06-06 LAB — SAMPLE TO BLOOD BANK

## 2021-06-06 MED ORDER — IOHEXOL 350 MG/ML SOLN
100.0000 mL | Freq: Once | INTRAVENOUS | Status: AC | PRN
  Administered 2021-06-06: 100 mL via INTRAVENOUS
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
## 2021-06-06 NOTE — ED Notes (Signed)
Pt back from CT.l ?
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
## 2021-06-07 LAB — RAPID URINE DRUG SCREEN, HOSP PERFORMED
Amphetamines: NOT DETECTED
Barbiturates: NOT DETECTED
Benzodiazepines: NOT DETECTED
Cocaine: NOT DETECTED
Opiates: NOT DETECTED
Tetrahydrocannabinol: NOT DETECTED
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
## 2021-06-07 NOTE — Discharge Instructions (Addendum)
All your imaging today was normal. ?You will likely be sore for the next few days.  Tylenol or motrin as needed for pain. ?Please follow-up with your primary care doctor. ?Return here for new concerns. ?

## 2021-06-08 ENCOUNTER — Encounter: Payer: Self-pay | Admitting: Nurse Practitioner

## 2021-06-08 ENCOUNTER — Ambulatory Visit (INDEPENDENT_AMBULATORY_CARE_PROVIDER_SITE_OTHER): Payer: Medicaid Other | Admitting: Nurse Practitioner

## 2021-06-08 ENCOUNTER — Other Ambulatory Visit: Payer: Self-pay

## 2021-06-08 DIAGNOSIS — M25512 Pain in left shoulder: Secondary | ICD-10-CM | POA: Diagnosis not present

## 2021-06-08 MED ORDER — ADJUSTABLE ARM SLING MISC
1.0000 | Freq: Every day | 0 refills | Status: DC
Start: 2021-06-08 — End: 2022-04-05

## 2021-06-08 MED ORDER — LIDOCAINE 5 % EX PTCH: 1.0000 | MEDICATED_PATCH | CUTANEOUS | 2 refills | Status: AC
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
## 2021-06-11 LAB — AMIODARONE LEVEL
Amiodarone Lvl: 994 ng/mL — ABNORMAL LOW (ref 1000–2500)
N-Desethyl-Amiodarone: 689 ng/mL

## 2021-06-12 ENCOUNTER — Emergency Department (HOSPITAL_COMMUNITY): Payer: Medicaid Other

## 2021-06-12 ENCOUNTER — Emergency Department (HOSPITAL_COMMUNITY)
Admission: EM | Admit: 2021-06-12 | Discharge: 2021-06-12 | Disposition: A | Discharge status: Home / Self Care | Payer: Medicaid Other | Attending: Emergency Medicine | Admitting: Emergency Medicine

## 2021-06-12 ENCOUNTER — Encounter: Payer: Medicaid Other | Admitting: Physical Medicine and Rehabilitation

## 2021-06-12 DIAGNOSIS — R079 Chest pain, unspecified: Secondary | ICD-10-CM

## 2021-06-12 DIAGNOSIS — R5383 Other fatigue: Secondary | ICD-10-CM | POA: Insufficient documentation

## 2021-06-12 DIAGNOSIS — M79602 Pain in left arm: Secondary | ICD-10-CM | POA: Insufficient documentation

## 2021-06-12 DIAGNOSIS — I10 Essential (primary) hypertension: Secondary | ICD-10-CM | POA: Diagnosis not present

## 2021-06-12 DIAGNOSIS — R0689 Other abnormalities of breathing: Secondary | ICD-10-CM | POA: Insufficient documentation

## 2021-06-12 DIAGNOSIS — M545 Low back pain, unspecified: Secondary | ICD-10-CM | POA: Diagnosis present

## 2021-06-12 DIAGNOSIS — I4891 Unspecified atrial fibrillation: Secondary | ICD-10-CM | POA: Insufficient documentation

## 2021-06-12 DIAGNOSIS — Z7901 Long term (current) use of anticoagulants: Secondary | ICD-10-CM | POA: Insufficient documentation

## 2021-06-12 DIAGNOSIS — R109 Unspecified abdominal pain: Secondary | ICD-10-CM | POA: Diagnosis not present

## 2021-06-12 DIAGNOSIS — R0789 Other chest pain: Secondary | ICD-10-CM | POA: Insufficient documentation

## 2021-06-12 DIAGNOSIS — R42 Dizziness and giddiness: Secondary | ICD-10-CM | POA: Diagnosis not present

## 2021-06-12 DIAGNOSIS — Z20822 Contact with and (suspected) exposure to covid-19: Secondary | ICD-10-CM | POA: Insufficient documentation

## 2021-06-12 DIAGNOSIS — G8929 Other chronic pain: Secondary | ICD-10-CM

## 2021-06-12 DIAGNOSIS — Z79899 Other long term (current) drug therapy: Secondary | ICD-10-CM | POA: Diagnosis not present

## 2021-06-12 LAB — CBC WITH DIFFERENTIAL/PLATELET
Abs Immature Granulocytes: 0.03 10*3/uL (ref 0.00–0.07)
Basophils Absolute: 0.1 10*3/uL (ref 0.0–0.1)
Basophils Relative: 1 %
Eosinophils Absolute: 0.4 10*3/uL (ref 0.0–0.5)
Eosinophils Relative: 5 %
HCT: 30.4 % — ABNORMAL LOW (ref 39.0–52.0)
Hemoglobin: 9.9 g/dL — ABNORMAL LOW (ref 13.0–17.0)
Immature Granulocytes: 0 %
Lymphocytes Relative: 22 %
Lymphs Abs: 1.6 10*3/uL (ref 0.7–4.0)
MCH: 30.3 pg (ref 26.0–34.0)
MCHC: 32.6 g/dL (ref 30.0–36.0)
MCV: 93 fL (ref 80.0–100.0)
Monocytes Absolute: 0.7 10*3/uL (ref 0.1–1.0)
Monocytes Relative: 9 %
Neutro Abs: 4.4 10*3/uL (ref 1.7–7.7)
Neutrophils Relative %: 63 %
Platelets: 248 10*3/uL (ref 150–400)
RBC: 3.27 MIL/uL — ABNORMAL LOW (ref 4.22–5.81)
RDW: 13.2 % (ref 11.5–15.5)
WBC: 7 10*3/uL (ref 4.0–10.5)
nRBC: 0 % (ref 0.0–0.2)

## 2021-06-12 LAB — URINALYSIS, ROUTINE W REFLEX MICROSCOPIC
Bilirubin Urine: NEGATIVE
Glucose, UA: NEGATIVE mg/dL
Hgb urine dipstick: NEGATIVE
Ketones, ur: NEGATIVE mg/dL
Leukocytes,Ua: NEGATIVE
Nitrite: NEGATIVE
Protein, ur: NEGATIVE mg/dL
Specific Gravity, Urine: 1.027 (ref 1.005–1.030)
pH: 5 (ref 5.0–8.0)

## 2021-06-12 LAB — I-STAT CHEM 8, ED
BUN: 9 mg/dL (ref 6–20)
Calcium, Ion: 1.16 mmol/L (ref 1.15–1.40)
Chloride: 109 mmol/L (ref 98–111)
Creatinine, Ser: 1.4 mg/dL — ABNORMAL HIGH (ref 0.61–1.24)
Glucose, Bld: 85 mg/dL (ref 70–99)
HCT: 31 % — ABNORMAL LOW (ref 39.0–52.0)
Hemoglobin: 10.5 g/dL — ABNORMAL LOW (ref 13.0–17.0)
Potassium: 4.4 mmol/L (ref 3.5–5.1)
Sodium: 141 mmol/L (ref 135–145)
TCO2: 24 mmol/L (ref 22–32)

## 2021-06-12 LAB — COMPREHENSIVE METABOLIC PANEL
ALT: 32 U/L (ref 0–44)
AST: 38 U/L (ref 15–41)
Albumin: 3.5 g/dL (ref 3.5–5.0)
Alkaline Phosphatase: 74 U/L (ref 38–126)
Anion gap: 8 (ref 5–15)
BUN: 7 mg/dL (ref 6–20)
CO2: 23 mmol/L (ref 22–32)
Calcium: 8.9 mg/dL (ref 8.9–10.3)
Chloride: 108 mmol/L (ref 98–111)
Creatinine, Ser: 1.34 mg/dL — ABNORMAL HIGH (ref 0.61–1.24)
GFR, Estimated: >60 mL/min (ref >60)
Glucose, Bld: 88 mg/dL (ref 70–99)
Potassium: 4.4 mmol/L (ref 3.5–5.1)
Sodium: 139 mmol/L (ref 135–145)
Total Bilirubin: 0.8 mg/dL (ref 0.3–1.2)
Total Protein: 6.3 g/dL — ABNORMAL LOW (ref 6.5–8.1)

## 2021-06-12 LAB — RAPID URINE DRUG SCREEN, HOSP PERFORMED
Amphetamines: NOT DETECTED
Barbiturates: NOT DETECTED
Benzodiazepines: NOT DETECTED
Cocaine: NOT DETECTED
Opiates: NOT DETECTED
Tetrahydrocannabinol: NOT DETECTED

## 2021-06-12 LAB — CBG MONITORING, ED: Glucose-Capillary: 85 mg/dL (ref 70–99)

## 2021-06-12 LAB — RESP PANEL BY RT-PCR (FLU A&B, COVID) ARPGX2
Influenza A by PCR: NEGATIVE
Influenza B by PCR: NEGATIVE
SARS Coronavirus 2 by RT PCR: NEGATIVE

## 2021-06-12 LAB — TROPONIN I (HIGH SENSITIVITY)
Troponin I (High Sensitivity): 5 ng/L (ref <18)
Troponin I (High Sensitivity): 5 ng/L (ref <18)

## 2021-06-12 LAB — BRAIN NATRIURETIC PEPTIDE: B Natriuretic Peptide: 46.9 pg/mL (ref 0.0–100.0)

## 2021-06-12 LAB — LIPASE, BLOOD: Lipase: 51 U/L (ref 11–51)

## 2021-06-12 LAB — ETHANOL: Alcohol, Ethyl (B): <10 mg/dL (ref <10)

## 2021-06-12 MED ORDER — IOHEXOL 350 MG/ML SOLN
80.0000 mL | Freq: Once | INTRAVENOUS | Status: AC | PRN
  Administered 2021-06-12: 80 mL via INTRAVENOUS

## 2021-06-12 MED ORDER — ACETAMINOPHEN 500 MG PO TABS
1000.0000 mg | ORAL_TABLET | Freq: Once | ORAL | Status: AC
  Administered 2021-06-12: 1000 mg via ORAL
  Filled 2021-06-12: qty 2

## 2021-06-12 MED ORDER — SODIUM CHLORIDE 0.9 % IV BOLUS
500.0000 mL | Freq: Once | INTRAVENOUS | Status: AC
  Administered 2021-06-12: 500 mL via INTRAVENOUS
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
## 2021-06-12 NOTE — ED Notes (Signed)
Dc instructions reviewed with pt. Pt verbalized understanding. PT DC>  

## 2021-06-12 NOTE — Discharge Instructions (Addendum)
Your evaluation today is reassuring and does not show any acute problem with your heart or with your vasculature, I do not see signs of infection either.  You can use Tylenol as needed to help treat your pain and please continue to follow-up with your cardiologist and primary care provider.  Return for new or worsening symptoms. ?
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
## 2021-06-15 ENCOUNTER — Encounter: Payer: Medicaid Other | Admitting: Physical Medicine and Rehabilitation

## 2021-06-18 ENCOUNTER — Other Ambulatory Visit (HOSPITAL_COMMUNITY): Payer: Self-pay | Admitting: Psychiatry

## 2021-06-18 ENCOUNTER — Other Ambulatory Visit: Payer: Self-pay | Admitting: Nurse Practitioner

## 2021-06-18 DIAGNOSIS — F3341 Major depressive disorder, recurrent, in partial remission: Secondary | ICD-10-CM

## 2021-06-19 ENCOUNTER — Encounter: Payer: Self-pay | Admitting: Physical Medicine and Rehabilitation

## 2021-06-19 ENCOUNTER — Encounter
Payer: Medicaid Other | Attending: Physical Medicine and Rehabilitation | Admitting: Physical Medicine and Rehabilitation

## 2021-06-19 VITALS — BP 127/78 | HR 59 | Ht 67.0 in | Wt 156.0 lb

## 2021-06-19 DIAGNOSIS — E039 Hypothyroidism, unspecified: Secondary | ICD-10-CM | POA: Insufficient documentation

## 2021-06-19 DIAGNOSIS — M5440 Lumbago with sciatica, unspecified side: Secondary | ICD-10-CM | POA: Insufficient documentation

## 2021-06-19 DIAGNOSIS — M792 Neuralgia and neuritis, unspecified: Secondary | ICD-10-CM | POA: Diagnosis not present

## 2021-06-19 DIAGNOSIS — F32A Depression, unspecified: Secondary | ICD-10-CM | POA: Insufficient documentation

## 2021-06-19 DIAGNOSIS — M5416 Radiculopathy, lumbar region: Secondary | ICD-10-CM | POA: Insufficient documentation

## 2021-06-19 DIAGNOSIS — Z7901 Long term (current) use of anticoagulants: Secondary | ICD-10-CM | POA: Insufficient documentation

## 2021-06-19 DIAGNOSIS — E871 Hypo-osmolality and hyponatremia: Secondary | ICD-10-CM | POA: Insufficient documentation

## 2021-06-19 DIAGNOSIS — Z833 Family history of diabetes mellitus: Secondary | ICD-10-CM | POA: Insufficient documentation

## 2021-06-19 DIAGNOSIS — R269 Unspecified abnormalities of gait and mobility: Secondary | ICD-10-CM | POA: Insufficient documentation

## 2021-06-19 DIAGNOSIS — I10 Essential (primary) hypertension: Secondary | ICD-10-CM | POA: Insufficient documentation

## 2021-06-19 DIAGNOSIS — M542 Cervicalgia: Secondary | ICD-10-CM | POA: Insufficient documentation

## 2021-06-19 MED ORDER — OXCARBAZEPINE 300 MG PO TABS: 300.0000 mg | ORAL_TABLET | Freq: Every day | ORAL | 5 refills | Status: DC
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
## 2021-06-19 NOTE — Patient Instructions (Signed)
?  Pt is a 61 yr old male with hx of  (no DM);  Afib?- on Amiodarone; HTN; hypothyroidism; and on Eliquis; so cannot have Epidural steroid injections; also depression and nerve/back pain; here for evaluation of neck and back pain with radiculopathy/sciatica.  ? ?Stop Keppra immediately.  Don't need ot wean it.  ? ?2. Trileptal/Oxcarbemazepine- 300 mg nightly x 1 week, then 600 mg nightly x 1 week, then 900 mg nightly.  ? ? ?3. BMP- lab in 43month- [facility] to follow up on sodium levels. April 30th is the day to get it done.  ? ? ?4.  Latest TSH 1.58- so much better controlled, so likely not playing a role in nerve pain.  ? ? ?5. Explained that low sodium is low risk, but possible with this medicine- Trileptal- 4 pt's in 15 years, but can make him confused so need to  NOT miss getting labs drawn.  ? ? ?6. F/U in 3 months- call if not working-  ?

## 2021-06-23 ENCOUNTER — Other Ambulatory Visit: Payer: Self-pay | Admitting: Acute Care

## 2021-07-01 NOTE — Telephone Encounter (Signed)
May refill cream. It looks like [facility] had D/C'd the synthroid. Patient may need a follow up for that medication. ?

## 2021-07-06 ENCOUNTER — Telehealth: Payer: Self-pay | Admitting: Nurse Practitioner

## 2021-07-06 NOTE — Telephone Encounter (Signed)
Samantha from [facility] called to request a verbal order for this patient's wipes and pull on briefs from an NP. Phone: [phone] ?

## 2021-07-10 ENCOUNTER — Ambulatory Visit (INDEPENDENT_AMBULATORY_CARE_PROVIDER_SITE_OTHER): Payer: Medicaid Other | Admitting: Cardiovascular Disease

## 2021-07-10 ENCOUNTER — Encounter (HOSPITAL_BASED_OUTPATIENT_CLINIC_OR_DEPARTMENT_OTHER): Payer: Self-pay | Admitting: Cardiovascular Disease

## 2021-07-10 DIAGNOSIS — I7121 Aneurysm of the ascending aorta, without rupture: Secondary | ICD-10-CM

## 2021-07-10 DIAGNOSIS — I5042 Chronic combined systolic (congestive) and diastolic (congestive) heart failure: Secondary | ICD-10-CM | POA: Diagnosis not present

## 2021-07-10 DIAGNOSIS — I4891 Unspecified atrial fibrillation: Secondary | ICD-10-CM

## 2021-07-10 DIAGNOSIS — R0989 Other specified symptoms and signs involving the circulatory and respiratory systems: Secondary | ICD-10-CM | POA: Diagnosis not present

## 2021-07-10 DIAGNOSIS — I4819 Other persistent atrial fibrillation: Secondary | ICD-10-CM

## 2021-07-10 DIAGNOSIS — I872 Venous insufficiency (chronic) (peripheral): Secondary | ICD-10-CM

## 2021-07-10 HISTORY — DX: Other specified symptoms and signs involving the circulatory and respiratory systems: R09.89

## 2021-07-10 HISTORY — DX: Aneurysm of the ascending aorta, without rupture: I71.21

## 2021-07-10 LAB — BASIC METABOLIC PANEL
BUN/Creatinine Ratio: 6 — ABNORMAL LOW (ref 9–20)
BUN: 9 mg/dL (ref 6–24)
CO2: 19 mmol/L — ABNORMAL LOW (ref 20–29)
Calcium: 9.4 mg/dL (ref 8.7–10.2)
Chloride: 105 mmol/L (ref 96–106)
Creatinine, Ser: 1.43 mg/dL — ABNORMAL HIGH (ref 0.76–1.27)
Glucose: 95 mg/dL (ref 70–99)
Potassium: 5.1 mmol/L (ref 3.5–5.2)
Sodium: 139 mmol/L (ref 134–144)
eGFR: 56 mL/min/{1.73_m2} — ABNORMAL LOW (ref >59)

## 2021-07-10 MED ORDER — HYDRALAZINE HCL 25 MG PO TABS: 25.0000 mg | ORAL_TABLET | Freq: Two times a day (BID) | ORAL | 3 refills | Status: AC

## 2021-07-10 NOTE — Assessment & Plan Note (Signed)
He remains in sinus rhythm on amiodarone.  Continue carvedilol and Eliquis.  ?

## 2021-07-10 NOTE — Patient Instructions (Addendum)
Medication Instructions:  ?DO NOT TAKE YOUR ANY MORE CARVEDILOL OR HYDRALAZINE TODAY  ? ?STARTING TOMORROW DECREASE YOUR HYDRALAZINE TO 25 MG TWICE A DAY  ? ?*If you need a refill on your cardiac medications before your next appointment, please call your pharmacy* ? ?Lab Work: ?BMET TODAY  ? ?If you have labs (blood work) drawn today and your tests are completely normal, you will receive your results only by: ?MyChart Message (if you have MyChart) OR ?A paper copy in the mail ?If you have any lab test that is abnormal or we need to change your treatment, we will call you to review the results. ? ?Testing/Procedures: ?NONE ? ?Follow-Up: ?At [facility], you and your health needs are our priority.  As part of our continuing mission to provide you with exceptional heart care, we have created designated Provider Care Teams.  These Care Teams include your primary Cardiologist (physician) and Advanced Practice Providers (APPs -  Physician Assistants and Nurse Practitioners) who all work together to provide you with the care you need, when you need it. ? ?We recommend signing up for the patient portal called "MyChart".  Sign up information is provided on this After Visit Summary.  MyChart is used to connect with patients for Virtual Visits (Telemedicine).  Patients are able to view lab/test results, encounter notes, upcoming appointments, etc.  Non-urgent messages can be sent to your provider as well.   ?To learn more about what you can do with MyChart, go to [email or web address].   ? ?Your next appointment:   ?3-4 month(s) ? ?The format for your next appointment:   ?In Person ? ?Provider:   ?Skeet Latch, MD  ? ?[place] NP  ? ?Other Instructions ?MONITOR YOUR BLOOD PRESSURE TWICE A DAY,  BRING YOUR READINGS AND MACHINE TO YOUR FOLLOW UP APPOINTMENT  ? ?Important Information About Sugar ? ? ? ? ? ?

## 2021-07-10 NOTE — Assessment & Plan Note (Signed)
Recommend compression stocking.  No edema on exam today.  He was given a prescription. ?

## 2021-07-10 NOTE — Assessment & Plan Note (Signed)
Currently in sinus rhythm.  Continue carvedilol. 

## 2021-07-10 NOTE — Assessment & Plan Note (Addendum)
Blood pressure is extremely labile.  Today his BP is very low.  He has struggled with falls due to low BP.  He has also had elevated BP to the 180/200s.  He has no adenomas on recent abdominal CT.  Pheochromocytoma unlikely.  He is on many neurologically active medications.  Some can cause hypertension and some can cause hypotension.  He has a new PCP in [place] and will review whether this could be reduced.  Citalopram can cause hypotension and Cymbalta hypertension.  He is also on gabapentin, hydroxazine, oxcarbazepine, and trazodone.  Query whether these could be contributing to autonomic dysfunction.  We will reduce hydralazine to 25mg .  No more BP medication today.  Continue amlodipine and carvedilol.  He will track his BP bid and bring to follow up in 2 weeks.  ?

## 2021-07-10 NOTE — Assessment & Plan Note (Signed)
4.2 cm on echo 03/2021.  BP very labile.  He is on a beta blocker.  Repeat echo 03/2022. ?

## 2021-07-10 NOTE — Assessment & Plan Note (Signed)
LVEF has normalized.  Continue to work on BP control.  ?
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
## 2021-07-14 ENCOUNTER — Encounter (HOSPITAL_COMMUNITY): Payer: Self-pay | Admitting: Emergency Medicine

## 2021-07-14 ENCOUNTER — Emergency Department (HOSPITAL_COMMUNITY)
Admission: EM | Admit: 2021-07-14 | Discharge: 2021-07-14 | Disposition: A | Discharge status: Home / Self Care | Payer: Medicaid Other | Attending: Emergency Medicine | Admitting: Emergency Medicine

## 2021-07-14 ENCOUNTER — Other Ambulatory Visit: Payer: Self-pay

## 2021-07-14 ENCOUNTER — Emergency Department (HOSPITAL_COMMUNITY): Payer: Medicaid Other

## 2021-07-14 DIAGNOSIS — I951 Orthostatic hypotension: Secondary | ICD-10-CM | POA: Insufficient documentation

## 2021-07-14 DIAGNOSIS — I10 Essential (primary) hypertension: Secondary | ICD-10-CM | POA: Insufficient documentation

## 2021-07-14 DIAGNOSIS — R55 Syncope and collapse: Secondary | ICD-10-CM

## 2021-07-14 DIAGNOSIS — Z7901 Long term (current) use of anticoagulants: Secondary | ICD-10-CM | POA: Diagnosis not present

## 2021-07-14 DIAGNOSIS — Z79899 Other long term (current) drug therapy: Secondary | ICD-10-CM | POA: Diagnosis not present

## 2021-07-14 LAB — BASIC METABOLIC PANEL
Anion gap: 7 (ref 5–15)
BUN: 10 mg/dL (ref 6–20)
CO2: 21 mmol/L — ABNORMAL LOW (ref 22–32)
Calcium: 8.7 mg/dL — ABNORMAL LOW (ref 8.9–10.3)
Chloride: 110 mmol/L (ref 98–111)
Creatinine, Ser: 1.26 mg/dL — ABNORMAL HIGH (ref 0.61–1.24)
GFR, Estimated: >60 mL/min (ref >60)
Glucose, Bld: 105 mg/dL — ABNORMAL HIGH (ref 70–99)
Potassium: 5.1 mmol/L (ref 3.5–5.1)
Sodium: 138 mmol/L (ref 135–145)

## 2021-07-14 LAB — CBC
HCT: 33.8 % — ABNORMAL LOW (ref 39.0–52.0)
Hemoglobin: 11 g/dL — ABNORMAL LOW (ref 13.0–17.0)
MCH: 30.1 pg (ref 26.0–34.0)
MCHC: 32.5 g/dL (ref 30.0–36.0)
MCV: 92.3 fL (ref 80.0–100.0)
Platelets: 212 10*3/uL (ref 150–400)
RBC: 3.66 MIL/uL — ABNORMAL LOW (ref 4.22–5.81)
RDW: 13.1 % (ref 11.5–15.5)
WBC: 9.5 10*3/uL (ref 4.0–10.5)
nRBC: 0 % (ref 0.0–0.2)

## 2021-07-14 LAB — URINALYSIS, ROUTINE W REFLEX MICROSCOPIC
Bilirubin Urine: NEGATIVE
Glucose, UA: NEGATIVE mg/dL
Hgb urine dipstick: NEGATIVE
Ketones, ur: NEGATIVE mg/dL
Leukocytes,Ua: NEGATIVE
Nitrite: NEGATIVE
Protein, ur: NEGATIVE mg/dL
Specific Gravity, Urine: 1.006 (ref 1.005–1.030)
pH: 6 (ref 5.0–8.0)

## 2021-07-14 LAB — TROPONIN I (HIGH SENSITIVITY)
Troponin I (High Sensitivity): 5 ng/L (ref <18)
Troponin I (High Sensitivity): 6 ng/L (ref <18)

## 2021-07-14 MED ORDER — SODIUM CHLORIDE 0.9 % IV BOLUS
1000.0000 mL | Freq: Once | INTRAVENOUS | Status: AC
  Administered 2021-07-14: 1000 mL via INTRAVENOUS

## 2021-07-14 NOTE — Discharge Instructions (Addendum)
Your workup was overall reassuring in the ED today. It is recommended that you follow up with your cardiologist for further evaluation.  ? ?Continue taking your blood pressure medications as indicated and keep a log of your blood pressure daily.  ? ?Please drink plenty of fluids to stay hydrated. It is also recommended that whenever you get up from a seated or laying position that you rise slowly to prevent passing out.  ? ?Return to the ED for any new/worsening symptoms ?

## 2021-07-14 NOTE — ED Provider Notes (Signed)
?[facility] EMERGENCY DEPARTMENT ?Provider Note ? ? ?CSN: [id number] ?Arrival date & time: 07/14/21  1459 ? ?  ? ?History ? ?Chief Complaint  ?Patient presents with  ? Loss of Consciousness  ? ? ?Seth Snyder is a 61 y.o. male with PMHx labile HTN, A fib, anemia who presents to the ED today via EMS for syncopal episode.  Patient reports that he felt like he needed to have a bowel movement.  He got up to use the restroom however on his way there passed out.  He is not sure if he hit his head and is complaining of posterior head pain.  Per EMS patient was hypotensive.  500 cc fluid bolus provided.  On arrival to the ED blood pressure 105/71.  Per chart review patient was just seen by cardiology for labile hypertension on 04/21.  It does appear that his blood pressure runs high in the wound.  His hydralazine was decreased to 25 mg with continuation of his amlodipine and carvedilol.  He was advised to track his blood pressures twice daily for the next 2 weeks with plans for follow-up.  Patient denies any chest pain or shortness of breath prior to syncopal event today.  ? ?The history is provided by the patient and medical records. The history is limited by a language barrier. A language interpreter was used.  ? ?  ? ?Home Medications ?Prior to Admission medications   ?Medication Sig Start Date End Date Taking? Authorizing Provider  ?acetaminophen (TYLENOL) 500 MG tablet Take by mouth. 03/12/19   [provider]  ?albuterol (VENTOLIN HFA) 108 (90 Base) MCG/ACT inhaler Inhale 2 puffs into the lungs every 6 (six) hours as needed for wheezing or shortness of breath. 09/08/20   Bevelyn Ngo, NP  ?amiodarone (PACERONE) 200 MG tablet Take 200 mg by mouth daily. 06/18/21   [provider]  ?amLODipine (NORVASC) 2.5 MG tablet Take 1 tablet (2.5 mg total) by mouth daily. 04/20/21 04/20/22  Graciella Freer, PA-C  ?ammonium lactate (AMLACTIN) 12 % cream Apply topically. 03/12/19    [provider]  ?Ernestina Patches 62.5-25 MCG/INH AEPB Inhale 1 puff into the lungs 2 (two) times daily as needed (sob/wheezing). 11/03/20   [provider]  ?apixaban (ELIQUIS) 5 MG TABS tablet Take 1 tablet (5 mg total) by mouth 2 (two) times daily. 04/20/21   Graciella Freer, PA-C  ?atorvastatin (LIPITOR) 40 MG tablet Take 1 tablet (40 mg total) by mouth daily at 6 PM. 04/20/21   Tillery, Mariam Dollar, PA-C  ?carvedilol (COREG) 25 MG tablet Take 1 tablet (25 mg total) by mouth 2 (two) times daily. 04/20/21   Graciella Freer, PA-C  ?citalopram (CELEXA) 40 MG tablet Take 1 tablet (40 mg total) by mouth daily. 09/11/20   Zena Amos, MD  ?DULoxetine (CYMBALTA) 60 MG capsule TAKE 1 CAPSULE BY MOUTH EVERY DAY 08/25/20   Levert Feinstein, MD  ?Elastic Bandages & Supports (ADJUSTABLE ARM SLING) MISC 1 application. by Does not apply route daily. 06/08/21   Barbette Merino, NP  ?fluticasone-salmeterol (ADVAIR HFA) 115-21 MCG/ACT inhaler Inhale 2 puffs into the lungs 2 (two) times daily. 09/08/20   Bevelyn Ngo, NP  ?gabapentin (NEURONTIN) 300 MG capsule Take 3 capsules (900 mg total) by mouth at bedtime. 02/16/21 02/16/22  Barbette Merino, NP  ?Heating Pads ([employer] HEATING PAD) PADS 1 application by Does not apply route 3 (three) times daily. 15 minute intervals only 11/12/20   Brooke Dare,  Shana Chute, NP  ?hydrALAZINE (APRESOLINE) 25 MG tablet Take 1 tablet (25 mg total) by mouth 2 (two) times daily. 12 HOURS APART 07/10/21   Chilton Si, MD  ?hydrOXYzine (ATARAX) 10 MG tablet Take 1 tablet (10 mg total) by mouth 3 (three) times daily as needed. For itch 05/20/21   Barbette Merino, NP  ?levothyroxine (SYNTHROID) 75 MCG tablet Take 1 tablet (75 mcg total) by mouth daily. 02/02/21 02/02/22  Barbette Merino, NP  ?lidocaine (LIDODERM) 5 % Place 1 patch onto the skin daily. Remove & Discard patch within 12 hours or as directed by MD 06/08/21 09/06/21  Barbette Merino, NP  ?methocarbamol (ROBAXIN) 500 MG tablet  Take by mouth. 03/12/19   [provider]  ?Oxcarbazepine (TRILEPTAL) 300 MG tablet Take 1 tablet (300 mg total) by mouth at bedtime. X 1 week, then 600 mg nightly x 1 week, then 900 mg nightly- for nerve pain 06/19/21   Lovorn, Aundra Millet, MD  ?pantoprazole (PROTONIX) 40 MG tablet Take 1 tablet (40 mg total) by mouth daily. 10/01/20   Chilton Si, MD  ?traZODone (DESYREL) 50 MG tablet Take half tablet at bedtime as needed for sleep. 09/11/20   Zena Amos, MD  ?triamcinolone ointment (KENALOG) 0.5 % APPLY TO AFFECTED AREA TWICE A DAY 07/01/21   Orion Crook I, NP  ?   ? ?Allergies    ?Entresto [sacubitril-valsartan]   ? ?Review of Systems   ?Review of Systems  ?Constitutional:  Negative for chills and fever.  ?Respiratory:  Negative for shortness of breath.   ?Cardiovascular:  Negative for chest pain.  ?Neurological:  Positive for dizziness, syncope and headaches.  ?All other systems reviewed and are negative. ? ?Physical Exam ?Updated Vital Signs ?BP (!) 158/93   Pulse 67   Temp 97.8 ?F (36.6 ?C) (Oral)   Resp 15   SpO2 98%  ?Physical Exam ?Vitals and nursing note reviewed.  ?Constitutional:   ?   Appearance: He is not ill-appearing or diaphoretic.  ?HENT:  ?   Head: Normocephalic and atraumatic.  ?Eyes:  ?   Extraocular Movements: Extraocular movements intact.  ?   Conjunctiva/sclera: Conjunctivae normal.  ?   Pupils: Pupils are equal, round, and reactive to light.  ?Cardiovascular:  ?   Rate and Rhythm: Normal rate and regular rhythm.  ?   Pulses: Normal pulses.  ?Pulmonary:  ?   Effort: Pulmonary effort is normal.  ?   Breath sounds: Normal breath sounds. No wheezing, rhonchi or rales.  ?Abdominal:  ?   Palpations: Abdomen is soft.  ?   Tenderness: There is no abdominal tenderness.  ?Musculoskeletal:  ?   Cervical back: Neck supple.  ?Skin: ?   General: Skin is warm and dry.  ?Neurological:  ?   Mental Status: He is alert.  ?   Comments: Alert and oriented to self, place, time and event.   ? ?Speech is fluent, clear without dysarthria or dysphasia.  ? ?Strength 5/5 in upper/lower extremities   ?Sensation intact in upper/lower extremities  ? ?Normal gait.  ?Negative Romberg. No pronator drift.  ?Normal finger-to-nose and feet tapping.  ?CN I not tested  ?CN II grossly intact visual fields bilaterally. Did not visualize posterior eye.  ?CN III, IV, VI PERRLA and EOMs intact bilaterally  ?CN V Intact sensation to sharp and light touch to the face  ?CN VII facial movements symmetric  ?CN VIII not tested  ?CN IX, X no uvula deviation, symmetric rise of soft  palate  ?CN XI 5/5 SCM and trapezius strength bilaterally  ?CN XII Midline tongue protrusion, symmetric L/R movements   ? ? ?ED Results / Procedures / Treatments   ?Labs ?(all labs ordered are listed, but only abnormal results are displayed) ?Labs Reviewed  ?BASIC METABOLIC PANEL - Abnormal; Notable for the following components:  ?    Result Value  ? CO2 21 (*)   ? Glucose, Bld 105 (*)   ? Creatinine, Tramaine 1.26 (*)   ? Calcium 8.7 (*)   ? All other components within normal limits  ?CBC - Abnormal; Notable for the following components:  ? RBC 3.66 (*)   ? Hemoglobin 11.0 (*)   ? HCT 33.8 (*)   ? All other components within normal limits  ?URINALYSIS, ROUTINE W REFLEX MICROSCOPIC - Abnormal; Notable for the following components:  ? Color, Urine STRAW (*)   ? Bacteria, UA RARE (*)   ? All other components within normal limits  ?TROPONIN I (HIGH SENSITIVITY)  ?TROPONIN I (HIGH SENSITIVITY)  ? ? ?EKG ?EKG Interpretation ? ?Date/Time:  Tuesday July 14 2021 15:06:19 EDT ?Ventricular Rate:  56 ?PR Interval:  200 ?QRS Duration: 112 ?QT Interval:  520 ?QTC Calculation: 502 ?R Axis:   68 ?Text Interpretation: Sinus rhythm Borderline intraventricular conduction delay Prolonged QT interval Confirmed by Rolan Bucco [phone]) on 07/14/2021 5:46:30 PM ? ?Radiology ?CT HEAD WO CONTRAST ( ) ? ?Result Date: 07/14/2021 ?CLINICAL DATA:  Head injury EXAM: CT HEAD WITHOUT  CONTRAST TECHNIQUE: Contiguous axial images were obtained from the base of the skull through the vertex without intravenous contrast. RADIATION DOSE REDUCTION: This exam was performed according to the departmental dose-o

## 2021-07-14 NOTE — ED Notes (Signed)
Got patient int a gown on the monitor did ekg shown to er provider ?

## 2021-07-14 NOTE — ED Notes (Signed)
PT attempting to try to get a ride home.  RN attempted to call family as well with no answer.  No voicemail set up ? ?
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
## 2021-07-15 ENCOUNTER — Encounter (HOSPITAL_COMMUNITY): Payer: Self-pay | Admitting: Nurse Practitioner

## 2021-07-15 ENCOUNTER — Ambulatory Visit (HOSPITAL_COMMUNITY)
Admission: RE | Admit: 2021-07-15 | Discharge: 2021-07-15 | Disposition: A | Discharge status: Home / Self Care | Payer: Medicaid Other | Source: Ambulatory Visit | Attending: Nurse Practitioner | Admitting: Nurse Practitioner

## 2021-07-15 VITALS — BP 74/52 | HR 63 | Ht 67.0 in | Wt 161.2 lb

## 2021-07-15 DIAGNOSIS — I5042 Chronic combined systolic (congestive) and diastolic (congestive) heart failure: Secondary | ICD-10-CM | POA: Diagnosis not present

## 2021-07-15 DIAGNOSIS — I428 Other cardiomyopathies: Secondary | ICD-10-CM | POA: Insufficient documentation

## 2021-07-15 DIAGNOSIS — M25559 Pain in unspecified hip: Secondary | ICD-10-CM | POA: Diagnosis not present

## 2021-07-15 DIAGNOSIS — I4819 Other persistent atrial fibrillation: Secondary | ICD-10-CM | POA: Diagnosis present

## 2021-07-15 DIAGNOSIS — M542 Cervicalgia: Secondary | ICD-10-CM | POA: Insufficient documentation

## 2021-07-15 DIAGNOSIS — I4891 Unspecified atrial fibrillation: Secondary | ICD-10-CM

## 2021-07-15 DIAGNOSIS — R0989 Other specified symptoms and signs involving the circulatory and respiratory systems: Secondary | ICD-10-CM

## 2021-07-15 DIAGNOSIS — I110 Hypertensive heart disease with heart failure: Secondary | ICD-10-CM | POA: Diagnosis not present

## 2021-07-15 DIAGNOSIS — I4892 Unspecified atrial flutter: Secondary | ICD-10-CM | POA: Insufficient documentation

## 2021-07-15 DIAGNOSIS — M5412 Radiculopathy, cervical region: Secondary | ICD-10-CM | POA: Insufficient documentation

## 2021-07-15 DIAGNOSIS — D6869 Other thrombophilia: Secondary | ICD-10-CM | POA: Insufficient documentation

## 2021-07-15 NOTE — Patient Instructions (Signed)
[facility] help desk [phone] ?

## 2021-07-15 NOTE — Progress Notes (Signed)
? ? ?Primary Care Physician: Pcp, No ?Primary Cardiologist: Dr Oval Linsey ?Primary Electrophysiologist: none ?Referring Physician: Almyra Deforest PA ? ? ?Seth Snyder is a 61 y.o. male with a history of atrial fibrillation, hypertension, and combined systolic and diastolic heart failure who presents for follow up in the [facility]. He was first diagnosed with atrial fibrillation/atrial flutter in [facility] in November 2020.  He was admitted in November 2020 to [facility] for hypertensive urgency and A. Fib.  He was started on Eliquis, aspirin and metoprolol.  After he moved to [place], he was admitted to the hospital in February 2021 with A. fib with RVR.  Echocardiogram at the time showed EF 45 to 50%, indeterminate diastolic function, mild MR, mild AI, mild to moderate TR.  He underwent TEE-DCCVx2 on 05/10/2019.  EF was 30 to 35% on TEE.  Metoprolol was increased to 150 mg daily. Thyroid function was mildly abnormal, therefore amiodarone was avoided.  Myoview obtained on 05/23/2019 showed fixed defect at the apex however reversible inferior defect suggest ischemia.  Cardiac catheterization was recommended.  By the time patient was seen by Coletta Memos on 08/27/2019, EKG shows he has went back into atrial fibrillation. Cardiac catheterization performed on 08/30/2019 showed EF less than 25% by visual estimate, LVEDP 12 mmHg, no significant CAD or aortic valve stenosis. 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 Patient is on Eliquis for a CHADS2VASC score of 2. Patient reports he is unaware of his arrhythmia despite RVR. No h/o significant snoring or alcohol use.  ? ?On follow up today, patient presented for DCCV on 10/15/19 but was in [place]. He reports that he feels improved from a cardiac standpoint with less SOB. His primary concern today is regarding his cervical radiculopathy and hip pain. He is followed by Dr Lorin Mercy.  ? ?Today, he denies  symptoms of palpitations, chest pain, shortness of breath, orthopnea, PND, lower extremity edema, dizziness, presyncope, syncope, snoring, daytime somnolence, bleeding, or neurologic sequela. The patient is tolerating medications without difficulties and is otherwise without complaint today.  ? ?F/u in afib clinic as directed by Oda Kilts, PA, from  his visit there, 04/20/21. He is with an interpretor.  Pt was determined to still not be an ablation candidate for significant, refractory radiculopathy. Now that pt has insurance he expressed desire to come off amiodarone and try tikosyn. His qt interval, if persists being long,may not be compatible to start tikosyn. Per Andy's note, it qt did not shorten up off amio, then he would need to be reloaded on amio.  He has been off 6 weeks now and qtc is still  489 ms. Will get an amio level today. He is also on Celexa and trazodone which prolongs qt.  ? ?F/u afib clinic, 07/15/21, with interpretor,  for f/u coming off amiodarone to see if qt interval shortens to the necessary length to be able to tolerate tikosyn. On last visit, 6 weeks ago, his amio level was too high. He was treated in the ER yesterday for a syncopal spell thought to be 2/2 labile BP with hypotension. Dr. Oval Linsey is following this. His qt is still prolonged at 501 ms. I will get another amio level today but if it is washed out and qt is still at this length, he will not be a candidate for Tikosyn and per Dr. Jackalyn Lombard note,  he is to start back on amiodarone. He is hypotensive in the clinic today at 67/52. His  hydralazine was stopped recently and amlodipine 2.5 mg daily was added. He took that this am as well as carvedilol. His BP earlier today was at 160/110.  ? ? ?Atrial Fibrillation Risk Factors: ? ?he does not have symptoms or diagnosis of sleep apnea. ?he does not have a history of rheumatic fever. ?he does not have a history of alcohol use. ?The patient does not have a history of early familial  atrial fibrillation or other arrhythmias. ? ?he has a BMI of Body mass index is 24.78 kg/m?Marland KitchenMarland Kitchen ?There were no vitals filed for this visit. ? ? ?Family History  ?Problem Relation Age of Onset  ? Diabetes Mother   ? Other Father   ?     unsure of medical history  ? Diabetes Sister   ? Mental illness Sister   ? Mental illness Brother   ? ? ? ?Atrial Fibrillation Management history: ? ?Previous antiarrhythmic drugs: amiodarone ?Previous cardioversions: 05/10/19 ?Previous ablations: none ?CHADS2VASC score: 2 ?Anticoagulation history: Eliquis ? ? ?Past Medical History:  ?Diagnosis Date  ? Anemia   ? Ascending aortic aneurysm ([place]) 07/10/2021  ? Atrial fibrillation ([place])   ? Atypical chest pain 10/01/2020  ? Dizziness   ? Hypertension   ? Labile hypertension 07/10/2021  ? Low back pain   ? ?Past Surgical History:  ?Procedure Laterality Date  ? CARDIOVERSION N/A 05/10/2019  ? Procedure: CARDIOVERSION;  Surgeon: Lelon Perla, MD;  Location: [place];  Service: Cardiovascular;  Laterality: N/A;  ? COLONOSCOPY  07/12/2013  ? LEFT HEART CATH AND CORONARY ANGIOGRAPHY N/A 08/30/2019  ? Procedure: LEFT HEART CATH AND CORONARY ANGIOGRAPHY;  Surgeon: Jettie Booze, MD;  Location: [place] CV LAB;  Service: Cardiovascular;  Laterality: N/A;  ? TEE WITHOUT CARDIOVERSION N/A 05/10/2019  ? Procedure: TRANSESOPHAGEAL ECHOCARDIOGRAM (TEE);  Surgeon: Lelon Perla, MD;  Location: [facility] ENDOSCOPY;  Service: Cardiovascular;  Laterality: N/A;  ? ? ?Current Outpatient Medications  ?Medication Sig Dispense Refill  ? acetaminophen (TYLENOL) 500 MG tablet Take by mouth.    ? albuterol (VENTOLIN HFA) 108 (90 Base) MCG/ACT inhaler Inhale 2 puffs into the lungs every 6 (six) hours as needed for wheezing or shortness of breath. 8 g 6  ? amiodarone (PACERONE) 200 MG tablet Take 200 mg by mouth daily.    ? amLODipine (NORVASC) 2.5 MG tablet Take 1 tablet (2.5 mg total) by mouth daily. 90 tablet 3  ? ammonium lactate (AMLACTIN) 12 % cream  Apply topically.    ? ANORO ELLIPTA 62.5-25 MCG/INH AEPB Inhale 1 puff into the lungs 2 (two) times daily as needed (sob/wheezing).    ? apixaban (ELIQUIS) 5 MG TABS tablet Take 1 tablet (5 mg total) by mouth 2 (two) times daily. 180 tablet 1  ? atorvastatin (LIPITOR) 40 MG tablet Take 1 tablet (40 mg total) by mouth daily at 6 PM. 90 tablet 3  ? carvedilol (COREG) 25 MG tablet Take 1 tablet (25 mg total) by mouth 2 (two) times daily. 180 tablet 3  ? citalopram (CELEXA) 40 MG tablet Take 1 tablet (40 mg total) by mouth daily. 90 tablet 1  ? DULoxetine (CYMBALTA) 60 MG capsule TAKE 1 CAPSULE BY MOUTH EVERY DAY 90 capsule 3  ? Elastic Bandages & Supports (ADJUSTABLE ARM SLING) MISC 1 application. by Does not apply route daily. 1 each 0  ? fluticasone-salmeterol (ADVAIR HFA) 115-21 MCG/ACT inhaler Inhale 2 puffs into the lungs 2 (two) times daily. 1 each 12  ? gabapentin (NEURONTIN) 300 MG  capsule Take 3 capsules (900 mg total) by mouth at bedtime. 270 capsule 3  ? Heating Pads ([employer] HEATING PAD) PADS 1 application by Does not apply route 3 (three) times daily. 15 minute intervals only 1 each 0  ? hydrALAZINE (APRESOLINE) 25 MG tablet Take 1 tablet (25 mg total) by mouth 2 (two) times daily. 12 HOURS APART 180 tablet 3  ? hydrOXYzine (ATARAX) 10 MG tablet Take 1 tablet (10 mg total) by mouth 3 (three) times daily as needed. For itch 30 tablet 0  ? levothyroxine (SYNTHROID) 75 MCG tablet Take 1 tablet (75 mcg total) by mouth daily. 30 tablet 11  ? lidocaine (LIDODERM) 5 % Place 1 patch onto the skin daily. Remove & Discard patch within 12 hours or as directed by MD 30 patch 2  ? methocarbamol (ROBAXIN) 500 MG tablet Take by mouth.    ? Oxcarbazepine (TRILEPTAL) 300 MG tablet Take 1 tablet (300 mg total) by mouth at bedtime. X 1 week, then 600 mg nightly x 1 week, then 900 mg nightly- for nerve pain 90 tablet 5  ? pantoprazole (PROTONIX) 40 MG tablet Take 1 tablet (40 mg total) by mouth daily. 90 tablet 3  ? traZODone  (DESYREL) 50 MG tablet Take half tablet at bedtime as needed for sleep. 90 tablet 1  ? triamcinolone ointment (KENALOG) 0.5 % APPLY TO AFFECTED AREA TWICE A DAY 30 g 1  ? ?No current facility-administered medica

## 2021-07-17 ENCOUNTER — Telehealth: Payer: Self-pay

## 2021-07-17 NOTE — Telephone Encounter (Signed)
No additional notes needed  

## 2021-07-21 ENCOUNTER — Ambulatory Visit (INDEPENDENT_AMBULATORY_CARE_PROVIDER_SITE_OTHER): Payer: Medicaid Other | Admitting: Family

## 2021-07-21 ENCOUNTER — Encounter (HOSPITAL_BASED_OUTPATIENT_CLINIC_OR_DEPARTMENT_OTHER): Payer: Self-pay | Admitting: Family

## 2021-07-21 ENCOUNTER — Ambulatory Visit (HOSPITAL_BASED_OUTPATIENT_CLINIC_OR_DEPARTMENT_OTHER)
Admission: RE | Admit: 2021-07-21 | Discharge: 2021-07-21 | Disposition: A | Discharge status: Home / Self Care | Payer: Medicaid Other | Source: Ambulatory Visit | Attending: Family | Admitting: Family

## 2021-07-21 VITALS — BP 98/68 | HR 68 | Ht 67.0 in | Wt 166.3 lb

## 2021-07-21 DIAGNOSIS — I5022 Chronic systolic (congestive) heart failure: Secondary | ICD-10-CM

## 2021-07-21 DIAGNOSIS — M25512 Pain in left shoulder: Secondary | ICD-10-CM | POA: Diagnosis not present

## 2021-07-21 DIAGNOSIS — R5383 Other fatigue: Secondary | ICD-10-CM | POA: Diagnosis not present

## 2021-07-21 DIAGNOSIS — R0989 Other specified symptoms and signs involving the circulatory and respiratory systems: Secondary | ICD-10-CM | POA: Diagnosis not present

## 2021-07-21 DIAGNOSIS — I4819 Other persistent atrial fibrillation: Secondary | ICD-10-CM

## 2021-07-21 DIAGNOSIS — X58XXXD Exposure to other specified factors, subsequent encounter: Secondary | ICD-10-CM | POA: Diagnosis not present

## 2021-07-21 DIAGNOSIS — Z79899 Other long term (current) drug therapy: Secondary | ICD-10-CM

## 2021-07-21 LAB — AMIODARONE LEVEL
Amiodarone Lvl: 662 ng/mL — ABNORMAL LOW (ref 1000–2500)
N-Desethyl-Amiodarone: 626 ng/mL

## 2021-07-21 NOTE — Patient Instructions (Addendum)
Medication Instructions:  ?Your physician has recommended you make the following change in your medication:  ? ?STOP Amiodarone ? ?STOP Amlodipine ?  ?*If you need a refill on your cardiac medications before your next appointment, please call your pharmacy* ? ?Lab Work: ?None ordered today.  ? ?Testing/Procedures: ?Your provider has ordered and xray of your left shoulder.  ? ?Follow-Up: ?At [facility], you and your health needs are our priority.  As part of our continuing mission to provide you with exceptional heart care, we have created designated Provider Care Teams.  These Care Teams include your primary Cardiologist (physician) and Advanced Practice Providers (APPs -  Physician Assistants and Nurse Practitioners) who all work together to provide you with the care you need, when you need it. ? ?We recommend signing up for the patient portal called "MyChart".  Sign up information is provided on this After Visit Summary.  MyChart is used to connect with patients for Virtual Visits (Telemedicine).  Patients are able to view lab/test results, encounter notes, upcoming appointments, etc.  Non-urgent messages can be sent to your provider as well.   ?To learn more about what you can do with MyChart, go to [email or web address].   ? ?Your next appointment:   ?As scheduled with Dr. Oval Linsey.  ?  ?
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
## 2021-07-23 ENCOUNTER — Telehealth (HOSPITAL_BASED_OUTPATIENT_CLINIC_OR_DEPARTMENT_OTHER): Payer: Self-pay

## 2021-07-23 MED ORDER — AMIODARONE HCL 200 MG PO TABS: 200.0000 mg | ORAL_TABLET | Freq: Every day | ORAL | 3 refills | Status: DC

## 2021-07-23 NOTE — Telephone Encounter (Addendum)
RN called using Nepali Interpreter [phone], Called results to patient and left results on VM (ok per DPR), instructions left to call office back if patient has any questions!  ? ? ? ? ? ? ?----- Message from Alver Sorrow, NP sent at 07/23/2021  3:12 PM EDT ----- ?Regarding: RE: ?Thank you! ? ?Rutherford Guys - can you please call him (will need interpretor via phone) to instruct him to resume Amiodarone 200mg  daily per Dr. ? ? ? ? ? ? ?

## 2021-07-24 ENCOUNTER — Telehealth (HOSPITAL_BASED_OUTPATIENT_CLINIC_OR_DEPARTMENT_OTHER): Payer: Self-pay

## 2021-07-24 ENCOUNTER — Telehealth: Payer: Self-pay | Admitting: Licensed Clinical Social Worker
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
## 2021-07-24 NOTE — Telephone Encounter (Addendum)
Patient called using Interpreter # [phone] and the following information was left on patients VM (ok per DPR).  ? ? ? ?----- Message from Loel Dubonnet, NP sent at 07/21/2021  5:05 PM EDT ----- ?Shoulder x-ray with no acute findings. Continue lidocaine patch as prescribed by PCP, OTC Tylenol, heat pack, wear sling. Follow up with orthopedics/PCP. ?

## 2021-07-27 ENCOUNTER — Telehealth: Payer: Self-pay | Admitting: Licensed Clinical Social Worker

## 2021-07-27 NOTE — Telephone Encounter (Signed)
LCSW attempted to reach pt via telephone at [phone] with the assistance of Nepali interpreter via [employer] [phone]. No answer, called twice. Will re-attempt as able.  ? ?Octavio Graves, MSW, LCSW ?Clinical Social Worker II ?[place] [facility]  ?[phone]- work cell phone (preferred) ?[phone]- desk phone ? ?

## 2021-07-31 ENCOUNTER — Ambulatory Visit: Payer: Self-pay | Admitting: Nurse Practitioner

## 2021-08-04 ENCOUNTER — Encounter: Payer: Self-pay | Admitting: Nurse Practitioner

## 2021-08-04 ENCOUNTER — Ambulatory Visit (INDEPENDENT_AMBULATORY_CARE_PROVIDER_SITE_OTHER): Payer: Medicaid Other | Admitting: Nurse Practitioner

## 2021-08-04 VITALS — BP 124/87 | HR 75 | Temp 98.2°F | Ht 67.0 in | Wt 158.1 lb

## 2021-08-04 DIAGNOSIS — Z0000 Encounter for general adult medical examination without abnormal findings: Secondary | ICD-10-CM

## 2021-08-04 DIAGNOSIS — M79602 Pain in left arm: Secondary | ICD-10-CM | POA: Diagnosis not present

## 2021-08-04 DIAGNOSIS — M542 Cervicalgia: Secondary | ICD-10-CM | POA: Diagnosis not present

## 2021-08-04 MED ORDER — FITRITE LUMBAR BACK BRACE MISC: 1.0000 | Freq: Every day | 2 refills | Status: AC | PRN

## 2021-08-04 NOTE — Patient Instructions (Signed)
You were seen today in the [facility] [place] for 3 mths follow up. Labs were collected, results will be available via MyChart or, if abnormal, you will be contacted by clinic staff.  Please follow up in 6 mths for reevaluation musculoskeletal pain  ?
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
## 2021-08-05 LAB — HEMOGLOBIN A1C
Est. average glucose Bld gHb Est-mCnc: 94 mg/dL
Hgb A1c MFr Bld: 4.9 % (ref 4.8–5.6)

## 2021-08-07 ENCOUNTER — Encounter: Payer: Self-pay | Admitting: Orthopaedic Surgery

## 2021-08-07 ENCOUNTER — Ambulatory Visit (INDEPENDENT_AMBULATORY_CARE_PROVIDER_SITE_OTHER): Payer: Medicaid Other | Admitting: Orthopaedic Surgery

## 2021-08-07 DIAGNOSIS — M5020 Other cervical disc displacement, unspecified cervical region: Secondary | ICD-10-CM
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
## 2021-08-07 NOTE — Addendum Note (Signed)
Addended by: Rogers Seeds on: 08/07/2021 02:09 PM   Modules accepted: Orders

## 2021-08-11 ENCOUNTER — Encounter: Payer: Self-pay | Admitting: Cardiology

## 2021-08-11 ENCOUNTER — Ambulatory Visit (HOSPITAL_BASED_OUTPATIENT_CLINIC_OR_DEPARTMENT_OTHER): Payer: Medicaid Other | Admitting: Family

## 2021-08-20 ENCOUNTER — Ambulatory Visit: Payer: Self-pay | Admitting: Nurse Practitioner

## 2021-08-20 ENCOUNTER — Other Ambulatory Visit: Payer: Self-pay | Admitting: Neurology

## 2021-08-24 ENCOUNTER — Encounter: Payer: Self-pay | Admitting: Nurse Practitioner

## 2021-08-24 ENCOUNTER — Ambulatory Visit (INDEPENDENT_AMBULATORY_CARE_PROVIDER_SITE_OTHER): Payer: Medicaid Other | Admitting: Nurse Practitioner

## 2021-08-24 VITALS — BP 137/90 | HR 71 | Temp 97.9°F | Ht 67.0 in | Wt 159.6 lb

## 2021-08-24 DIAGNOSIS — R1013 Epigastric pain: Secondary | ICD-10-CM | POA: Diagnosis not present

## 2021-08-24 MED ORDER — ESOMEPRAZOLE MAGNESIUM 40 MG PO CPDR: 40.0000 mg | DELAYED_RELEASE_CAPSULE | Freq: Every day | ORAL | 3 refills | Status: AC

## 2021-08-24 NOTE — Patient Instructions (Signed)
You were seen today in the [facility] for abdominal pain. Labs were collected, results will be available via MyChart or, if abnormal, you will be contacted by clinic staff. You were prescribed medications, please take as directed. Please follow up in 3 wks for reevaluation of abdominal pain

## 2021-08-24 NOTE — Progress Notes (Unsigned)
[address] [place], [place]  [address] Phone:  [phone]   Fax:  [phone] Subjective:   Patient ID: Seth Snyder, male    DOB: 01-22-1961, 61 y.o.   MRN: [id number]  Chief Complaint  Patient presents with   OFFICE VISIT    Pt stated he is here to have paper work filled out for [employer] pt stated he needs an assessment for [employer]. Pt also stated he has upper abdominal pain for the past 3 month's   HPI Seth Snyder 61 y.o. male  has a past medical history of Anemia, Ascending aortic aneurysm ([place]) (07/10/2021), Atrial fibrillation ([place]), Atypical chest pain (10/01/2020), Dizziness, Hypertension, Labile hypertension (07/10/2021), and Low back pain. To the [facility] for upper abdominal pain x 3 mths.   States that pain occurs in the mid epigastric area, most pronounced after meals and at night. Denies consumption of spicy or fried foods, however; endorses caffeine consumption. States that he currently takes all medications as prescribed, including protonix. Endorses chronic diarrhea with no changes, denies any nausea/ vomiting. Denies any other concerns today.  Denies any fatigue, chest pain, shortness of breath, HA or dizziness. Denies any blurred vision, numbness or tingling.  Past Medical History:  Diagnosis Date   Anemia    Ascending aortic aneurysm ([place]) 07/10/2021   Atrial fibrillation ([place])    Atypical chest pain 10/01/2020   Dizziness    Hypertension    Labile hypertension 07/10/2021   Low back pain     Past Surgical History:  Procedure Laterality Date   CARDIOVERSION N/A 05/10/2019   Procedure: CARDIOVERSION;  Surgeon: Lelon Perla, MD;  Location: [place];  Service: Cardiovascular;  Laterality: N/A;   COLONOSCOPY  07/12/2013   LEFT HEART CATH AND CORONARY ANGIOGRAPHY N/A 08/30/2019   Procedure: LEFT HEART CATH AND CORONARY ANGIOGRAPHY;  Surgeon: Jettie Booze, MD;  Location: [place] CV [facility];   Service: Cardiovascular;  Laterality: N/A;   TEE WITHOUT CARDIOVERSION N/A 05/10/2019   Procedure: TRANSESOPHAGEAL ECHOCARDIOGRAM (TEE);  Surgeon: Lelon Perla, MD;  Location: [facility] ENDOSCOPY;  Service: Cardiovascular;  Laterality: N/A;    Family History  Problem Relation Age of Onset   Diabetes Mother    Other Father        unsure of medical history   Diabetes Sister    Mental illness Sister    Mental illness Brother     Social History   Socioeconomic History   Marital status: Single    Spouse name: Not on file   Number of children: 1   Years of education: 8th grade   Highest education level: Not on file  Occupational History   Occupation: Disabled  Tobacco Use   Smoking status: Former    Types: Cigarettes    Quit date: 12/21/2018    Years since quitting: 2.6   Smokeless tobacco: Never  Vaping Use   Vaping Use: Never used  Substance and Sexual Activity   Alcohol use: Not Currently    Comment: rare   Drug use: Never   Sexual activity: Not Currently  Other Topics Concern   Not on file  Social History Narrative   Right-handed.   Lives at home with his daughter and son-in-law.   No daily use of caffeine.      Social Determinants of Health   Financial Resource Strain: Not on file  Food Insecurity: Not on file  Transportation Needs: Not on file  Physical  Activity: Not on file  Stress: Not on file  Social Connections: Not on file  Intimate Partner Violence: Not on file    Outpatient Medications Prior to Visit  Medication Sig Dispense Refill   acetaminophen (TYLENOL) 500 MG tablet Take 500 mg by mouth as needed.     albuterol (VENTOLIN HFA) 108 (90 Base) MCG/ACT inhaler Inhale 2 puffs into the lungs every 6 (six) hours as needed for wheezing or shortness of breath. 8 g 6   amiodarone (PACERONE) 200 MG tablet Take 1 tablet (200 mg total) by mouth daily. 90 tablet 3   ammonium lactate (AMLACTIN) 12 % cream Apply topically.     ANORO ELLIPTA 62.5-25 MCG/INH AEPB  Inhale 1 puff into the lungs 2 (two) times daily as needed (sob/wheezing).     apixaban (ELIQUIS) 5 MG TABS tablet Take 1 tablet (5 mg total) by mouth 2 (two) times daily. 180 tablet 1   atorvastatin (LIPITOR) 40 MG tablet Take 1 tablet (40 mg total) by mouth daily at 6 PM. 90 tablet 3   carvedilol (COREG) 25 MG tablet Take 1 tablet (25 mg total) by mouth 2 (two) times daily. 180 tablet 3   citalopram (CELEXA) 40 MG tablet Take 1 tablet (40 mg total) by mouth daily. 90 tablet 1   DULoxetine (CYMBALTA) 60 MG capsule Take 1 capsule (60 mg total) by mouth daily. Please call and make overdue appt for further refills, 1st attempt 30 capsule 0   Elastic Bandages & Supports (ADJUSTABLE ARM SLING) MISC 1 application. by Does not apply route daily. 1 each 0   Elastic Bandages & Supports (FITRITE LUMBAR BACK BRACE) MISC 1 each by Does not apply route daily as needed (low back pain). 1 each 2   fluticasone-salmeterol (ADVAIR HFA) 115-21 MCG/ACT inhaler Inhale 2 puffs into the lungs 2 (two) times daily. 1 each 12   fluticasone-salmeterol (ADVAIR HFA) 115-21 MCG/ACT inhaler SMARTSIG:2 Puff(s) Via Inhaler Twice Daily     gabapentin (NEURONTIN) 300 MG capsule Take 3 capsules (900 mg total) by mouth at bedtime. 270 capsule 3   Heating Pads ([employer] HEATING PAD) PADS 1 application by Does not apply route 3 (three) times daily. 15 minute intervals only 1 each 0   hydrALAZINE (APRESOLINE) 25 MG tablet Take 1 tablet (25 mg total) by mouth 2 (two) times daily. 12 HOURS APART 180 tablet 3   hydrOXYzine (ATARAX) 10 MG tablet Take 1 tablet (10 mg total) by mouth 3 (three) times daily as needed. For itch 30 tablet 0   levothyroxine (SYNTHROID) 75 MCG tablet Take 1 tablet (75 mcg total) by mouth daily. 30 tablet 11   lidocaine (LIDODERM) 5 % Place 1 patch onto the skin daily. Remove & Discard patch within 12 hours or as directed by MD 30 patch 2   methocarbamol (ROBAXIN) 500 MG tablet Take 500 mg by mouth as needed.      Oxcarbazepine (TRILEPTAL) 300 MG tablet Take 1 tablet (300 mg total) by mouth at bedtime. X 1 week, then 600 mg nightly x 1 week, then 900 mg nightly- for nerve pain 90 tablet 5   traZODone (DESYREL) 50 MG tablet Take half tablet at bedtime as needed for sleep. 90 tablet 1   triamcinolone ointment (KENALOG) 0.5 % APPLY TO AFFECTED AREA TWICE A DAY 30 g 1   pantoprazole (PROTONIX) 40 MG tablet Take 1 tablet (40 mg total) by mouth daily. 90 tablet 3   No facility-administered medications prior to visit.  Allergies  Allergen Reactions   Entresto [Sacubitril-Valsartan]     Abnormal kidney functions     Review of Systems  Constitutional: Negative.  Negative for chills, fever and malaise/fatigue.  Respiratory:  Negative for cough and shortness of breath.   Cardiovascular:  Negative for chest pain, palpitations and leg swelling.  Gastrointestinal:  Positive for abdominal pain and diarrhea. Negative for blood in stool, constipation, nausea and vomiting.  Skin: Negative.   Neurological: Negative.   Psychiatric/Behavioral:  Negative for depression. The patient is not nervous/anxious.   All other systems reviewed and are negative.     Objective:    Physical Exam Vitals reviewed.  Constitutional:      General: He is not in acute distress.    Appearance: Normal appearance. He is normal weight.  HENT:     Head: Normocephalic.  Neck:     Vascular: No carotid bruit.  Cardiovascular:     Rate and Rhythm: Normal rate and regular rhythm.     Pulses: Normal pulses.     Heart sounds: Normal heart sounds.     Comments: No obvious peripheral edema Pulmonary:     Effort: Pulmonary effort is normal.     Breath sounds: Normal breath sounds.  Abdominal:     General: Abdomen is flat. Bowel sounds are normal. There is no distension.     Palpations: Abdomen is soft. There is no mass.     Tenderness: There is no abdominal tenderness. There is no right CVA tenderness, left CVA tenderness, guarding  or rebound.     Hernia: No hernia is present.  Musculoskeletal:     Cervical back: Normal range of motion and neck supple. No rigidity or tenderness.  Lymphadenopathy:     Cervical: No cervical adenopathy.  Skin:    General: Skin is warm and dry.     Capillary Refill: Capillary refill takes less than 2 seconds.  Neurological:     General: No focal deficit present.     Mental Status: He is alert and oriented to person, place, and time.  Psychiatric:        Mood and Affect: Mood normal.        Behavior: Behavior normal.        Thought Content: Thought content normal.        Judgment: Judgment normal.    BP 137/90 (BP Location: Right Arm, Patient Position: Sitting, Cuff Size: Normal)   Pulse 71   Temp 97.9 F (36.6 C)   Ht 5' 7"  (1.702 m)   Wt 159 lb 9.6 oz (72.4 kg)   SpO2 98%   BMI 25.00 kg/m  Wt Readings from Last 3 Encounters:  08/24/21 159 lb 9.6 oz (72.4 kg)  08/04/21 158 lb 2 oz (71.7 kg)  07/21/21 166 lb 4.8 oz (75.4 kg)    Immunization History  Administered Date(s) Administered   Influenza Split 04/12/2013, 12/13/2013, 04/25/2015   Influenza,inj,Quad PF,6+ Mos 04/06/2016, 05/30/2017, 02/10/2018, 02/14/2019, 03/31/2020   PFIZER(Purple Top)SARS-COV-2 Vaccination 06/18/2019, 07/10/2019, 04/01/2020   Pneumococcal Polysaccharide-23 04/25/2015   Tdap 04/25/2015   Zoster Recombinat (Shingrix) 02/10/2018, 03/12/2019    Diabetic Foot Exam - Simple   No data filed     Lab Results  Component Value Date   TSH 1.580 05/20/2021   Lab Results  Component Value Date   WBC 5.8 08/25/2021   HGB 12.2 (L) 08/25/2021   HCT 36.8 (L) 08/25/2021   MCV 88 08/25/2021   PLT 255 08/25/2021   Lab Results  Component Value Date   NA 135 08/25/2021   K 4.2 08/25/2021   CO2 18 (L) 08/25/2021   GLUCOSE 124 (H) 08/25/2021   BUN 8 08/25/2021   CREATININE 1.18 08/25/2021   BILITOT 0.6 08/25/2021   ALKPHOS 84 08/25/2021   AST 22 08/25/2021   ALT 11 08/25/2021   PROT 7.5  08/25/2021   ALBUMIN 5.1 (H) 08/25/2021   CALCIUM 9.7 08/25/2021   ANIONGAP 7 07/14/2021   EGFR 71 08/25/2021   Lab Results  Component Value Date   CHOL 159 05/20/2021   CHOL 128 10/02/2020   CHOL 94 (L) 08/15/2019   Lab Results  Component Value Date   HDL 69 05/20/2021   HDL 53 10/02/2020   HDL 41 08/15/2019   Lab Results  Component Value Date   LDLCALC 64 05/20/2021   LDLCALC 52 10/02/2020   LDLCALC 35 08/15/2019   Lab Results  Component Value Date   TRIG 156 (H) 05/20/2021   TRIG 131 10/02/2020   TRIG 93 08/15/2019   Lab Results  Component Value Date   CHOLHDL 2.3 05/20/2021   CHOLHDL 2.4 10/02/2020   CHOLHDL 2.3 08/15/2019   Lab Results  Component Value Date   HGBA1C 4.9 08/04/2021       Assessment & Plan:   Problem List Items Addressed This Visit   None Visit Diagnoses     Epigastric pain    -  Primary   Relevant Medications   esomeprazole (NEXIUM) 40 MG capsule, initiated during visit, Protonix discontinued    Other Relevant Orders   CBC with Differential/Platelet (Completed)   Comprehensive metabolic panel (Completed)   Lipase (Completed) Given anticipatory guidance Discussed non pharmacological methods for management of symptoms Informed to take OTC medications as needed    Follow up in 3 wks for reevaluation of abdominal pain, sooner as needed     I have discontinued Seth Snyder's pantoprazole. I am also having him start on esomeprazole. Additionally, I am having him maintain his albuterol, Advair HFA, citalopram, traZODone, Anoro Ellipta, CVS Heating Pad, levothyroxine, gabapentin, atorvastatin, carvedilol, apixaban, hydrOXYzine, Adjustable Arm Sling, lidocaine, acetaminophen, ammonium lactate, methocarbamol, triamcinolone ointment, Oxcarbazepine, hydrALAZINE, fluticasone-salmeterol, amiodarone, Fitrite Lumbar Back Brace, and DULoxetine.  Meds ordered this encounter  Medications   esomeprazole (NEXIUM) 40 MG capsule    Sig: Take 1 capsule  (40 mg total) by mouth daily.    Dispense:  30 capsule    Refill:  3     Teena Dunk, NP

## 2021-08-26 LAB — CBC WITH DIFFERENTIAL/PLATELET
Basophils Absolute: 0 10*3/uL (ref 0.0–0.2)
Basos: 1 %
EOS (ABSOLUTE): 0.2 10*3/uL (ref 0.0–0.4)
Eos: 4 %
Hematocrit: 36.8 % — ABNORMAL LOW (ref 37.5–51.0)
Hemoglobin: 12.2 g/dL — ABNORMAL LOW (ref 13.0–17.7)
Immature Grans (Abs): 0 10*3/uL (ref 0.0–0.1)
Immature Granulocytes: 0 %
Lymphocytes Absolute: 1.5 10*3/uL (ref 0.7–3.1)
Lymphs: 26 %
MCH: 29.3 pg (ref 26.6–33.0)
MCHC: 33.2 g/dL (ref 31.5–35.7)
MCV: 88 fL (ref 79–97)
Monocytes Absolute: 0.6 10*3/uL (ref 0.1–0.9)
Monocytes: 10 %
Neutrophils Absolute: 3.4 10*3/uL (ref 1.4–7.0)
Neutrophils: 59 %
Platelets: 255 10*3/uL (ref 150–450)
RBC: 4.17 x10E6/uL (ref 4.14–5.80)
RDW: 12.7 % (ref 11.6–15.4)
WBC: 5.8 10*3/uL (ref 3.4–10.8)

## 2021-08-26 LAB — COMPREHENSIVE METABOLIC PANEL
ALT: 11 IU/L (ref 0–44)
AST: 22 IU/L (ref 0–40)
Albumin/Globulin Ratio: 2.1 (ref 1.2–2.2)
Albumin: 5.1 g/dL — ABNORMAL HIGH (ref 3.8–4.9)
Alkaline Phosphatase: 84 IU/L (ref 44–121)
BUN/Creatinine Ratio: 7 — ABNORMAL LOW (ref 9–20)
BUN: 8 mg/dL (ref 6–24)
Bilirubin Total: 0.6 mg/dL (ref 0.0–1.2)
CO2: 18 mmol/L — ABNORMAL LOW (ref 20–29)
Calcium: 9.7 mg/dL (ref 8.7–10.2)
Chloride: 99 mmol/L (ref 96–106)
Creatinine, Ser: 1.18 mg/dL (ref 0.76–1.27)
Globulin, Total: 2.4 g/dL (ref 1.5–4.5)
Glucose: 124 mg/dL — ABNORMAL HIGH (ref 70–99)
Potassium: 4.2 mmol/L (ref 3.5–5.2)
Sodium: 135 mmol/L (ref 134–144)
Total Protein: 7.5 g/dL (ref 6.0–8.5)
eGFR: 71 mL/min/{1.73_m2} (ref >59)

## 2021-08-26 LAB — LIPASE: Lipase: 74 U/L (ref 13–78)

## 2021-08-27 ENCOUNTER — Ambulatory Visit: Payer: Medicaid Other | Attending: Orthopaedic Surgery | Admitting: Physical Therapy

## 2021-08-27 ENCOUNTER — Encounter: Payer: Self-pay | Admitting: Physical Therapy

## 2021-08-27 DIAGNOSIS — M6281 Muscle weakness (generalized): Secondary | ICD-10-CM | POA: Insufficient documentation

## 2021-08-27 DIAGNOSIS — R293 Abnormal posture: Secondary | ICD-10-CM | POA: Insufficient documentation

## 2021-08-27 DIAGNOSIS — M5459 Other low back pain: Secondary | ICD-10-CM | POA: Insufficient documentation

## 2021-08-27 DIAGNOSIS — R262 Difficulty in walking, not elsewhere classified: Secondary | ICD-10-CM | POA: Diagnosis not present

## 2021-08-27 DIAGNOSIS — M542 Cervicalgia: Secondary | ICD-10-CM

## 2021-08-27 DIAGNOSIS — M5020 Other cervical disc displacement, unspecified cervical region: Secondary | ICD-10-CM | POA: Insufficient documentation

## 2021-08-27 DIAGNOSIS — R278 Other lack of coordination: Secondary | ICD-10-CM | POA: Diagnosis not present
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
## 2021-09-03 ENCOUNTER — Ambulatory Visit: Payer: Medicaid Other | Admitting: Physical Therapy

## 2021-09-03 DIAGNOSIS — M542 Cervicalgia: Secondary | ICD-10-CM

## 2021-09-03 DIAGNOSIS — R278 Other lack of coordination: Secondary | ICD-10-CM

## 2021-09-03 DIAGNOSIS — M6281 Muscle weakness (generalized): Secondary | ICD-10-CM

## 2021-09-03 DIAGNOSIS — R293 Abnormal posture: Secondary | ICD-10-CM

## 2021-09-03 DIAGNOSIS — M5459 Other low back pain: Secondary | ICD-10-CM

## 2021-09-03 DIAGNOSIS — R262 Difficulty in walking, not elsewhere classified: Secondary | ICD-10-CM

## 2021-09-03 DIAGNOSIS — M5020 Other cervical disc displacement, unspecified cervical region: Secondary | ICD-10-CM | POA: Diagnosis not present
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
## 2021-09-07 ENCOUNTER — Ambulatory Visit: Payer: Self-pay | Admitting: Nurse Practitioner

## 2021-09-09 ENCOUNTER — Ambulatory Visit (INDEPENDENT_AMBULATORY_CARE_PROVIDER_SITE_OTHER): Payer: Medicaid Other | Admitting: Cardiology

## 2021-09-09 ENCOUNTER — Encounter: Payer: Self-pay | Admitting: Cardiology

## 2021-09-09 VITALS — BP 134/80 | HR 67 | Ht 67.0 in | Wt 159.0 lb

## 2021-09-09 DIAGNOSIS — Z79899 Other long term (current) drug therapy: Secondary | ICD-10-CM

## 2021-09-09 DIAGNOSIS — I4819 Other persistent atrial fibrillation: Secondary | ICD-10-CM

## 2021-09-09 DIAGNOSIS — I5022 Chronic systolic (congestive) heart failure: Secondary | ICD-10-CM | POA: Diagnosis not present

## 2021-09-09 NOTE — Patient Instructions (Signed)
Medication Instructions:  ?Your physician recommends that you continue on your current medications as directed. Please refer to the Current Medication list given to you today. ?*If you need a refill on your cardiac medications before your next appointment, please call your pharmacy* ? ?Lab Work: ?None. ?If you have labs (blood work) drawn today and your tests are completely normal, you will receive your results only by: ?MyChart Message (if you have MyChart) OR ?A paper copy in the mail ?If you have any lab test that is abnormal or we need to change your treatment, we will call you to review the results. ? ?Testing/Procedures: ?None. ? ?Follow-Up: ?At [facility], you and your health needs are our priority.  As part of our continuing mission to provide you with exceptional heart care, we have created designated Provider Care Teams.  These Care Teams include your primary Cardiologist (physician) and Advanced Practice Providers (APPs -  Physician Assistants and Nurse Practitioners) who all work together to provide you with the care you need, when you need it. ? ?Your physician wants you to follow-up in: 6 months with the [facility]. They will contact you to schedule.  ? ?We recommend signing up for the patient portal called "MyChart".  Sign up information is provided on this After Visit Summary.  MyChart is used to connect with patients for Virtual Visits (Telemedicine).  Patients are able to view lab/test results, encounter notes, upcoming appointments, etc.  Non-urgent messages can be sent to your provider as well.   ?To learn more about what you can do with MyChart, go to [email or web address].   ? ?Any Other Special Instructions Will Be Listed Below (If Applicable). ? ? ? ? ?  ? ? ?

## 2021-09-09 NOTE — Progress Notes (Signed)
Electrophysiology Office Note:    Date:  09/09/2021   ID:  [place], [place] 05-Dec-1960, MRN [id number]  PCP:  Teena Dunk, NP  [facility] Cardiologist:  Skeet Latch, MD  [facility] Electrophysiologist:  Vickie Epley, MD   Referring MD: Teena Dunk, NP   Chief Complaint: Follow-up  History of Present Illness:    Seth Snyder is a 61 y.o. male who presents for follow-up. Their medical history includes atrial fibrillation, ascending aortic aneurysm, anemia, hypertension,   TEE/cardioversion 04/2019 but had recurrent atrial fibrillation.  He was started on amiodarone.   Previously followed by Dr. Rayann Heman, last seen by him on 03/26/2020. Also follows with Afib clinic.  He was seen by A-fib clinic 07/15/2021 to follow-up on plan to come off amiodarone and consider Tikosyn.  He was instructed to come off of the amiodarone 03/2021 at clinic visit with Oda Kilts, PA and as such amiodarone level was collected.  He was most recently seen in cardiology by Laurann Montana, NP on 07/21/2021. It was noted that he had still been taking amiodarone. He was instructed to stop amiodarone, and continue Eliquis.  Today, he is accompanied by a professional interpreter. He confirms that he has stopped amiodarone.  Lately he has been experiencing worsening shortness of breath.  Also he endorses swelling in his bilateral LE as well as burning sensations in his legs.  They deny any palpitations, or chest pain. No lightheadedness, headaches, syncope, orthopnea, or PND.      Past Medical History:  Diagnosis Date   Anemia    Ascending aortic aneurysm ([place]) 07/10/2021   Atrial fibrillation ([place])    Atypical chest pain 10/01/2020   Dizziness    Hypertension    Labile hypertension 07/10/2021   Low back pain     Past Surgical History:  Procedure Laterality Date   CARDIOVERSION N/A 05/10/2019   Procedure: CARDIOVERSION;  Surgeon: Lelon Perla, MD;  Location: [place] [facility];  Service: Cardiovascular;  Laterality: N/A;   COLONOSCOPY  07/12/2013   LEFT HEART CATH AND CORONARY ANGIOGRAPHY N/A 08/30/2019   Procedure: LEFT HEART CATH AND CORONARY ANGIOGRAPHY;  Surgeon: Jettie Booze, MD;  Location: [facility];  Service: Cardiovascular;  Laterality: N/A;   TEE WITHOUT CARDIOVERSION N/A 05/10/2019   Procedure: TRANSESOPHAGEAL ECHOCARDIOGRAM (TEE);  Surgeon: Lelon Perla, MD;  Location: [facility];  Service: Cardiovascular;  Laterality: N/A;    Current Medications: Current Meds  Medication Sig   acetaminophen (TYLENOL) 500 MG tablet Take 500 mg by mouth as needed.   albuterol (VENTOLIN HFA) 108 (90 Base) MCG/ACT inhaler Inhale 2 puffs into the lungs every 6 (six) hours as needed for wheezing or shortness of breath.   amiodarone (PACERONE) 200 MG tablet Take 1 tablet (200 mg total) by mouth daily.   amLODipine (NORVASC) 2.5 MG tablet Take 2.5 mg by mouth daily.   ammonium lactate (AMLACTIN) 12 % cream Apply topically.   ANORO ELLIPTA 62.5-25 MCG/INH AEPB Inhale 1 puff into the lungs 2 (two) times daily as needed (sob/wheezing).   apixaban (ELIQUIS) 5 MG TABS tablet Take 1 tablet (5 mg total) by mouth 2 (two) times daily.   atorvastatin (LIPITOR) 40 MG tablet Take 1 tablet (40 mg total) by mouth daily at 6 PM.   carvedilol (COREG) 25 MG tablet Take 1 tablet (25 mg total) by mouth 2 (two) times daily.   citalopram (CELEXA) 40 MG tablet Take 1 tablet (40 mg total) by mouth  daily.   DULoxetine (CYMBALTA) 60 MG capsule Take 1 capsule (60 mg total) by mouth daily. Please call and make overdue appt for further refills, 1st attempt   Elastic Bandages & Supports (ADJUSTABLE ARM SLING) MISC 1 application. by Does not apply route daily.   Elastic Bandages & Supports (FITRITE LUMBAR BACK BRACE) MISC 1 each by Does not apply route daily as needed (low back pain).   esomeprazole (NEXIUM) 40 MG capsule Take 1 capsule (40 mg total) by mouth daily.    fluticasone-salmeterol (ADVAIR HFA) 115-21 MCG/ACT inhaler Inhale 2 puffs into the lungs 2 (two) times daily.   fluticasone-salmeterol (ADVAIR HFA) 115-21 MCG/ACT inhaler SMARTSIG:2 Puff(s) Via Inhaler Twice Daily   gabapentin (NEURONTIN) 300 MG capsule Take 3 capsules (900 mg total) by mouth at bedtime.   Heating Pads ([employer] HEATING PAD) PADS 1 application by Does not apply route 3 (three) times daily. 15 minute intervals only   hydrALAZINE (APRESOLINE) 25 MG tablet Take 1 tablet (25 mg total) by mouth 2 (two) times daily. 12 HOURS APART   hydrOXYzine (ATARAX) 10 MG tablet Take 1 tablet (10 mg total) by mouth 3 (three) times daily as needed. For itch   levothyroxine (SYNTHROID) 75 MCG tablet Take 1 tablet (75 mcg total) by mouth daily.   methocarbamol (ROBAXIN) 500 MG tablet Take 500 mg by mouth as needed.   Oxcarbazepine (TRILEPTAL) 300 MG tablet Take 1 tablet (300 mg total) by mouth at bedtime. X 1 week, then 600 mg nightly x 1 week, then 900 mg nightly- for nerve pain   traZODone (DESYREL) 50 MG tablet Take half tablet at bedtime as needed for sleep.   triamcinolone ointment (KENALOG) 0.5 % APPLY TO AFFECTED AREA TWICE A DAY     Allergies:   Entresto [sacubitril-valsartan]   Social History   Socioeconomic History   Marital status: Single    Spouse name: Not on file   Number of children: 1   Years of education: 8th grade   Highest education level: Not on file  Occupational History   Occupation: Disabled  Tobacco Use   Smoking status: Former    Types: Cigarettes    Quit date: 12/21/2018    Years since quitting: 2.7   Smokeless tobacco: Never  Vaping Use   Vaping Use: Never used  Substance and Sexual Activity   Alcohol use: Not Currently    Comment: rare   Drug use: Never   Sexual activity: Not Currently  Other Topics Concern   Not on file  Social History Narrative   Right-handed.   Lives at home with his daughter and son-in-law.   No daily use of caffeine.      Social  Determinants of Health   Financial Resource Strain: Medium Risk (06/24/2020)   Overall Financial Resource Strain (CARDIA)    Difficulty of Paying Living Expenses: Somewhat hard  Food Insecurity: No Food Insecurity (06/24/2020)   Hunger Vital Sign    Worried About Running Out of Food in the Last Year: Never true    Ran Out of Food in the Last Year: Never true  Transportation Needs: No Transportation Needs (06/24/2020)   PRAPARE - Hydrologist (Medical): No    Lack of Transportation (Non-Medical): No  Physical Activity: Not on file  Stress: Not on file  Social Connections: Not on file     Family History: The patient's family history includes Diabetes in his mother and sister; Mental illness in his brother and sister;  Other in his father.  ROS:   Please see the history of present illness.    (+) Shortness of breath (+) Bilateral LE swelling (+) LE burning sensations All other systems reviewed and are negative.  EKGs/Labs/Other Studies Reviewed:    The following studies were reviewed today:  06/12/2021  CTA Chest/Abdomen/Pelvis: FINDINGS: CTA CHEST FINDINGS   Cardiovascular: There is no demonstrable mural hematoma in thoracic aorta in the noncontrast images. There is homogeneous enhancement in thoracic aorta. There is ectasia of ascending thoracic aorta measuring 4 cm. There are no filling defects in the central pulmonary artery branches.   Mediastinum/Nodes: There are subcentimeter nodes in the mediastinum.   Lungs/Pleura: There is no focal pulmonary consolidation. Small linear densities in the lower lung fields may suggest scarring or subsegmental atelectasis. There is no pleural effusion or pneumothorax.   Musculoskeletal: No focal abnormality is seen in bony structures in the thorax.   Review of the MIP images confirms the above findings.   CTA ABDOMEN AND PELVIS FINDINGS   VASCULAR   Aorta: Abdominal aorta is of normal caliber. There is  no focal aneurysmal dilation. There is no evidence of dissection.   Celiac: Unremarkable.   SMA: Unremarkable.   Renals: 2 right renal arteries are seen.  Renal arteries are patent.   IMA: Patent   Veins: Unremarkable.   Review of the MIP images confirms the above findings.   NON-VASCULAR   Hepatobiliary:There is fatty infiltration in the liver. No focal abnormality is seen. Gallbladder is distended. There is no wall thickening in gallbladder. There is no dilation of bile ducts.   Pancreas: No focal abnormality is seen.   Spleen: Unremarkable.   Adrenals/Urinary Tract: Adrenals are unremarkable. There is no hydronephrosis. There are no renal or ureteral stones. Urinary bladder is not distended. There is diffuse wall thickening in the urinary bladder.   Stomach/Bowel: Small hiatal hernia is seen. Small bowel loops are not dilated. Appendix is unremarkable. Scattered diverticula are seen in the colon. There is no evidence of focal acute diverticulitis.   Lymphatic: Unremarkable.   Reproductive: Unremarkable.   Other: There is no ascites or pneumoperitoneum. Small left inguinal hernia containing fat is seen. There is possible tiny right inguinal hernia containing fat.   Musculoskeletal: Unremarkable.   Review of the MIP images confirms the above findings.   IMPRESSION: There is no evidence of dissection in the thoracic and abdominal aorta. There is no evidence of mediastinal or retroperitoneal hematoma. Major branches of thoracic and abdominal aorta are patent. There is ectasia of ascending thoracic aorta measuring 4 cm. There are no intraluminal filling defects in the central pulmonary artery branches.   There is no focal pulmonary consolidation. There is no pleural effusion or pneumothorax.   There is no evidence of intestinal obstruction or pneumoperitoneum. Appendix is not dilated. There is no hydronephrosis.   There is diffuse wall thickening in the  urinary bladder which may be due to incomplete distention or suggest cystitis.   Fatty liver.  Diverticulosis of colon.  Small hiatal hernia.   Other findings as described in the body of the report.  04/16/2021  Echocardiogram:  1. Left ventricular ejection fraction, by estimation, is 60 to 65%. The  left ventricle has normal function. The left ventricle has no regional  wall motion abnormalities. Left ventricular diastolic parameters are  consistent with Grade I diastolic  dysfunction (impaired relaxation). The average left ventricular global  longitudinal strain is -16.3 %. The global longitudinal strain is  abnormal.   2. Right ventricular systolic function is normal. The right ventricular  size is normal.   3. The mitral valve is normal in structure. Trivial mitral valve  regurgitation. No evidence of mitral stenosis.   4. The aortic valve is tricuspid. Aortic valve regurgitation is mild. No  aortic stenosis is present.   5. Aortic dilatation noted. There is mild dilatation of the aortic root,  measuring 40 mm. There is mild dilatation of the ascending aorta,  measuring 42 mm. There is mild dilatation of the aortic arch, measuring 39  mm.   6. The inferior vena cava is normal in size with greater than 50%  respiratory variability, suggesting right atrial pressure of 3 mmHg.   Comparison(s): No significant change from prior study. EF 60%, ascending  aorta 68mm, AI PHT 458 msec.   08/30/2019  Left Heart Cath: There is severe left ventricular systolic dysfunction. The left ventricular ejection fraction is less than 25% by visual estimate. LV end diastolic pressure is normal. LVEDP 12 mm Hg. There is no aortic valve stenosis. No significant CAD. Severe vasospasm of right radial artery, even with 4 Fr catheter. Would not use the right radial artery if cath was needed in the future.   Medical therapy for nonischemic cardiomyopathy.   Resume Eliquis tomorrow morning.   EKG:   EKG  is personally reviewed.  09/09/2021: Normal sinus rhythm.   Recent Labs: 05/20/2021: TSH 1.580 06/12/2021: B Natriuretic Peptide 46.9 08/25/2021: ALT 11; BUN 8; Creatinine, Deontra 1.18; Hemoglobin 12.2; Platelets 255; Potassium 4.2; Sodium 135   Recent Lipid Panel    Component Value Date/Time   CHOL 159 05/20/2021 1153   TRIG 156 (H) 05/20/2021 1153   HDL 69 05/20/2021 1153   CHOLHDL 2.3 05/20/2021 1153   CHOLHDL 2.8 05/07/2019 0823   VLDL 17 05/07/2019 0823   LDLCALC 64 05/20/2021 1153    Physical Exam:    VS:  BP 134/80 (BP Location: Left Arm, Patient Position: Sitting, Cuff Size: Normal)   Pulse 67   Ht 5\' 7"  (1.702 m)   Wt 159 lb (72.1 kg)   BMI 24.90 kg/m     Wt Readings from Last 3 Encounters:  09/09/21 159 lb (72.1 kg)  08/24/21 159 lb 9.6 oz (72.4 kg)  08/04/21 158 lb 2 oz (71.7 kg)     GEN: Well nourished, well developed in no acute distress HEENT: Normal NECK: No JVD; No carotid bruits LYMPHATICS: No lymphadenopathy CARDIAC: RRR, no murmurs, rubs, gallops RESPIRATORY:  Clear to auscultation without rales, wheezing or rhonchi  ABDOMEN: Soft, non-tender, non-distended MUSCULOSKELETAL:  No edema; No deformity  SKIN: Warm and dry NEUROLOGIC:  Alert and oriented x 3 PSYCHIATRIC:  Normal affect       ASSESSMENT:    1. Persistent atrial fibrillation (HCC)   2. Chronic systolic heart failure (HCC)   3. Encounter for long-term (current) use of high-risk medication    PLAN:    In order of problems listed above:  #Persistent AF Maintaining sinus rhythm on amiodarone. On eliquis for stroke ppx. AST/ALT 2 week ago OK. TSH checked in march and stable.  #Chronic systolic HF NYHA II. Warm and dry on exam. EF 60 on January 2023 echo. Continue coreg, hydralazine.    Follow-up in 6 months with Afib clinic.  Medication Adjustments/Labs and Tests Ordered: Current medicines are reviewed at length with the patient today.  Concerns regarding medicines are outlined  above.  No orders of the defined types were placed  in this encounter.  No orders of the defined types were placed in this encounter.   I,Mathew Stumpf,acting as a Education administrator for Vickie Epley, MD.,have documented all relevant documentation on the behalf of Vickie Epley, MD,as directed by  Vickie Epley, MD while in the presence of Vickie Epley, MD.  I, Vickie Epley, MD, have reviewed all documentation for this visit. The documentation on 09/09/21 for the exam, diagnosis, procedures, and orders are all accurate and complete.   Signed, Hilton Cork. Quentin Ore, MD, [facility] 09/09/2021 7:49 PM    Electrophysiology [facility]

## 2021-09-10 ENCOUNTER — Ambulatory Visit: Payer: Medicaid Other

## 2021-09-10 DIAGNOSIS — R293 Abnormal posture: Secondary | ICD-10-CM

## 2021-09-10 DIAGNOSIS — M5459 Other low back pain: Secondary | ICD-10-CM

## 2021-09-10 DIAGNOSIS — M5020 Other cervical disc displacement, unspecified cervical region: Secondary | ICD-10-CM | POA: Diagnosis not present

## 2021-09-10 DIAGNOSIS — R278 Other lack of coordination: Secondary | ICD-10-CM

## 2021-09-10 DIAGNOSIS — M6281 Muscle weakness (generalized): Secondary | ICD-10-CM

## 2021-09-10 DIAGNOSIS — R262 Difficulty in walking, not elsewhere classified: Secondary | ICD-10-CM

## 2021-09-10 DIAGNOSIS — M542 Cervicalgia: Secondary | ICD-10-CM
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
## 2021-09-12 ENCOUNTER — Other Ambulatory Visit: Payer: Self-pay | Admitting: Neurology

## 2021-09-15 ENCOUNTER — Encounter: Payer: Self-pay | Admitting: Nurse Practitioner

## 2021-09-15 ENCOUNTER — Ambulatory Visit (INDEPENDENT_AMBULATORY_CARE_PROVIDER_SITE_OTHER): Payer: Medicaid Other | Admitting: Nurse Practitioner

## 2021-09-15 VITALS — BP 130/83 | HR 71 | Temp 98.4°F | Wt 159.2 lb

## 2021-09-15 DIAGNOSIS — R1013 Epigastric pain: Secondary | ICD-10-CM | POA: Diagnosis not present

## 2021-09-15 NOTE — Progress Notes (Signed)
[address] [place], [place]  [address] Phone:  [phone]   Fax:  [phone] Subjective:   Patient ID: Seth Snyder, male    DOB: [birth year], 61 y.o.   MRN: [id number]  Chief Complaint  Patient presents with   Follow-up   HPI Seth Snyder 61 y.o. male  has a past medical history of Anemia, Ascending aortic aneurysm ([place]) (07/10/2021), Atrial fibrillation ([place]), Atypical chest pain (10/01/2020), Dizziness, Hypertension, Labile hypertension (07/10/2021), and Low back pain. To the [facility] for reevaluation of epigastric/ abdominal pain.   States that since previous visit, symptoms have remained unchanged. Verbalizes taking all medications as prescribed. Denies any nausea/ vomiting , but endorses intermittent diarrhea. Denies any other concerns today.   Denies any fatigue, chest pain, shortness of breath, HA or dizziness. Denies any blurred vision, numbness or tingling.   Past Medical History:  Diagnosis Date   Anemia    Ascending aortic aneurysm ([place]) 07/10/2021   Atrial fibrillation ([place])    Atypical chest pain 10/01/2020   Dizziness    Hypertension    Labile hypertension 07/10/2021   Low back pain     Past Surgical History:  Procedure Laterality Date   CARDIOVERSION N/A 05/10/2019   Procedure: CARDIOVERSION;  Surgeon: Lelon Perla, MD;  Location: [place];  Service: Cardiovascular;  Laterality: N/A;   COLONOSCOPY  07/12/2013   LEFT HEART CATH AND CORONARY ANGIOGRAPHY N/A 08/30/2019   Procedure: LEFT HEART CATH AND CORONARY ANGIOGRAPHY;  Surgeon: Jettie Booze, MD;  Location: [place] CV LAB;  Service: Cardiovascular;  Laterality: N/A;   TEE WITHOUT CARDIOVERSION N/A 05/10/2019   Procedure: TRANSESOPHAGEAL ECHOCARDIOGRAM (TEE);  Surgeon: Lelon Perla, MD;  Location: [facility] ENDOSCOPY;  Service: Cardiovascular;  Laterality: N/A;    Family History  Problem Relation Age of Onset   Diabetes Mother    Other Father         unsure of medical history   Diabetes Sister    Mental illness Sister    Mental illness Brother     Social History   Socioeconomic History   Marital status: Single    Spouse name: Not on file   Number of children: 1   Years of education: 8th grade   Highest education level: Not on file  Occupational History   Occupation: Disabled  Tobacco Use   Smoking status: Former    Types: Cigarettes    Quit date: 12/21/2018    Years since quitting: 2.7   Smokeless tobacco: Never  Vaping Use   Vaping Use: Never used  Substance and Sexual Activity   Alcohol use: Not Currently    Comment: rare   Drug use: Never   Sexual activity: Not Currently  Other Topics Concern   Not on file  Social History Narrative   Right-handed.   Lives at home with his daughter and son-in-law.   No daily use of caffeine.      Social Determinants of Health   Financial Resource Strain: Medium Risk (06/24/2020)   Overall Financial Resource Strain (CARDIA)    Difficulty of Paying Living Expenses: Somewhat hard  Food Insecurity: No Food Insecurity (06/24/2020)   Hunger Vital Sign    Worried About Running Out of Food in the Last Year: Never true    Ran Out of Food in the Last Year: Never true  Transportation Needs: No Transportation Needs (06/24/2020)   PRAPARE - Hydrologist (Medical): No  Lack of Transportation (Non-Medical): No  Physical Activity: Not on file  Stress: Not on file  Social Connections: Not on file  Intimate Partner Violence: Not on file    Outpatient Medications Prior to Visit  Medication Sig Dispense Refill   acetaminophen (TYLENOL) 500 MG tablet Take 500 mg by mouth as needed.     albuterol (VENTOLIN HFA) 108 (90 Base) MCG/ACT inhaler Inhale 2 puffs into the lungs every 6 (six) hours as needed for wheezing or shortness of breath. 8 g 6   amiodarone (PACERONE) 200 MG tablet Take 1 tablet (200 mg total) by mouth daily. 90 tablet 3   amLODipine (NORVASC) 2.5 MG  tablet Take 2.5 mg by mouth daily.     ammonium lactate (AMLACTIN) 12 % cream Apply topically.     ANORO ELLIPTA 62.5-25 MCG/INH AEPB Inhale 1 puff into the lungs 2 (two) times daily as needed (sob/wheezing).     apixaban (ELIQUIS) 5 MG TABS tablet Take 1 tablet (5 mg total) by mouth 2 (two) times daily. 180 tablet 1   atorvastatin (LIPITOR) 40 MG tablet Take 1 tablet (40 mg total) by mouth daily at 6 PM. 90 tablet 3   carvedilol (COREG) 25 MG tablet Take 1 tablet (25 mg total) by mouth 2 (two) times daily. 180 tablet 3   citalopram (CELEXA) 40 MG tablet Take 1 tablet (40 mg total) by mouth daily. 90 tablet 1   DULoxetine (CYMBALTA) 60 MG capsule Take 1 capsule (60 mg total) by mouth daily. Please call and make overdue appt for further refills, 1st attempt 30 capsule 0   Elastic Bandages & Supports (ADJUSTABLE ARM SLING) MISC 1 application. by Does not apply route daily. 1 each 0   Elastic Bandages & Supports (FITRITE LUMBAR BACK BRACE) MISC 1 each by Does not apply route daily as needed (low back pain). 1 each 2   esomeprazole (NEXIUM) 40 MG capsule Take 1 capsule (40 mg total) by mouth daily. 30 capsule 3   fluticasone-salmeterol (ADVAIR HFA) 115-21 MCG/ACT inhaler Inhale 2 puffs into the lungs 2 (two) times daily. 1 each 12   fluticasone-salmeterol (ADVAIR HFA) 115-21 MCG/ACT inhaler SMARTSIG:2 Puff(s) Via Inhaler Twice Daily     gabapentin (NEURONTIN) 300 MG capsule Take 3 capsules (900 mg total) by mouth at bedtime. 270 capsule 3   Heating Pads ([employer] HEATING PAD) PADS 1 application by Does not apply route 3 (three) times daily. 15 minute intervals only 1 each 0   hydrALAZINE (APRESOLINE) 25 MG tablet Take 1 tablet (25 mg total) by mouth 2 (two) times daily. 12 HOURS APART 180 tablet 3   hydrOXYzine (ATARAX) 10 MG tablet Take 1 tablet (10 mg total) by mouth 3 (three) times daily as needed. For itch 30 tablet 0   levothyroxine (SYNTHROID) 75 MCG tablet Take 1 tablet (75 mcg total) by mouth daily.  30 tablet 11   methocarbamol (ROBAXIN) 500 MG tablet Take 500 mg by mouth as needed.     Oxcarbazepine (TRILEPTAL) 300 MG tablet Take 1 tablet (300 mg total) by mouth at bedtime. X 1 week, then 600 mg nightly x 1 week, then 900 mg nightly- for nerve pain 90 tablet 5   traZODone (DESYREL) 50 MG tablet Take half tablet at bedtime as needed for sleep. 90 tablet 1   triamcinolone ointment (KENALOG) 0.5 % APPLY TO AFFECTED AREA TWICE A DAY 30 g 1   No facility-administered medications prior to visit.    Allergies  Allergen Reactions  Entresto [Sacubitril-Valsartan]     Abnormal kidney functions     Review of Systems  Constitutional:  Negative for chills, fever and malaise/fatigue.  HENT: Negative.    Eyes: Negative.   Respiratory:  Negative for cough and shortness of breath.   Cardiovascular:  Negative for chest pain, palpitations and leg swelling.  Gastrointestinal:  Positive for diarrhea and heartburn. Negative for abdominal pain, blood in stool, constipation, nausea and vomiting.  Skin: Negative.   Psychiatric/Behavioral:  Negative for depression. The patient is not nervous/anxious.   All other systems reviewed and are negative.      Objective:    Physical Exam Vitals reviewed.  Constitutional:      General: Seth Snyder is not in acute distress.    Appearance: Normal appearance. Seth Snyder is normal weight.  HENT:     Head: Normocephalic.  Neck:     Vascular: No carotid bruit.  Cardiovascular:     Rate and Rhythm: Normal rate and regular rhythm.     Pulses: Normal pulses.     Heart sounds: Normal heart sounds.     Comments: No obvious peripheral edema Pulmonary:     Effort: Pulmonary effort is normal.     Breath sounds: Normal breath sounds.  Abdominal:     General: Abdomen is flat. Bowel sounds are normal. There is no distension.     Palpations: Abdomen is soft. There is no mass.     Tenderness: There is no abdominal tenderness. There is no right CVA tenderness, left CVA tenderness,  guarding or rebound.     Hernia: No hernia is present.  Musculoskeletal:     Cervical back: Normal range of motion and neck supple. No rigidity or tenderness.  Lymphadenopathy:     Cervical: No cervical adenopathy.  Skin:    General: Skin is warm and dry.     Capillary Refill: Capillary refill takes less than 2 seconds.  Neurological:     General: No focal deficit present.     Mental Status: Seth Snyder is alert and oriented to person, place, and time.  Psychiatric:        Mood and Affect: Mood normal.        Behavior: Behavior normal.        Thought Content: Thought content normal.        Judgment: Judgment normal.     BP 130/83 (BP Location: Right Arm, Patient Position: Sitting, Cuff Size: Normal)   Pulse 71   Temp 98.4 F (36.9 C) (Temporal)   Wt 159 lb 3.2 oz (72.2 kg)   SpO2 96%   BMI 24.93 kg/m  Wt Readings from Last 3 Encounters:  09/15/21 159 lb 3.2 oz (72.2 kg)  09/09/21 159 lb (72.1 kg)  08/24/21 159 lb 9.6 oz (72.4 kg)    Immunization History  Administered Date(s) Administered   Influenza Split 04/12/2013, 12/13/2013, 04/25/2015   Influenza,inj,Quad PF,6+ Mos 04/06/2016, 05/30/2017, 02/10/2018, 02/14/2019, 03/31/2020   PFIZER(Purple Top)SARS-COV-2 Vaccination 06/18/2019, 07/10/2019, 04/01/2020   Pneumococcal Polysaccharide-23 04/25/2015   Tdap 04/25/2015   Zoster Recombinat (Shingrix) 02/10/2018, 03/12/2019    Diabetic Foot Exam - Simple   No data filed     Lab Results  Component Value Date   TSH 1.580 05/20/2021   Lab Results  Component Value Date   WBC 5.8 08/25/2021   HGB 12.2 (L) 08/25/2021   HCT 36.8 (L) 08/25/2021   MCV 88 08/25/2021   PLT 255 08/25/2021   Lab Results  Component Value Date   NA 135  08/25/2021   K 4.2 08/25/2021   CO2 18 (L) 08/25/2021   GLUCOSE 124 (H) 08/25/2021   BUN 8 08/25/2021   CREATININE 1.18 08/25/2021   BILITOT 0.6 08/25/2021   ALKPHOS 84 08/25/2021   AST 22 08/25/2021   ALT 11 08/25/2021   PROT 7.5 08/25/2021    ALBUMIN 5.1 (H) 08/25/2021   CALCIUM 9.7 08/25/2021   ANIONGAP 7 07/14/2021   EGFR 71 08/25/2021   Lab Results  Component Value Date   CHOL 159 05/20/2021   CHOL 128 10/02/2020   CHOL 94 (L) 08/15/2019   Lab Results  Component Value Date   HDL 69 05/20/2021   HDL 53 10/02/2020   HDL 41 08/15/2019   Lab Results  Component Value Date   LDLCALC 64 05/20/2021   LDLCALC 52 10/02/2020   LDLCALC 35 08/15/2019   Lab Results  Component Value Date   TRIG 156 (H) 05/20/2021   TRIG 131 10/02/2020   TRIG 93 08/15/2019   Lab Results  Component Value Date   CHOLHDL 2.3 05/20/2021   CHOLHDL 2.4 10/02/2020   CHOLHDL 2.3 08/15/2019   Lab Results  Component Value Date   HGBA1C 4.9 08/04/2021       Assessment & Plan:   Problem List Items Addressed This Visit   None Visit Diagnoses     Epigastric pain    -  Primary   Relevant Orders   Ambulatory referral to Gastroenterology Encouraged to continue taking medications as prescribed Discussed non pharmacological methods for management of symptoms Informed to take OTC medications as needed Encouraged to maintain follow up with specialist  Discussed diet at length  Follow up in 6 mths for wellness visit, sooner as needed        I am having Seth Snyder maintain his albuterol, Advair HFA, citalopram, traZODone, Anoro Ellipta, [employer] Heating Pad, levothyroxine, gabapentin, atorvastatin, carvedilol, apixaban, hydrOXYzine, Adjustable Arm Sling, acetaminophen, ammonium lactate, methocarbamol, triamcinolone ointment, Oxcarbazepine, hydrALAZINE, fluticasone-salmeterol, amiodarone, Fitrite Lumbar Back Brace, DULoxetine, esomeprazole, and amLODipine.  No orders of the defined types were placed in this encounter.    Teena Dunk, NP

## 2021-09-29 ENCOUNTER — Ambulatory Visit (INDEPENDENT_AMBULATORY_CARE_PROVIDER_SITE_OTHER): Payer: Medicare Other | Admitting: Orthopaedic Surgery

## 2021-09-29 ENCOUNTER — Encounter: Payer: Self-pay | Admitting: Orthopaedic Surgery

## 2021-09-29 VITALS — BP 129/81 | HR 61 | Ht 67.0 in | Wt 159.0 lb

## 2021-09-29 DIAGNOSIS — M5020 Other cervical disc displacement, unspecified cervical region: Secondary | ICD-10-CM | POA: Diagnosis not present

## 2021-09-29 DIAGNOSIS — M4802 Spinal stenosis, cervical region: Secondary | ICD-10-CM
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
## 2021-10-02 ENCOUNTER — Encounter
Payer: Medicare Other | Attending: Physical Medicine and Rehabilitation | Admitting: Physical Medicine and Rehabilitation

## 2021-10-02 ENCOUNTER — Encounter: Payer: Self-pay | Admitting: Physical Medicine and Rehabilitation

## 2021-10-02 DIAGNOSIS — R200 Anesthesia of skin: Secondary | ICD-10-CM | POA: Diagnosis not present

## 2021-10-02 DIAGNOSIS — R202 Paresthesia of skin: Secondary | ICD-10-CM | POA: Diagnosis not present

## 2021-10-02 MED ORDER — GABAPENTIN 300 MG PO CAPS: 900.0000 mg | ORAL_CAPSULE | Freq: Every day | ORAL | 3 refills | Status: DC

## 2021-10-02 NOTE — Progress Notes (Signed)
Subjective:    Patient ID: Seth Snyder, male    DOB: Nov 30, 1960, 61 y.o.   MRN: [id number]  HPI  Pt is a 61 yr old male with hx of  (no DM);  Afib?- on Amiodarone; HTN; hypothyroidism; and on Eliquis; so cannot have Epidural steroid injections; also depression and nerve/back pain; here for evaluation of neck and back pain with radiculopathy/sciatica.  Here for f/u on pain.   Says needs refill on gabapentin-  Taking Trileptal 600 mg QHS-  doesn't make him feel good to take all at once- so takes 1 tab in AM and 2 tabs at night-   Gabapentin and tylenol and therapy feels better- but then gets worse again 2-3 days later.   Going to PT- 1x/week.  Doing HEP- 2x/day  Has to rescheduled PT again- missed an appointment.   Pain Inventory Average Pain 8 Pain Right Now 8 My pain is sharp, burning, and tingling  In the last 24 hours, has pain interfered with the following? General activity 7 Relation with others 6 Enjoyment of life 2 What TIME of day is your pain at its worst? morning , daytime, evening, and night Sleep (in general) Fair  Pain is worse with: bending, sitting, standing, and some activites Pain improves with: heat/ice, therapy/exercise, and medication Relief from Meds: 5  Family History  Problem Relation Age of Onset   Diabetes Mother    Other Father        unsure of medical history   Diabetes Sister    Mental illness Sister    Mental illness Brother    Social History   Socioeconomic History   Marital status: Single    Spouse name: Not on file   Number of children: 1   Years of education: 8th grade   Highest education level: Not on file  Occupational History   Occupation: Disabled  Tobacco Use   Smoking status: Former    Types: Cigarettes    Quit date: 12/21/2018    Years since quitting: 2.7   Smokeless tobacco: Never  Vaping Use   Vaping Use: Never used  Substance and Sexual Activity   Alcohol use: Not Currently    Comment: rare   Drug use:  Never   Sexual activity: Not Currently  Other Topics Concern   Not on file  Social History Narrative   Right-handed.   Lives at home with his daughter and son-in-law.   No daily use of caffeine.      Social Determinants of Health   Financial Resource Strain: Medium Risk (06/24/2020)   Overall Financial Resource Strain (CARDIA)    Difficulty of Paying Living Expenses: Somewhat hard  Food Insecurity: No Food Insecurity (06/24/2020)   Hunger Vital Sign    Worried About Running Out of Food in the Last Year: Never true    Ran Out of Food in the Last Year: Never true  Transportation Needs: No Transportation Needs (06/24/2020)   PRAPARE - Administrator, Civil Service (Medical): No    Lack of Transportation (Non-Medical): No  Physical Activity: Not on file  Stress: Not on file  Social Connections: Not on file   Past Surgical History:  Procedure Laterality Date   CARDIOVERSION N/A 05/10/2019   Procedure: CARDIOVERSION;  Surgeon: Lewayne Bunting, MD;  Location: [facility] ENDOSCOPY;  Service: Cardiovascular;  Laterality: N/A;   COLONOSCOPY  07/12/2013   LEFT HEART CATH AND CORONARY ANGIOGRAPHY N/A 08/30/2019   Procedure: LEFT HEART CATH AND CORONARY  ANGIOGRAPHY;  Surgeon: Jettie Booze, MD;  Location: [place] CV LAB;  Service: Cardiovascular;  Laterality: N/A;   TEE WITHOUT CARDIOVERSION N/A 05/10/2019   Procedure: TRANSESOPHAGEAL ECHOCARDIOGRAM (TEE);  Surgeon: Lelon Perla, MD;  Location: [facility] ENDOSCOPY;  Service: Cardiovascular;  Laterality: N/A;   Past Surgical History:  Procedure Laterality Date   CARDIOVERSION N/A 05/10/2019   Procedure: CARDIOVERSION;  Surgeon: Lelon Perla, MD;  Location: [facility] ENDOSCOPY;  Service: Cardiovascular;  Laterality: N/A;   COLONOSCOPY  07/12/2013   LEFT HEART CATH AND CORONARY ANGIOGRAPHY N/A 08/30/2019   Procedure: LEFT HEART CATH AND CORONARY ANGIOGRAPHY;  Surgeon: Jettie Booze, MD;  Location: [place] CV LAB;  Service:  Cardiovascular;  Laterality: N/A;   TEE WITHOUT CARDIOVERSION N/A 05/10/2019   Procedure: TRANSESOPHAGEAL ECHOCARDIOGRAM (TEE);  Surgeon: Lelon Perla, MD;  Location: [facility] ENDOSCOPY;  Service: Cardiovascular;  Laterality: N/A;   Past Medical History:  Diagnosis Date   Anemia    Ascending aortic aneurysm ([place]) 07/10/2021   Atrial fibrillation ([place])    Atypical chest pain 10/01/2020   Dizziness    Hypertension    Labile hypertension 07/10/2021   Low back pain    BP 126/80   Pulse 66   Ht 5\' 7"  (1.702 m)   Wt 158 lb 9.6 oz (71.9 kg)   SpO2 94%   BMI 24.84 kg/m   Opioid Risk Score:   Fall Risk Score:  `1  Depression screen PHQ 2/9     10/02/2021    2:23 PM 08/24/2021    3:49 PM 06/19/2021    3:15 PM 05/20/2021   10:48 AM 03/09/2021    1:40 PM 02/16/2021    1:17 PM 01/29/2021    9:03 AM  Depression screen PHQ 2/9  Decreased Interest 0 0 1 0 0 3   Down, Depressed, Hopeless 0 0 1 0 1 0 1  PHQ - 2 Score 0 0 2 0 1 3 1   Altered sleeping     1 3 1   Tired, decreased energy     3 3 3   Change in appetite     1 0 1  Feeling bad or failure about yourself      2 0 3  Trouble concentrating     3 0 0  Moving slowly or fidgety/restless     0 3 1  Suicidal thoughts     2 0 1  PHQ-9 Score     13 12 11   Difficult doing work/chores      Somewhat difficult      Review of Systems  Constitutional: Negative.   HENT: Negative.    Eyes: Negative.   Respiratory: Negative.    Cardiovascular: Negative.   Gastrointestinal: Negative.   Endocrine: Negative.   Genitourinary: Negative.   Musculoskeletal:  Positive for back pain, gait problem and neck pain.  Skin: Negative.   Allergic/Immunologic: Negative.   Hematological: Negative.   Psychiatric/Behavioral: Negative.        Objective:   Physical Exam  Awake, alert, appropriate, interpretor in room, NAD Using rolator to walk; Also TTP over low back R side paraspinals  Has TTP over R pec Has TTP over anterior shoulders c/w biceps  tendinitis B/L Also TTP with tight trigger point sin neck- paraspinals     Assessment & Plan:   Pt is a 61 yr old male with hx of  (no DM);  Afib?- on Amiodarone; HTN; hypothyroidism; and on Eliquis; so cannot have Epidural steroid injections;  also depression and nerve/back pain; here for evaluation of neck and back pain with radiculopathy/sciatica.  Here for f/u on pain.  Seeing PCP for stomach pain.   Send in Rx for gabapentin refill- 900 mg QHS-   2.   Wants to continue Trileptal- but not sure if helping. - will give 3 months more and if doesn't help, will try something else. Has another 3 months on refills.    3. Needs to see PCP for stomach pain. Has referral to GI for this.    4. F/U in 74months- f/u  Doesn't want surgery on neck/back

## 2021-10-02 NOTE — Patient Instructions (Signed)
Pt is a 61 yr old male with hx of  (no DM);  Afib?- on Amiodarone; HTN; hypothyroidism; and on Eliquis; so cannot have Epidural steroid injections; also depression and nerve/back pain; here for evaluation of neck and back pain with radiculopathy/sciatica.  Here for f/u on pain.  Seeing PCP for stomach pain.   Send in Rx for gabapentin refill- 900 mg QHS-   2.   Wants to continue Trileptal- but not sure if helping. - will give 3 months more and if doesn't help, will try something else. Has another 3 months on refills.    3. Needs to see PCP for stomach pain. Has referral to GI for this.    4. F/U in 67months- f/u

## 2021-10-12 ENCOUNTER — Encounter: Payer: Self-pay | Admitting: Gastroenterology

## 2021-10-19 ENCOUNTER — Ambulatory Visit (INDEPENDENT_AMBULATORY_CARE_PROVIDER_SITE_OTHER): Payer: Medicare Other | Admitting: Nurse Practitioner

## 2021-10-19 ENCOUNTER — Encounter: Payer: Self-pay | Admitting: Nurse Practitioner

## 2021-10-19 VITALS — BP 154/92 | HR 84 | Temp 98.4°F | Resp 17 | Wt 159.2 lb

## 2021-10-19 DIAGNOSIS — H6692 Otitis media, unspecified, left ear: Secondary | ICD-10-CM | POA: Diagnosis not present

## 2021-10-19 DIAGNOSIS — Z87891 Personal history of nicotine dependence: Secondary | ICD-10-CM | POA: Diagnosis not present

## 2021-10-19 DIAGNOSIS — G8929 Other chronic pain: Secondary | ICD-10-CM

## 2021-10-19 DIAGNOSIS — H9202 Otalgia, left ear: Secondary | ICD-10-CM

## 2021-10-19 MED ORDER — CEFDINIR 300 MG PO CAPS: 300.0000 mg | ORAL_CAPSULE | Freq: Two times a day (BID) | ORAL | 0 refills | Status: AC

## 2021-10-19 MED ORDER — LEVOTHYROXINE SODIUM 75 MCG PO TABS: 75.0000 ug | ORAL_TABLET | Freq: Every day | ORAL | 11 refills | Status: AC

## 2021-10-19 NOTE — Assessment & Plan Note (Signed)
-   Ambulatory Referral Lung Cancer Screening [facility]  2. Left otitis media, unspecified otitis media type  - cefdinir (OMNICEF) 300 MG capsule; Take 1 capsule (300 mg total) by mouth 2 (two) times daily for 10 days.  Dispense: 20 capsule; Refill: 0  3. Chronic left ear pain  - Ambulatory referral to ENT   Follow up:  Follow up in 6 months or sooner if needed

## 2021-10-19 NOTE — Patient Instructions (Addendum)
1. Former smoker  - Ambulatory Referral Lung Cancer Screening [facility]  2. Left otitis media, unspecified otitis media type  - cefdinir (OMNICEF) 300 MG capsule; Take 1 capsule (300 mg total) by mouth 2 (two) times daily for 10 days.  Dispense: 20 capsule; Refill: 0  3. Chronic left ear pain  - Ambulatory referral to ENT   Follow up:  Follow up in 6 months or sooner if needed

## 2021-10-19 NOTE — Progress Notes (Signed)
@Patient  ID: Seth Snyder , male    DOB: 29-Sep-1960, 61 y.o.   MRN: [id number]  Chief Complaint  Patient presents with   Follow-up    Light headed and dizziness, since 2021 comes and goes. Back and leg problems,,chronic issue since 2011.    Referring provider: 2012 I, NP   HPI  Seth Snyder 61 y.o. male  has a past medical history of Anemia, Ascending aortic aneurysm (HCC) (07/10/2021), Atrial fibrillation (HCC), Atypical chest pain (10/01/2020), Dizziness, Hypertension, Labile hypertension (07/10/2021), and Low back pain.  Patient presents today for follow-up visit.  He was previously seen between the past.  He was previously referred to GI for pain and for chronic pain.  He has been participating in physical therapy followed with Ortho.  He is scheduled to see GI in August.  Patient complains today of left ear pain.  He states that he has had issues with chronic ear infections.  Upon exam his left TM is erythematous.  Patient brings a form with him today stating that he needs cancer screening.  We will place referral for further evaluation of this.  Denies f/c/s, n/v/d, hemoptysis, PND, leg swelling Denies chest pain or edema       Allergies  Allergen Reactions   Entresto [Sacubitril-Valsartan]     Abnormal kidney functions     Immunization History  Administered Date(s) Administered   Influenza Split 04/12/2013, 12/13/2013, 04/25/2015   Influenza,inj,Quad PF,6+ Mos 04/06/2016, 05/30/2017, 02/10/2018, 02/14/2019, 03/31/2020   PFIZER(Purple Top)SARS-COV-2 Vaccination 06/18/2019, 07/10/2019, 04/01/2020   Pneumococcal Polysaccharide-23 04/25/2015   Tdap 04/25/2015   Zoster Recombinat (Shingrix) 02/10/2018, 03/12/2019    Past Medical History:  Diagnosis Date   Anemia    Ascending aortic aneurysm (HCC) 07/10/2021   Atrial fibrillation (HCC)    Atypical chest pain 10/01/2020   Dizziness    Hypertension    Labile hypertension 07/10/2021   Low back pain      Tobacco History: Social History   Tobacco Use  Smoking Status Former   Types: Cigarettes   Quit date: 12/21/2018   Years since quitting: 2.8  Smokeless Tobacco Never   Counseling given: Not Answered   Outpatient Encounter Medications as of 10/19/2021  Medication Sig   acetaminophen (TYLENOL) 500 MG tablet Take 500 mg by mouth as needed.   albuterol (VENTOLIN HFA) 108 (90 Base) MCG/ACT inhaler Inhale 2 puffs into the lungs every 6 (six) hours as needed for wheezing or shortness of breath.   amiodarone (PACERONE) 200 MG tablet Take 1 tablet (200 mg total) by mouth daily.   amLODipine (NORVASC) 2.5 MG tablet Take 2.5 mg by mouth daily.   ammonium lactate (AMLACTIN) 12 % cream Apply topically.   ANORO ELLIPTA 62.5-25 MCG/INH AEPB Inhale 1 puff into the lungs 2 (two) times daily as needed (sob/wheezing).   apixaban (ELIQUIS) 5 MG TABS tablet Take 1 tablet (5 mg total) by mouth 2 (two) times daily.   atorvastatin (LIPITOR) 40 MG tablet Take 1 tablet (40 mg total) by mouth daily at 6 PM.   carvedilol (COREG) 25 MG tablet Take 1 tablet (25 mg total) by mouth 2 (two) times daily.   cefdinir (OMNICEF) 300 MG capsule Take 1 capsule (300 mg total) by mouth 2 (two) times daily for 10 days.   citalopram (CELEXA) 40 MG tablet Take 1 tablet (40 mg total) by mouth daily.   DULoxetine (CYMBALTA) 60 MG capsule Take 1 capsule (60 mg total) by mouth daily. Please call and make overdue  appt for further refills, 1st attempt   Elastic Bandages & Supports (ADJUSTABLE ARM SLING) MISC 1 application. by Does not apply route daily.   Elastic Bandages & Supports (FITRITE LUMBAR BACK BRACE) MISC 1 each by Does not apply route daily as needed (low back pain).   esomeprazole (NEXIUM) 40 MG capsule Take 1 capsule (40 mg total) by mouth daily.   fluticasone-salmeterol (ADVAIR HFA) 115-21 MCG/ACT inhaler Inhale 2 puffs into the lungs 2 (two) times daily.   fluticasone-salmeterol (ADVAIR HFA) 115-21 MCG/ACT inhaler  SMARTSIG:2 Puff(s) Via Inhaler Twice Daily   gabapentin (NEURONTIN) 300 MG capsule Take 3 capsules (900 mg total) by mouth at bedtime. For nerve pain   Heating Pads ([employer] HEATING PAD) PADS 1 application by Does not apply route 3 (three) times daily. 15 minute intervals only   hydrALAZINE (APRESOLINE) 25 MG tablet Take 1 tablet (25 mg total) by mouth 2 (two) times daily. 12 HOURS APART   hydrOXYzine (ATARAX) 10 MG tablet Take 1 tablet (10 mg total) by mouth 3 (three) times daily as needed. For itch   methocarbamol (ROBAXIN) 500 MG tablet Take 500 mg by mouth as needed.   Oxcarbazepine (TRILEPTAL) 300 MG tablet Take 1 tablet (300 mg total) by mouth at bedtime. X 1 week, then 600 mg nightly x 1 week, then 900 mg nightly- for nerve pain   traZODone (DESYREL) 50 MG tablet Take half tablet at bedtime as needed for sleep.   triamcinolone ointment (KENALOG) 0.5 % APPLY TO AFFECTED AREA TWICE A DAY   [DISCONTINUED] levothyroxine (SYNTHROID) 75 MCG tablet Take 1 tablet (75 mcg total) by mouth daily.   levothyroxine (SYNTHROID) 75 MCG tablet Take 1 tablet (75 mcg total) by mouth daily.   No facility-administered encounter medications on file as of 10/19/2021.     Review of Systems  Review of Systems  Constitutional: Negative.   HENT: Negative.    Cardiovascular: Negative.   Gastrointestinal: Negative.   Musculoskeletal:  Positive for back pain.  Allergic/Immunologic: Negative.   Neurological: Negative.   Psychiatric/Behavioral: Negative.         Physical Exam  BP (!) 154/92 (BP Location: Left Arm, Patient Position: Sitting, Cuff Size: Normal)   Pulse 84   Temp 98.4 F (36.9 C)   Resp 17   Wt 159 lb 3.2 oz (72.2 kg)   SpO2 100%   BMI 24.93 kg/m   Wt Readings from Last 5 Encounters:  10/19/21 159 lb 3.2 oz (72.2 kg)  10/02/21 158 lb 9.6 oz (71.9 kg)  09/29/21 159 lb (72.1 kg)  09/15/21 159 lb 3.2 oz (72.2 kg)  09/09/21 159 lb (72.1 kg)     Physical Exam Vitals and nursing note  reviewed.  Constitutional:      General: He is not in acute distress.    Appearance: He is well-developed.  HENT:     Left Ear: Decreased hearing noted. Tenderness present. Tympanic membrane is injected and erythematous.  Cardiovascular:     Rate and Rhythm: Normal rate and regular rhythm.  Pulmonary:     Effort: Pulmonary effort is normal.     Breath sounds: Normal breath sounds.  Skin:    General: Skin is warm and dry.  Neurological:     Mental Status: He is alert and oriented to person, place, and time.      Lab Results:  CBC    Component Value Date/Time   WBC 5.8 08/25/2021 0847   WBC 9.5 07/14/2021 1526   RBC 4.17  08/25/2021 0847   RBC 3.66 (L) 07/14/2021 1526   HGB 12.2 (L) 08/25/2021 0847   HCT 36.8 (L) 08/25/2021 0847   PLT 255 08/25/2021 0847   MCV 88 08/25/2021 0847   MCH 29.3 08/25/2021 0847   MCH 30.1 07/14/2021 1526   MCHC 33.2 08/25/2021 0847   MCHC 32.5 07/14/2021 1526   RDW 12.7 08/25/2021 0847   LYMPHSABS 1.5 08/25/2021 0847   MONOABS 0.7 06/12/2021 1116   EOSABS 0.2 08/25/2021 0847   BASOSABS 0.0 08/25/2021 0847    BMET    Component Value Date/Time   NA 135 08/25/2021 0847   K 4.2 08/25/2021 0847   CL 99 08/25/2021 0847   CO2 18 (L) 08/25/2021 0847   GLUCOSE 124 (H) 08/25/2021 0847   GLUCOSE 105 (H) 07/14/2021 1526   BUN 8 08/25/2021 0847   CREATININE 1.18 08/25/2021 0847   CALCIUM 9.7 08/25/2021 0847   GFRNONAA >60 07/14/2021 1526   GFRAA 55 (L) 01/11/2020 1009    BNP    Component Value Date/Time   BNP 46.9 06/12/2021 1116    ProBNP No results found for: "PROBNP"  Imaging: No results found.   Assessment & Plan:   Former smoker - Ambulatory Referral Lung Cancer Screening [facility]  2. Left otitis media, unspecified otitis media type  - cefdinir (OMNICEF) 300 MG capsule; Take 1 capsule (300 mg total) by mouth 2 (two) times daily for 10 days.  Dispense: 20 capsule; Refill: 0  3. Chronic left ear pain  -  Ambulatory referral to ENT   Follow up:  Follow up in 6 months or sooner if needed     Fenton Foy, NP 10/19/2021

## 2021-10-22 DIAGNOSIS — H524 Presbyopia: Secondary | ICD-10-CM | POA: Diagnosis not present

## 2021-10-22 DIAGNOSIS — H353110 Nonexudative age-related macular degeneration, right eye, stage unspecified: Secondary | ICD-10-CM | POA: Diagnosis not present

## 2021-10-28 ENCOUNTER — Other Ambulatory Visit: Payer: Self-pay | Admitting: Acute Care

## 2021-10-29 ENCOUNTER — Other Ambulatory Visit: Payer: Self-pay

## 2021-10-29 DIAGNOSIS — Z122 Encounter for screening for malignant neoplasm of respiratory organs: Secondary | ICD-10-CM

## 2021-10-29 DIAGNOSIS — Z87891 Personal history of nicotine dependence: Secondary | ICD-10-CM

## 2021-11-04 ENCOUNTER — Ambulatory Visit: Payer: Self-pay | Admitting: Nurse Practitioner

## 2021-11-09 ENCOUNTER — Ambulatory Visit (INDEPENDENT_AMBULATORY_CARE_PROVIDER_SITE_OTHER): Payer: Medicare Other | Admitting: Cardiovascular Disease

## 2021-11-09 ENCOUNTER — Encounter (HOSPITAL_BASED_OUTPATIENT_CLINIC_OR_DEPARTMENT_OTHER): Payer: Self-pay | Admitting: Cardiovascular Disease

## 2021-11-09 DIAGNOSIS — R32 Unspecified urinary incontinence: Secondary | ICD-10-CM | POA: Diagnosis not present

## 2021-11-09 DIAGNOSIS — M5416 Radiculopathy, lumbar region: Secondary | ICD-10-CM | POA: Diagnosis not present

## 2021-11-09 DIAGNOSIS — I5042 Chronic combined systolic (congestive) and diastolic (congestive) heart failure: Secondary | ICD-10-CM | POA: Diagnosis not present

## 2021-11-09 DIAGNOSIS — I4819 Other persistent atrial fibrillation: Secondary | ICD-10-CM

## 2021-11-09 DIAGNOSIS — R0989 Other specified symptoms and signs involving the circulatory and respiratory systems: Secondary | ICD-10-CM

## 2021-11-09 NOTE — Assessment & Plan Note (Signed)
He has pain in his legs.  He has known cervical and lumbar radiculopathy.  Surgery has been recommended.  We discussed possible statin myalgias, though the symptoms are new in the last week, making this less likely.

## 2021-11-09 NOTE — Assessment & Plan Note (Signed)
BP remains labile.  Overall it has improved.  It was stable in the office today but he still has episodes of dizziness and lightheadedness.  Recommend trying a different antidepressant given that this may be causing hypotension.  Otherwise, continue amlodipine, carvedilol, and hydralazine.

## 2021-11-09 NOTE — Assessment & Plan Note (Addendum)
Currently in sinus rhythm on amiodarone, carvedilol and Eliquis.  He is not a candidate for dofetilide due to long QTc.
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
## 2021-11-09 NOTE — Patient Instructions (Signed)
Medication Instructions:  Your physician recommends that you continue on your current medications as directed. Please refer to the Current Medication list given to you today.   *If you need a refill on your cardiac medications before your next appointment, please call your pharmacy*  Lab Work: NONE  Testing/Procedures: NONE  Follow-Up: At [facility], you and your health needs are our priority.  As part of our continuing mission to provide you with exceptional heart care, we have created designated Provider Care Teams.  These Care Teams include your primary Cardiologist (physician) and Advanced Practice Providers (APPs -  Physician Assistants and Nurse Practitioners) who all work together to provide you with the care you need, when you need it.  We recommend signing up for the patient portal called "MyChart".  Sign up information is provided on this After Visit Summary.  MyChart is used to connect with patients for Virtual Visits (Telemedicine).  Patients are able to view lab/test results, encounter notes, upcoming appointments, etc.  Non-urgent messages can be sent to your provider as well.   To learn more about what you can do with MyChart, go to [email or web address].    Your next appointment:   4 month(s)  The format for your next appointment:   In Person  Provider:   Chilton Si, MD   Other Instructions WILL HAVE [employer] SOCIAL WORKER REACH OUT TO YOU

## 2021-11-09 NOTE — Assessment & Plan Note (Signed)
LVEF recovered.  BP now better controlled and more stable.  Continue amlodipine,carvedilol, and hydralazine.

## 2021-11-09 NOTE — Assessment & Plan Note (Signed)
Patient was getting incontinence supplies through his insurance but is no longer able to get them.  Will reach out to PCP and social work.

## 2021-11-10 ENCOUNTER — Telehealth: Payer: Self-pay

## 2021-11-10 ENCOUNTER — Encounter: Payer: Self-pay | Admitting: Gastroenterology

## 2021-11-10 ENCOUNTER — Ambulatory Visit (INDEPENDENT_AMBULATORY_CARE_PROVIDER_SITE_OTHER): Payer: Medicare Other | Admitting: Gastroenterology

## 2021-11-10 ENCOUNTER — Other Ambulatory Visit (INDEPENDENT_AMBULATORY_CARE_PROVIDER_SITE_OTHER): Payer: Medicare Other

## 2021-11-10 ENCOUNTER — Ambulatory Visit: Payer: Medicare Other | Admitting: Gastroenterology

## 2021-11-10 VITALS — BP 150/82 | HR 79 | Ht 67.0 in | Wt 159.0 lb

## 2021-11-10 DIAGNOSIS — R197 Diarrhea, unspecified: Secondary | ICD-10-CM | POA: Diagnosis not present

## 2021-11-10 DIAGNOSIS — R159 Full incontinence of feces: Secondary | ICD-10-CM

## 2021-11-10 DIAGNOSIS — R109 Unspecified abdominal pain: Secondary | ICD-10-CM

## 2021-11-10 LAB — SEDIMENTATION RATE: Sed Rate: 30 mm/hr — ABNORMAL HIGH (ref 0–20)

## 2021-11-10 MED ORDER — NA SULFATE-K SULFATE-MG SULF 17.5-3.13-1.6 GM/177ML PO SOLN: 1.0000 | Freq: Once | ORAL | 0 refills | Status: AC

## 2021-11-10 MED ORDER — LOPERAMIDE HCL 2 MG PO TABS: 2.0000 mg | ORAL_TABLET | Freq: Four times a day (QID) | ORAL | 1 refills | Status: AC | PRN

## 2021-11-10 NOTE — Progress Notes (Signed)
HPI : Seth Snyder is a very pleasant 61 year old [place] male with a history of atrial fibrillation, combined systolic and diastolic heart failure, and labile hypertension who is referred to [place] by Bo Merino for further evaluation of chronic diarrhea and abdominal pain.  Patient states that he has had the symptoms for about a year.  The symptoms have been stable over that time, not progressive.  He reports having on average 10-12 bowel movements per day.  Stools are watery in consistency.  He has frequent urgency and incontinence.  Estimates having an accident about 4 times a day, requiring him to wear diapers.  He has nocturnal bowel movements every night.  He very rarely has any formed stool.  His diarrhea does not seem to vary with what he eats.  He has had many medication changes over the past year, but none seem to affect his diarrhea for better or for worse. He reports constant burning abdominal pain, poorly localized.  He also complains of abdominal distention for the past year.  He has decreased appetite, but no nausea or vomiting.  His weight has fluctuated up and down, but no consistent unintentional weight loss.  He also reports difficulty swallowing, characterized with sensation of food sticking in his chest.  He denies problems with heartburn or acid regurgitation. He denies any history of abdominal surgeries such as cholecystectomy or bowel resection.  He denies any changes to his diet that accompanied the change in bowel habits. He states that he has been taking Nexium for about 2 years.  He does not exactly recall why he has been on this medication. The patient reports feeling very tired and weak most of the time.  He has been noted to have very labile blood pressure, with systolic sometimes in the 70s and as high as the 170s. Recent labs show a mild normocytic anemia, with otherwise normal liver enzymes and electrolytes.  Past Medical History:  Diagnosis Date   Anemia    Ascending  aortic aneurysm ([place]) 07/10/2021   Atrial fibrillation ([place])    Atypical chest pain 10/01/2020   Dizziness    Hypertension    Labile hypertension 07/10/2021   Low back pain      Past Surgical History:  Procedure Laterality Date   CARDIOVERSION N/A 05/10/2019   Procedure: CARDIOVERSION;  Surgeon: Lelon Perla, MD;  Location: [facility];  Service: Cardiovascular;  Laterality: N/A;   COLONOSCOPY  07/12/2013   LEFT HEART CATH AND CORONARY ANGIOGRAPHY N/A 08/30/2019   Procedure: LEFT HEART CATH AND CORONARY ANGIOGRAPHY;  Surgeon: Jettie Booze, MD;  Location: [facility];  Service: Cardiovascular;  Laterality: N/A;   TEE WITHOUT CARDIOVERSION N/A 05/10/2019   Procedure: TRANSESOPHAGEAL ECHOCARDIOGRAM (TEE);  Surgeon: Lelon Perla, MD;  Location: [facility] END[facility];  Service: Cardiovascular;  Laterality: N/A;   Family History  Problem Relation Age of Onset   Diabetes Mother    Other Father        unsure of medical history   Diabetes Sister    Mental illness Sister    Mental illness Brother    Colon cancer Neg Hx    Esophageal cancer Neg Hx    Rectal cancer Neg Hx    Liver cancer Neg Hx    Stomach cancer Neg Hx    Social History   Tobacco Use   Smoking status: Former    Types: Cigarettes    Quit date: 12/21/2018    Years since quitting: 2.8   Smokeless  tobacco: Never  Vaping Use   Vaping Use: Never used  Substance Use Topics   Alcohol use: Yes    Comment: occ beers   Drug use: Never   Current Outpatient Medications  Medication Sig Dispense Refill   acetaminophen (TYLENOL) 500 MG tablet Take 500 mg by mouth as needed.     albuterol (VENTOLIN HFA) 108 (90 Base) MCG/ACT inhaler Inhale 2 puffs into the lungs every 6 (six) hours as needed for wheezing or shortness of breath. 8 g 6   amiodarone (PACERONE) 200 MG tablet Take 1 tablet (200 mg total) by mouth daily. 90 tablet 3   amLODipine (NORVASC) 2.5 MG tablet Take 2.5 mg by mouth daily.     ammonium lactate  (AMLACTIN) 12 % cream Apply topically.     ANORO ELLIPTA 62.5-25 MCG/INH AEPB Inhale 1 puff into the lungs 2 (two) times daily as needed (sob/wheezing).     apixaban (ELIQUIS) 5 MG TABS tablet Take 1 tablet (5 mg total) by mouth 2 (two) times daily. 180 tablet 1   atorvastatin (LIPITOR) 40 MG tablet Take 1 tablet (40 mg total) by mouth daily at 6 PM. 90 tablet 3   carvedilol (COREG) 25 MG tablet Take 1 tablet (25 mg total) by mouth 2 (two) times daily. 180 tablet 3   citalopram (CELEXA) 40 MG tablet Take 1 tablet (40 mg total) by mouth daily. 90 tablet 1   DULoxetine (CYMBALTA) 60 MG capsule Take 1 capsule (60 mg total) by mouth daily. Please call and make overdue appt for further refills, 1st attempt 30 capsule 0   Elastic Bandages & Supports (ADJUSTABLE ARM SLING) MISC 1 application. by Does not apply route daily. 1 each 0   Elastic Bandages & Supports (FITRITE LUMBAR BACK BRACE) MISC 1 each by Does not apply route daily as needed (low back pain). 1 each 2   esomeprazole (NEXIUM) 40 MG capsule Take 1 capsule (40 mg total) by mouth daily. 30 capsule 3   fluticasone-salmeterol (ADVAIR HFA) 115-21 MCG/ACT inhaler SMARTSIG:2 Puff(s) Via Inhaler Twice Daily     gabapentin (NEURONTIN) 300 MG capsule Take 3 capsules (900 mg total) by mouth at bedtime. For nerve pain 270 capsule 3   Heating Pads ([employer] HEATING PAD) PADS 1 application by Does not apply route 3 (three) times daily. 15 minute intervals only 1 each 0   hydrALAZINE (APRESOLINE) 25 MG tablet Take 1 tablet (25 mg total) by mouth 2 (two) times daily. 12 HOURS APART 180 tablet 3   hydrOXYzine (ATARAX) 10 MG tablet Take 1 tablet (10 mg total) by mouth 3 (three) times daily as needed. For itch 30 tablet 0   levothyroxine (SYNTHROID) 75 MCG tablet Take 1 tablet (75 mcg total) by mouth daily. 30 tablet 11   methocarbamol (ROBAXIN) 500 MG tablet Take 500 mg by mouth as needed.     Oxcarbazepine (TRILEPTAL) 300 MG tablet Take 1 tablet (300 mg total) by  mouth at bedtime. X 1 week, then 600 mg nightly x 1 week, then 900 mg nightly- for nerve pain 90 tablet 5   traZODone (DESYREL) 50 MG tablet Take half tablet at bedtime as needed for sleep. 90 tablet 1   triamcinolone ointment (KENALOG) 0.5 % APPLY TO AFFECTED AREA TWICE A DAY 30 g 1   No current facility-administered medications for this visit.   Allergies  Allergen Reactions   Entresto [Sacubitril-Valsartan]     Abnormal kidney functions      Review of Systems: All  systems reviewed and negative except where noted in HPI.    No results found.  Physical Exam: BP (!) 150/82   Pulse 79   Ht 5' 7"  (1.702 m)   Wt 159 lb (72.1 kg)   SpO2 98%   BMI 24.90 kg/m  Constitutional: Pleasant, frail, chronically ill-appearing [place]ali male in no acute distress.  Translation provided by [place] CMA Renee Rival HEENT: Normocephalic and atraumatic. Conjunctivae are normal. No scleral icterus. Neck supple.  Cardiovascular: Normal rate, regular rhythm.  Pulmonary/chest: Effort normal and breath sounds normal. No wheezing, rales or rhonchi. Abdominal: Soft, distended, mild multifocal tenderness to palpation. Bowel sounds active throughout, no high-pitched sounds or tinkling bowel sounds. There are no masses palpable. No hepatomegaly. Extremities: no edema Lymphadenopathy: No cervical adenopathy noted. Neurological: Alert and oriented to person place and time. Skin: Skin is warm and dry. No rashes noted. Psychiatric: Normal mood and affect. Behavior is normal.  CBC    Component Value Date/Time   WBC 5.8 08/25/2021 0847   WBC 9.5 07/14/2021 1526   RBC 4.17 08/25/2021 0847   RBC 3.66 (L) 07/14/2021 1526   HGB 12.2 (L) 08/25/2021 0847   HCT 36.8 (L) 08/25/2021 0847   PLT 255 08/25/2021 0847   MCV 88 08/25/2021 0847   MCH 29.3 08/25/2021 0847   MCH 30.1 07/14/2021 1526   MCHC 33.2 08/25/2021 0847   MCHC 32.5 07/14/2021 1526   RDW 12.7 08/25/2021 0847   LYMPHSABS 1.5 08/25/2021 0847    MONOABS 0.7 06/12/2021 1116   EOSABS 0.2 08/25/2021 0847   BASOSABS 0.0 08/25/2021 0847    CMP     Component Value Date/Time   NA 135 08/25/2021 0847   K 4.2 08/25/2021 0847   CL 99 08/25/2021 0847   CO2 18 (L) 08/25/2021 0847   GLUCOSE 124 (H) 08/25/2021 0847   GLUCOSE 105 (H) 07/14/2021 1526   BUN 8 08/25/2021 0847   CREATININE 1.18 08/25/2021 0847   CALCIUM 9.7 08/25/2021 0847   PROT 7.5 08/25/2021 0847   ALBUMIN 5.1 (H) 08/25/2021 0847   AST 22 08/25/2021 0847   ALT 11 08/25/2021 0847   ALKPHOS 84 08/25/2021 0847   BILITOT 0.6 08/25/2021 0847   GFRNONAA >60 07/14/2021 1526   GFRAA 55 (L) 01/11/2020 1009     ASSESSMENT AND PLAN: 61 year old male with 1 year history of persistent chronic watery diarrhea with fecal urgency and incontinence.  He also has burning abdominal pain and abdominal bloating.  No bloody stools and no unintentional weight loss.  He does feel chronically fatigued and has poor functional status, which may be in part related to his diarrhea.  Etiologies for his diarrhea remains unclear and undifferentiated at this point.  We will initiate broad work-up.  Will obtain GI PCR pathogen profile to exclude parasitic and protozoal causes.  Inflammatory markers (ESR, CRP and fecal calprotectin) to assess for inflammatory diarrhea.  TTG/IgA to exclude celiac disease.  Fecal elastase to evaluate for exocrine pancreas insufficiency.  We will obtain B12, iron and vitamin D levels and replete as needed.  Given his burning epigastric pain, will test for H. pylori with stool antigen. Given these new onset symptoms and a 61 year old with no history of endoscopic, we will plan for EGD and colonoscopy.  Because of the patient's history of labile blood pressure and frail status, I recommend he his procedures be performed in the hospital setting. In the meantime, I suggest the patient try taking some Imodium 3 times a day and  at night to reduce his stool frequency and incontinence.   Also suggest that the start taking some psyllium to help improve his stool bulk and consistency.  Diarrhea, fecal incontinence -Fecal calprotectin, ESR, CRP - Fecal elastase - TTG/IgA - GI PCR pathogen profile - B12, vitamin D, iron panel - Loperamide 1 mg nightly - Psyllium daily - Colonoscopy (in hospital)  Abdominal pain -EGD (in hospital) - H. pylori stool antigen  The details, risks (including bleeding, perforation, infection, missed lesions, medication reactions and possible hospitalization or surgery if complications occur), benefits, and alternatives to EGD/colonoscopy with possible biopsy and possible polypectomy were discussed with the patient and he consents to proceed.   Kischa Altice E. Candis Schatz, MD [facility]   CC: Teena Dunk, NP

## 2021-11-10 NOTE — Patient Instructions (Signed)
_______________________________________________________  If you are age 59 or older, your body mass index should be between 23-30. Your Body mass index is 24.9 kg/m. If this is out of the aforementioned range listed, please consider follow up with your Primary Care Provider.  If you are age 71 or younger, your body mass index should be between 19-25. Your Body mass index is 24.9 kg/m. If this is out of the aformentioned range listed, please consider follow up with your Primary Care Provider.   You have been scheduled for a colonoscopy. Please follow written instructions given to you at your visit today.  Please pick up your prep supplies at the pharmacy within the next 1-3 days. If you use inhalers (even only as needed), please bring them with you on the day of your procedure.  Your provider has requested that you go to the basement level for lab work before leaving today. Press "B" on the elevator. The lab is located at the first door on the left as you exit the elevator.  The [facility] providers would like to encourage you to use [facility] to communicate with providers for non-urgent requests or questions.  Due to long hold times on the telephone, sending your provider a message by [facility] may be a faster and more efficient way to get a response.  Please allow 48 business hours for a response.  Please remember that this is for non-urgent requests.   It was a pleasure to see you today!  Thank you for trusting me with your gastrointestinal care!

## 2021-11-10 NOTE — Telephone Encounter (Signed)
[facility] Pre-operative Risk Assessment     Request for surgical clearance:     Endoscopy Procedure  What type of surgery is being performed?     Colonoscopy and Endoscopy   When is this surgery scheduled?     12/17/21  What type of clearance is required ?   Pharmacy  Are there any medications that need to be held prior to surgery and how long? Eliquis 2 days   Practice name and name of physician performing surgery?      [facility]  What is your office phone and fax number?      Phone- [phone]  [phone]  Anesthesia type (None, local, MAC, general) ?       MAC

## 2021-11-11 LAB — TISSUE TRANSGLUTAMINASE, IGA: (tTG) Ab, IgA: <1 U/mL

## 2021-11-11 LAB — IBC + FERRITIN
Ferritin: 72 ng/mL (ref 22.0–322.0)
Iron: 146 ug/dL (ref 42–165)
Saturation Ratios: 40 % (ref 20.0–50.0)
TIBC: 365.4 ug/dL (ref 250.0–450.0)
Transferrin: 261 mg/dL (ref 212.0–360.0)

## 2021-11-11 LAB — FOLATE: Folate: 7.1 ng/mL (ref >5.9)

## 2021-11-11 LAB — C-REACTIVE PROTEIN: CRP: <1 mg/dL (ref 0.5–20.0)

## 2021-11-11 LAB — IGA: Immunoglobulin A: 522 mg/dL — ABNORMAL HIGH (ref 47–310)

## 2021-11-11 LAB — VITAMIN B12: Vitamin B-12: 125 pg/mL — ABNORMAL LOW (ref 211–911)

## 2021-11-11 NOTE — Telephone Encounter (Signed)
     Primary Cardiologist: Chilton Si, MD  Chart reviewed as part of pre-operative protocol coverage. Given past medical history and time since last visit, based on ACC/AHA guidelines, Seth Snyder would be at acceptable risk for the planned procedure without further cardiovascular testing.   Patient with diagnosis of afib on Eliquis for anticoagulation.     Procedure: colonoscopy and endoscopy Date of procedure: 12/17/21   CHA2DS2-VASc Score = 2  This indicates a 2.2% annual risk of stroke. The patient's score is based upon: CHF History: 1 HTN History: 1 Diabetes History: 0 Stroke History: 0 Vascular Disease History: 0 Age Score: 0 Gender Score: 0   CrCl 60mL/min Platelet count 255K   Per office protocol, patient can hold Eliquis for 2 days prior to procedure as requested.  I will route this recommendation to the requesting party via Epic fax function and remove from pre-op pool.  Please call with questions.  [name]. [name] NP-C     11/11/2021, 10:02 AM [facility] [address] Office [phone] Fax [phone]

## 2021-11-11 NOTE — Telephone Encounter (Signed)
Patient with diagnosis of afib on Eliquis for anticoagulation.    Procedure: colonoscopy and endoscopy Date of procedure: 12/17/21  CHA2DS2-VASc Score = 2  This indicates a 2.2% annual risk of stroke. The patient's score is based upon: CHF History: 1 HTN History: 1 Diabetes History: 0 Stroke History: 0 Vascular Disease History: 0 Age Score: 0 Gender Score: 0   CrCl 34mL/min Platelet count 255K  Per office protocol, patient can hold Eliquis for 2 days prior to procedure as requested.    **This guidance is not considered finalized until pre-operative APP has relayed final recommendations.**

## 2021-11-12 ENCOUNTER — Institutional Professional Consult (permissible substitution): Payer: Medicare Other | Admitting: Pulmonary Disease

## 2021-11-12 ENCOUNTER — Other Ambulatory Visit: Payer: Self-pay

## 2021-11-12 ENCOUNTER — Telehealth: Payer: Self-pay

## 2021-11-12 ENCOUNTER — Other Ambulatory Visit: Payer: Medicare Other

## 2021-11-12 DIAGNOSIS — R159 Full incontinence of feces: Secondary | ICD-10-CM

## 2021-11-12 DIAGNOSIS — R197 Diarrhea, unspecified: Secondary | ICD-10-CM

## 2021-11-12 DIAGNOSIS — R109 Unspecified abdominal pain: Secondary | ICD-10-CM

## 2021-11-12 MED ORDER — CYANOCOBALAMIN 1000 MCG/ML IJ SOLN: INTRAMUSCULAR | 0 refills | Status: AC

## 2021-11-12 MED ORDER — CYANOCOBALAMIN 1000 MCG/ML IJ SOLN: INTRAMUSCULAR | 11 refills | Status: AC

## 2021-11-12 NOTE — Telephone Encounter (Signed)
Patient made aware.

## 2021-11-12 NOTE — Telephone Encounter (Signed)
Patient is Seth Snyder - Mick Sell is going to call patient and tell him he can hold his Eliquis for 2 days

## 2021-11-12 NOTE — Progress Notes (Signed)
Mr. Koranda,  Your labs were overall normal except for a low B12 level.  Low B12 levels can be associated with symptoms of fatigue, irritability as well as neurologic symptoms if severe.  I recommend that receive B12 supplements to get your levels back to within normal levels.  This is best done with B12 injections. Will do weekly for a month, and then monthly.  Will recheck B12 levels in 3 months.  We can perform these injections for you in our clinic.  Alternatively your primary physician's office can perform them, or you can inject yourself at home after receiving instruction.  Bonita Quin,  Can you please put in orders for cyanocobalamin injections IM, weekly x 4, then monthly and see how he would like to receive the injections?

## 2021-11-12 NOTE — Telephone Encounter (Signed)
Informed the patient to hold Eliquis for 2 days prior to procedure. Patient voiced understanding.

## 2021-11-13 LAB — GI PROFILE, STOOL, PCR

## 2021-11-14 LAB — GI PROFILE, STOOL, PCR

## 2021-11-14 LAB — CALPROTECTIN, FECAL

## 2021-11-16 ENCOUNTER — Telehealth: Payer: Self-pay | Admitting: Gastroenterology

## 2021-11-16 NOTE — Telephone Encounter (Signed)
Inbound call from patient requesting a call back. Regarding his labs. Please advise.

## 2021-11-19 ENCOUNTER — Other Ambulatory Visit: Payer: Self-pay | Admitting: Gastroenterology

## 2021-11-19 LAB — CALPROTECTIN, FECAL: Calprotectin, Fecal: <5 ug/g (ref 0–120)

## 2021-11-19 LAB — GI PROFILE, STOOL, PCR

## 2021-11-19 LAB — H. PYLORI ANTIGEN, STOOL: H pylori Ag, Stl: POSITIVE — AB

## 2021-11-20 LAB — PANCREATIC ELASTASE, FECAL: Pancreatic Elastase-1, Stool: 457 mcg/g

## 2021-11-24 ENCOUNTER — Encounter: Payer: Medicare Other | Admitting: Acute Care

## 2021-11-25 NOTE — Progress Notes (Signed)
Seth Snyder,  Your stool test was positive for H. Pylori.  This can be a cause of chronic abdominal pain, but typically does not cause diarrhea.  The other stool tests were normal, with no evidence of infection, inflammation, and normal pancreas function.  We will plan on treating the H. pylori with quad therapy as below. Please confirm no medication allergies to the prescribed regimen.   1) Omeprazole 20 mg 2 times a day x 14 d 2) Pepto Bismol 2 tabs (262 mg each) 4 times a day x 14 d 3) Metronidazole 250 mg 4 times a day x 14 d 4) doxycycline 100 mg 2 times a day x 14 d  After 14 days, ok to stop omeprazole.  4 weeks after treatment completed, check H. Pylori stool antigen to confirm eradication (must be off acid suppression therapy for 2 weeks prior to specimen submission)  Bonita Quin, Can you please contact the patient via interpreter and go over these results and recommendations with him?

## 2021-11-25 NOTE — Telephone Encounter (Signed)
Called pt via interpreter, received voicemail. Will try again.

## 2021-11-26 ENCOUNTER — Ambulatory Visit (HOSPITAL_BASED_OUTPATIENT_CLINIC_OR_DEPARTMENT_OTHER): Payer: Medicare Other

## 2021-11-26 ENCOUNTER — Other Ambulatory Visit: Payer: Self-pay

## 2021-11-26 DIAGNOSIS — R109 Unspecified abdominal pain: Secondary | ICD-10-CM

## 2021-11-26 DIAGNOSIS — A048 Other specified bacterial intestinal infections: Secondary | ICD-10-CM

## 2021-11-26 DIAGNOSIS — R197 Diarrhea, unspecified: Secondary | ICD-10-CM

## 2021-11-26 MED ORDER — BISMUTH 262 MG PO CHEW: 524.0000 mg | CHEWABLE_TABLET | Freq: Four times a day (QID) | ORAL | 0 refills | Status: DC

## 2021-11-26 MED ORDER — DOXYCYCLINE HYCLATE 100 MG PO CAPS: 100.0000 mg | ORAL_CAPSULE | Freq: Two times a day (BID) | ORAL | 0 refills | Status: DC

## 2021-11-26 MED ORDER — OMEPRAZOLE 20 MG PO CPDR: 20.0000 mg | DELAYED_RELEASE_CAPSULE | Freq: Every day | ORAL | 0 refills | Status: DC

## 2021-11-26 MED ORDER — METRONIDAZOLE 250 MG PO TABS: 250.0000 mg | ORAL_TABLET | Freq: Four times a day (QID) | ORAL | 0 refills | Status: AC

## 2021-11-26 NOTE — Telephone Encounter (Signed)
Attempted to call pt again using interpreter and voice mailbox was full unable to leave message. Letter mailed to pt.

## 2021-12-02 ENCOUNTER — Other Ambulatory Visit: Payer: Self-pay

## 2021-12-02 ENCOUNTER — Emergency Department (HOSPITAL_COMMUNITY): Payer: Medicare Other

## 2021-12-02 ENCOUNTER — Emergency Department (HOSPITAL_COMMUNITY)
Admission: EM | Admit: 2021-12-02 | Discharge: 2021-12-02 | Disposition: A | Discharge status: Home / Self Care | Payer: Medicare Other | Attending: Emergency Medicine | Admitting: Emergency Medicine

## 2021-12-02 ENCOUNTER — Encounter (HOSPITAL_COMMUNITY): Payer: Self-pay

## 2021-12-02 DIAGNOSIS — M545 Low back pain, unspecified: Secondary | ICD-10-CM | POA: Diagnosis present

## 2021-12-02 DIAGNOSIS — I10 Essential (primary) hypertension: Secondary | ICD-10-CM | POA: Diagnosis not present

## 2021-12-02 DIAGNOSIS — W19XXXA Unspecified fall, initial encounter: Secondary | ICD-10-CM | POA: Insufficient documentation

## 2021-12-02 DIAGNOSIS — R001 Bradycardia, unspecified: Secondary | ICD-10-CM | POA: Diagnosis not present

## 2021-12-02 DIAGNOSIS — G8929 Other chronic pain: Secondary | ICD-10-CM | POA: Diagnosis not present

## 2021-12-02 DIAGNOSIS — Z7901 Long term (current) use of anticoagulants: Secondary | ICD-10-CM | POA: Diagnosis not present

## 2021-12-02 DIAGNOSIS — M5442 Lumbago with sciatica, left side: Secondary | ICD-10-CM | POA: Insufficient documentation

## 2021-12-02 DIAGNOSIS — M542 Cervicalgia: Secondary | ICD-10-CM | POA: Insufficient documentation

## 2021-12-02 DIAGNOSIS — M5441 Lumbago with sciatica, right side: Secondary | ICD-10-CM | POA: Diagnosis not present

## 2021-12-02 LAB — BASIC METABOLIC PANEL
Anion gap: 6 (ref 5–15)
BUN: 13 mg/dL (ref 6–20)
CO2: 20 mmol/L — ABNORMAL LOW (ref 22–32)
Calcium: 8.7 mg/dL — ABNORMAL LOW (ref 8.9–10.3)
Chloride: 111 mmol/L (ref 98–111)
Creatinine, Ser: 0.93 mg/dL (ref 0.61–1.24)
GFR, Estimated: >60 mL/min (ref >60)
Glucose, Bld: 102 mg/dL — ABNORMAL HIGH (ref 70–99)
Potassium: 4.4 mmol/L (ref 3.5–5.1)
Sodium: 137 mmol/L (ref 135–145)

## 2021-12-02 LAB — CBC
HCT: 35.4 % — ABNORMAL LOW (ref 39.0–52.0)
Hemoglobin: 11.5 g/dL — ABNORMAL LOW (ref 13.0–17.0)
MCH: 30.3 pg (ref 26.0–34.0)
MCHC: 32.5 g/dL (ref 30.0–36.0)
MCV: 93.4 fL (ref 80.0–100.0)
Platelets: 200 10*3/uL (ref 150–400)
RBC: 3.79 MIL/uL — ABNORMAL LOW (ref 4.22–5.81)
RDW: 14.5 % (ref 11.5–15.5)
WBC: 4.9 10*3/uL (ref 4.0–10.5)
nRBC: 0 % (ref 0.0–0.2)

## 2021-12-02 LAB — TROPONIN I (HIGH SENSITIVITY)
Troponin I (High Sensitivity): 7 ng/L (ref <18)
Troponin I (High Sensitivity): 7 ng/L (ref <18)

## 2021-12-02 LAB — CK: Total CK: 99 U/L (ref 49–397)

## 2021-12-02 MED ORDER — METHYLPREDNISOLONE 4 MG PO TBPK: ORAL_TABLET | ORAL | 0 refills | Status: AC

## 2021-12-02 MED ORDER — HYDROMORPHONE HCL 1 MG/ML IJ SOLN
1.0000 mg | Freq: Once | INTRAMUSCULAR | Status: AC
  Administered 2021-12-02: 1 mg via INTRAVENOUS
  Filled 2021-12-02: qty 1

## 2021-12-02 MED ORDER — OXYCODONE-ACETAMINOPHEN 5-325 MG PO TABS
2.0000 | ORAL_TABLET | Freq: Once | ORAL | Status: AC
  Administered 2021-12-02: 2 via ORAL
  Filled 2021-12-02: qty 2

## 2021-12-02 MED ORDER — DEXAMETHASONE SODIUM PHOSPHATE 10 MG/ML IJ SOLN
10.0000 mg | Freq: Once | INTRAMUSCULAR | Status: AC
  Administered 2021-12-02: 10 mg via INTRAVENOUS
  Filled 2021-12-02: qty 1

## 2021-12-02 MED ORDER — OXYCODONE-ACETAMINOPHEN 5-325 MG PO TABS: 1.0000 | ORAL_TABLET | Freq: Four times a day (QID) | ORAL | 0 refills | Status: AC | PRN

## 2021-12-02 MED ORDER — HYDROMORPHONE HCL 1 MG/ML IJ SOLN: 1.0000 mg | Freq: Once | INTRAMUSCULAR | Status: DC

## 2021-12-02 NOTE — ED Triage Notes (Signed)
Pt BIB [facility] from home d/t back pain. Pt has chronic back pain from previous injury. However, the irritation and the pain are worse today. Upon EMS arrival, pt was lying on the floor. Ambulatory on scene. Uses walker and cane to ambulate.  A&O X4. Pt is on gabapetin.

## 2021-12-02 NOTE — ED Provider Notes (Signed)
[facility] EMERGENCY DEPARTMENT Provider Note   CSN: [id number] Arrival date & time: 12/02/21  0626     History  Chief Complaint  Patient presents with   Back Pain    Seth Snyder is a 61 y.o. male.  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  History obtained via [place] interpreter. Patient is a difficult historian.  Patient states that he was in the bathroom yesterday evening, got up from the toilet and felt dizzy, tried to twist to hold himself and felt a twinge in his back.  He states from the pain he fell to the ground.  He denies any head injury or LOC.  Since last night he has been having difficulty ambulating due to the severe pain.  When EMS arrived, he was found laying on the ground but states he did not fall, just felt better with laying on the ground.  He had taken gabapentin for pain with little relief.  He states that he has had issues with dizziness, states that if his blood pressure gets really high and then really low he will get dizzy and sometimes fall.  Per chart review, he is followed by orthopedics for his chronic back pain.  He is also seen by cardiology.  He is on carvedilol 2 times daily 25 mg and hydralazine 25 mg twice daily.  In March of this year he was seen here for another syncopal episode.  He denies any chest pain, shortness of breath, headache surrounding the fall.  He states he has severe pain in his low back and pelvis that hurts even with the slightest movement.  He does note some radiating pain down his right leg.  He states he has had issues with control of his bowels and urine in the past, states that he now is able to control his bowels but still has issues  with urination.  States he sometimes feels like he cannot initiate a urine stream.  He also complains of chronic neck pain and weakness in bilateral upper extremities but this is chronic and unchanged from prior.  He states that he has been told that he needs surgery but he is too afraid to have it. Home Medications Prior to Admission medications   Medication Sig Start Date End Date Taking? Authorizing Provider  methylPREDNISolone (MEDROL DOSEPAK) 4 MG TBPK tablet Take as directed until finished 12/02/21  Yes Sadeel Fiddler, Antony Salmon, PA-C  oxyCODONE-acetaminophen (PERCOCET/ROXICET) 5-325 MG tablet Take 1 tablet by mouth every 6 (six) hours as needed for up to 5 days for severe pain. 12/02/21 12/07/21 Yes Mare Ferrari, PA-C  acetaminophen (TYLENOL) 500 MG tablet Take 500 mg by mouth as needed. 03/12/19   [provider]  albuterol (VENTOLIN HFA) 108 (90 Base) MCG/ACT inhaler Inhale 2 puffs into the lungs every 6 (six) hours as needed for wheezing or shortness of breath. 09/08/20   Bevelyn Ngo, NP  amiodarone (PACERONE) 200 MG tablet Take 1 tablet (200 mg total) by mouth daily. 07/23/21   Alver Sorrow, NP  amLODipine (NORVASC) 2.5 MG tablet Take 2.5 mg by mouth daily. 08/30/21   [provider]  ammonium lactate (AMLACTIN) 12 % cream Apply topically. 03/12/19   [provider]  ANORO ELLIPTA 62.5-25 MCG/INH AEPB Inhale 1 puff into the lungs 2 (two) times daily as needed (sob/wheezing). 11/03/20   [provider]  apixaban (ELIQUIS) 5 MG TABS tablet Take 1 tablet (5 mg total) by mouth 2 (two) times daily. 04/20/21   Shirley Friar, PA-C  atorvastatin (LIPITOR) 40 MG tablet Take 1 tablet (40 mg total) by mouth daily at 6 PM. 04/20/21   Tillery, Satira Mccallum, PA-C  Bismuth 262 MG CHEW Chew 524 mg by mouth in the morning, at noon, in the evening, and at bedtime. 11/26/21   Daryel November, MD  carvedilol (COREG) 25 MG tablet Take 1 tablet (25 mg total) by mouth 2  (two) times daily. 04/20/21   Shirley Friar, PA-C  citalopram (CELEXA) 40 MG tablet Take 1 tablet (40 mg total) by mouth daily. 09/11/20   [place] Crane, MD  cyanocobalamin (VITAMIN B12) 1000 MCG/ML injection Inject 1073mcg IM weekly X 4 doses 11/12/21   Daryel November, MD  cyanocobalamin (VITAMIN B12) 1000 MCG/ML injection Inject 1022mcg IM monthly 11/12/21   Daryel November, MD  doxycycline (VIBRAMYCIN) 100 MG capsule Take 1 capsule (100 mg total) by mouth 2 (two) times daily. 11/26/21   Daryel November, MD  DULoxetine (CYMBALTA) 60 MG capsule Take 1 capsule (60 mg total) by mouth daily. Please call and make overdue appt for further refills, 1st attempt 08/20/21   Marcial Pacas, MD  Elastic Bandages & Supports (ADJUSTABLE ARM SLING) [place] 1 application. by Does not apply route daily. 06/08/21   Vevelyn Francois, NP  Elastic Bandages & Supports (FITRITE LUMBAR BACK BRACE) MISC 1 each by Does not apply route daily as needed (low back pain). 08/04/21   Passmore, Jake Church I, NP  esomeprazole (NEXIUM) 40 MG capsule Take 1 capsule (40 mg total) by mouth daily. 08/24/21   Bo Merino I, NP  fluticasone-salmeterol (ADVAIR HFA) [id number]:[id number] MCG/ACT inhaler SMARTSIG:2 Puff(s) Via Inhaler Twice Daily 06/22/21   [provider]  gabapentin (NEURONTIN) 300 MG capsule Take 3 capsules (900 mg total) by mouth at bedtime. For nerve pain 10/02/21 10/02/22  Lovorn, Jinny Blossom, MD  Heating Pads ([employer] HEATING PAD) PADS 1 application by Does not apply route 3 (three) times daily. 15 minute intervals only 11/12/20   Vevelyn Francois, NP  hydrALAZINE (APRESOLINE) 25 MG tablet Take 1 tablet (25 mg total) by mouth 2 (two) times daily. [place] 07/10/21   Skeet Latch, MD  hydrOXYzine (ATARAX) 10 MG tablet Take 1 tablet (10 mg total) by mouth 3 (three) times daily as needed. For itch 05/20/21   Vevelyn Francois, NP  levothyroxine (SYNTHROID) 75 MCG tablet Take 1 tablet (75 mcg total) by mouth daily. 10/19/21 10/19/22   Fenton Foy, NP  loperamide (IMODIUM A-D) 2 MG tablet Take 1 tablet (2 mg total) by mouth 4 (four) times daily as needed for diarrhea or loose stools. 11/10/21   Daryel November, MD  methocarbamol (ROBAXIN) 500 MG tablet Take 500 mg by mouth as needed. 03/12/19   [provider]  metroNIDAZOLE (FLAGYL) 250 MG tablet Take 1 tablet (250 mg total) by mouth 4 (four) times daily. 11/26/21   Daryel November, MD  omeprazole (PRILOSEC) 20 MG capsule Take 1 capsule (20 mg total) by mouth daily. 11/26/21   Daryel November, MD  Oxcarbazepine (TRILEPTAL) 300 MG tablet Take 1 tablet (300 mg total) by mouth at bedtime. X 1 week, then 600 mg nightly x 1 week, then  900 mg nightly- for nerve pain 06/19/21   Lovorn, Aundra Millet, MD  traZODone (DESYREL) 50 MG tablet Take half tablet at bedtime as needed for sleep. 09/11/20   Zena Amos, MD  triamcinolone ointment (KENALOG) 0.5 % APPLY TO AFFECTED AREA TWICE A DAY 07/01/21   Orion Crook I, NP      Allergies    Entresto [sacubitril-valsartan]    Review of Systems   Review of Systems Ten systems reviewed and are negative for acute change, except as noted in the HPI.   Physical Exam Updated Vital Signs BP (!) 152/90   Pulse 91   Temp 98.2 F (36.8 C)   Resp 18   SpO2 97%  Physical Exam Vitals and nursing note reviewed.  Constitutional:      General: He is not in acute distress.    Appearance: He is well-developed.  HENT:     Head: Normocephalic and atraumatic.     Comments: No of hemotympanum, raccoon eyes, battle sign.  No mastoid tenderness.  No malocclusion.  No evidence of lacerations, cranial deformities. Full range of motion of head and neck    Eyes:     Conjunctiva/sclera: Conjunctivae normal.  Cardiovascular:     Rate and Rhythm: Normal rate and regular rhythm.     Heart sounds: No murmur heard. Pulmonary:     Effort: Pulmonary effort is normal. No respiratory distress.     Breath sounds: Normal breath sounds.   Abdominal:     Palpations: Abdomen is soft.     Tenderness: There is no abdominal tenderness.     Comments: No abdominal tenderness, no bruising  Musculoskeletal:        General: No swelling.     Cervical back: Neck supple.     Comments: No for spine and lower extremity difficult to assess.  Patient is minimally cooperative secondary to pain.  He does have intact strength bilaterally and sensation, however will not move his lower extremities for range of motion secondary to pain.  He has midline tenderness to the L-spine with no step-offs or crepitus.  He does have some SI joint tenderness.  No visible leg shortening.  Mild midline tenderness to the cervical spine with no step-offs or crepitus but equal strength bilaterally.   Skin:    General: Skin is warm and dry.     Capillary Refill: Capillary refill takes less than 2 seconds.  Neurological:     General: No focal deficit present.     Mental Status: He is alert and oriented to person, place, and time.     Sensory: No sensory deficit.     Motor: No weakness.  Psychiatric:        Mood and Affect: Mood normal.     ED Results / Procedures / Treatments   Labs (all labs ordered are listed, but only abnormal results are displayed) Labs Reviewed  CBC - Abnormal; Notable for the following components:      Result Value   RBC 3.79 (*)    Hemoglobin 11.5 (*)    HCT 35.4 (*)    All other components within normal limits  BASIC METABOLIC PANEL - Abnormal; Notable for the following components:   CO2 20 (*)    Glucose, Bld 102 (*)    Calcium 8.7 (*)    All other components within normal limits  CK  URINALYSIS, ROUTINE W REFLEX MICROSCOPIC  TROPONIN I (HIGH SENSITIVITY)  TROPONIN I (HIGH SENSITIVITY)    EKG EKG Interpretation  Date/Time:  Wednesday December 02 2021 09:33:38 EDT Ventricular Rate:  55 PR Interval:  200 QRS Duration: 92 QT Interval:  442 QTC Calculation: 422 R Axis:   76 Text Interpretation: Sinus bradycardia  Otherwise normal ECG When compared with ECG of 15-Jul-2021 13:55, PREVIOUS ECG IS PRESENT Confirmed by Georgina Snell [phone]) on 12/02/2021 9:37:49 AM  Radiology DG Chest 1 View  Result Date: 12/02/2021 CLINICAL DATA:  Worsening back pain EXAM: CHEST  1 VIEW COMPARISON:  06/12/2021 FINDINGS: Heart size is normal. Mediastinal shadows are normal. The lungs are clear. No infiltrate, collapse or effusion. No visible bone abnormality. IMPRESSION: No active disease. Electronically Signed   By: Nelson Chimes M.D.   On: 12/02/2021 14:50   MR PELVIS WO CONTRAST  Result Date: 12/02/2021 CLINICAL DATA:  Fall, back pain. EXAM: MRI PELVIS WITHOUT CONTRAST TECHNIQUE: Multiplanar multisequence MR imaging of the pelvis was performed. No intravenous contrast was administered. COMPARISON:  None Available. FINDINGS: Osseous structures and joints: Mild degenerative spurring of both femoral heads. No significant marrow edema. No fracture or acute bony findings. Upper normal amount of fluid in both hip joints without overt effusion. Pubic symphysis and SI joints unremarkable. Musculotendinous: Unremarkable Other: No impinging lesion along the sacral plexus or sciatic nerves bilaterally. No additional significant findings. IMPRESSION: 1. No acute posttraumatic findings are identified. No specific cause for pelvic pain observed. 2. Mild spurring of both femoral heads. Electronically Signed   By: Van Clines M.D.   On: 12/02/2021 12:33   MR LUMBAR SPINE WO CONTRAST  Result Date: 12/02/2021 CLINICAL DATA:  Low back pain which is chronic. Symptoms worsening recently. EXAM: MRI LUMBAR SPINE WITHOUT CONTRAST TECHNIQUE: Multiplanar, multisequence MR imaging of the lumbar spine was performed. No intravenous contrast was administered. COMPARISON:  08/22/2020 FINDINGS: Segmentation: 5 lumbar type vertebral bodies as numbered previously. Alignment: Mild scoliotic curvature convex to the left with the apex at L2-3. 1-2 mm  retrolisthesis L2-3. Vertebrae: No acute fracture or focal bone lesion. Possible old minor compression deformity at T12. Conus medullaris and cauda equina: Conus extends to the L1 level. Conus and cauda equina appear normal. Paraspinal and other soft tissues: Negative Disc levels: T12-L1 and L1-2: No stenosis. Mild facet hypertrophy on the right at L1-2. L2-3: 1-2 mm retrolisthesis. Disc degeneration with mild annular bulging. Mild facet degeneration and hypertrophy. Mild narrowing of the right lateral recess and intervertebral foramen on the right but no likely neural compression. No change. L3-4: No disc abnormality. Minimal facet degeneration on the right. No stenosis. L4-5: Disc degeneration with a shallow left posterolateral herniation. Stenosis of the left lateral recess that could compress the left L5 nerve. This was present previously but could be slightly larger. Bilateral facet osteoarthritis also present at this level. L5-S1: Minimal bulging of the disc. Minimal facet osteoarthritis. No stenosis. IMPRESSION: L4-5: Left posterolateral disc herniation. Stenosis of the left lateral recess could possibly compress the left L5 nerve. This was present previously but may be minimally larger. Facet osteoarthritis also present at this level. L2-3: 2 mm retrolisthesis. Disc degeneration and bulging. Mild facet osteoarthritis. Mild stenosis of the right lateral recess and intervertebral foramen without visible neural compression. No significant change. Lesser, non-compressive degenerative changes at the other levels as above. Electronically Signed   By: Nelson Chimes M.D.   On: 12/02/2021 11:40    Procedures Procedures    Medications Ordered in ED Medications  dexamethasone (DECADRON) injection 10 mg (10 mg Intravenous Given 12/02/21 0921)  HYDROmorphone (DILAUDID) injection 1 mg (1  mg Intravenous Given 12/02/21 0921)  oxyCODONE-acetaminophen (PERCOCET/ROXICET) 5-325 MG per tablet 2 tablet (2 tablets Oral  Given 12/02/21 1250)    ED Course/ Medical Decision Making/ A&P                           Medical Decision Making Amount and/or Complexity of Data Reviewed Labs: ordered. Radiology: ordered.  Risk Prescription drug management.   61 year old male presenting to the ER with complaints of low back pain after a strain which resulted in a fall.  He has had multiple visits to the ER for presyncopal/syncopal episodes with negative work-ups.  On arrival, he is minimally participatory in the exam secondary to pain.  Had difficulty having him roll to even examine his back.  He was able to press into my hands and has equal strength with plantarflexion and dorsiflexion, and his sensations are intact.  He would not range his lower extremities for me however secondary to pain.  He does have midline tenderness to the lumbar spine with but no step-offs or crepitus.  No visible leg shortening or deformities.  He has some cervical spine tenderness but upper extremity strength is equal and intact.  He does endorse that his upper extremities do tire with holding a phone, daily tasks etc. but this again is not new and has been ongoing for quite some time.  He has known neural foraminal stenosis of the right C6-7 and left C3-4.  He has no focal neurodeficits.  He has no tenderness to the abdomen, chest, no other signs of trauma.  No evidence of head trauma.  Differential diagnosis includes labile hypertension, atrial fibrillation, ACS, dissection, orthostatic hypotension, vasovagal syncope, PE, pneumothorax, slight derangement, arrhythmia   Labs ordered, reviewed and interpreted by me CBC with a hemoglobin of 11.5, stable.  BMP without any significant electrode abnormalities.  CO2 slightly decreased, normal anion gap, possibly secondary to dehydration.  Initial troponin negative.  UA pending.  CK and repeat troponin normal.  EKG with sinus bradycardia with a rate in the 50s however improved upon ER stay.  Blood  pressure stable.  Given how nonparticipatory in the exam the patient was as well as a language barrier, opted to order an MRI of the pelvis and lumbar spine to rule out any fractures and rule out cauda equina.  Reviewed, agree with radiology read, findings as below   MR PELVIS WO CONTRAST IMPRESSION:  1. No acute posttraumatic findings are identified. No specific cause  for pelvic pain observed.  2. Mild spurring of both femoral heads.   MR LUMBAR W/O CONTRAST  IMPRESSION:  L4-5: Left posterolateral disc herniation. Stenosis of the left  lateral recess could possibly compress the left L5 nerve. This was  present previously but may be minimally larger. Facet osteoarthritis  also present at this level.    L2-3: 2 mm retrolisthesis. Disc degeneration and bulging. Mild facet  osteoarthritis. Mild stenosis of the right lateral recess and  intervertebral foramen without visible neural compression. No  significant change.    Lesser, non-compressive degenerative changes at the other levels as  above.    I have low suspicion for acute dissection, ACS, PE at this time.  He has been in the ER multiple times with negative work-ups.  No evidence of cauda equina.  Patient was given Dilaudid, Percocet and Decadron with some improvement in his pain.  He was able to ambulate with his walker which is his baseline around the  ER with with a steady gait.  Patient lives with his son and daughter.  He is comfortable with going home.  I recommended that he follow-up with orthopedics for his pain cardiology for his dizziness, may benefit from further medication reevaluation.  Will send home with a course of Percocet and Medrol Dosepak.  Encouraged encouraged taking his Robaxin at home.  Discussed with Dr. Nechama Guard who is agreeable to the above plan and disposition Final Clinical Impression(s) / ED Diagnoses Final diagnoses:  Chronic bilateral low back pain with bilateral sciatica  Fall, initial encounter     Rx / DC Orders ED Discharge Orders          Ordered    methylPREDNISolone (MEDROL DOSEPAK) 4 MG TBPK tablet        12/02/21 1558    oxyCODONE-acetaminophen (PERCOCET/ROXICET) 5-325 MG tablet  Every 6 hours PRN        12/02/21 1558              Lyndel Safe 12/02/21 1604    Elgie Congo, MD 12/02/21 1939

## 2021-12-02 NOTE — Discharge Instructions (Addendum)
?????? ???? ??? ??????? ??????? ????? ?? ?????? ???????? ???? ???????? ???? ???? ????? ?????? ??????? ???????? ?????? ???????? ????? ????????? ??????? ???? ?????? ?????   Percocet ?????? ?????????? ?? ?????? ??? ???????? Tylenol ?????????? ????? ????? ?????????? ????????? ????? ???? ??????? ?????????? ??????? ????? ?? ???? ?? ??????? ??????????????? ??? ????? ???? ?? ???? ????????? ?????????? ???? ??? ???? ?? ???????? ???? ?????????? ???? ER ?? ??????????? Tap?'?k? ?jak? k?ma samagram? ??vasta thiy?. ?ja tap?'?k? ?ma'?ra'?'?m? kunai parivartana bha'?k? chaina. Kr?pay? sam?pta nabha'?sam'ma nird??ana anus?ra s??r?'i?a py?ka linuh?s. Dukh?'ik? l?gi ?va?yaka r?pam? Percocet pray?ga garnuh?s. Georges Lynch au?adhik? s?tha atirikta Tylenol nalinuh?s. Kr?pay? ?phn? ?rth?p??ika ??k?arasam?ga phal?'apa garna ni?cita garnuh?s. Tap?'im?k? cakkara k? b?r? m? tap?'im?k? k?r?iy?l?jis?a sa?ga phal?'apa garna k? l?g? suni?cita garnuh?s. Philomena Course pani nay?m? v? bigram?dai ga'?k? lak?a?ahar?k? l?gi ER m? pharkanuh?s.    Your work-up today was overall reassuring.  There were no changes to your MRI today.  Please take the steroid pack as directed until finished.  Use the Percocet as needed for pain.  Do not take additional Tylenol with this medication.  Please make sure to follow-up with your orthopedic doctor.  Is also make sure to follow-up with your cardiologist about your dizziness.  Return to the ER for any new or worsening symptoms.

## 2021-12-02 NOTE — ED Notes (Signed)
Pt ambulated to the bathroom w his walker w steady gait.

## 2021-12-10 ENCOUNTER — Encounter (HOSPITAL_COMMUNITY): Payer: Self-pay | Admitting: Gastroenterology

## 2021-12-15 ENCOUNTER — Encounter: Payer: Self-pay | Admitting: Acute Care

## 2021-12-15 ENCOUNTER — Encounter (INDEPENDENT_AMBULATORY_CARE_PROVIDER_SITE_OTHER): Payer: Medicare Other | Admitting: Acute Care

## 2021-12-15 ENCOUNTER — Telehealth: Payer: Self-pay | Admitting: Acute Care

## 2021-12-15 DIAGNOSIS — Z87891 Personal history of nicotine dependence: Secondary | ICD-10-CM

## 2021-12-15 NOTE — Telephone Encounter (Signed)
Pt. Had a scheduled shared decision making tele visit with me at 4 pm today. He had agreed to the visit and had also had a telephone reminder. I called the patient x 2 and there was no answer. I left a HIPPA compliant message on the patient's VM letting him know we will have to reschedule his visit.  Langley Gauss, please reschedule patient as he was a no show for the tele visit. His scan is not until 12/23/2021. Thanks so much

## 2021-12-15 NOTE — Progress Notes (Deleted)
Virtual Visit via Telephone Note  I connected with Seth Snyder on 12/15/21 at  4:00 PM EDT by telephone and verified that I am speaking with the correct person using two identifiers.  Location: Patient:  At home Provider:  [address], [place], [place], [address]    I discussed the limitations, risks, security and privacy concerns of performing an evaluation and management service by telephone and the availability of in person appointments. I also discussed with the patient that there may be a patient responsible charge related to this service. The patient expressed understanding and agreed to proceed.    Shared Decision Making Visit Lung Cancer Screening Program [phone])   Eligibility: Age 61 y.o. Pack Years Smoking History Calculation 22 pack year smoking history (# packs/per year x # years smoked) Recent History of coughing up blood  no Unexplained weight loss? no ( >Than 15 pounds within the last 6 months ) Prior History Lung / other cancer no (Diagnosis within the last 5 years already requiring surveillance chest CT Scans). Smoking Status Former Smoker Former Smokers: Years since quit: 2 years 11 months  Quit Date: 12/21/2018  Visit Components: Discussion included one or more decision making aids. yes Discussion included risk/benefits of screening. yes Discussion included potential follow up diagnostic testing for abnormal scans. yes Discussion included meaning and risk of over diagnosis. yes Discussion included meaning and risk of False Positives. yes Discussion included meaning of total radiation exposure. yes  Counseling Included: Importance of adherence to annual lung cancer LDCT screening. yes Impact of comorbidities on ability to participate in the program. yes Ability and willingness to under diagnostic treatment. yes  Smoking Cessation Counseling: Current Smokers:  Discussed importance of smoking cessation. yes Information about tobacco cessation  classes and interventions provided to patient. yes Patient provided with "ticket" for LDCT Scan. yes Symptomatic Patient. no  Counseling NA Diagnosis Code: Tobacco Use Z72.0 Asymptomatic Patient yes  Counseling (Intermediate counseling: > three minutes counseling) [id number] Former Smokers:  Discussed the importance of maintaining cigarette abstinence. yes Diagnosis Code: Personal History of Nicotine Dependence. Q22.979 Information about tobacco cessation classes and interventions provided to patient. Yes Patient provided with "ticket" for LDCT Scan. yes Written Order for Lung Cancer Screening with LDCT placed in Epic. Yes (CT Chest Lung Cancer Screening Low Dose W/O CM) [id number] Z12.2-Screening of respiratory organs Z87.891-Personal history of nicotine dependence  I spent 25 minutes of face to face time/virtual visit time  with  Mr. Bong discussing the risks and benefits of lung cancer screening. We took the time to pause the power point at intervals to allow for questions to be asked and answered to ensure understanding. We discussed that he had taken the single most powerful action possible to decrease his risk of developing lung cancer when he quit smoking. I counseled him to remain smoke free, and to contact me if he ever had the desire to smoke again so that I can provide resources and tools to help support the effort to remain smoke free. We discussed the time and location of the scan, and that either  Abigail Miyamoto RN, Karlton Lemon, RN or I  or I will call / send a letter with the results within  24-72 hours of receiving them. He has the office contact information in the event he needs to speak with me,  he verbalized understanding of all of the above and had no further questions upon leaving the office.     I explained to the  patient that there has been a high incidence of coronary artery disease noted on these exams. I explained that this is a non-gated exam therefore degree or severity  cannot be determined. This patient is on statin therapy. I have asked the patient to follow-up with their PCP regarding any incidental finding of coronary artery disease and management with diet or medication as they feel is clinically indicated. The patient verbalized understanding of the above and had no further questions.     Magdalen Spatz, NP 12/15/2021

## 2021-12-15 NOTE — Patient Instructions (Signed)
Thank you for participating in the [facility]. It was our pleasure to meet you today. We will call you with the results of your scan within the next few days. Your scan will be assigned a Lung RADS category score by the physicians reading the scans.  This Lung RADS score determines follow up scanning.  See below for description of categories, and follow up screening recommendations. We will be in touch to schedule your follow up screening annually or based on recommendations of our providers. We will fax a copy of your scan results to your Primary Care Physician, or the physician who referred you to the program, to ensure they have the results. Please call the office if you have any questions or concerns regarding your scanning experience or results.  Our office number is [phone]. Please speak with Denise Phelps, RN. , or  Denise Buckner RN, They are  our Lung Cancer Screening RN.'s If They are unavailable when you call, Please leave a message on the voice mail. We will return your call at our earliest convenience.This voice mail is monitored several times a day.  Remember, if your scan is normal, we will scan you annually as long as you continue to meet the criteria for the program. (Age 55-77, Current smoker or smoker who has quit within the last 15 years). If you are a smoker, remember, quitting is the single most powerful action that you can take to decrease your risk of lung cancer and other pulmonary, breathing related problems. We know quitting is hard, and we are here to help.  Please let us know if there is anything we can do to help you meet your goal of quitting. If you are a former smoker, congratulations. We are proud of you! Remain smoke free! Remember you can refer friends or family members through the number above.  We will screen them to make sure they meet criteria for the program. Thank you for helping us take better care of you by  participating in Lung Screening.  You can receive free nicotine replacement therapy ( patches, gum or mints) by calling 1-800-QUIT NOW. Please call so we can get you on the path to becoming  a non-smoker. I know it is hard, but you can do this!  Lung RADS Categories:  Lung RADS 1: no nodules or definitely non-concerning nodules.  Recommendation is for a repeat annual scan in 12 months.  Lung RADS 2:  nodules that are non-concerning in appearance and behavior with a very low likelihood of becoming an active cancer. Recommendation is for a repeat annual scan in 12 months.  Lung RADS 3: nodules that are probably non-concerning , includes nodules with a low likelihood of becoming an active cancer.  Recommendation is for a 6-month repeat screening scan. Often noted after an upper respiratory illness. We will be in touch to make sure you have no questions, and to schedule your 6-month scan.  Lung RADS 4 A: nodules with concerning findings, recommendation is most often for a follow up scan in 3 months or additional testing based on our provider's assessment of the scan. We will be in touch to make sure you have no questions and to schedule the recommended 3 month follow up scan.  Lung RADS 4 B:  indicates findings that are concerning. We will be in touch with you to schedule additional diagnostic testing based on our provider's  assessment of the scan.  Other options for assistance in smoking cessation (   As covered by your insurance benefits)  Hypnosis for smoking cessation  [employer] [phone]  Acupuncture for smoking cessation  [facility] [phone]   

## 2021-12-16 ENCOUNTER — Telehealth: Payer: Self-pay | Admitting: Gastroenterology

## 2021-12-16 NOTE — Telephone Encounter (Signed)
Kiara from [facility] called states they were unable to reach patient for his previsit.

## 2021-12-16 NOTE — Telephone Encounter (Signed)
Noted  

## 2021-12-16 NOTE — Telephone Encounter (Signed)
[facility] No show and LDCT appt canceled.

## 2021-12-16 NOTE — Progress Notes (Signed)
Unable to contact patient for pre op after multiple attempts, called daughter she said she would notify patient to answer, attempted again and straight to VM. Verified with office we had the correct number and let them know we were unsuccessful  in pre op call.

## 2021-12-17 ENCOUNTER — Ambulatory Visit (HOSPITAL_BASED_OUTPATIENT_CLINIC_OR_DEPARTMENT_OTHER): Payer: Medicare Other | Admitting: Anesthesiology

## 2021-12-17 ENCOUNTER — Other Ambulatory Visit: Payer: Self-pay

## 2021-12-17 ENCOUNTER — Ambulatory Visit (HOSPITAL_COMMUNITY)
Admission: RE | Admit: 2021-12-17 | Discharge: 2021-12-17 | Disposition: A | Discharge status: Home / Self Care | Payer: Medicare Other | Attending: Gastroenterology | Admitting: Gastroenterology

## 2021-12-17 ENCOUNTER — Encounter (HOSPITAL_COMMUNITY)
Admission: RE | Disposition: A | Discharge status: Home / Self Care | Payer: Self-pay | Source: Home / Self Care | Attending: Gastroenterology

## 2021-12-17 ENCOUNTER — Encounter (HOSPITAL_COMMUNITY): Payer: Self-pay | Admitting: Gastroenterology

## 2021-12-17 ENCOUNTER — Ambulatory Visit (HOSPITAL_COMMUNITY): Payer: Medicare Other | Admitting: Anesthesiology

## 2021-12-17 DIAGNOSIS — I10 Essential (primary) hypertension: Secondary | ICD-10-CM | POA: Diagnosis not present

## 2021-12-17 DIAGNOSIS — R140 Abdominal distension (gaseous): Secondary | ICD-10-CM | POA: Diagnosis not present

## 2021-12-17 DIAGNOSIS — K2100 Gastro-esophageal reflux disease with esophagitis, without bleeding: Secondary | ICD-10-CM | POA: Insufficient documentation

## 2021-12-17 DIAGNOSIS — I4891 Unspecified atrial fibrillation: Secondary | ICD-10-CM | POA: Insufficient documentation

## 2021-12-17 DIAGNOSIS — Z87891 Personal history of nicotine dependence: Secondary | ICD-10-CM

## 2021-12-17 DIAGNOSIS — K219 Gastro-esophageal reflux disease without esophagitis: Secondary | ICD-10-CM | POA: Insufficient documentation

## 2021-12-17 DIAGNOSIS — R197 Diarrhea, unspecified: Secondary | ICD-10-CM

## 2021-12-17 DIAGNOSIS — K449 Diaphragmatic hernia without obstruction or gangrene: Secondary | ICD-10-CM | POA: Diagnosis not present

## 2021-12-17 DIAGNOSIS — R1013 Epigastric pain: Secondary | ICD-10-CM | POA: Diagnosis not present

## 2021-12-17 DIAGNOSIS — R109 Unspecified abdominal pain: Secondary | ICD-10-CM

## 2021-12-17 DIAGNOSIS — E039 Hypothyroidism, unspecified: Secondary | ICD-10-CM | POA: Insufficient documentation

## 2021-12-17 DIAGNOSIS — R159 Full incontinence of feces: Secondary | ICD-10-CM

## 2021-12-17 DIAGNOSIS — K2970 Gastritis, unspecified, without bleeding: Secondary | ICD-10-CM | POA: Insufficient documentation

## 2021-12-17 DIAGNOSIS — I739 Peripheral vascular disease, unspecified: Secondary | ICD-10-CM | POA: Insufficient documentation

## 2021-12-17 HISTORY — PX: COLONOSCOPY WITH PROPOFOL: SHX5780

## 2021-12-17 HISTORY — PX: BIOPSY: SHX5522

## 2021-12-17 HISTORY — PX: ESOPHAGOGASTRODUODENOSCOPY: SHX5428

## 2021-12-17 SURGERY — COLONOSCOPY WITH PROPOFOL
Anesthesia: Monitor Anesthesia Care

## 2021-12-17 MED ORDER — SODIUM CHLORIDE 0.9 % IV SOLN: INTRAVENOUS | Status: DC

## 2021-12-17 MED ORDER — LACTATED RINGERS IV SOLN
INTRAVENOUS | Status: AC | PRN
  Administered 2021-12-17: 1000 mL via INTRAVENOUS

## 2021-12-17 MED ORDER — PROPOFOL 10 MG/ML IV BOLUS
INTRAVENOUS | Status: AC
  Filled 2021-12-17: qty 20

## 2021-12-17 MED ORDER — HYDRALAZINE HCL 20 MG/ML IJ SOLN
INTRAMUSCULAR | Status: AC
  Filled 2021-12-17: qty 1

## 2021-12-17 MED ORDER — PROPOFOL 500 MG/50ML IV EMUL
INTRAVENOUS | Status: AC
  Filled 2021-12-17: qty 50

## 2021-12-17 MED ORDER — HYDRALAZINE HCL 20 MG/ML IJ SOLN
10.0000 mg | Freq: Once | INTRAMUSCULAR | Status: AC
  Administered 2021-12-17: 10 mg via INTRAVENOUS

## 2021-12-17 MED ORDER — PROPOFOL 500 MG/50ML IV EMUL
INTRAVENOUS | Status: DC | PRN
  Administered 2021-12-17: 125 ug/kg/min via INTRAVENOUS

## 2021-12-17 MED ORDER — PROPOFOL 10 MG/ML IV BOLUS
INTRAVENOUS | Status: DC | PRN
  Administered 2021-12-17 (×2): 20 mg via INTRAVENOUS
  Administered 2021-12-17 (×2): 30 mg via INTRAVENOUS

## 2021-12-17 SURGICAL SUPPLY — 22 items

## 2021-12-17 NOTE — Op Note (Signed)
[facility] Patient Name: Seth Snyder Procedure Date: 12/17/2021 MRN: [id number] Attending MD: Gladstone Pih. Candis Schatz , MD Date of Birth: June 26, 1960 CSN: [id number] Age: 61 Admit Type: Outpatient Procedure:                Colonoscopy Indications:              Clinically significant diarrhea of unexplained                            origin Providers:                Nicki Reaper E. Candis Schatz, MD, Burtis Junes, RN, Luan Moore, Technician, Brien Mates, RNFA, Lollie Sails CRNA, CRNA Referring MD:              Medicines:                Monitored Anesthesia Care Complications:            No immediate complications. Estimated Blood Loss:     Estimated blood loss was minimal. Procedure:                Pre-Anesthesia Assessment:                           - Prior to the procedure, a History and Physical                            was performed, and patient medications and                            allergies were reviewed. The patient's tolerance of                            previous anesthesia was also reviewed. The risks                            and benefits of the procedure and the sedation                            options and risks were discussed with the patient.                            All questions were answered, and informed consent                            was obtained. Prior Anticoagulants: The patient has                            taken Effient (prasugrel), last dose was 2 days                            prior to  procedure. ASA Grade Assessment: III - A                            patient with severe systemic disease. After                            reviewing the risks and benefits, the patient was                            deemed in satisfactory condition to undergo the                            procedure.                           - Prior to the procedure, a History and Physical                             was performed, and patient medications and                            allergies were reviewed. The patient's tolerance of                            previous anesthesia was also reviewed. The risks                            and benefits of the procedure and the sedation                            options and risks were discussed with the patient.                            All questions were answered, and informed consent                            was obtained. Prior Anticoagulants: The patient has                            taken Eliquis (apixaban), last dose was 2 days                            prior to procedure. ASA Grade Assessment: III - A                            patient with severe systemic disease. After                            reviewing the risks and benefits, the patient was                            deemed in satisfactory condition to undergo the  procedure.                           After obtaining informed consent, the colonoscope                            was passed under direct vision. Throughout the                            procedure, the patient's blood pressure, pulse, and                            oxygen saturations were monitored continuously. The                            CF-HQ190L ([id number]) Olympus colonoscope was                            introduced through the anus and advanced to the the                            terminal ileum, with identification of the                            appendiceal orifice and IC valve. The colonoscopy                            was performed without difficulty. The patient                            tolerated the procedure well. The quality of the                            bowel preparation was adequate. The terminal ileum,                            ileocecal valve, appendiceal orifice, and rectum                            were photographed. Scope In: 2:24:23 PM Scope Out: 2:40:57  PM Scope Withdrawal Time: 0 hours 14 minutes 26 seconds  Total Procedure Duration: 0 hours 16 minutes 34 seconds  Findings:      The perianal and digital rectal examinations were normal. Pertinent       negatives include normal sphincter tone and no palpable rectal lesions.      The colon (entire examined portion) appeared normal. Biopsies for       histology were taken with a cold forceps from the ascending colon,       transverse colon, descending colon and sigmoid colon for evaluation of       microscopic colitis. Estimated blood loss was minimal.      The terminal ileum appeared normal.      The retroflexed view of the distal rectum and anal verge was normal and       showed no anal or rectal abnormalities. Impression:               -  The entire examined colon is normal. Biopsied.                           - The examined portion of the ileum was normal.                           - The distal rectum and anal verge are normal on                            retroflexion view.                           - No endoscopic abnormalities to explain patient's                            symptoms. Moderate Sedation:      Not Applicable - Patient had care per Anesthesia. Recommendation:           - Patient has a contact number available for                            emergencies. The signs and symptoms of potential                            delayed complications were discussed with the                            patient. Return to normal activities tomorrow.                            Written discharge instructions were provided to the                            patient.                           - Resume previous diet.                           - Continue present medications.                           - Await pathology results.                           - Repeat colonoscopy in 10 years for screening                            purposes.                           - Further recommendations will  be based on                            pathology results. Procedure Code(s):        --- Professional ---  45380, Colonoscopy, flexible; with biopsy, single                            or multiple Diagnosis Code(s):        --- Professional ---                           R19.7, Diarrhea, unspecified CPT copyright 2019 American Medical Association. All rights reserved. The codes documented in this report are preliminary and upon coder review may  be revised to meet current compliance requirements. Michelyn Scullin E. Candis Schatz, MD 12/17/2021 2:55:46 PM This report has been signed electronically. Number of Addenda: 0

## 2021-12-17 NOTE — Transfer of Care (Signed)
Immediate Anesthesia Transfer of Care Note  Patient: Seth Snyder  Procedure(s) Performed: COLONOSCOPY WITH PROPOFOL ESOPHAGOGASTRODUODENOSCOPY (EGD) BIOPSY  Patient Location: PACU and Endoscopy Unit  Anesthesia Type:MAC  Level of Consciousness: awake, alert  and patient cooperative  Airway & Oxygen Therapy: Patient Spontanous Breathing and Patient connected to nasal cannula oxygen  Post-op Assessment: Report given to RN and Post -op Vital signs reviewed and stable  Post vital signs: Reviewed and stable  Last Vitals:  Vitals Value Taken Time  BP 160/105 12/17/21 1448  Temp    Pulse 60 12/17/21 1450  Resp 16 12/17/21 1450  SpO2 100 % 12/17/21 1450  Vitals shown include unvalidated device data.  Last Pain:  Vitals:   12/17/21 1345  TempSrc: Tympanic  PainSc: 0-No pain         Complications: No notable events documented.

## 2021-12-17 NOTE — Anesthesia Preprocedure Evaluation (Signed)
Anesthesia Evaluation  Patient identified by MRN, date of birth, ID band Patient awake    Reviewed: Allergy & Precautions, NPO status , Patient's Chart, lab work & pertinent test results  Airway Mallampati: II  TM Distance: >3 FB Neck ROM: Full    Dental no notable dental hx.    Pulmonary neg pulmonary ROS, former smoker,    Pulmonary exam normal breath sounds clear to auscultation       Cardiovascular hypertension, + Peripheral Vascular Disease  Normal cardiovascular exam+ dysrhythmias Atrial Fibrillation  Rhythm:Regular Rate:Normal     Neuro/Psych negative neurological ROS  negative psych ROS   GI/Hepatic Neg liver ROS, GERD  Medicated,  Endo/Other  Hypothyroidism   Renal/GU negative Renal ROS  negative genitourinary   Musculoskeletal negative musculoskeletal ROS (+)   Abdominal   Peds negative pediatric ROS (+)  Hematology negative hematology ROS (+)   Anesthesia Other Findings   Reproductive/Obstetrics negative OB ROS                             Anesthesia Physical Anesthesia Plan  ASA: 3  Anesthesia Plan: MAC   Post-op Pain Management: Minimal or no pain anticipated   Induction: Intravenous  PONV Risk Score and Plan: 1 and Propofol infusion and Treatment may vary due to age or medical condition  Airway Management Planned: Simple Face Mask  Additional Equipment:   Intra-op Plan:   Post-operative Plan:   Informed Consent: I have reviewed the patients History and Physical, chart, labs and discussed the procedure including the risks, benefits and alternatives for the proposed anesthesia with the patient or authorized representative who has indicated his/her understanding and acceptance.     Dental advisory given  Plan Discussed with: CRNA and Surgeon  Anesthesia Plan Comments:         Anesthesia Quick Evaluation

## 2021-12-17 NOTE — Anesthesia Procedure Notes (Signed)
Procedure Name: MAC Date/Time: 12/17/2021 2:09 PM  Performed by: Lollie Sails, CRNAPre-anesthesia Checklist: Patient identified, Emergency Drugs available, Suction available, Patient being monitored and Timeout performed Oxygen Delivery Method: Nasal cannula Placement Confirmation: positive ETCO2

## 2021-12-17 NOTE — H&P (Signed)
[facility] History and Physical   Primary Care Physician:  Fenton Foy, NP   Reason for Procedure:   Abdominal pain, diarrhea, bloating  Plan:    EGD, colonoscopy     HPI: Seth Snyder is a 61 y.o. male undergoing EGD and colonoscopy to evaluate chronic diarrhea with urgency/incontinence and burning epigastric pain and bloating.  Negative infectious work up, normal calprotectin.  Positive H. Pylori stool antigen.  Low B12 level   Past Medical History:  Diagnosis Date   Anemia    Ascending aortic aneurysm ([place]) 07/10/2021   Atrial fibrillation ([place])    Atypical chest pain 10/01/2020   Dizziness    Hypertension    Labile hypertension 07/10/2021   Low back pain     Past Surgical History:  Procedure Laterality Date   CARDIOVERSION N/A 05/10/2019   Procedure: CARDIOVERSION;  Surgeon: Lelon Perla, MD;  Location: [facility];  Service: Cardiovascular;  Laterality: N/A;   COLONOSCOPY  07/12/2013   LEFT HEART CATH AND CORONARY ANGIOGRAPHY N/A 08/30/2019   Procedure: LEFT HEART CATH AND CORONARY ANGIOGRAPHY;  Surgeon: Jettie Booze, MD;  Location: [facility];  Service: Cardiovascular;  Laterality: N/A;   TEE WITHOUT CARDIOVERSION N/A 05/10/2019   Procedure: TRANSESOPHAGEAL ECHOCARDIOGRAM (TEE);  Surgeon: Lelon Perla, MD;  Location: [facility] ENDOSCOPY;  Service: Cardiovascular;  Laterality: N/A;    Prior to Admission medications   Medication Sig Start Date End Date Taking? Authorizing Provider  acetaminophen (TYLENOL) 500 MG tablet Take 500 mg by mouth as needed. 03/12/19  Yes [provider]  albuterol (VENTOLIN HFA) 108 (90 Base) MCG/ACT inhaler Inhale 2 puffs into the lungs every 6 (six) hours as needed for wheezing or shortness of breath. 09/08/20  Yes Magdalen Spatz, NP  amiodarone (PACERONE) 200 MG tablet Take 1 tablet (200 mg total) by mouth daily. 07/23/21  Yes Loel Dubonnet, NP  amLODipine (NORVASC) 2.5 MG tablet Take 2.5 mg  by mouth daily. 08/30/21  Yes [provider]  ANORO ELLIPTA 62.5-25 MCG/INH AEPB Inhale 1 puff into the lungs 2 (two) times daily as needed (sob/wheezing). 11/03/20  Yes [provider]  atorvastatin (LIPITOR) 40 MG tablet Take 1 tablet (40 mg total) by mouth daily at 6 PM. 04/20/21  Yes Tillery, Satira Mccallum, PA-C  Bismuth 262 MG CHEW Chew 524 mg by mouth in the morning, at noon, in the evening, and at bedtime. 11/26/21  Yes Daryel November, MD  carvedilol (COREG) 25 MG tablet Take 1 tablet (25 mg total) by mouth 2 (two) times daily. 04/20/21  Yes Shirley Friar, PA-C  citalopram (CELEXA) 40 MG tablet Take 1 tablet (40 mg total) by mouth daily. 09/11/20  Yes Nevada Crane, MD  cyanocobalamin (VITAMIN B12) 1000 MCG/ML injection Inject 1078mcg IM weekly X 4 doses 11/12/21  Yes Daryel November, MD  doxycycline (VIBRAMYCIN) 100 MG capsule Take 1 capsule (100 mg total) by mouth 2 (two) times daily. 11/26/21  Yes Daryel November, MD  DULoxetine (CYMBALTA) 60 MG capsule Take 1 capsule (60 mg total) by mouth daily. Please call and make overdue appt for further refills, 1st attempt 08/20/21  Yes Marcial Pacas, MD  esomeprazole (NEXIUM) 40 MG capsule Take 1 capsule (40 mg total) by mouth daily. 08/24/21  Yes Passmore, Jake Church I, NP  gabapentin (NEURONTIN) 300 MG capsule Take 3 capsules (900 mg total) by mouth at bedtime. For nerve pain 10/02/21 10/02/22 Yes Lovorn, Jinny Blossom, MD  Heating Pads ([employer]  HEATING PAD) PADS 1 application by Does not apply route 3 (three) times daily. 15 minute intervals only 11/12/20  Yes King, Diona Foley, NP  hydrALAZINE (APRESOLINE) 25 MG tablet Take 1 tablet (25 mg total) by mouth 2 (two) times daily. [place] 07/10/21  Yes Skeet Latch, MD  hydrOXYzine (ATARAX) 10 MG tablet Take 1 tablet (10 mg total) by mouth 3 (three) times daily as needed. For itch 05/20/21  Yes Vevelyn Francois, NP  levothyroxine (SYNTHROID) 75 MCG tablet Take 1 tablet (75 mcg total) by  mouth daily. 10/19/21 10/19/22 Yes Fenton Foy, NP  methocarbamol (ROBAXIN) 500 MG tablet Take 500 mg by mouth as needed. 03/12/19  Yes [provider]  methylPREDNISolone (MEDROL DOSEPAK) 4 MG TBPK tablet Take as directed until finished 12/02/21  Yes Belaya, Maria A, PA-C  metroNIDAZOLE (FLAGYL) 250 MG tablet Take 1 tablet (250 mg total) by mouth 4 (four) times daily. 11/26/21  Yes Daryel November, MD  traZODone (DESYREL) 50 MG tablet Take half tablet at bedtime as needed for sleep. 09/11/20  Yes Nevada Crane, MD  triamcinolone ointment (KENALOG) 0.5 % APPLY TO AFFECTED AREA TWICE A DAY 07/01/21  Yes Passmore, Tewana I, NP  ammonium lactate (AMLACTIN) 12 % cream Apply topically. 03/12/19   [provider]  apixaban (ELIQUIS) 5 MG TABS tablet Take 1 tablet (5 mg total) by mouth 2 (two) times daily. 04/20/21   Shirley Friar, PA-C  cyanocobalamin (VITAMIN B12) 1000 MCG/ML injection Inject 1024mcg IM monthly 11/12/21   Daryel November, MD  Elastic Bandages & Supports (ADJUSTABLE ARM SLING) MISC 1 application. by Does not apply route daily. 06/08/21   Vevelyn Francois, NP  Elastic Bandages & Supports (FITRITE LUMBAR BACK BRACE) MISC 1 each by Does not apply route daily as needed (low back pain). 08/04/21   Bo Merino I, NP  fluticasone-salmeterol (ADVAIR HFA) 376-28 MCG/ACT inhaler SMARTSIG:2 Puff(s) Via Inhaler Twice Daily 06/22/21   [provider]  loperamide (IMODIUM A-D) 2 MG tablet Take 1 tablet (2 mg total) by mouth 4 (four) times daily as needed for diarrhea or loose stools. 11/10/21   Daryel November, MD  omeprazole (PRILOSEC) 20 MG capsule Take 1 capsule (20 mg total) by mouth daily. 11/26/21   Daryel November, MD  Oxcarbazepine (TRILEPTAL) 300 MG tablet Take 1 tablet (300 mg total) by mouth at bedtime. X 1 week, then 600 mg nightly x 1 week, then 900 mg nightly- for nerve pain 06/19/21   Lovorn, Jinny Blossom, MD    Current Facility-Administered  Medications  Medication Dose Route Frequency Provider Last Rate Last Admin   0.9 %  sodium chloride infusion   Intravenous Continuous Daryel November, MD        Allergies as of 11/10/2021 - Review Complete 11/10/2021  Allergen Reaction Noted   Entresto [sacubitril-valsartan]  01/04/2020    Family History  Problem Relation Age of Onset   Diabetes Mother    Other Father        unsure of medical history   Diabetes Sister    Mental illness Sister    Mental illness Brother    Colon cancer Neg Hx    Esophageal cancer Neg Hx    Rectal cancer Neg Hx    Liver cancer Neg Hx    Stomach cancer Neg Hx     Social History   Socioeconomic History   Marital status: Single    Spouse name: Not on file  Number of children: 3   Years of education: 8th grade   Highest education level: Not on file  Occupational History   Occupation: Disabled  Tobacco Use   Smoking status: Former    Types: Cigarettes    Quit date: 12/21/2018    Years since quitting: 2.9   Smokeless tobacco: Never  Vaping Use   Vaping Use: Never used  Substance and Sexual Activity   Alcohol use: Yes    Comment: occ beers   Drug use: Never   Sexual activity: Not Currently  Other Topics Concern   Not on file  Social History Narrative   Right-handed.   Lives at home with his daughter and son-in-law.   No daily use of caffeine.      Social Determinants of Health   Financial Resource Strain: Medium Risk (06/24/2020)   Overall Financial Resource Strain (CARDIA)    Difficulty of Paying Living Expenses: Somewhat hard  Food Insecurity: No Food Insecurity (06/24/2020)   Hunger Vital Sign    Worried About Running Out of Food in the Last Year: Never true    Ran Out of Food in the Last Year: Never true  Transportation Needs: No Transportation Needs (06/24/2020)   PRAPARE - Hydrologist (Medical): No    Lack of Transportation (Non-Medical): No  Physical Activity: Not on file  Stress: Not on  file  Social Connections: Not on file  Intimate Partner Violence: Not on file    Review of Systems:  All other review of systems negative except as mentioned in the HPI.  Physical Exam: Vital signs BP (!) 210/94   Pulse (!) 56   Temp (!) 96.9 F (36.1 C) (Tympanic)   Resp 16   Ht 5\' 7"  (1.702 m)   Wt 72.1 kg   SpO2 100%   BMI 24.90 kg/m   General:   Alert,  Well-developed, well-nourished, pleasant and cooperative in NAD.  Video interpreter used to communicate with patient. Airway:  Mallampati 3 Lungs:  Clear throughout to auscultation.   Heart:  Regular rate and rhythm; no murmurs, clicks, rubs,  or gallops. Abdomen:  Soft, nontender and nondistended. Normal bowel sounds.   Neuro/Psych:  Normal mood and affect. A and O x 3   Archit Leger E. Candis Schatz, MD [facility]

## 2021-12-17 NOTE — Anesthesia Postprocedure Evaluation (Signed)
Anesthesia Post Note  Patient: Seth Snyder, mining  Procedure(s) Performed: COLONOSCOPY WITH PROPOFOL ESOPHAGOGASTRODUODENOSCOPY (EGD) BIOPSY     Patient location during evaluation: PACU Anesthesia Type: MAC Level of consciousness: awake and alert Pain management: pain level controlled Vital Signs Assessment: post-procedure vital signs reviewed and stable Respiratory status: spontaneous breathing, nonlabored ventilation, respiratory function stable and patient connected to nasal cannula oxygen Cardiovascular status: stable and blood pressure returned to baseline Postop Assessment: no apparent nausea or vomiting Anesthetic complications: no   No notable events documented.  Last Vitals:  Vitals:   12/17/21 1515 12/17/21 1520  BP: (!) 212/98 (!) 204/95  Pulse:    Resp:    Temp:    SpO2:      Last Pain:  Vitals:   12/17/21 1508  TempSrc:   PainSc: 0-No pain                 [name] S

## 2021-12-17 NOTE — Discharge Instructions (Signed)
YOU HAD AN ENDOSCOPIC PROCEDURE TODAY: Refer to the procedure report and other information in the discharge instructions given to you for any specific questions about what was found during the examination. If this information does not answer your questions, please call [place] office at [phone] to clarify.  ° °YOU SHOULD EXPECT: Some feelings of bloating in the abdomen. Passage of more gas than usual. Walking can help get rid of the air that was put into your GI tract during the procedure and reduce the bloating. If you had a lower endoscopy (such as a colonoscopy or flexible sigmoidoscopy) you may notice spotting of blood in your stool or on the toilet paper. Some abdominal soreness may be present for a day or two, also. ° °DIET: Your first meal following the procedure should be a light meal and then it is ok to progress to your normal diet. A half-sandwich or bowl of soup is an example of a good first meal. Heavy or fried foods are harder to digest and may make you feel nauseous or bloated. Drink plenty of fluids but you should avoid alcoholic beverages for 24 hours. If you had a esophageal dilation, please see attached instructions for diet.   ° °ACTIVITY: Your care partner should take you home directly after the procedure. You should plan to take it easy, moving slowly for the rest of the day. You can resume normal activity the day after the procedure however YOU SHOULD NOT DRIVE, use power tools, machinery or perform tasks that involve climbing or major physical exertion for 24 hours (because of the sedation medicines used during the test).  ° °SYMPTOMS TO REPORT IMMEDIATELY: °A gastroenterologist can be reached at any hour. Please call [phone]  for any of the following symptoms:  °Following lower endoscopy (colonoscopy, flexible sigmoidoscopy) °Excessive amounts of blood in the stool  °Significant tenderness, worsening of abdominal pains  °Swelling of the abdomen that is new, acute  °Fever of 100° or  higher  °Following upper endoscopy (EGD, EUS, ERCP, esophageal dilation) °Vomiting of blood or coffee ground material  °New, significant abdominal pain  °New, significant chest pain or pain under the shoulder blades  °Painful or persistently difficult swallowing  °New shortness of breath  °Black, tarry-looking or red, bloody stools ° °FOLLOW UP:  °If any biopsies were taken you will be contacted by phone or by letter within the next 1-3 weeks. Call [phone]  if you have not heard about the biopsies in 3 weeks.  °Please also call with any specific questions about appointments or follow up tests. ° °

## 2021-12-17 NOTE — Op Note (Signed)
[facility] Patient Name: Seth Snyder Procedure Date: 12/17/2021 MRN: [id number] Attending MD: Seth Snyder , MD Date of Birth: [birth year] CSN: [id number] Age: 61 Admit Type: Outpatient Procedure:                Upper GI endoscopy Indications:              Epigastric abdominal pain, Abdominal bloating Providers:                Nicki Reaper E. Candis Schatz, MD, Burtis Junes, RN, Velva Harman, RN, Luan Moore, Technician, Brien Mates, RNFA, Edman Circle. [name] CRNA, CRNA Referring MD:              Medicines:                Monitored Anesthesia Care Complications:            No immediate complications. Estimated Blood Loss:     Estimated blood loss was minimal. Procedure:                Pre-Anesthesia Assessment:                           - Prior to the procedure, a History and Physical                            was performed, and patient medications and                            allergies were reviewed. The patient's tolerance of                            previous anesthesia was also reviewed. The risks                            and benefits of the procedure and the sedation                            options and risks were discussed with the patient.                            All questions were answered, and informed consent                            was obtained. Prior Anticoagulants: The patient has                            taken Effient (prasugrel), last dose was 2 days                            prior to procedure. ASA Grade Assessment: III - A  patient with severe systemic disease. After                            reviewing the risks and benefits, the patient was                            deemed in satisfactory condition to undergo the                            procedure.                           After obtaining informed consent, the endoscope was                             passed under direct vision. Throughout the                            procedure, the patient's blood pressure, pulse, and                            oxygen saturations were monitored continuously. The                            [id number]) Olympus endoscope was introduced                            through the mouth, and advanced to the second part                            of duodenum. The upper GI endoscopy was                            accomplished without difficulty. The patient                            tolerated the procedure well. Scope In: Scope Out: Findings:      LA Grade A (one or more mucosal breaks less than 5 mm, not extending       between tops of 2 mucosal folds) esophagitis was found.      The exam of the esophagus was otherwise normal.      Scattered inflammation characterized by erosions was found in the       gastric body. The antrum appeared normal. Biopsies were taken with a       cold forceps from the antrum and body for Helicobacter pylori testing.       Estimated blood loss was minimal.      A 3 cm hiatal hernia was present.      The exam of the stomach was otherwise normal.      The examined duodenum was normal. Biopsies for histology were taken with       a cold forceps for evaluation of celiac disease. Estimated blood loss       was minimal. Impression:               - LA Grade A reflux  esophagitis.                           - Gastritis. Biopsied.                           - 3 cm hiatal hernia.                           - Normal examined duodenum. Biopsied. Moderate Sedation:      Not Applicable - Patient had care per Anesthesia. Recommendation:           - Patient has a contact number available for                            emergencies. The signs and symptoms of potential                            delayed complications were discussed with the                            patient. Return to normal activities tomorrow.                             Written discharge instructions were provided to the                            patient.                           - Resume previous diet.                           - Continue present medications. Increase omeprazole                            to twice daily dosing                           - Await pathology results.                           - Further recommendations will be based on                            pathology results.                           - Ok to resume Eliquis tomorrow. Procedure Code(s):        --- Professional ---                           [phone], Esophagogastroduodenoscopy, flexible,                            transoral; with biopsy, single or multiple Diagnosis Code(s):        --- Professional ---  K21.00, Gastro-esophageal reflux disease with                            esophagitis, without bleeding                           K29.70, Gastritis, unspecified, without bleeding                           K44.9, Diaphragmatic hernia without obstruction or                            gangrene                           R10.13, Epigastric pain                           R14.0, Abdominal distension (gaseous) CPT copyright 2019 [employer] Association. All rights reserved. The codes documented in this report are preliminary and upon coder review may  be revised to meet current compliance requirements. Seth Snyder E. Candis Schatz, MD 12/17/2021 2:51:50 PM This report has been signed electronically. Number of Addenda: 0

## 2021-12-18 ENCOUNTER — Encounter: Payer: Self-pay | Admitting: Orthopaedic Surgery

## 2021-12-18 ENCOUNTER — Ambulatory Visit (INDEPENDENT_AMBULATORY_CARE_PROVIDER_SITE_OTHER): Payer: Medicare Other | Admitting: Orthopaedic Surgery

## 2021-12-18 VITALS — BP 129/87 | HR 65 | Ht 67.0 in | Wt 158.0 lb

## 2021-12-18 DIAGNOSIS — M5020 Other cervical disc displacement, unspecified cervical region: Secondary | ICD-10-CM

## 2021-12-18 DIAGNOSIS — M5416 Radiculopathy, lumbar region: Secondary | ICD-10-CM | POA: Diagnosis not present
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
## 2021-12-19 IMAGING — CR DG ORBITS FOR FOREIGN BODY
2 series · 2 of 2 positions shown · non-contrast
Comparison: None.

CLINICAL DATA: Metal working/exposure; clearance prior to MRI

EXAM:
ORBITS FOR FOREIGN BODY - 2 VIEW

[w orbit pa (1 of 2)]
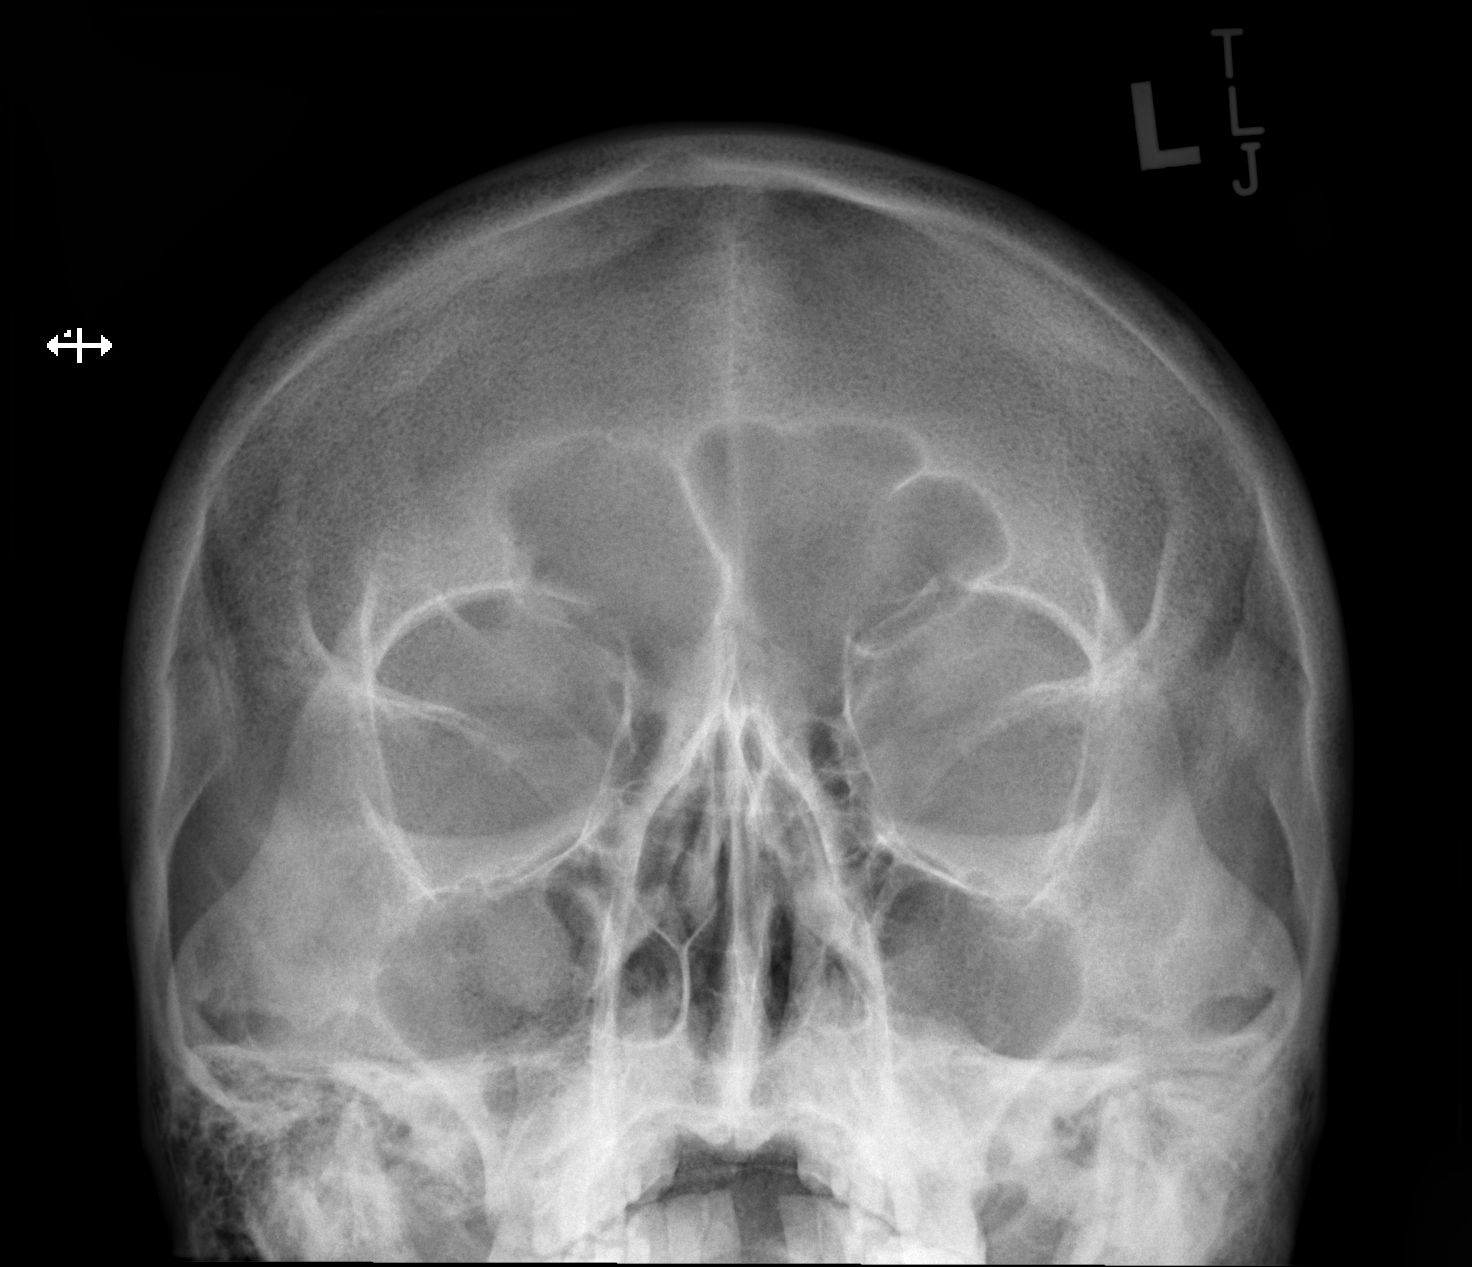

[w orbit pa (2 of 2)]
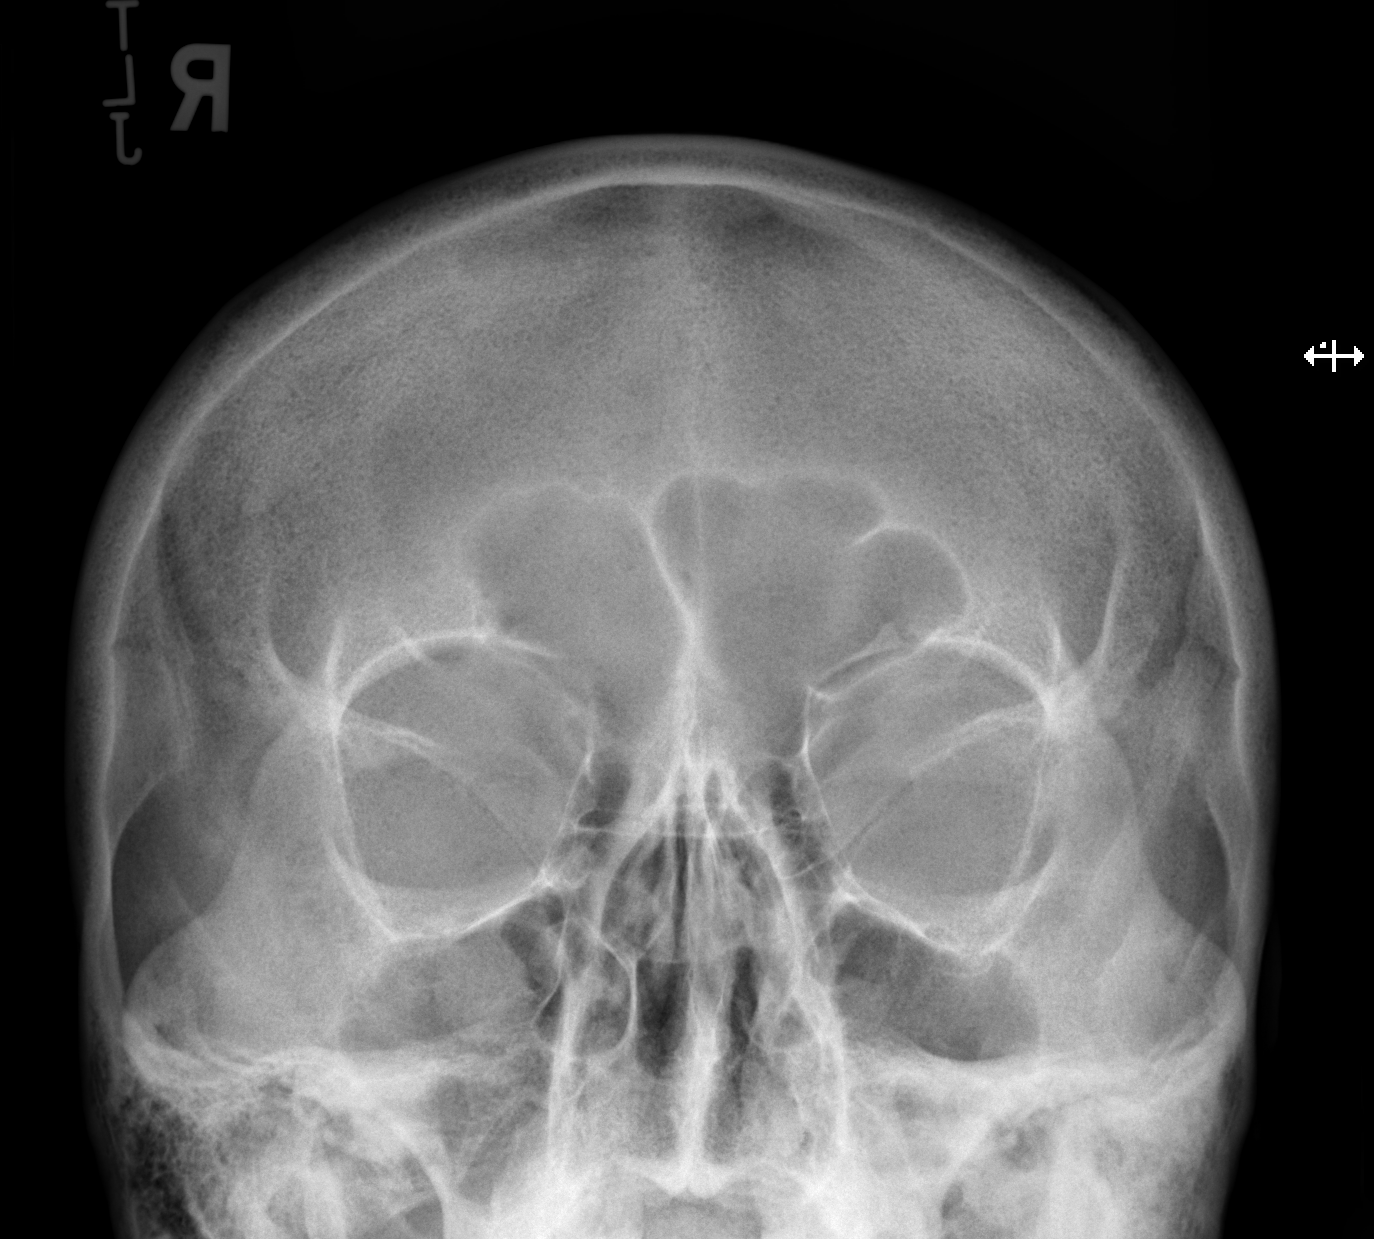

[2 of 2 positions shown; findings below may reference images not displayed]

FINDINGS: There is no evidence of metallic foreign body within the orbits. No
significant bone abnormality identified.
IMPRESSION: No evidence of metallic foreign body within the orbits.
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
## 2021-12-21 ENCOUNTER — Other Ambulatory Visit: Payer: Self-pay | Admitting: Gastroenterology

## 2021-12-21 ENCOUNTER — Encounter (HOSPITAL_COMMUNITY): Payer: Self-pay | Admitting: Gastroenterology

## 2021-12-21 LAB — SURGICAL PATHOLOGY

## 2021-12-21 NOTE — Progress Notes (Signed)
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  This is irritation of the duodenal mucosa typically from stomach acid. The biopsies taken from your stomach were notable for mild reactive gastropathy which is a common finding and often related to use of certain medications (usually NSAIDs), but there was no evidence of Helicobacter pylori infection.  This is suggestive that the bacteria was eradicated with the antibiotics that you took prior to the procedure.   As discussed, your EGD was also notable for a hiatal hernia and reflux esophagitis.  For this reason and because of the peptic duodenitis, I would recommend you stay on an acid reducing medicine such as esomeprazole 40 mg once daily.  The biopsies of your colon were normal and did not demonstrate any evidence of microscopic colitis. Please follow-up with me in the office to discuss further evaluation and management of your chronic GI symptoms.

## 2021-12-23 ENCOUNTER — Ambulatory Visit (HOSPITAL_BASED_OUTPATIENT_CLINIC_OR_DEPARTMENT_OTHER): Payer: Medicare Other

## 2021-12-25 ENCOUNTER — Encounter (HOSPITAL_BASED_OUTPATIENT_CLINIC_OR_DEPARTMENT_OTHER): Payer: Self-pay | Admitting: Nurse Practitioner

## 2021-12-25 ENCOUNTER — Ambulatory Visit (INDEPENDENT_AMBULATORY_CARE_PROVIDER_SITE_OTHER): Payer: Medicare Other | Admitting: Nurse Practitioner

## 2021-12-25 VITALS — BP 116/76 | HR 61 | Ht 67.0 in | Wt 156.3 lb

## 2021-12-25 DIAGNOSIS — I4819 Other persistent atrial fibrillation: Secondary | ICD-10-CM

## 2021-12-25 DIAGNOSIS — R42 Dizziness and giddiness: Secondary | ICD-10-CM

## 2021-12-25 DIAGNOSIS — R0989 Other specified symptoms and signs involving the circulatory and respiratory systems: Secondary | ICD-10-CM | POA: Diagnosis not present

## 2021-12-25 DIAGNOSIS — R296 Repeated falls: Secondary | ICD-10-CM

## 2021-12-25 DIAGNOSIS — M5416 Radiculopathy, lumbar region: Secondary | ICD-10-CM

## 2021-12-25 DIAGNOSIS — I77819 Aortic ectasia, unspecified site: Secondary | ICD-10-CM

## 2021-12-25 DIAGNOSIS — I5032 Chronic diastolic (congestive) heart failure: Secondary | ICD-10-CM

## 2021-12-25 DIAGNOSIS — W19XXXD Unspecified fall, subsequent encounter: Secondary | ICD-10-CM

## 2021-12-25 DIAGNOSIS — M5126 Other intervertebral disc displacement, lumbar region: Secondary | ICD-10-CM

## 2021-12-25 DIAGNOSIS — I428 Other cardiomyopathies: Secondary | ICD-10-CM

## 2021-12-25 NOTE — Patient Instructions (Signed)
Medication Instructions:  Your Physician recommend you continue on your current medication as directed.    *If you need a refill on your cardiac medications before your next appointment, please call your pharmacy*   Lab Work: Your physician recommends that you return for lab work today- TSH, Mag  If you have labs (blood work) drawn today and your tests are completely normal, you will receive your results only by: [place] (if you have MyChart) OR A paper copy in the mail If you have any lab test that is abnormal or we need to change your treatment, we will call you to review the results.  Testing/Procedures: Your physician has requested that you have a carotid duplex. This test is an ultrasound of the carotid arteries in your neck. It looks at blood flow through these arteries that supply the brain with blood. Allow one hour for this exam. There are no restrictions or special instructions.  Follow-Up: At [facility], you and your health needs are our priority.  As part of our continuing mission to provide you with exceptional heart care, we have created designated Provider Care Teams.  These Care Teams include your primary Cardiologist (physician) and Advanced Practice Providers (APPs -  Physician Assistants and Nurse Practitioners) who all work together to provide you with the care you need, when you need it.  We recommend signing up for the patient portal called "MyChart".  Sign up information is provided on this After Visit Summary.  MyChart is used to connect with patients for Virtual Visits (Telemedicine).  Patients are able to view lab/test results, encounter notes, upcoming appointments, etc.  Non-urgent messages can be sent to your provider as well.   To learn more about what you can do with MyChart, go to [email or web address].    Your next appointment:   1 month(s)  The format for your next appointment:   In Person  Provider:   Skeet Latch, MD or Laurann Montana, NP   Other Instructions Tips to Measure your Blood Pressure Correctly  To determine whether you have hypertension, a medical professional will take a blood pressure reading. How you prepare for the test, the position of your arm, and other factors can change a blood pressure reading by 10% or more. That could be enough to hide high blood pressure, start you on a drug you don't really need, or lead your doctor to incorrectly adjust your medications.  National and international guidelines offer specific instructions for measuring blood pressure. If a doctor, nurse, or medical assistant isn't doing it right, don't hesitate to ask him or her to get with the guidelines.  Here's what you can do to ensure a correct reading:  Don't drink a caffeinated beverage or smoke during the 30 minutes before the test.  Sit quietly for five minutes before the test begins.  During the measurement, sit in a chair with your feet on the floor and your arm supported so your elbow is at about heart level.  The inflatable part of the cuff should completely cover at least 80% of your upper arm, and the cuff should be placed on bare skin, not over a shirt.  Don't talk during the measurement.  Have your blood pressure measured twice, with a brief break in between. If the readings are different by 5 points or more, have it done a third time.  In 2017, new guidelines from the [place], the [employer], and nine other health organizations lowered the  diagnosis of high blood pressure to 130/80 mm Hg or higher for all adults. The guidelines also redefined the various blood pressure categories to now include normal, elevated, Stage 1 hypertension, Stage 2 hypertension, and hypertensive crisis (see "Blood pressure categories").  Blood pressure categories  Blood pressure category SYSTOLIC (upper number)  DIASTOLIC (lower number)  Normal Less than 120 mm Hg and Less than 80 mm Hg   Elevated 120-129 mm Hg and Less than 80 mm Hg  High blood pressure: Stage 1 hypertension 130-139 mm Hg or 80-89 mm Hg  High blood pressure: Stage 2 hypertension 140 mm Hg or higher or 90 mm Hg or higher  Hypertensive crisis (consult your doctor immediately) Higher than 180 mm Hg and/or Higher than 120 mm Hg  Source: American Heart Association and American Stroke Association. For more on getting your blood pressure under control, buy Controlling Your Blood Pressure, a Special Health Report from [facility].   Blood Pressure Log   Date   Time  Blood Pressure  Position  Example: Nov 1 9 AM 124/78 sitting                                                        Important Information About Sugar

## 2021-12-25 NOTE — Progress Notes (Addendum)
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 Tips to Measure your Blood Pressure Correctly  To determine whether you have hypertension, a medical professional will take a blood pressure reading. How you prepare for the test, the position of your arm, and other factors can change a blood pressure reading by 10% or more. That could be enough to hide high blood pressure, start you on a drug you don't really need, or lead your doctor to incorrectly adjust your medications.  National and international guidelines offer specific instructions for measuring blood pressure. If a doctor, nurse, or medical assistant isn't doing it right, don't hesitate to ask him or her to get with the guidelines.  Here's what you can do to ensure a correct reading:  Don't drink a caffeinated beverage or smoke during the 30 minutes before the test.  Sit quietly for five minutes before the test begins.  During the measurement, sit in a chair with your feet on the floor  and your arm supported so your elbow is at about heart level.  The inflatable part of the cuff should completely cover at least 80% of your upper arm, and the cuff should be placed on bare skin, not over a shirt.  Don't talk during the measurement.  Have your blood pressure measured twice, with a brief break in between. If the readings are different by 5 points or more, have it done a third time.  In 2017, new guidelines from the [employer], the [employer] of Cardiology, and nine other health organizations lowered the diagnosis of high blood pressure to 130/80 mm Hg or higher for all adults. The guidelines also redefined the various blood pressure categories to now include normal, elevated, Stage 1 hypertension, Stage 2 hypertension, and hypertensive crisis (see "Blood pressure categories").  Blood pressure categories  Blood pressure category SYSTOLIC (upper number)  DIASTOLIC (lower number)  Normal Less than 120 mm Hg and Less than 80 mm Hg  Elevated 120-129 mm Hg and Less than 80 mm Hg  High blood pressure: Stage 1 hypertension 130-139 mm Hg or 80-89 mm Hg  High blood pressure: Stage 2 hypertension 140 mm Hg or higher or 90 mm Hg or higher  Hypertensive crisis (consult your doctor immediately) Higher than 180 mm Hg and/or Higher than 120 mm Hg  Source: American Heart Association and American Stroke Association. For more on getting your blood pressure under control, buy Controlling Your Blood Pressure, a Special Health Report from [facility].   Blood Pressure Log   Date   Time  Blood Pressure  Position  Example: Nov 1 9 AM 124/78 sitting                                                        Important Information About Sugar  Signed, Finis Bud, NP  12/26/2021 7:50 PM    [place]

## 2021-12-26 ENCOUNTER — Encounter (HOSPITAL_BASED_OUTPATIENT_CLINIC_OR_DEPARTMENT_OTHER): Payer: Self-pay | Admitting: Nurse Practitioner

## 2021-12-26 DIAGNOSIS — I77819 Aortic ectasia, unspecified site: Secondary | ICD-10-CM | POA: Insufficient documentation

## 2021-12-26 DIAGNOSIS — M5126 Other intervertebral disc displacement, lumbar region: Secondary | ICD-10-CM | POA: Insufficient documentation

## 2021-12-26 DIAGNOSIS — W19XXXA Unspecified fall, initial encounter: Secondary | ICD-10-CM | POA: Insufficient documentation

## 2021-12-26 DIAGNOSIS — R42 Dizziness and giddiness: Secondary | ICD-10-CM | POA: Insufficient documentation

## 2021-12-26 LAB — TSH: TSH: 8.82 u[IU]/mL — ABNORMAL HIGH (ref 0.450–4.500)

## 2021-12-26 LAB — MAGNESIUM: Magnesium: 1.9 mg/dL (ref 1.6–2.3)

## 2022-01-19 ENCOUNTER — Encounter: Payer: Medicare Other | Admitting: Acute Care

## 2022-01-21 ENCOUNTER — Ambulatory Visit (HOSPITAL_BASED_OUTPATIENT_CLINIC_OR_DEPARTMENT_OTHER): Payer: Medicare Other

## 2022-01-25 ENCOUNTER — Ambulatory Visit: Payer: Medicare Other | Admitting: Physical Medicine and Rehabilitation

## 2022-01-27 ENCOUNTER — Telehealth: Payer: Self-pay | Admitting: Acute Care

## 2022-01-27 MED ORDER — ALBUTEROL SULFATE HFA 108 (90 BASE) MCG/ACT IN AERS: 2.0000 | INHALATION_SPRAY | Freq: Four times a day (QID) | RESPIRATORY_TRACT | 0 refills | Status: DC | PRN

## 2022-01-27 MED ORDER — FLUTICASONE-SALMETEROL 115-21 MCG/ACT IN AERO: 2.0000 | INHALATION_SPRAY | Freq: Two times a day (BID) | RESPIRATORY_TRACT | 0 refills | Status: DC

## 2022-01-27 NOTE — Telephone Encounter (Signed)
Called and left voicemail for patient that I am able to send in 1 refill of his Advair inhaler and his albuterol that Dr Isaiah Serge had him on last year. But he needs to call back for a follow up appointment with [place] to make sure he is doing ok with his COPD. [facility]. Nothing further needed

## 2022-01-31 ENCOUNTER — Other Ambulatory Visit: Payer: Self-pay | Admitting: Pulmonary Disease

## 2022-02-01 NOTE — Telephone Encounter (Signed)
Refill just sent 5 days ago

## 2022-02-03 ENCOUNTER — Ambulatory Visit (HOSPITAL_BASED_OUTPATIENT_CLINIC_OR_DEPARTMENT_OTHER)
Admission: RE | Admit: 2022-02-03 | Discharge: 2022-02-03 | Disposition: A | Discharge status: Home / Self Care | Payer: Medicare Other | Source: Ambulatory Visit | Attending: Acute Care | Admitting: Acute Care

## 2022-02-03 DIAGNOSIS — Z87891 Personal history of nicotine dependence: Secondary | ICD-10-CM | POA: Diagnosis present

## 2022-02-03 DIAGNOSIS — Z122 Encounter for screening for malignant neoplasm of respiratory organs: Secondary | ICD-10-CM | POA: Insufficient documentation

## 2022-02-04 ENCOUNTER — Ambulatory Visit (INDEPENDENT_AMBULATORY_CARE_PROVIDER_SITE_OTHER): Payer: Medicare Other

## 2022-02-04 DIAGNOSIS — R42 Dizziness and giddiness: Secondary | ICD-10-CM | POA: Diagnosis not present

## 2022-02-05 ENCOUNTER — Other Ambulatory Visit: Payer: Self-pay | Admitting: Acute Care

## 2022-02-05 DIAGNOSIS — Z87891 Personal history of nicotine dependence: Secondary | ICD-10-CM

## 2022-02-05 DIAGNOSIS — Z122 Encounter for screening for malignant neoplasm of respiratory organs: Secondary | ICD-10-CM

## 2022-02-15 ENCOUNTER — Ambulatory Visit (INDEPENDENT_AMBULATORY_CARE_PROVIDER_SITE_OTHER): Payer: Medicare Other | Admitting: Family

## 2022-02-15 ENCOUNTER — Encounter (HOSPITAL_BASED_OUTPATIENT_CLINIC_OR_DEPARTMENT_OTHER): Payer: Self-pay | Admitting: Family

## 2022-02-15 VITALS — BP 144/96 | HR 65 | Ht 67.0 in | Wt 158.0 lb

## 2022-02-15 DIAGNOSIS — I10 Essential (primary) hypertension: Secondary | ICD-10-CM | POA: Diagnosis not present

## 2022-02-15 DIAGNOSIS — I4819 Other persistent atrial fibrillation: Secondary | ICD-10-CM | POA: Diagnosis not present

## 2022-02-15 DIAGNOSIS — D6859 Other primary thrombophilia: Secondary | ICD-10-CM | POA: Diagnosis not present

## 2022-02-15 DIAGNOSIS — M5416 Radiculopathy, lumbar region: Secondary | ICD-10-CM | POA: Diagnosis not present
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
## 2022-02-15 NOTE — Patient Instructions (Addendum)
Medication Instructions:  Continue your current medications .  *If you need a refill on your cardiac medications before your next appointment, please call your pharmacy*  Lab Work/Testing/Procedures:  None ordered today.   Carotid duplex showed mild stenosis (plaque) in the bilateral carotid arteries, but not enough to cause symptoms. Atorvastatin helps prevent this from getting worse.    Follow-Up: At [facility], you and your health needs are our priority.  As part of our continuing mission to provide you with exceptional heart care, we have created designated Provider Care Teams.  These Care Teams include your primary Cardiologist (physician) and Advanced Practice Providers (APPs -  Physician Assistants and Nurse Practitioners) who all work together to provide you with the care you need, when you need it.  We recommend signing up for the patient portal called "MyChart".  Sign up information is provided on this After Visit Summary.  MyChart is used to connect with patients for Virtual Visits (Telemedicine).  Patients are able to view lab/test results, encounter notes, upcoming appointments, etc.  Non-urgent messages can be sent to your provider as well.   To learn more about what you can do with MyChart, go to [email or web address].    Your next appointment:   As needed with Dr. Duke Salvia.  Call us if needed after move to [place].   Other Instructions  Chest and back pain is likely due to nerve related. Recommend discussing with orthopedics and primary care provider. They may want to adjust your dose of Gabapentin.

## 2022-02-24 ENCOUNTER — Encounter: Payer: Medicare Other | Admitting: Acute Care

## 2022-02-24 ENCOUNTER — Telehealth: Payer: Self-pay | Admitting: Acute Care

## 2022-02-24 NOTE — Telephone Encounter (Signed)
Patient had a 9:30 AM shared decision making visit by phone.  I called the interpreter assist line requested the [place]ali language and the interpreter made the call to the patient's phone number.  The phone goes directly to voicemail.  The interpreter tried again and the phone again went right to voicemail. After disconnecting with the initial interpreter I called the patient's number and he answered the phone.  I explained that when we tried calling with the interpreter on the line that his phone had gone directly to voicemail without ringing twice.  He requested I called the interpreter again .  I explained to him that he needed to make an adjustment to his phone to ensure that it rang so that he could answer when I called with the second interpreter. I called the interpreter assist line again I requested an [place]ali interpreter and this interpreter again called the patient's number.  Again the phone went directly to voicemail.  Once these 4 calls have been made it was 9:50 AM, which did not leave enough time to complete the visit before my 10 AM patient.,  We will reschedule this shared decision making visit. Angelique Blonder, we will need to reschedule the patient in the office with an interpreter to ensure this visit gets completed.  Thank you so much

## 2022-03-05 ENCOUNTER — Other Ambulatory Visit: Payer: Self-pay | Admitting: Nurse Practitioner

## 2022-03-11 ENCOUNTER — Ambulatory Visit: Payer: Self-pay | Admitting: Nurse Practitioner

## 2022-03-11 ENCOUNTER — Ambulatory Visit (HOSPITAL_BASED_OUTPATIENT_CLINIC_OR_DEPARTMENT_OTHER): Payer: Medicare Other | Admitting: Cardiovascular Disease

## 2022-03-18 NOTE — Telephone Encounter (Signed)
Called and spoke with pt to reschedule shared decision making visit. Pt states he has moved to [place] now. I encouraged pt to find a Dr that would enroll him in a lung screening program there. Pt verbalized understanding.

## 2022-03-23 ENCOUNTER — Other Ambulatory Visit: Payer: Self-pay | Admitting: Student

## 2022-03-23 DIAGNOSIS — I4819 Other persistent atrial fibrillation: Secondary | ICD-10-CM

## 2022-03-24 NOTE — Telephone Encounter (Signed)
Prescription refill request for Eliquis received. Indication: Afib  Last office visit: 02/15/22 Gilford Rile)  Scr: 0.93 (12/02/21)  Age: 62 Weight: 71.7kg   Appropriate dose and refill sent to requested pharmacy.

## 2022-04-02 ENCOUNTER — Ambulatory Visit: Payer: Medicare Other | Admitting: Physical Medicine and Rehabilitation

## 2022-04-03 ENCOUNTER — Other Ambulatory Visit: Payer: Self-pay | Admitting: Neurology

## 2022-04-03 ENCOUNTER — Other Ambulatory Visit: Payer: Self-pay | Admitting: Pulmonary Disease

## 2022-04-03 ENCOUNTER — Other Ambulatory Visit: Payer: Self-pay | Admitting: Gastroenterology

## 2022-04-04 ENCOUNTER — Other Ambulatory Visit: Payer: Self-pay | Admitting: Gastroenterology

## 2022-04-04 ENCOUNTER — Other Ambulatory Visit: Payer: Self-pay | Admitting: Pulmonary Disease

## 2022-04-04 ENCOUNTER — Other Ambulatory Visit: Payer: Self-pay | Admitting: Neurology

## 2022-04-05 ENCOUNTER — Other Ambulatory Visit: Payer: Self-pay

## 2022-04-05 ENCOUNTER — Other Ambulatory Visit: Payer: Self-pay | Admitting: Physical Medicine and Rehabilitation

## 2022-04-05 DIAGNOSIS — E538 Deficiency of other specified B group vitamins: Secondary | ICD-10-CM

## 2022-04-05 DIAGNOSIS — L299 Pruritus, unspecified: Secondary | ICD-10-CM

## 2022-04-05 DIAGNOSIS — G8929 Other chronic pain: Secondary | ICD-10-CM

## 2022-04-05 MED ORDER — AMLODIPINE BESYLATE 2.5 MG PO TABS: 2.5000 mg | ORAL_TABLET | Freq: Every day | ORAL | 0 refills | Status: AC

## 2022-04-05 MED ORDER — ALBUTEROL SULFATE HFA 108 (90 BASE) MCG/ACT IN AERS: 2.0000 | INHALATION_SPRAY | Freq: Four times a day (QID) | RESPIRATORY_TRACT | 0 refills | Status: DC | PRN

## 2022-04-05 MED ORDER — HYDROXYZINE HCL 10 MG PO TABS: 10.0000 mg | ORAL_TABLET | Freq: Three times a day (TID) | ORAL | 0 refills | Status: AC | PRN

## 2022-04-05 MED ORDER — METHOCARBAMOL 500 MG PO TABS: 500.0000 mg | ORAL_TABLET | ORAL | 2 refills | Status: AC | PRN

## 2022-04-05 MED ORDER — FLUTICASONE-SALMETEROL 115-21 MCG/ACT IN AERO: 2.0000 | INHALATION_SPRAY | Freq: Two times a day (BID) | RESPIRATORY_TRACT | 0 refills | Status: DC

## 2022-04-05 MED ORDER — CVS HEATING PAD PADS: 1.0000 "application " | MEDICATED_PAD | Freq: Three times a day (TID) | 0 refills | Status: AC

## 2022-04-05 MED ORDER — FLUTICASONE-SALMETEROL 115-21 MCG/ACT IN AERO: 2.0000 | INHALATION_SPRAY | Freq: Two times a day (BID) | RESPIRATORY_TRACT | 0 refills | Status: AC

## 2022-04-05 MED ORDER — AMMONIUM LACTATE 12 % EX CREA: TOPICAL_CREAM | CUTANEOUS | 0 refills | Status: AC | PRN

## 2022-04-05 MED ORDER — ADJUSTABLE ARM SLING MISC: 1.0000 "application " | Freq: Every day | 0 refills | Status: AC

## 2022-04-05 MED ORDER — HYDRALAZINE HCL 50 MG PO TABS: 50.0000 mg | ORAL_TABLET | Freq: Two times a day (BID) | ORAL | 2 refills | Status: AC

## 2022-04-05 NOTE — Telephone Encounter (Signed)
Please advise KH 

## 2022-04-05 NOTE — Telephone Encounter (Signed)
From: Amiel Jens Som To: Office of Fenton Foy, NP Sent: 04/04/2022 12:21 AM EST Subject: Medication Renewal Request  Refills have been requested for the following medications:   ammonium lactate (AMLACTIN) 12 % cream   methocarbamol (ROBAXIN) 500 MG tablet   amLODipine (NORVASC) 2.5 MG tablet   hydrALAZINE (APRESOLINE) 50 MG tablet  [id number] [place], [place] - [address] Delivery method: Pickup   Medication renewals requested in this message routed separately:   DULoxetine (CYMBALTA) 60 MG capsule [Yijun Yan]    Heating Pads ([facility] HEATING PAD) PADS Donella Stade M King]   hydrOXYzine (ATARAX) 10 MG tablet [[name] M King]   Elastic Bandages & Supports (ADJUSTABLE ARM SLING) MISC [[name] M King]    fluticasone-salmeterol (ADVAIR HFA) 115-21 MCG/ACT inhaler [Praveen Mannam]   albuterol (VENTOLIN HFA) 108 (90 Base) MCG/ACT inhaler [Praveen Mannam]    cyanocobalamin (VITAMIN B12) 1000 MCG/ML injection [Scott E Cunningham]   metroNIDAZOLE (FLAGYL) 250 MG tablet [Scott E Cunningham]

## 2022-04-06 ENCOUNTER — Telehealth: Payer: Self-pay

## 2022-04-06 NOTE — Telephone Encounter (Signed)
Left VM via [place] interpreter to come to the lab between 8-5 to have B12 level checked before refilling B12.

## 2022-04-12 ENCOUNTER — Other Ambulatory Visit: Payer: Self-pay | Admitting: Gastroenterology

## 2022-04-12 ENCOUNTER — Other Ambulatory Visit: Payer: Self-pay | Admitting: Pulmonary Disease

## 2022-04-12 ENCOUNTER — Other Ambulatory Visit: Payer: Self-pay | Admitting: Neurology

## 2022-04-21 ENCOUNTER — Ambulatory Visit: Payer: Self-pay | Admitting: Nurse Practitioner

## 2022-04-27 ENCOUNTER — Other Ambulatory Visit: Payer: Self-pay | Admitting: Pulmonary Disease

## 2022-04-27 MED ORDER — ALBUTEROL SULFATE HFA 108 (90 BASE) MCG/ACT IN AERS: 2.0000 | INHALATION_SPRAY | Freq: Four times a day (QID) | RESPIRATORY_TRACT | 5 refills | Status: AC | PRN

## 2022-04-27 NOTE — Telephone Encounter (Signed)
Patient albuterol prescription was sent to pharmacy 04/05/22 with no additional refills. Albuterol additional refills sent to patient pharmacy. LOV 12/15/21.

## 2022-04-27 NOTE — Addendum Note (Signed)
Addended by: Elton Sin on: 04/27/2022 11:27 AM   Modules accepted: Orders

## 2022-04-29 ENCOUNTER — Encounter (HOSPITAL_COMMUNITY): Payer: Self-pay | Admitting: *Deleted

## 2022-05-11 ENCOUNTER — Other Ambulatory Visit: Payer: Self-pay | Admitting: *Deleted

## 2022-05-11 MED ORDER — ALBUTEROL SULFATE HFA 108 (90 BASE) MCG/ACT IN AERS: 2.0000 | INHALATION_SPRAY | Freq: Four times a day (QID) | RESPIRATORY_TRACT | 5 refills | Status: AC | PRN

## 2022-07-05 ENCOUNTER — Other Ambulatory Visit: Payer: Self-pay | Admitting: Pulmonary Disease

## 2022-09-06 ENCOUNTER — Other Ambulatory Visit: Payer: Self-pay | Admitting: Physical Medicine and Rehabilitation

## 2022-09-06 ENCOUNTER — Other Ambulatory Visit (HOSPITAL_BASED_OUTPATIENT_CLINIC_OR_DEPARTMENT_OTHER): Payer: Self-pay | Admitting: Cardiovascular Disease

## 2022-09-06 ENCOUNTER — Other Ambulatory Visit: Payer: Self-pay | Admitting: Nurse Practitioner

## 2022-09-06 DIAGNOSIS — I4819 Other persistent atrial fibrillation: Secondary | ICD-10-CM

## 2022-09-06 NOTE — Telephone Encounter (Signed)
Will have to get from PCP- since hasn't returned- thanks- ML

## 2022-09-07 NOTE — Telephone Encounter (Signed)
Pt last saw Gillian Shields, NP on 02/15/22, last labs 03/26/22 Creat 1.12, age 62, weight 71.7kg, based on specified criteria pt is on appropriate dosage of Eliquis 5mg  BID for afib.  Will refill rx.

## 2022-09-07 NOTE — Telephone Encounter (Signed)
Please review for refill. Thank you! 

## 2022-09-13 ENCOUNTER — Other Ambulatory Visit: Payer: Self-pay | Admitting: Physical Medicine and Rehabilitation

## 2022-09-13 ENCOUNTER — Other Ambulatory Visit (HOSPITAL_BASED_OUTPATIENT_CLINIC_OR_DEPARTMENT_OTHER): Payer: Self-pay | Admitting: Family

## 2022-09-13 DIAGNOSIS — R200 Anesthesia of skin: Secondary | ICD-10-CM

## 2022-09-13 NOTE — Telephone Encounter (Signed)
Rx(s) sent to pharmacy electronically.  

## 2022-10-15 IMAGING — DX DG CHEST 1V PORT
1 series · 1 of 1 positions shown · non-contrast
Comparison: 05/07/2019

CLINICAL DATA: Hypotension, neck pain, fatigue, nausea

EXAM:
PORTABLE CHEST 1 VIEW

[chest ap]
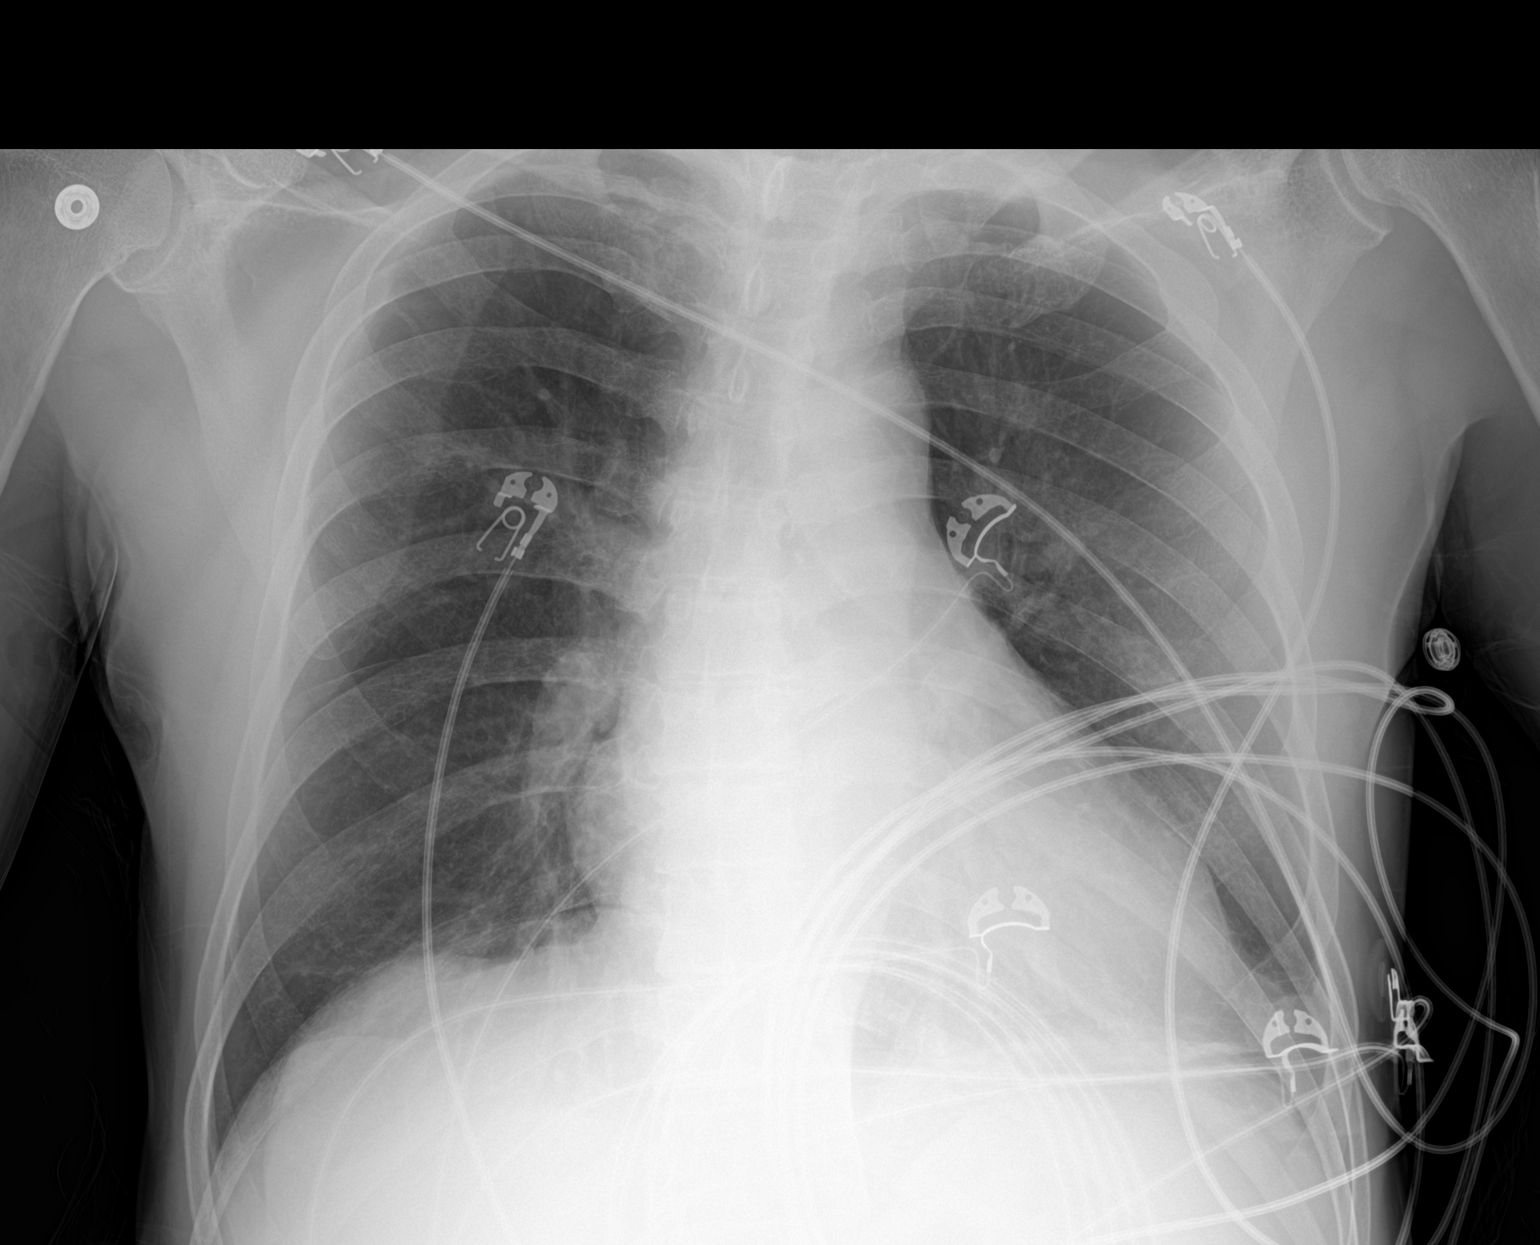

[1 of 1 positions shown; findings below may reference images not displayed]

FINDINGS: Single frontal view of the chest demonstrates an unremarkable
cardiac silhouette. No acute airspace disease, effusion, or
pneumothorax. Likely scarring or atelectasis within the left lower
lobe unchanged. No acute bony abnormalities.
IMPRESSION: 1. No acute intrathoracic process.

## 2022-10-22 IMAGING — DX DG CHEST 1V PORT
1 series · 1 of 1 positions shown · non-contrast
Comparison: 05/14/2020

CLINICAL DATA: Short of breath, orthostatic hypotension

EXAM:
PORTABLE CHEST 1 VIEW

[chest]
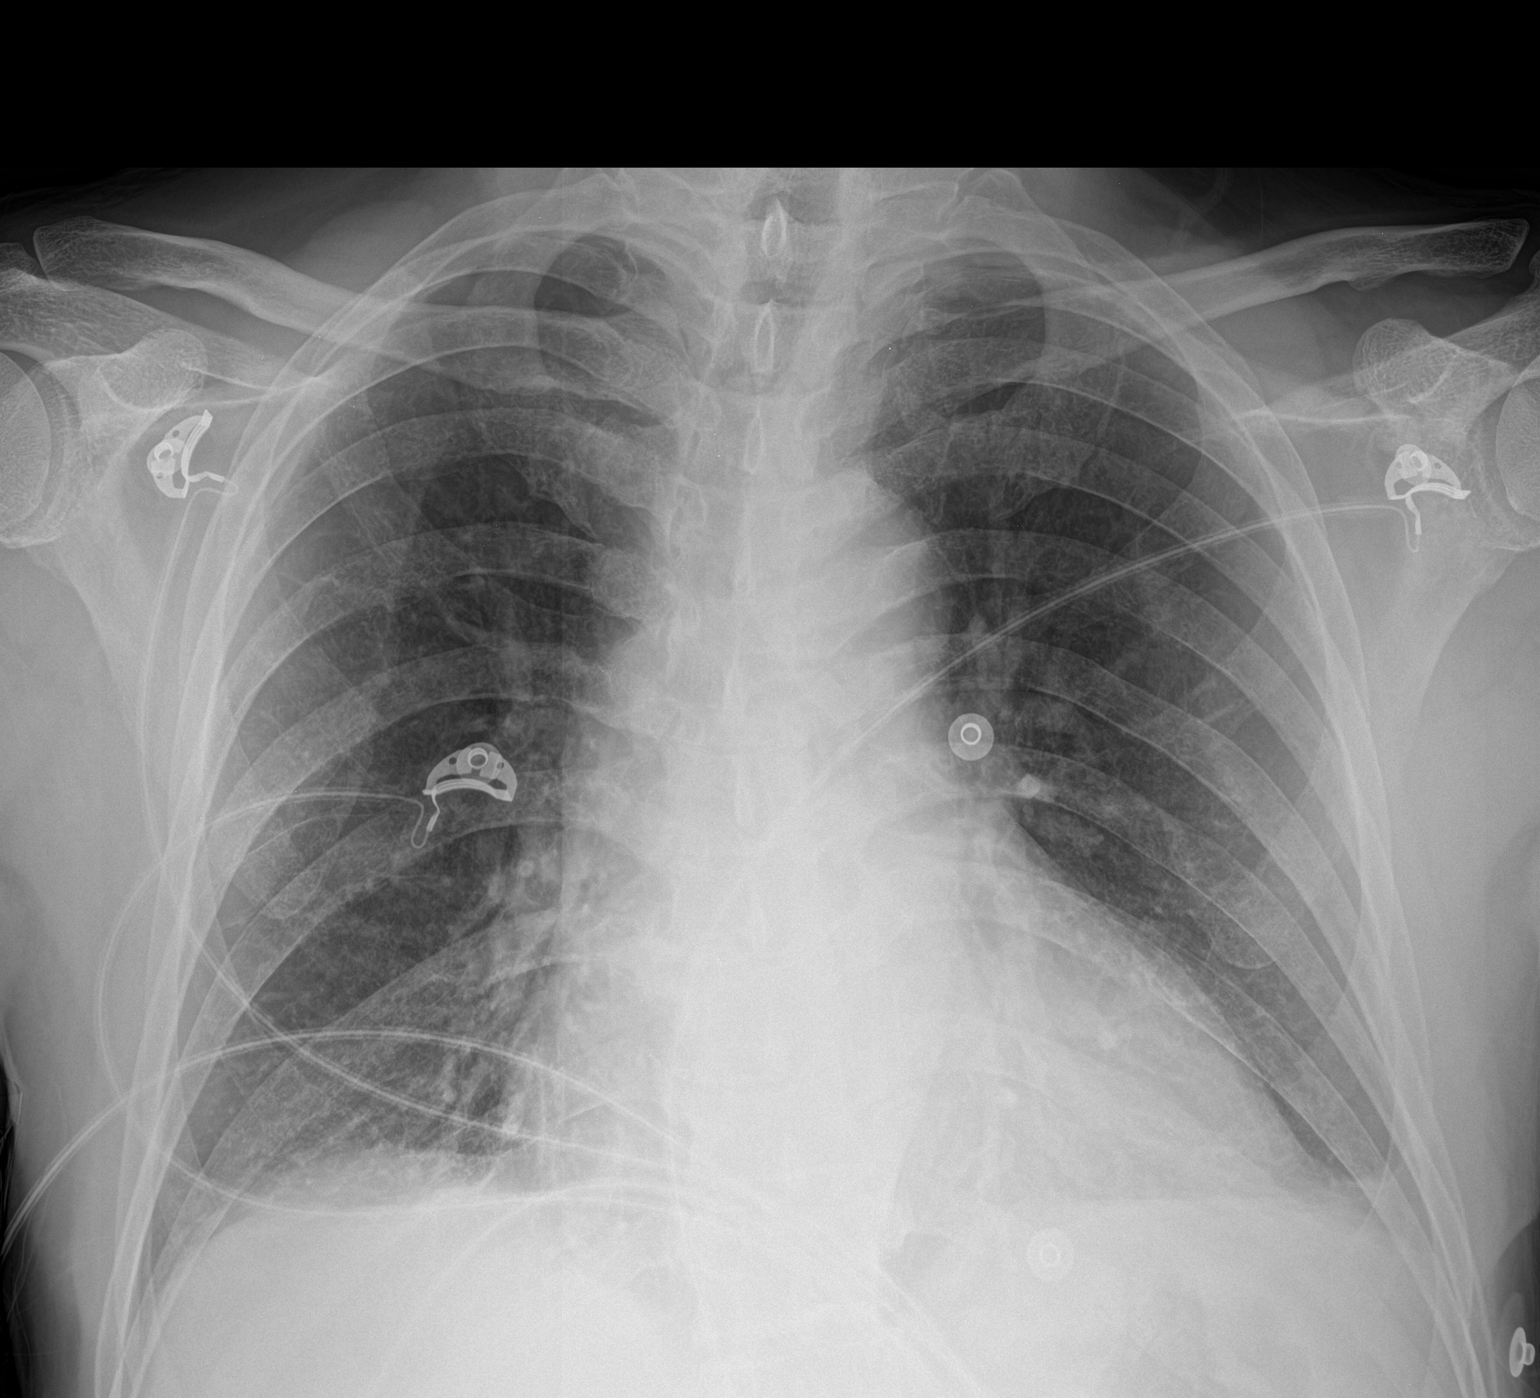

[1 of 1 positions shown; findings below may reference images not displayed]

FINDINGS: Single frontal view of the chest demonstrates stable enlargement of
the cardiac silhouette. Stable left basilar scarring. No airspace
disease, effusion, or pneumothorax. No acute bony abnormalities.
IMPRESSION: 1. No acute airspace disease.

## 2023-10-18 NOTE — Progress Notes (Signed)
 This encounter was created in error - please disregard.
